# Patient Record
Sex: Male | Born: 1950
Health system: Southern US, Community
[De-identification: ages and names within clinical notes are randomized; demographics above are authoritative.]

## PROBLEM LIST (undated history)

## (undated) DIAGNOSIS — B1920 Unspecified viral hepatitis C without hepatic coma: Secondary | ICD-10-CM

## (undated) DIAGNOSIS — I509 Heart failure, unspecified: Secondary | ICD-10-CM

## (undated) DIAGNOSIS — D126 Benign neoplasm of colon, unspecified: Secondary | ICD-10-CM

## (undated) DIAGNOSIS — E785 Hyperlipidemia, unspecified: Secondary | ICD-10-CM

## (undated) DIAGNOSIS — I4510 Unspecified right bundle-branch block: Secondary | ICD-10-CM

## (undated) DIAGNOSIS — I441 Atrioventricular block, second degree: Secondary | ICD-10-CM

## (undated) DIAGNOSIS — M1990 Unspecified osteoarthritis, unspecified site: Secondary | ICD-10-CM

## (undated) DIAGNOSIS — I472 Ventricular tachycardia, unspecified: Secondary | ICD-10-CM

## (undated) DIAGNOSIS — Z950 Presence of cardiac pacemaker: Secondary | ICD-10-CM

## (undated) DIAGNOSIS — K254 Chronic or unspecified gastric ulcer with hemorrhage: Secondary | ICD-10-CM

## (undated) DIAGNOSIS — I2119 ST elevation (STEMI) myocardial infarction involving other coronary artery of inferior wall: Secondary | ICD-10-CM

## (undated) DIAGNOSIS — K7200 Acute and subacute hepatic failure without coma: Secondary | ICD-10-CM

## (undated) DIAGNOSIS — K7460 Unspecified cirrhosis of liver: Secondary | ICD-10-CM

## (undated) DIAGNOSIS — I2510 Atherosclerotic heart disease of native coronary artery without angina pectoris: Secondary | ICD-10-CM

## (undated) DIAGNOSIS — F1010 Alcohol abuse, uncomplicated: Secondary | ICD-10-CM

## (undated) DIAGNOSIS — K219 Gastro-esophageal reflux disease without esophagitis: Secondary | ICD-10-CM

## (undated) DIAGNOSIS — I10 Essential (primary) hypertension: Secondary | ICD-10-CM

## (undated) HISTORY — DX: Ventricular tachycardia, unspecified: I47.20

## (undated) HISTORY — DX: Atrioventricular block, second degree: I44.1

## (undated) HISTORY — DX: Unspecified osteoarthritis, unspecified site: M19.90

## (undated) HISTORY — DX: Atherosclerotic heart disease of native coronary artery without angina pectoris: I25.10

## (undated) HISTORY — PX: TOTAL KNEE ARTHROPLASTY: SHX125

## (undated) HISTORY — DX: Heart failure, unspecified: I50.9

## (undated) HISTORY — DX: Benign neoplasm of colon, unspecified: D12.6

## (undated) HISTORY — DX: Ventricular tachycardia: I47.2

## (undated) HISTORY — PX: KNEE ARTHROSCOPY: SUR90

## (undated) HISTORY — DX: Unspecified right bundle-branch block: I45.10

## (undated) HISTORY — DX: ST elevation (STEMI) myocardial infarction involving other coronary artery of inferior wall: I21.19

## (undated) HISTORY — DX: Hyperlipidemia, unspecified: E78.5

## (undated) HISTORY — DX: Essential (primary) hypertension: I10

---

## 2001-10-23 ENCOUNTER — Emergency Department (HOSPITAL_COMMUNITY): Admission: EM | Admit: 2001-10-23 | Discharge: 2001-10-24 | Payer: Self-pay | Admitting: Emergency Medicine

## 2001-10-23 ENCOUNTER — Encounter: Payer: Self-pay | Admitting: Emergency Medicine

## 2001-12-25 ENCOUNTER — Encounter: Payer: Self-pay | Admitting: Emergency Medicine

## 2001-12-25 ENCOUNTER — Inpatient Hospital Stay (HOSPITAL_COMMUNITY): Admission: EM | Admit: 2001-12-25 | Discharge: 2001-12-26 | Payer: Self-pay | Admitting: *Deleted

## 2003-01-21 ENCOUNTER — Inpatient Hospital Stay (HOSPITAL_COMMUNITY): Admission: EM | Admit: 2003-01-21 | Discharge: 2003-01-28 | Payer: Self-pay | Admitting: Emergency Medicine

## 2003-01-21 ENCOUNTER — Encounter (INDEPENDENT_AMBULATORY_CARE_PROVIDER_SITE_OTHER): Payer: Self-pay | Admitting: *Deleted

## 2003-01-21 ENCOUNTER — Encounter: Payer: Self-pay | Admitting: Emergency Medicine

## 2003-02-08 ENCOUNTER — Encounter: Payer: Self-pay | Admitting: Cardiothoracic Surgery

## 2003-02-10 ENCOUNTER — Encounter: Payer: Self-pay | Admitting: Cardiothoracic Surgery

## 2003-02-10 ENCOUNTER — Inpatient Hospital Stay (HOSPITAL_COMMUNITY): Admission: RE | Admit: 2003-02-10 | Discharge: 2003-02-19 | Payer: Self-pay | Admitting: Cardiothoracic Surgery

## 2003-02-10 HISTORY — PX: CORONARY ARTERY BYPASS GRAFT: SHX141

## 2003-02-10 HISTORY — PX: MEDIASTINAL EXPLORATION: SHX5065

## 2003-02-11 ENCOUNTER — Encounter: Payer: Self-pay | Admitting: Cardiothoracic Surgery

## 2003-02-12 ENCOUNTER — Encounter: Payer: Self-pay | Admitting: Cardiothoracic Surgery

## 2003-02-13 ENCOUNTER — Encounter: Payer: Self-pay | Admitting: Cardiothoracic Surgery

## 2003-02-15 ENCOUNTER — Encounter: Payer: Self-pay | Admitting: Thoracic Surgery (Cardiothoracic Vascular Surgery)

## 2003-02-17 ENCOUNTER — Encounter: Payer: Self-pay | Admitting: Cardiothoracic Surgery

## 2003-02-18 ENCOUNTER — Encounter: Payer: Self-pay | Admitting: Cardiothoracic Surgery

## 2003-05-27 ENCOUNTER — Encounter: Admission: RE | Admit: 2003-05-27 | Discharge: 2003-08-05 | Payer: Self-pay | Admitting: Cardiothoracic Surgery

## 2003-10-16 DIAGNOSIS — I2119 ST elevation (STEMI) myocardial infarction involving other coronary artery of inferior wall: Secondary | ICD-10-CM

## 2003-10-16 HISTORY — DX: ST elevation (STEMI) myocardial infarction involving other coronary artery of inferior wall: I21.19

## 2003-11-02 ENCOUNTER — Ambulatory Visit (HOSPITAL_COMMUNITY): Admission: RE | Admit: 2003-11-02 | Discharge: 2003-11-02 | Payer: Self-pay | Admitting: *Deleted

## 2003-11-02 ENCOUNTER — Encounter (INDEPENDENT_AMBULATORY_CARE_PROVIDER_SITE_OTHER): Payer: Self-pay | Admitting: *Deleted

## 2004-03-03 ENCOUNTER — Inpatient Hospital Stay (HOSPITAL_COMMUNITY): Admission: EM | Admit: 2004-03-03 | Discharge: 2004-03-11 | Payer: Self-pay | Admitting: *Deleted

## 2004-03-03 ENCOUNTER — Encounter (INDEPENDENT_AMBULATORY_CARE_PROVIDER_SITE_OTHER): Payer: Self-pay | Admitting: Cardiovascular Disease

## 2004-03-06 HISTORY — PX: CARDIAC CATHETERIZATION: SHX172

## 2004-03-20 ENCOUNTER — Inpatient Hospital Stay (HOSPITAL_COMMUNITY): Admission: AD | Admit: 2004-03-20 | Discharge: 2004-03-23 | Payer: Self-pay | Admitting: *Deleted

## 2004-06-08 ENCOUNTER — Ambulatory Visit (HOSPITAL_COMMUNITY): Admission: RE | Admit: 2004-06-08 | Discharge: 2004-06-08 | Payer: Self-pay | Admitting: Gastroenterology

## 2004-10-30 ENCOUNTER — Ambulatory Visit: Payer: Self-pay | Admitting: Gastroenterology

## 2005-01-18 ENCOUNTER — Ambulatory Visit: Payer: Self-pay | Admitting: Gastroenterology

## 2005-04-26 ENCOUNTER — Ambulatory Visit: Payer: Self-pay | Admitting: Internal Medicine

## 2005-05-24 ENCOUNTER — Ambulatory Visit: Payer: Self-pay | Admitting: Gastroenterology

## 2005-05-30 ENCOUNTER — Ambulatory Visit (HOSPITAL_COMMUNITY): Admission: RE | Admit: 2005-05-30 | Discharge: 2005-05-30 | Payer: Self-pay | Admitting: Obstetrics and Gynecology

## 2005-06-21 ENCOUNTER — Ambulatory Visit: Payer: Self-pay | Admitting: Internal Medicine

## 2005-07-19 ENCOUNTER — Ambulatory Visit: Payer: Self-pay | Admitting: Gastroenterology

## 2007-01-20 HISTORY — PX: US ECHOCARDIOGRAPHY: HXRAD669

## 2007-05-12 ENCOUNTER — Emergency Department (HOSPITAL_COMMUNITY): Admission: EM | Admit: 2007-05-12 | Discharge: 2007-05-12 | Payer: Self-pay | Admitting: Emergency Medicine

## 2009-04-05 HISTORY — PX: NM MYOCAR PERF WALL MOTION: HXRAD629

## 2010-03-15 HISTORY — PX: INSERT / REPLACE / REMOVE PACEMAKER: SUR710

## 2010-03-28 ENCOUNTER — Inpatient Hospital Stay (HOSPITAL_COMMUNITY): Admission: RE | Admit: 2010-03-28 | Discharge: 2010-03-29 | Payer: Self-pay | Admitting: Cardiology

## 2011-01-01 LAB — CBC
HCT: 46.3 % (ref 39.0–52.0)
Hemoglobin: 16 g/dL (ref 13.0–17.0)
MCV: 99 fL (ref 78.0–100.0)
Platelets: 55 10*3/uL — ABNORMAL LOW (ref 150–400)
WBC: 3.7 10*3/uL — ABNORMAL LOW (ref 4.0–10.5)

## 2011-01-01 LAB — PROTIME-INR: Prothrombin Time: 16.3 seconds — ABNORMAL HIGH (ref 11.6–15.2)

## 2011-01-01 LAB — BASIC METABOLIC PANEL
BUN: 5 mg/dL — ABNORMAL LOW (ref 6–23)
CO2: 24 mEq/L (ref 19–32)
Creatinine, Ser: 0.75 mg/dL (ref 0.4–1.5)
GFR calc non Af Amer: >60 mL/min (ref >60)
Glucose, Bld: 97 mg/dL (ref 70–99)
Sodium: 137 mEq/L (ref 135–145)

## 2011-01-01 LAB — SURGICAL PCR SCREEN: MRSA, PCR: NEGATIVE

## 2011-03-02 NOTE — Discharge Summary (Signed)
Hunter Carter, Hunter Carter                        ACCOUNT NO.:  [id number]   MEDICAL RECORD NO.:  [id number]                   PATIENT TYPE:  INP   LOCATION:  4729                                 FACILITY:  [facility]   PHYSICIAN:  Darlin Priestly, M.D.             DATE OF BIRTH:  09/13/1951   DATE OF ADMISSION:  01/21/2003  DATE OF DISCHARGE:  01/28/2003                                 DISCHARGE SUMMARY   ADMISSION DIAGNOSES:  1. Hypertensive crisis.  2. Unstable angina.  3. Congestive heart failure.  4. Medical noncompliance secondary to finances.  5. Thrombocytopenia.  6. History of alcoholism.  7. History of knee surgery x3.  8. History of cigarette smoking.   DISCHARGE DIAGNOSES:  1. Hypertensive crisis.  2. Unstable angina.  3. Congestive heart failure.  4. Medical noncompliance secondary to finances.  5. Thrombocytopenia.  6. History of alcoholism.  7. History of knee surgery x3.  8. History of cigarette smoking.  9. Status post cardiac catheterization by Dr. Lenise Herald on January 22, 2003, revealing high grade multivessel coronary artery disease and     ejection fraction 25 to 30%.  10.      High grade multivessel coronary artery disease status post cardiac     catheterization on January 22, 2003, by Dr. Lenise Herald.  Status post     cardiovascular thoracic surgery consultation by Dr. [name] on January 22, 2003, with plans for upcoming outpatient coronary artery bypass     grafting.  11.      Severe left ventricular dysfunction. Ejection fraction is 25 to     30%.  12.      Congestive heart failure felt to be secondary to uncontrolled     hypertension as well as ischemic mediated.  As well, there may be an     alcoholic component.  13.      Status post hematology consultation for evaluation of     thrombocytopenia.  Felt that it may have been secondary to sensitivity to     heparin.  It was felt to be stable.  Platelet count improved off of     heparin.  14.      Status post hepatology screen that was significant for positive     anti-HCV.  15.      History of alcoholism.  16.      Elevated liver function tests with an AST of 70 and ALT of 66.   HISTORY OF PRESENT ILLNESS:  The patient is a 60 year old African American  male who has a history of hypertension and history of heart failure but no  past coronary history.  He presented to the emergency room on January 21, 2003,  with complaints of shortness of breath and cough.  Apparently he had been  treated for bronchitis and then developed acute onset of shortness of breath  and  chest tightness with physical exertion.   Apparently, on the prior day, he had done a lot of yard work but was feeling  well at that time.  Later that evening, he had to chase his niece's dog that  had run away.  Apparently after chasing the dog, he developed severe  shortness of breath/dyspnea on exertion and chest tightness.   He subsequently came to the emergency room.  Original blood pressure had  been 214/138.  Heart rate was in the 90s to 100s.  Oxygen saturation was 96%  on room air.  He had very diminished breath sounds at the bases bilaterally.  EKG showed marked LVH with repolarization changes.  However, we could not  exclude ischemia versus repolarization change.  Subsequently his CK was  elevated and his troponin was mildly elevated.  At that time, he was seen  and evaluated by Dr. Lenise Herald.  He was planned for IV heparin, IV  nitroglycerin, aspirin, as well as medications to treat his severe  hypertension and plan for cardiac catheterization.  Risks and benefits of  the procedure were discussed and the patient was willing to proceed.   On January 22, 2003, the patient underwent cardiac catheterization by Dr.  Lenise Herald.  See the dictated report for details.  The patient was found  to have high grade multivessel coronary artery disease.  The EF was 25 to  30% with global hypokinesis.  There was  no renal artery stenosis.  At that  time, Dr. Jenne Campus obtained CVTS consultation for possible CABG.  He started  fenoldopam for further blood pressure control.  He changed Lopressor to  Coreg for both blood pressure control as well as treatment of his heart  failure and EF of 25%.  As well, he added digoxin given the LV dysfunction.   On January 22, 2003, the patient was seen in CVTS consultation by Dr. [name].  He did feel that CABG was a good option for treatment.  It was  noted that the patient had a history of thrombocytopenia of undetermined  etiology and he felt that we needed hematology consultation for evaluation  of thrombocytopenia prior to any plans for CABG.   On January 24, 2003, the patient was seen in hematology consultation by Dr.  Aliene Altes.  At that point, it was felt that alcohol abuse was the primary  etiology for the thrombocytopenia.  Subsequently, he wanted to rule out any  infection such as HIV and hepatitis.  In the meantime, he planned to avoid  all heparin products for now.  It was felt that surgical bleeding would be  minimized with platelet count greater than 100,000 but at that point would  not recommend steroid therapy for any possible autoimmune process given the  history of alcohol abuse as the main culprit.  He will continue to follow  until the issue resolved.  Please note that at that time the platelet count  had been the following.  On January 21, 2003, on admission, the platelet count  was 78,000.  On January 22, 2003, it was 82,000.  On January 23, 2003, it was  76,000.  On January 24, 2003, it was 98,000.  Subsequently, on January 25, 2003,  it came up to 109,000.  Subsequently, the HIV test was nonreactive.  The  hepatitis screen was positive for hepatitis C.   Over the next couple of days the patient continued to do well.  We continued  to adjust medications for  further blood pressure control.  On January 25, 2003, the blood pressures were running between  130 to 150 systolically and  80/80 to 104 diastolically so medications were further adjusted.  As well,  we continued to give him some increased diuretics for his heart failure.   Over the next days, the Lasix had been changed to p.o.  His blood pressure  was much better controlled.   We continued to improve these issues so that he would be medically stable to  undergo coronary artery bypass grafting.   Ultimately, we recontacted the cardiovascular thoracic surgeons.  It was  decided that the patient had been stable with no further chest pain  throughout the remainder of the hospitalization and that he would be stable  for discharge home and to return for coronary artery bypass grafting in the  very near future (probably next week).  The scheduler at CVTS is to contact  the patient with follow-up arrangement for return for CABG surgery.   Finally, by January 28, 2003, he is otherwise stable and is deemed stable for  discharge at this time by Dr. Susa Griffins.  At this point, he is  afebrile at 97.5, pulse 70, blood pressure 135/75, oxygen saturation 94% on  room air.  Labs are stable.  He has maintained sinus rhythm with no  arrhythmias.  It is felt that his blood pressure is well controlled.  His  heart failure is essentially resolved clinically.  His thrombocytopenia has  stabilized.  He will be discharged home with follow-up arrangements for  CVTS.   CONSULTATIONS:  1. Cardiovascular thoracic surgery consultation by Dr. [name] on January 22, 2003, for evaluation of high grade multivessel coronary artery disease     with need for coronary artery bypass grafting.  Please see above for his     other consultation details.  2. Hematology consultation by Dr. Aliene Altes on January 24, 2003, for     evaluation of thrombocytopenia.  It was felt that his past alcohol abuse     remained the primary etiology for the thrombocytopenia.  However, he did     want to rule out any  infection such HIV or hepatitis.  As well, he     planned to avoid all heparin products at this point.  Notably the HIV     became nonreactive.  Hepatitis screen was positive for hepatitis C.   PROCEDURES:  1. Cardiac catheterization on December 22, 2002, by Dr. Lenise Herald revealing     multivessel high grade coronary artery disease.  EF 25 to 30% with global     hypokinesis.  No renal artery stenosis.  See the catheterization report     for details.  2. January 25, 2003, he underwent carotid Duplex.  This shows bilateral mild     heterogenous plaque of the common carotid artery extending to the origins     of the ICA and ECA.  No evidence of ICA stenosis.  Vertebral artery flow     antegrade.  The upper extremity showed PPD wave forms diminished     approximately 50% with radial compression and remained normal with ulnar     compression on the right.  Wave forms diminished greater than 50% with    radial compression, remained normal with ulnar compression on the left.  3. ABIs and Doppler wave forms were normal at rest.   LABORATORY DATA:  HIV is nonreactive.  Hepatitis panel shows hepatitis B  surface antigen negative, anti-HCV is positive, anti-HAV IgM is negative,  hepatitis B negative.  On admission cardiac enzymes showed CK 715, 566; CK-  MB 7.9, 7.1; troponin 0.10 and 0.06.  B natriuretic peptide was 325.0 on  admission.  On admission, liver function tests were normal except he did  have an AST elevated at 70 and an ALT elevated at 56.   On admission, sodium 136, potassium 3.1 which was repleted up to 3.6,  glucose 109, BUN 13, creatinine 1.1.  These all remained stable throughout  the hospitalization.  He had no rise in creatinine even post catheterization  dye.  On admission white count was 4.3, hemoglobin 15.6, hematocrit 45.5,  platelets 78.  On January 22, 2003, platelets are 68.  On January 23, 2003,  platelets are 28.  On January 24, 2003, platelets are 98.  On January 25, 2003,   platelets are 109.  The remainder of the CBC __________ the hospitalization.   RADIOLOGY:  Chest x-ray on January 21, 2003, shows cardiomegaly with probably  mild interstitial edema, no effusion.   A 2-D echocardiogram  on January 21, 2003, shows  EF 40 to 45%.  Trivial aortic  regurgitation.   EKG on admission shows normal sinus rhythm at 78 beats per minute with  severe LVH.  There were changes that could possibly be repolarization  changes but could not rule out ischemic changes.   DISCHARGE MEDICATIONS:  1. Enteric coated aspirin 325 mg once a day.  2. Digoxin 0.125 mg once a day.  3. Altace 10 mg twice a day.  4. Norvasc 10 mg once a day.  5. Lasix 40 mg once a day.  6. K-Dur 40 mEq once a day.  7. Coreg 6.25 mg twice a day.  8. Imdur 30 mg once a day.  9. Nitroglycerin 0.4 mg sublingual as directed for chest pain.  10.      At this time we were going to treat him with statin therapy but I     am actually not going to give him any statin therapy at this time given     his elevated liver function tests.   At this time the case managers and myself have spent an extensive amount of  time trying to work on his plans for medication compliance and financial  issues at the time of discharge home.  He is going to our office to pick up  the samples that we have.  Case manager is giving him $50 worth of medicines  here at the discharge.  He is to follow up with the [facility] and with  medication assistance programs.   If he has any significant chest pain, he is to either call [phone] or go  directly to [facility] Emergency Room.  As well, he has been given  nitroglycerin and has been instructed on proper use of that.   He is to have no strenuous activity until he has his bypass surgery.  The  cardiovascular thoracic surgeons office is to contact him with follow-up  plans regarding his upcoming bypass surgery.  I have given him their office phone number to contact in the event that he  does not hear from them in the  next day.   As well, I have spoken with Dr. Mikey Bussing nurse at the office and she is to  contact the patient early next week to make sure that he does have follow-up  arrangements made for bypass surgery.  As well,  to be sure that he  definitely has follow-up, I have made him an appointment for Feb 23, 2003,  at 12:15 to see Dr. Jenne Campus.     Mary B. Easley, P.A.-C.                   Darlin Priestly, M.D.    MBE/MEDQ  D:  01/28/2003  T:  01/28/2003  Job:  [id number]   cc:   [name], M.D.  [address]  [place]  [place] [address]  Fax: [phone]   Mikey Bussing, M.D.  [address]  [place]  [place] [address]  Fax: [phone]   [facility]

## 2011-03-02 NOTE — Cardiovascular Report (Signed)
Hunter Carter, Hunter Carter                        ACCOUNT NO.:  [id number]   MEDICAL RECORD NO.:  [id number]                   PATIENT TYPE:  INP   LOCATION:  4727                                 FACILITY:  [facility]   PHYSICIAN:  Hunter Carter, M.D.             DATE OF BIRTH:  [birth year]   DATE OF PROCEDURE:  03/08/2004  DATE OF DISCHARGE:                              CARDIAC CATHETERIZATION   PROCEDURES:  Dual-chamber pacemaker implant using a Medtronic Enrhythm  generator with active atrial and passive ventricular leads.   ATTENDING PHYSICIAN:  Hunter Carter, M.D.   ASSISTANT:  Hunter Carter, M.D.   INDICATIONS:  Hunter Carter is a 60 year old male with history of ischemia  cardiomyopathy recently admitted with unstable angina.  The patient has a  history of bypass surgery in April 2004 consisting of LIMA to LAD, vein  graft to diagonal and jump graft from the vein graft to the diagonal to an  obtuse marginal.  He also has a history of hepatitis C and alcohol abuse as  well as __________.  He was admitted on Mar 03, 2004 with increasing  shortness of breath and positive enzymes.  Subsequent cardiac  catheterization revealed totally occluded vein graft to the obtuse marginal.  We have elected to treat this medically.  He has had intermittent secondary  heart block during this hospitalization prompting [place] to hold many of his  antiischemic medications.  He is now referred for dual-chamber pacemaker  implant.   DESCRIPTION OF OPERATION:  After obtaining informed written consent, the  patient was brought to the cardiac catheterization laboratory in fasting  state.  The left anterior chest wall was then shaved, prepped and draped in  sterile fashion.  ECG monitor was established.  1% lidocaine was then used  to anesthetize the left subclavian region.  Next, approximately a 3 cm  incision was then performed in the midclavicular line beneath the left  clavicle.  Hemostasis was  obtained with electrocautery.  Blunt dissection  was used to carry this down to the left pectoral fascia.  Approximately 3-4  cm pocket was then created over the left pectoral fascia and again  hemostasis was obtained with electrocautery.  We then were able to easily  enter the left subclavian vein with single stick with easy passage of a  guide wire into the SVC and right atrium.  Next, a #9 [place] dilator and  sheath were then easily inserted over the retained guide wire and the guide  wire and dilator then removed.  Through the 9 French sheath a 52 cm  Medtronic ventricular lead model number [id number], serial number [id number] lead  was then easily passed into the right atrium and the guide wire was then  retained.  The peel-away sheath removed.  A second 9 French dilator and  sheath were then placed over the retained guide wire and the guide wire and  dilator were removed.  A second 45 cm Medtronic active atria lead model  number [id number], serial number [id number] was then easily passed into the right  atrium and the guide wire was retained and peel-away sheath removed.  The  guide wire was then anchored to the sheath with hemostat.  A J-curve was  then placed on the ventricular lead stylet and the ventricular lead was then  allowed to prolapse into the RA apex.  We did do multiple threshold testings  in the RV apex.  However, impedances were running consistently above 1000  with thresholds approximately 5 V.  Ultimately, we were able to select a  position just beneath the tricuspid valve which appeared stable.  R waves in  this location were approximately 12.6 mV.  Impedance was 772 ohms.  Thresholds were 0.3 V at 0.5 msec.  Current was 0.5 milliamps.  There was  negative diaphragmatic stimulation at 10 V.  Next, a preformed atrial stylet  was then placed in the atrial lead and the atrial lead was then positioned  in the right atrial appendage and the screw was then extended and confirmed.  P  waves were measured at 5.6 mV.  Threshold in the atrium is 1.2 V, 0.5  msec.  Impedance was 621 ohms.  Current was 1.9 milliamps.  There was no  diaphragmatic stimulation at 10 V.  Each of these lesions were then sutured  into place with two silk sutures per lead.  A single 2.0 silk suture was  then placed in the apex of the pocket for anchoring the generator.  Again,  hemostasis was confirmed with electrocautery.  The pocket was then copiously  irrigated with 1% kanamycin solution.  The generator was then connected in  serial fashion to an Enrhythm [id number] Medtronic generator serial [id number]  and head screws were then tightened on the atrial ventricular leads and  pacing was confirmed.  The generator and leads were then delivered into the  pocket without complication.  The generator was then sutured in place using  the previously placed suture.  All remaining wires were removed.  The  subcutaneous tissue was then closed using running 2-0 Vicryl.  The skin  layers were then closed using running 5-0 Vicryl.  Steri-Strips were then  applied.  The patient transferred to recovery room in stable condition.   CONCLUSION:  Successful implant of a Medtronic Enrhythm [id number] generator,  serial number [id number] with active atrial and passive ventricular  Medtronic leads.                                               Hunter Carter, M.D.    RHM/MEDQ  D:  03/10/2004  T:  03/11/2004  Job:  [id number]   cc:   Hunter Carter, M.D.  [phone], [address]  [place]  [place] [address]  Fax: [phone]

## 2011-03-02 NOTE — Cardiovascular Report (Signed)
NAMEJOHNSTON, MADDOCKS                        ACCOUNT NO.:  [id number]   MEDICAL RECORD NO.:  [id number]                   PATIENT TYPE:  INP   LOCATION:  2013                                 FACILITY:  [facility]   PHYSICIAN:  Darlin Priestly, M.D.             DATE OF BIRTH:  01-Apr-1951   DATE OF PROCEDURE:  03/21/2004  DATE OF DISCHARGE:                              CARDIAC CATHETERIZATION   PROCEDURES:  Pacer pocket revision with pacer interrogation.   ATTENDING PHYSICIAN:  Darlin Priestly, M.D.   COMPLICATIONS:  None.   INDICATIONS:  Mr. Vanvranken is a 60 year old male with history of coronary  artery disease status post coronary artery bypass surgery with history of  intermittent secondary AV block.  The patient underwent permanent pacemaker  placement on Mar 10, 2001 by Dr. Alanda Amass and Dr. Jenne Campus which was  uncomplicated. He was seen back in the office on March 20, 2004 with  increasing swelling and tenderness at the pacer site.  There was no evidence  of inflammation though he was having minimal slow oozing from the incision  site.  He was subsequently admitted for planned pocket exploration and  hematoma evacuation.   DESCRIPTION OF OPERATION:  After obtaining informed written consent, the  patient was brought to the cardiac catheterization laboratory in fasting  state.  Left anterior chest was then shaved, prepped and draped in sterile  fashion.  1% Lidocaine was then used to anesthetize the previous incision  site.  The sutures were then cut and blunt dissection was used to dissect  down to the pocket.  The previously placed 2-0 and 5-0 sutures were then  removed.  A moderate amount of dark hematoma was then expressed from the  pocket. The generator was then freed and the retaining sutures then removed.  The generator was then delivered from the pocket.  Again, moderate amount of  hematoma was expressed from beneath the previous pacer site.  The pocket was  then swabbed  for gram stain, culture and sensitivity and this was sent to  the lab.  The pocket was then copiously irrigated with 1% kanamycin solution  and was again inspected.  There was no obvious active bleeding site though  there was persistent slow ooze from the pocket.  We then anesthetized the  entire pocket as well as the pectoral area with 1% lidocaine with  epinephrine.  We also injected 5000 units of thrombin topically to allow for  hemostasis.  This was then removed.  Again, the pocket was inspected and  appeared to be dry.  The pacer was then interrogated while it was removed  from the pocket.  Atrial lead impedance was 528 ohms.  Ventricular lead  impedance was 480 ohms.  P waves were measured at 7.1 mV.  R waves were  approximately 9 mV.  Threshold in the atrium was 0.5 V at 0.5 milliseconds.  Threshold in the ventricle was 0.5 V  at 0.4 milliseconds.  The generator was  then delivered back into the pocket, and again, hemostasis was confirmed.  The subcutaneous layer was then closed using running 2-0 Vicryl.  The skin  layer was then closed using subcutaneous  5-0 running Vicryl.  Steri-Strips were then applied.  Pressure dressing was  then applied to the pacer site and the patient was then removed from the  table and transferred to recovery room in stable condition.   CONCLUSION:  Successful pacemaker pocket revision with removal of moderate  amount of thrombus.  There was no evidence of infection or pus in the  pocket.                                               Darlin Priestly, M.D.   RHM/MEDQ  D:  03/21/2004  T:  03/22/2004  Job:  [id number]

## 2011-03-02 NOTE — Discharge Summary (Signed)
. [facility]  Patient:    Hunter Carter, Hunter Carter Visit Number: [id number] MRN: [id number]          Service Type: MED Location: [phone] Attending Physician:  [place], Peter Manning Dictated by:   Peter M. [place], M.D. Admit Date:  12/25/2001 Discharge Date: 12/26/2001   CC:         [name], M.D., [facility]   Discharge Summary  HISTORY OF PRESENT ILLNESS:  The patient is a 60 year old black male with a history of severe hypertension who presents with a 1-week history of cough (nonproductive) with intermittent fever.  The patient reports that his cough has been so severe that he has passed out five times while coughing.  [facility] where chest x-ray showed a question of congestive heart failure. He has no prior cardiac disease.  He reports having a stress Cardiolite study 4-5 years ago which was normal.  He has no chest pain.  The patient received nebulizer therapy in the ambulance and coughed up thick yellowish-green sputum.  His past medical history is significant for hypertension (poorly controlled).  He has had prior knee surgery.  He has no known allergies. The patient has been a smoker approximately 1 pack per week.  He does have family history of vascular disease.  PAST MEDICAL HISTORY/SOCIAL HISTORY/FAMILY HISTORY AND PHYSICAL EXAMINATION: Please see admission history and physical.  LABORATORY DATA:  Chest x-ray shows increased bronchial markings consistent with bronchitis and reactive airway disease.  ECG shows normal sinus rhythm with LVH and prominent repolarization abnormality and left atrial enlargement. White count was 5800, hemoglobin 15.3, hematocrit 44.9, platelets were at 73,000.  Chemistries were unremarkable except for a potassium of 3.3.  CPK was 1727 with 3.1 MB, troponin was 0.11.  Urinalysis was benign.  HOSPITAL COURSE:  The patient was admitted.  He was treated with nebulizer therapy with q.6h.  Atrovent and albuterol nebulizers.  He was started on guaifenesin and L.A.  He was begun on antibiotics with Zithromax.  The patient had improvement in his wheezing and cough.  He did have one episode where he felt like he passed out while coughing but telemetry showed no arrhythmia.  He was treated with Altace and clonidine for his severe hypertension, had significant improvement in his blood pressure which was normal at time of discharge.  ECG showed improvement in his repolarization abnormality. Subsequent CPKs were 1203 with 2.5 MB and 1065, troponin declined to 0.07. These enzymes were felt to be more consistent with skeletal muscle injury probably due to his severe coughing.  An echocardiogram was obtained which demonstrated moderate concentric left ventricular hypertrophy with normal left ventricular systolic function.  There was moderate left atrial enlargement. Valvular function was normal.  Repeat CBC the following day showed persistence of thrombocytopenia with platelet count of 74,000, white count was 4400.  His BUN was 12, creatinine 1.2, glucose 109.  Brain natriuretic peptide was normal at 57.  TSH was normal at 0.593.  Total cholesterol was 154, HDL of 29, LDL 108, triglycerides 84.  With aggressive pulmonary treatment, the patients breathing improvement, his wheezing had largely resolved although he continued to have significant congestion on exam. It was felt that his symptoms were clearly noncardiac related.  His blood pressure was well controlled.  We did replete his potassium prior to discharge.  At the time of discharge he still had a sputum culture pending and SPEP.  At this point it was felt the patient was  stable for discharge and he was discharged to home.  DISCHARGE DIAGNOSES: 1. Acute bronchitis. 2. Acute bronchospasm secondary to #1. 3. Severe hypertension. 4. Left ventricular hypertrophy. 5. Thrombocytopenia of unknown etiology. 6. Tobacco abuse. 7. Cough  mediated syncope.  DISCHARGE MEDICATIONS: 1. Combivent inhaler two puffs q.i.d. 2. Humibid L.A. 600 mg twice a day. 3. Zithromax 250 mg daily for 4 days. 4. Altace 5 mg daily. 5. Clonidine 0.2 mg twice a day.  DISCHARGE INSTRUCTIONS:  The patient is to resume light activity only, follow a low-salt, low-fat diet.  He is told not to smoke.  Recommended followup with Dr. Salvadore Farber next week who can follow up on his bronchitis and thrombocytopenia as an outpatient.  No further cardiac evaluation is needed.  DISCHARGE STATUS:  Improved. Dictated by:   Peter M. [place], M.D. Attending Physician:  [place], Peter Manning DD:  12/26/01 TD:  12/29/01 Job: [id number]

## 2011-03-02 NOTE — Consult Note (Signed)
NAME:  Hunter Carter, Hunter Carter                        ACCOUNT NO.:  [id number]   MEDICAL RECORD NO.:  [id number]                   PATIENT TYPE:  INP   LOCATION:  4729                                 FACILITY:  [facility]   PHYSICIAN:  Merilynn Finland, M.D.                DATE OF BIRTH:  01-Jun-1951   DATE OF CONSULTATION:  01/24/2003  DATE OF DISCHARGE:                                   CONSULTATION   HISTORY OF PRESENT ILLNESS:  Ms. Hellinger is a very pleasant 60 year old  gentleman who was admitted 01/21/2003, in hypertensive crisis with a history  of congestive heart failure and some element of noncompliance with anti-  hypertensive medications and dietary restrictions presenting mainly with  shortness of breath with some element of mild chest tightness that was  nonradiating.  He was worked up extensively and at the time of this  admission was found to have a blood pressure of 195/127.  At the time of his  admission, he was found to be thrombocytopenic with platelet counts around  80,000.  He has a known alcohol use history and the consult today is for  thrombocytopenia in the setting of coronary artery disease with need for  revascularization procedure.   PAST MEDICAL HISTORY:  1. Hypertension.  2. Congestive heart failure.  3. History of alcohol abuse with elevated transaminases.  4. Hyperkalemia.  5. Status post knee surgery x3.  6. History of cigarette smoking.   FAMILY HISTORY:  Positive for coronary artery disease.   SOCIAL HISTORY:  Alcohol abuse and tobacco abuse.   ALLERGIES:  No known allergies.   MEDICATIONS:  Altace and clonidine for high blood pressure but he has been  noncompliant.   REVIEW OF SYSTEMS:  Shortness of breath, mild chest pain.  No bowel or  bladder changes.  No focal neurological complaints.  No history of bleeding.   PHYSICAL EXAMINATION:  VITAL SIGNS:  He is currently afebrile.  Vital signs  are stable.  CHEST:  Clear.  HEART:  Regular rate and  rhythm.  ABDOMEN:  Soft, nontender, no hepatosplenomegaly.  EXTREMITIES:  Range of motion normal.  No edema.  NEUROLOGICAL:  Alert and oriented x3.  Cranial nerves II-XII grossly intact  globally.   LABORATORY DATA:  Labs show initially a platelet count around 80,000 with a  subsequent drop to 76,000 with heparin administered, currently off heparin.  He has rebounded to 98,000.  On smear review, he has some cytopenia.  There  are no other abnormalities noted.  Other labs showed a white count of 5.7,  hemoglobin of 15.0.  His admission transaminases showed an SGOT of 70, SGPT  66.   IMPRESSION:  Thrombocytopenia in this 60 year old gentleman with a history  of alcohol abuse and severe hypotension.  At this point, I think alcohol  abuse remains his primary etiology for the thrombocytopenia.  Certainly, we  want to rule out infection such  as HIV and hepatitis.  In the meantime, I  would avoid all heparin products for now.  Surgical bleeding will be  minimized with platelets greater than 100,000 and at this point would not  recommend steroid therapy for any possible autoimmune process given the  history of alcohol abuse as the main culprit.  We will continue to follow  closely until the issue is resolved.                                               Merilynn Finland, M.D.    JSS/MEDQ  D:  01/24/2003  T:  01/24/2003  Job:  [id number]

## 2011-03-02 NOTE — H&P (Signed)
[place]  Patient:    Hunter Carter, Hunter Carter Visit Number: [id number] MRN: [id number]          Service Type: MED Location: [phone] Attending Physician:  [place], Peter Manning Dictated by:   Peter M. [place], M.D. Admit Date:  12/25/2001   CC:         [name], M.D.   History and Physical  CHIEF COMPLAINT: Shortness of breath and cough.  HISTORY OF PRESENT ILLNESS: Hunter Carter is a 60 year old black male, with a history of severe hypertension, who presents with a one week history of a nonproductive cough and fever.  He has had no chills.  His cough has been fairly extreme at times and so severe that the patient has passed out on five occasions this week while coughing.  The patient went to [facility] today and chest x-ray showed question of congestive heart failure, and was referred to the emergency room.  The patient has no known prior history of cardiac disease.  He has had poorly controlled hypertension.  He stopped taking his blood pressure medicine for several years, taking herbal until more recently he was started on Clonidine and Altace approximately three months ago.  The patient does report that he had a negative stress Cardiolite study four to five years ago.  He has had no chest pain.  After receiving nebulizer therapy in the ambulance the patient is now coughing up thick yellowish/green sputum.  PAST MEDICAL HISTORY:  1. Severe hypertension.  2. Status post knee surgery x3.  ALLERGIES: None known.  CURRENT MEDICATIONS:  1. Altace 2.5 mg q.d.  2. Clonidine, unknown dose.  3. Also on a cough medicine with codeine.  SOCIAL HISTORY: The patient previously worked for a [employer].  He has been unemployed for six months.  He smoke one pack per week.  He denies alcohol use.  FAMILY HISTORY: Brother died of a heart aneurysm and cancer.  Father had hypertension.  Sister died with some typical of aneurysm, he does  not know what.  REVIEW OF SYSTEMS: As is as per HPI.  PHYSICAL EXAMINATION:  GENERAL: The patient is a young-appearing black male in no apparent distress.  VITAL SIGNS: Temperature 100.7 degrees, blood pressure 160/110, pulse 98 and regular, respirations 24.  HEENT: Unremarkable.  Oropharynx clear.  NECK: Supple without JVD, adenopathy, thyromegaly, or bruits.  LUNGS: Diffuse expiratory wheezes with poor air movement.  CARDIAC: Regular rate and rhythm without murmurs, rubs, gallops, or clicks.  ABDOMEN: Soft, nontender.  Bowel sounds positive.  EXTREMITIES: Without edema.  Pulses 2+ and symmetric.  NEUROLOGIC: Intact.  LABORATORY DATA: Chest x-ray shows increased bronchial markings and increased airway markings consistent with known bronchitis and reactive airway disease. His cardiac size is borderline.  ECG shows normal sinus rhythm with LVH and prominent repolarization changes, left atrial enlargement.  WBC 5800, hemoglobin 15.3, hematocrit 44.9, platelets 73,000.  Potassium was 3.3.  CK was 1727 with 3.1 MB.  UA is pending.  IMPRESSION:  1. Acute bronchospasm secondary to acute bronchitis.  2. Severe hypertension.  3. Abnormal electrocardiogram consistent with left ventricular hypertrophy     and repolarization abnormality.  4. Thrombocytopenia.  5. Cough mediated syncope.  6. Elevated CPK consistent with rhabdomyolysis, probably due to his     extensive coughing.  PLAN:  1. The patient will be admitted to telemetry.  2. Will obtain serial cardiac enzymes.  3. He will be treated with Atrovent and albuterol nebulizer therapy.  4.  Will be started on Zithromax p.o..  5. Will be given Humibid L.A. 600 mg b.i.d.  6. Tussionex p.r.n.  7. Will obtain an echocardiogram.  8. Will follow up platelet count in the morning.  9. Also obtain TSH and lipid panel. Dictated by:   Peter M. [place], M.D. Attending Physician:  [place], Peter Manning DD:  12/25/01 TD:   12/26/01 Job: [id number]

## 2011-03-02 NOTE — Discharge Summary (Signed)
NAMEKADIR, AZUCENA                        ACCOUNT NO.:  [id number]   MEDICAL RECORD NO.:  [id number]                   PATIENT TYPE:  INP   LOCATION:  2025                                 FACILITY:  [facility]   PHYSICIAN:  Kerin Perna, M.D.               DATE OF BIRTH:  [birth year]   DATE OF ADMISSION:  02/10/2003  DATE OF DISCHARGE:  02/19/2003                                 DISCHARGE SUMMARY   DISCHARGE DIAGNOSES:  1. History of exertional angina with episode of congestive heart failure     related to hypertensive crisis early in April of 2004.  2. Severe three vessel atherosclerotic coronary artery disease with left     heart catheterization showing the left anterior descending or the LAD 50%     stenosis after the third diagonal. The first diagonal had an 80% proximal     stenosis. The second diagonal had an 80% proximal stenosis. The third     diagonal had a tendon stenosis of 60 and 50%. The left circumflex and     three acute marginal's. The first had a 70% proximal. The second had     tandem stenosis of 80 and 70%. The distal stenosis had a 90% stenosis     before the third marginal, which itself, had a 90% proximal stenosis. The     right coronary artery had no significant stenosis. Left ventricular     function was 25-30% with global hypokinesis.  3. Coagulopathy status post coronary artery bypass grafting surgery     requiring re-exploration for bleeding and at that time, four packs of     platelets were given, four units of fresh frozen plasma, and four units     of packed red blood cells all on the day of surgery.  4. Volume overload postoperatively, resolving with gentle diuresis.  5. Mild encephalopathy postoperatively, resolving, probably secondary to     decreased hepatic function.  6. Persistent left pleural effusion. Ultrasound thoracentesis of the left     chest was considered but cancelled due to the probable difficulty of     completing the procedure.   SECONDARY DIAGNOSES:  1. Hepatic insufficiency with history of hepatitis C.  2. Uncontrolled hypertension.  3. History of thrombocytopenia, secondary to hepatic     dysfunction/hypertension.  4. Long term continued tobacco habituation.  5. Status post left knee surgery times three for torn cartilage.   PROCEDURE:  1. February 10, 2003 - Coronary artery bypass grafting surgery times three by     Dr. Kathlee Nations Trigt, Surgeon. In this procedure, the left internal     mammary artery was connected in an end-to-side fashion to the left     anterior descending coronary artery. Reversed saphenous vein grafts were     fashioned from the aorta to the third obtuse marginal and also, to the     diagonal.  2. April 28,2004 -  Mediastinal re-exploration for bleeding and setting of     coagulopathy by Dr. Kathlee Nations Trigt, Surgeon.   DISPOSITION:  Mr. Klingel is ready for discharge on postoperative day nine.  At this time, his mental status is clearing. He did have a prolonged period  of lethargy postoperatively and confusion which resolved mainly by  postoperative day eight. He did have mild abdominal distention noted on  postoperative day two but this ileus resolved on it's own by postoperative  day five. Mr. Kamath was also oxygen dependent in the postoperative  period. Finally weaned off O2 with marginal O2 saturations of 91% on room  air by postoperative day seven. He had volume overload during the  postoperative period and has obtained at that time of discharge, his  preoperative weight with gentle, continued diuresis. Mr. Collard's  incisions in the chest and the right leg are healing well. He is ambulating  independently, although his gait is somewhat unsteady. He had some balance  problems in the postoperative period. He is taking nourishment well and is  ready for discharge on Feb 19, 2003 to go home on the following medications.   DISCHARGE MEDICATIONS:  1. Enteric coated aspirin 325 mg  one by mouth daily.  2. Protonix 40 mg daily.  3. Digoxin 0.125 mg daily.  4. Lopressor 50 mg one half tab in the morning and one half tab in the     evening.  5. Lasix 40 mg daily.  6. Potassium chloride 20 mEq daily.  7. Altace 2.5 mg daily.  8. Multivitamin daily.  9. Combivent meter dose inhaler two puffs every four hours.  10.      Folic acid 400 mcg daily.  11.      Ultram 50 mg 1-2 tabs every six hours as needed pain.   DISCHARGE ACTIVITIES:  Walk daily to build up strength. He is asked not to  drive until he sees Dr. Donata Clay in the office. He is not to lift more than  10 pounds nor to engage in twisting or pushing exercises for six weeks.   DIET:  Low sodium, low cholesterol diet.   SPECIAL INSTRUCTIONS:  Mr. Soderman may shower. He is to call Dr. Zenaida Niece  Trigt's office if his incision becomes inflamed, starts to drain or becomes  painful.   FOLLOW UP:  He has an office visit with Dr. Lenise Herald two weeks after  discharge. He is to call [phone] to arrange for this appointment and he  will see Dr. Donata Clay on Friday, Mar 12, 2003 at 12:15 p.m. He is to get a  chest x-ray at [facility] one hour before the appointment  with Dr. Donata Clay.   HISTORY OF PRESENT ILLNESS:  Mr. Macke is a 60 year old African-American  male who presents to Dr. Kathlee Nations Trigt for further discussion of recently  diagnosed severe coronary artery disease with reduced left ventricular  function. Re-vascularization surgery has been recommended by Dr. Jenne Campus and  Dr. Laneta Simmers during a recent hospitalization from April 7th to January 28, 2003.  At that time, he was admitted for unstable angina as well as congestive  heart failure and uncontrolled hypertension. The patient has high grade  stenosis in the circumflex, the first obtuse marginal and the third obtuse  marginal. He has moderate stenosis of the LAD diagonal with non-significant disease in the right coronary artery.  Surgical re-vascularization was  recommended to control symptoms of angina, to preserve left ventricular  function and  to improve long term survival. The patient presents for  elective coronary artery bypass grafting surgery on February 10, 2003.   HOSPITAL COURSE:  As described above, Mr. Noxon underwent coronary artery  bypass grafting surgery times three on February 10, 2003. He was taken back to  the OR for mediastinal re-exploration for bleeding in the setting of  coagulopathy the same day. His postoperative progress has lasted for nine  days and has included a  mild ileus, prolonged O2 dependence, volume  overload, and confusion and lethargy with balance problems in the  postoperative  period. All of these problems have resolved or are resolving to a great  extent. He has had no cardiac dysrhythmia in the postoperative period and  his incisions are healing nicely. He will have follow-up with both Dr.  Jenne Campus and Dr. Donata Clay as dictated above.     Maple Mirza, P.A.                    Kerin Perna, M.D.    GM/MEDQ  D:  02/18/2003  T:  02/20/2003  Job:  [id number]   cc:   Darlin Priestly, M.D.  [phone] [address]  [place]  [place] [address]  Fax: [phone]

## 2011-03-02 NOTE — Consult Note (Signed)
Hunter Carter, Hunter Carter                        ACCOUNT NO.:  [id number]   MEDICAL RECORD NO.:  [id number]                   PATIENT TYPE:  INP   LOCATION:  2311                                 FACILITY:  [facility]   PHYSICIAN:  [name], M.D.                  DATE OF BIRTH:  [birth year]   DATE OF CONSULTATION:  DATE OF DISCHARGE:                                   CONSULTATION   DATE OF CONSULTATION:  January 17, 2003.   REFERRING PHYSICIAN:  Darlin Priestly, M.D.   REASON FOR CONSULTATION:  1. Three-vessel coronary artery disease.  2. Acute congestive heart failure secondary to hypertensive crisis.   HISTORY OF PRESENT ILLNESS:  This patient is a 60 year old black male with a  history of severe hypertension and previous congestive heart failure episode  secondary to severe hypertension who was admitted with acute onset of  shortness of breath and chest tightness after exertion.  He was noted to  have severe high blood pressure measuring 214/138.  He had stopped taking  his blood pressure medication about one month ago after he lost his  insurance.  The patient reports intermittent episodes of substernal chest  pressure and pain that are usually short lived and resolve without  treatment.  These occur most of the time with exertion.  The patient remains  quite active and says that he rides a bicycle for about 45 minutes to an  hour 3-4 times a week.  He used to be an Academic librarian.  He denies any shortness  of breath on an ongoing basis.  He said he has noted no change in his energy  level.  On admission his CPK was 715 with an MB Of 7.9 and a troponin I of  0.10.  Electrocardiogram showed lateral T wave changes suggesting possible  ischemia.  The differential was felt to be congestive heart failure versus  ischemia.  He underwent cardiac catheterization today by Dr. Jenne Campus which  showed three-vessel coronary artery disease.  The LAD had moderate  irregularities with 50% mid dorsal  stenosis beyond the third diagonal  branch.  The first diagonal had 80% proximal stenosis.  The second diagonal  around 80% proximal stenosis.  The third diagonal had 60-50% stenoses.  The  left circumflex gave off three marginal branches. The first had 70% proximal  stenosis and the second had 80 and 70% stenoses.  The distal left circumflex  had 90% stenosis before a third marginal branch which had about 90%  stenosis.  The right coronary artery was enlarged, irregular, ectatic muscle  with no significant stenoses limit.  Left ventricular ejection fraction was  about 25-30% with global hyperkinesis.  There is no renal artery stenosis.  There was no gradient across the aortic valve.  There was no mitral  regurgitation.   PAST MEDICAL HISTORY:  1. Significant  for severe high blood pressure as mentioned above.  2. He has a history of thrombocytopenia of unclear etiology.  3. He is status post three knee surgeries on the left knee in the past for     torn cartilage.   REVIEW OF SYSTEMS:  GENERAL:  He denies any recent weight changes.  He  denies any fever or chills.  EYES:  Negative.  ENT:  Negative.  ENDOCRINE:  He does not have diabetes, hypothyroidism.  CARDIAC:  As above.  He denies any paroxysmal nocturnal dyspnea and  orthopnea.  He denies peripheral edema.  RESPIRATORY:  He did have some  cough and feeling like he had some mucus in his throat with this episode of  congestive heart failure.  GI:  He has had no nausea or vomiting.  He denies melena but did have some  small amount of bright red blood per rectum about two weeks ago which he  thought was hemorrhoids.  GU:  He denies dysuria and hematuria.  MUSCULOSKELETAL:  He denies myalgias.  He has some arthritis in his left  knee.  PSYCHIATRIC:  Negative.  ALLERGIES:  None.  NEUROLOGICAL:  He denies any focal weakness or numbness.  He has had some  dizziness and did have an episode of cough related dizziness and syncope   last year.   FAMILY HISTORY:  Positive for cardiovascular disease and hypertension.   SOCIAL HISTORY:  He is married and has five children.  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  He continues to smoke but is down to about two  cigarettes per day.  He occasionally drinks beer.   MEDICATIONS:  1. At the time of admission were aspirin 81 mg daily.  2. Altace 2.5 mg daily.  3. Clonidine 0.1 mg b.i.d.  4. Multivitamin daily.   PHYSICAL EXAMINATION:  VITAL SIGNS:  His blood pressure is 130/70 and his  pulse is 75 and regular.  Respiratory rate is 16 non labored.  Oxygen  saturation on 2 liters is 100%.  GENERAL:  He is a well developed, muscular black male in no distress.  HEENT:  Shows him to be normocephalic, atraumatic.  The pupils equal and  reactive to light and accomodation.  Extraocular muscles are intact.  His  throat is clear.  NECK:  Shows normal  carotid pulses bilaterally.  There are no bruits.  There is no adenopathy or thyromegaly.  CARDIAC:  Shows a regular, rate and rhythm with a normal  S1 and S2.  There  is no murmur, rub or gallop.  LUNGS:  Clear.  ABDOMEN:  Shows active bowel sounds.  His abdomen is soft and nontender.  There are no palpable masses or organomegaly.  EXTREMITIES:  Shows no peripheral edema.  Pedal pulses are palpable  bilaterally.  NEUROLOGIC:  Shows he is alert and oriented  x 3.  Motor and sensory are  grossly normal .  SKIN:  Warm and dry.   ADMITTING LABORATORIES:  Electrolytes on admission were significant  for a  potassium of 3.1, BUN 13 and creatinine of 1.1.  His transaminases were  mildly elevated with an SGOT of 70 and an SGPT of 66.  Albumin was 2.9.  Total bilirubin was 1.0.  White blood cell count was 4.3, hemoglobin 15.6,  platelet count of 78,000.   Chest x-ray on admission showed a cardiomegaly with interstitial edema.   IMPRESSION: 1. This patient has three-vessel artery disease, presenting with acute      congestive heart failure secondary to hypertensive crisis.  His left     circumflex diagonal stenoses are significant .  It is unclear whether     these are causing him active ischemia.  The LAD and right coronary artery     have mild stenoses but no significant  narrowing.  I think coronary     artery bypass graft surgery is an option for treating his coronary     disease, although he may still have episodes of congestive heart failure     if he does not take his medications and has hypertensive crises.  I     discussed coronary bypass surgery with him including alternatives, common     benefits, common risks.  He would like to discuss it further with Dr.     Jenne Campus and we will then make a decision which treatment option     to proceed with.  2. He does have thrombocytopenia of unclear etiology and this will need work     up prior to any cardiac surgery with a hematology consult.                                                 [name], M.D.    BB/MEDQ  D:  01/22/2003  T:  01/23/2003  Job:  [id number]   cc:   [name], M.D.  [address]  [place]  [place] [address]  Fax: [phone]   Darlin Priestly, M.D.  [phone] [address]  [place]  [place] [address]  Fax: [phone]

## 2011-03-02 NOTE — Op Note (Signed)
NAMELUNDEN, MCLEISH                        ACCOUNT NO.:  [id number]   MEDICAL RECORD NO.:  [id number]                   PATIENT TYPE:  INP   LOCATION:  2308                                 FACILITY:  [facility]   PHYSICIAN:  Kerin Perna III, M.D.           DATE OF BIRTH:  23-Dec-1950   DATE OF PROCEDURE:  02/10/2003  DATE OF DISCHARGE:                                 OPERATIVE REPORT   OPERATION:  Mediastinal reexploration for bleeding.   PREOPERATIVE DIAGNOSIS:  Mediastinal bleeding status post coronary artery  bypass graft x3.   POSTOPERATIVE DIAGNOSIS:  Mediastinal bleeding status post coronary artery  bypass graft x3.   SURGEON:  Kerin Perna, M.D.   ANESTHESIA:  General.   INDICATIONS FOR PROCEDURE:  The patient is a 60 year old male with chronic  thrombocytopenia and hepatic insufficiency from chronic hepatitis C who had  coronary bypass grafting earlier in the day. He had persistent chest tube  drainage of 200 mL an hour following surgery and was returned to the  operating room for reexploration. Prior to returning to the operating room,  I discussed the patient's condition with his wife and reviewed the  indications and rationale for returning to the operating room for  reexploration. The patient's wife understood and agreed with the planned  procedure.   DESCRIPTION OF PROCEDURE:  The patient was brought directly from the ICU  back to the operating room and he remained hemodynamically stable. General  anesthesia was induced and the chest, abdomen and upper legs were prepped  with Betadine and draped as a sterile field. The previous sternal incision  was opened and the sternal wires were removed. There was a minimal amount of  blood and thrombus in the mediastinum. The mammary artery and vein grafts  were carefully examined and were hemostatic and patent. The cannulation  sites were examined and were hemostatic. There was diffuse oozing from the  mediastinal  fat, chest wall and sternum. These were controlled with  electrocautery. There was some bleeding from the pericardial fat which was  controlled with electrocautery and a hemoclip. There was some chest wall  bleeding at the mammary artery harvest site which was controlled with  electrocautery. The wound was irrigated with warm vancomycin irrigation and  the chest tubes were replaced. The incision was then closed after hemostasis  was adequate. Platelet transfusion and packed cell transfusion were  administered in the operating room for a low platelet count and anemia. The  pericardium was loosely reapproximated. The sternum was reapproximated with  interrupted steel wire. The pectoralis fascia was closed with a running  Vicryl. The subcutaneous and skin were closed with a running Vicryl and  sterile dressings were applied. The patient was transported to the ICU in  stable condition.  Mikey Bussing, M.D.   PV/MEDQ  D:  02/10/2003  T:  02/11/2003  Job:  [id number]   cc:   [facility]

## 2011-03-02 NOTE — H&P (Signed)
NAME:  Hunter Carter, Hunter Carter                        ACCOUNT NO.:  [id number]   MEDICAL RECORD NO.:  [id number]                   PATIENT TYPE:  EMS   LOCATION:  MAJO                                 FACILITY:  [facility]   PHYSICIAN:  Jackie Plum, M.D.             DATE OF BIRTH:  Jan 23, 1951   DATE OF ADMISSION:  01/21/2003  DATE OF DISCHARGE:                                HISTORY & PHYSICAL   PROBLEM LIST:  1. Hypertension crisis.     a. Patient presented with diastolic blood pressure of more than 120 mmHg        (158 mmHg) with complaints of chest tightness, shortness of breath.        He has bilateral crackles with chest x-ray findings of congestive        heart failure consistent with hypertensive crisis.  2. Congestive heart failure.  3. Thrombocytopenia, etiology unclear.     a. Possibly related to alcoholism in view of concurrent transaminitis        with SGOT of 70 and SGPT of 68.     b. Platelet count of 110,000 and __________  to 74,000 in January 2003        and in March 2003, respectively.     c. No mucosal bleeding.  4. Hypokalemia with admission potassium of 3.1.  5. History of hypertension.  6. Status post knee surgery x3.  7. History of cigarette smoking.   CHIEF COMPLAINT:  Shortness of breath.   HISTORY OF PRESENT ILLNESS:  The patient is a 60 year old African American  gentleman with prior history of hypertension, noncompliant to his  antihypertensive medications and dietary restriction, who presents with  complaints of shortness of breath since yesterday.  In addition, he has some  mild chest tightness which is nonradiating.  He has been coughing up grayish-  white sputum.  No history of palpitations.  He has mild orthopnea without  any paroxysmal nocturnal dyspnea.  He denies any history of fever or chills.  He denies any history of nausea or diaphoresis.   The patient is hypertensive and has not been compliant to his Altace and  clonidine.  According to the  patient, he is not able to afford these  medications since losing his insurance.   PAST MEDICAL HISTORY:  Past medical history is as noted in HPI above.  He  had been told earlier that he has an enlarged heart, but I am not sure if he  has been diagnosed with heart failure prior to this admission.  He denies  any history of diabetes or high cholesterol.   MEDICATION HISTORY:  He usually takes Altace and clonidine for his high  blood pressure but has been unable to afford these medications for some time  now.   ALLERGIES:  He does not have any known drug allergies.   SOCIAL HISTORY:  He previously worked for a [employer] but has lost  his job since last year.  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  He drinks alcohol  occasionally.   FAMILY HISTORY:  Family history is __________  for history of aneurysm.   REVIEW OF SYSTEMS:  He denies any history of sore throat, sinus drainage,  headache, fainting or loss of consciousness.  He denies any visual  disturbance, any dysuria or frequency of micturition.  He denies any  abdominal pain or diarrhea or constipation.  No skin rash or extremity or  joint pains.   PHYSICAL EXAMINATION:  VITAL SIGNS:  BP of 195/127 mmHg (initial blood  pressure about one hour earlier was 214/138 mmHg).  Heart rate is 97 per  minute.  Respiratory rate is 24 per minute.  O2 saturation is 95% on oxygen  at 2 L/min by nasal cannula.  Temperature is 98.0 degrees Fahrenheit.  GENERAL:  General examination is notable for a middle-aged African American  gentleman lying on a stretcher.  He was not in acute cardiopulmonary  distress.  HEENT:  He was not pale, not icteric.  Pupils were equal, round and reactive  to light.  The oropharynx was moist without any oropharyngeal erythema or  exudation.  NECK:  Neck was supple.  No JVD was appreciated.  He does not have any  thyromegaly.  LUNGS:  Exam is  notable for vesicular breath sounds which were decreased at  the bases with bilateral crackles at the bases.  No wheezes were  appreciated.  CARDIAC:  Exam had a regular rate and rhythm without any gallops or murmur.  ABDOMEN:  Abdomen was full without any tenderness.  No hepatosplenomegaly  was appreciated.  EXTREMITIES:  He was not cyanotic.  He did not have any edema.  There was no  clubbing.  CNS:  He was alert, oriented x3 without any other focal deficits.   LABORATORY WORK:  EKG showed sinus rhythm at 98 per minute with biatrial  enlargement and left ventricular hypertrophy.  He had a strain pattern.   X-ray showed cardiomegaly with edema consistent with CHF.   WBC count was 4.3, hemoglobin 15.6, hematocrit 45.5, MCV 92.8, platelet  count 78,000.  PT 14.9, INR 1.2, PTT 29.  Sodium 136, potassium 3.1,  chloride 105, CO2 27, glucose 109, BUN 13, creatinine 1.1, calcium 8.2,  total protein 7.8, albumin 2.9, AST 70, ALT 66, alkaline phosphatase 79,  total bilirubin 1.0.  Total CPK 715.   ASSESSMENT:  [age]-year-old African American gentleman presenting with  shortness of breath and chest tightness associated with bilateral crackles  on exam with edema on chest x-ray.  Initial admitting emergency department  blood pressure notable for diastolic blood pressure of more than 120 mmHg.  The patient definitely has a hypertensive crisis, in view of his admitting  blood pressure diastolic of more than 120 mmHg with end-target organ  features.   PLAN:  The plan is to admit the patient to a stepdown unit, titrate his  nitroglycerin drip to target blood pressure of diastolic blood pressure  between 90 to 100 mmHg.  Should there be no improvement with nitrate,  Vasotec IV may be considered.  We will obtain rule-out MI protocol with  serial cardiac enzymes.  We will check his 2-D  echocardiogram and also BNP.  We will start him on aspirin and continue with Altace with oxygen  supplementation and IV diuresis.  The patient will need  care management/social worker followup regarding  his current financial  situation and if any assistance can be given to him regarding medications on  discharge.                                                Jackie Plum, M.D.    GO/MEDQ  D:  01/21/2003  T:  01/22/2003  Job:  [id number]   cc:   Ermalene Searing. Leander Rams, M.D.  [address] [place] [address]  Fax: [phone]

## 2011-03-02 NOTE — Discharge Summary (Signed)
NAME:  Hunter Carter, Hunter Carter NO.:  [id number]   MEDICAL RECORD NO.:  [id number]                   PATIENT TYPE:  INP   LOCATION:  2013                                 FACILITY:  [facility]   PHYSICIAN:  Darlin Priestly, M.D.             DATE OF BIRTH:  1951/07/26   DATE OF ADMISSION:  03/20/2004  DATE OF DISCHARGE:  03/23/2004                                 DISCHARGE SUMMARY   HISTORY OF PRESENT ILLNESS:  [name] is a 60 year old, African  American, male patient who underwent PTVDP on Mar 10, 2004, secondary to  heartblock.  He came into the office for complications on March 20, 2004.  He  was having bloody discharge from the incision site.  He was seen by Darlin Priestly, M.D., in the office, who tried to express some drainage, which  he did.  Thus, he was admitted for pocket exploration and evacuation.   HOSPITAL COURSE:  His admission laboratories were without any significant  abnormalities.  He underwent pocket exploration on March 21, 2004, by Darlin Priestly, M.D.  The hematoma was evacuated.  The pocket was swabbed for  Gram's stain and culture and sensitivity.  The area was irrigated with  kanamycin.  There was no obvious active bleeding.  There was a persistent  slow ooze from the pocket.  The area was anesthetized with 1% lidocaine with  epinephrine.  They injected 5000 units of thrombin topically to allow for  hemostasis.  Then it was removed.  The pocket appeared to be dry.  The pacer  was interrogated and then reimplanted.  There was no evidence of infection  or pus in the pocket.  On the morning of March 22, 2004, the wound culture  showed no growth.  He was kept here another day.  Per Dr. Mikey Bussing request,  no aspirin, Plavix, Lovenox, or heparin were to be given.  On the morning of  March 23, 2004, he was seen by Dr. Jacinto Halim and considered stable to be  discharged home.  His blood pressure was 104/58, his heart rate was 70, and  his  temperature was 97.5 degrees.  He was walking in the halls without any  problems.   LABORATORIES:  On March 22, 2004, his hemoglobin was 15.1, hematocrit 44.4,  WBC 3.9, and platelets 149.  The INR was 1.1 and his PTT was 25.  His chest  x-ray showed negative chest for active disease.  As stated before,  microbiology was negative for any pathology of the wound culture at this  time.   DISCHARGE DIAGNOSES:  1. Pacer wound complication with hematoma formation.  2. Status post explantation of his generator with evacuation of a moderate     size hematoma.  No pus seen.  Area irrigated.  Lidocaine, epinephrine,     and thrombin injected, then irrigated, and pacer generator replaced     without any perioperative  or postoperative complications.  3. Ischemic cardiomyopathy.  4. Chronic systolic heart failure.  5. History of thrombocytopenia.  6. Hyperlipidemia.  7. Hepatitis C with elevated liver enzymes.  8. Ejection fraction 25-35%.  9. Coronary artery disease with a history of coronary artery bypass     grafting.   DISCHARGE MEDICATIONS:  1. Imdur 30 mg one time per day.  2. Avapro 300 mg one time per day.  3. Folic acid 400 mg one time per day.  4. Lasix 40 mg one time per day.  5. K-Dur 40 mEq one time per day.  6. Toprol XL 50 mg one time per day.   He had been on Norvasc when he was in the hospital, but not on as an  outpatient.  I decided to discontinue this because his blood pressure was  actually on the low side plus Norvasc can cause additional thrombocytopenia  and leukopenia.   SPECIAL INSTRUCTIONS:  He is not to take aspirin or Plavix until see by Dr.  Jenne Campus.  No over the counter aspirin products.  No Advil or ibuprofen.  He  can take Tylenol for discomfort.   ACTIVITY:  The patient is to do no lifting and no pushing, pulling, or  reaching with his left arm.   DIET:  He should be on a no added salt diet and eat only low-sodium foods.   WOUND CARE:  He should keep  the area dry x 4 days and wash with soap and  water and pat it dry.   FOLLOWUP:  He will follow up with Dr. Jenne Campus on March 30, 2004, at 30 p.m.      Lezlie Octave, N.P.                        Darlin Priestly, M.D.    BB/MEDQ  D:  03/23/2004  T:  03/24/2004  Job:  [id number]   cc:   Dala Dock

## 2011-03-02 NOTE — Cardiovascular Report (Signed)
Hunter Carter, Hunter Carter                        ACCOUNT NO.:  [id number]   MEDICAL RECORD NO.:  [id number]                   PATIENT TYPE:  INP   LOCATION:  3312                                 FACILITY:  [facility]   PHYSICIAN:  Darlin Priestly, M.D.             DATE OF BIRTH:  10-09-1951   DATE OF PROCEDURE:  01/22/2003  DATE OF DISCHARGE:                              CARDIAC CATHETERIZATION   PROCEDURES PERFORMED:  1. Left heart catheterization.  2. Coronary angiography.  3. Left ventriculogram.  4. Bilateral renal angiograms.   CARDIOLOGIST:  Darlin Priestly, M.D.   COMPLICATIONS:  None.   INDICATIONS FOR PROCEDURE:  The patient is a 60 year old male admitted on  January 21, 2003 with hypertensive crisis.  He does have a history of  longstanding hypertension, but has been off his medications for several  weeks secondary to monetary reasons.  He also had a complaint of substernal  chest pressure upon admission.  His EKG on admission revealed LVH with  repolarization changes; however, he did have lateral ST segment depression  consistent with ischemia.  He is now brought for cardiac catheterization to  access the coronary status.   DESCRIPTION OF PROCEDURE:  After obtaining informed consent the patient was  brought to the cardiac cath lab where the right and left groins were shaved,  prepped and draped in the usual sterile fashion.  ECG monitoring was  established.  Using modified Seldinger technique a #6 French arterial sheath  was inserted in the right femoral artery.  Six French diagnostic catheters  were used to perform diagnostic angiography.   RESULTS:   ANGIOGRAPHY:  Left Main Coronary Artery:  This revealed a large left main  with no significant disease.   Left Anterior Descending:  The LAD is a large vessel that courses the apex  and gives rise to three diagonal branches.  The LAD is irregular with a  sequential 50% lesion in the mid segment beyond the third  diagonal.  The  first diagonal is a medium size vessel with an 80% ostial lesion.  The  second diagonal is a medium size vessel with 80% ostial lesion.  The third  diagonal is a large vessel which bifurcates distally and has a 60% ostial  and sequential 50-60% midvessel lesion.   Left Circumflex:  The left circumflex is a large vessel that courses the AV  groove and give rise to three obtuse marginal branches.  AV groove  circumflex is noted to have a 60% stenotic lesion at the second obtuse  marginal and 90% distal lesion just prior to the takeoff of the third obtuse  marginal with significant disease extending into the third OM.  The first OM  is a large vessel with 70% ostial lesion.  The second OM is a medium size  vessel with 80% ostial and 70% proximal lesion.  The third OM is a large  vessel with 90% proximal  stenosis.   Right Coronary Artery:  The right coronary artery is a large vessel, which  is dominant and gives rise to both the PDA as well as the posterolateral  branch.  The RCA is irregular, but has no high-grade stenosis.  The PDA is a  large vessel with no significant disease.  The PLA is a large vessel that  bifurcates distally and has a 60% midvessel lesion.   LEFT VENTRICULOGRAPHY:  Left ventriculogram reveals a severely depressed EF  at 25-30% with global hypokinesis.   RENAL ANGIOGRAM:  Bilateral renal angiograms revealed no evidence of renal  artery stenosis.   HEMODYNAMICS:  Systemic arterial pressure 127/95.  LV systemic pressure  127/7.  LVEDP of 18.   CONCLUSION:  1. Significant two-vessel coronary artery disease.  2. Severely depressed left ventricular systolic function.  3. Systemic hypertension.  4. No evidence for renal artery stenosis.                                                 Darlin Priestly, M.D.    RHM/MEDQ  D:  01/22/2003  T:  01/22/2003  Job:  [id number]   cc:   Ermalene Searing. Leander Rams, M.D.  [address] [place] [address]  Fax:  [phone]

## 2011-03-02 NOTE — Op Note (Signed)
NAMEHASAAN, RADDE                        ACCOUNT NO.:  [id number]   MEDICAL RECORD NO.:  [id number]                   PATIENT TYPE:  INP   LOCATION:  2308                                 FACILITY:  [facility]   PHYSICIAN:  Kerin Perna III, M.D.           DATE OF BIRTH:  June 01, 1951   DATE OF PROCEDURE:  02/10/2003  DATE OF DISCHARGE:                                 OPERATIVE REPORT   PREOPERATIVE DIAGNOSES:  1. Class III angina and congestive heart failure.  2. Severe two-vessel coronary artery disease with reduced left ventricular     function.   POSTOPERATIVE DIAGNOSES:  1. Class III angina and congestive heart failure.  2. Severe two-vessel coronary artery disease with reduced left ventricular     function.   PROCEDURE:  Coronary artery bypass grafting x3 (left internal mammary artery  to the left anterior descending coronary artery, saphenous vein graft to  first diagonal, saphenous vein graft to obtuse marginal 3).   SURGEON:  Kerin Perna, M.D.   ASSISTANT:  Coral Ceo, P.A.   ANESTHESIA:  General by Belva Agee, M.D.   INDICATIONS:  The patient is a 60 year old black male who presented with  chest pain, uncontrolled hypertension, and ruled in for MI.  Cardiac  catheterization by Darlin Priestly, M.D., demonstrated severe two-vessel  coronary disease with ejection fraction of 25-30%.  He was stabilized on  medical therapy and referred for surgical revascularization.  Prior to  surgery I examined the patient in the office and reviewed the results of the  cardiac catheterization with the patient and his wife.  I discussed the  indications and expected benefits of coronary bypass surgery for treatment  of his coronary artery disease.  I reviewed the alternatives to surgical  therapy as well for the patient.  I discussed with the patient the major  aspects of the proposed procedure, including the location of the surgical  incisions, the use of general  anesthesia and cardiopulmonary bypass, the  choice of conduit for grafting, and the expected postoperative hospital  recovery period.  I discussed with the patient the impact of his  longstanding thrombocytopenia would have on his postoperative bleeding risk  as well as the requirement for postoperative blood product transfusion.  We  also discussed the impact of his long smoking history on potential  postoperative issues such as pneumonia, oxygen requirements, prolonged  ventilator requirements, and prolonged hospitalization.  I also reviewed  with the patient the other risks to him of this operation, including risks  of MI, CVA, wound infection, and death.  He understood these implications  for the surgery and agreed to proceed with the operation as planned under  what I felt was an informed consent.   OPERATIVE FINDINGS:  The patient has massive cardiomegaly from previously  poorly-controlled hypertension.  His coronaries were intramyocardial.  The  OM-1 vessel was very high in the AV groove and was  not accessible for  grafting.  The OM-3 vessel was diffusely diseased and grafted.  The diagonal  2 was diffusely calcified and diseased and was not grafted.  He had overall  moderate to severe hypocontractility of the left ventricle.  The patient was  given a platelet transfusion in the operating room for thrombocytopenia and  a platelet count in the OR of 72,000.   DESCRIPTION OF PROCEDURE:  The patient was brought to the operating room and  placed supine on the operating room table, where general anesthesia was  induced under invasive hemodynamic monitoring.  The chest, abdomen, and legs  were prepped with Betadine and draped as a sterile field.  A sternal  incision was made as the saphenous vein was harvested from the right upper  leg using the endoscopic vein technique.  The left internal mammary artery  was harvested as a pedicle graft from its  origin at the subclavian vessels  and  was a small vessel but had adequate flow.  Heparin was administered and  the ACT was documented as being therapeutic.  A sternal retractor was  placed.  The pericardial cradle was created.  Through pursestrings placed in  the ascending aorta and right atrium, the patient was cannulated and placed  on bypass and cooled to 32 degrees.  The coronaries were identified for  grafting and the mammary artery and vein grafts were prepared for the distal  anastomoses.  A cardioplegia catheter was placed in the ascending aorta and  into the right atrium into the coronary sinus for both antegrade and  retrograde delivery of cold blood cardioplegia.  The patient was then cooled  to 30 degrees.  As the aortic crossclamp was applied, a total of 750 mL of  cold blood cardioplegia was delivered in split doses between the antegrade  aortic and retrograde coronary sinus catheters.  There was good cardioplegic  arrest and septal temperature dropping to less than 15 degrees.  Topical  iced saline slush was used to augment myocardial preservation and a  pericardial insulator pad was used to protect the left phrenic nerve.   The distal coronary anastomoses were then performed.  The first distal  anastomosis was to the first diagonal.  This is a heavily-diseased 1.5 mm  vessel with proximal 80% stenosis.  A reversed saphenous vein was sewn end-  to-side with running 7-0 Prolene with good flow through the graft.  The  second distal anastomosis was to the OM-3.  This was a heavily-diseased  vessel with a proximal 95% stenosis and was a 1.5 mm vessel.  A reversed  saphenous vein was sewn end-to-side with running 7-0 Prolene with good flow  through the graft.  The third distal anastomosis was to the midportion of  the LAD.  This was heavily calcified and with a thickened wall.  A mammary  artery graft was constructed using a running 8-0 Prolene.  The mammary artery pedicle was brought through an opening created in the  left lateral  pericardium and was brought down onto the LAD and sewn end-to-side with a  running 8-0 Prolene.  There was good flow through the anastomosis with rise  in septal temperature after release of the pedicle clamp on the mammary  artery.  The mammary pedicle was secured to the epicardium and the aortic  crossclamp was removed.   The heart resumed a spontaneous rhythm.  Using a partial occluding clamp,  the vein graft to the diagonal was sewn on the ascending aorta  using a 4.0  mm punch and running 6-0 Prolene.  The partial clamp was removed and the  vein graft was perfused.  The vein graft to the OM-3 was then sewn to the  proximal portion of the vein graft to the diagonal due to inadequate length  of the OM-3 vein graft.  The vein graft had to be trimmed due to a severe  thickening and sclerosis of its wall, and no further vein was adequate was  available.  The vein-to-vein anastomosis was performed by using a running 7-  0 Prolene and using small vascular bulldog clamps, which were removed, and  the vein grafts were perfused.  There was excellent flow through this Y-  graft fashioned proximal venous anastomosis.  The mammary graft to the LAD  and distal vein grafts were checked for hemostasis.  The patient was warmed  to 37 degrees.  Temporary pacing wires were applied.  The lungs were re-  expanded and the ventilator was resumed.  The patient was  then weaned from  bypass without difficulty on renal-dose dopamine.  Cardiac output and blood  pressure were stable.  Protamine was administered.  There was no adverse  reaction to the protamine.  The cannulas were removed.  The mediastinum was  irrigated with warm antibiotic irrigation.  The leg incision was irrigated  and closed in a standard fashion.  The pericardium was closed superiorly  over the aortic vein grafts.  Two mediastinal and a left pleural chest tube  were placed and brought out through separate incisions.  The  sternum was  closed with interrupted steel wire.  The pectoralis fascia was closed in a  running #1Vicryl.  The subcutaneous and skin were closed with a running  Vicryl.  Total bypass time was 170 minutes.  A Jackson-Pratt drain was left  in the right thigh incision due to some oozing in the thigh compartment.                                               Mikey Bussing, M.D.    PV/MEDQ  D:  02/10/2003  T:  02/11/2003  Job:  [id number]   cc:   Darlin Priestly, M.D.  [phone], [address]  [place]  [place] [address]  Fax: [phone]

## 2011-03-02 NOTE — Cardiovascular Report (Signed)
NAMEJERRARD, Hunter Carter                        ACCOUNT NO.:  [id number]   MEDICAL RECORD NO.:  [id number]                   PATIENT TYPE:  INP   LOCATION:  4727                                 FACILITY:  [facility]   PHYSICIAN:  Darlin Priestly, M.D.             DATE OF BIRTH:  11/17/1950   DATE OF PROCEDURE:  03/06/2004  DATE OF DISCHARGE:                              CARDIAC CATHETERIZATION   PROCEDURES:  1. Left heart catheterization.  2. Coronary angiography.  3. Left ventriculogram.  4. Ascending aortography.  5. Saphenous vein graft.  6. Left internal mammary angiography.   ATTENDING PHYSICIAN:  Darlin Priestly, M.D.   COMPLICATIONS:  None.   INDICATIONS:  Hunter Carter is a 60 year old male patient of Dr. Glenice Laine at [facility] with history of ischemic cardiomyopathy  status post coronary artery bypass surgery in April 2004 consisting of LIMA  to LAD, vein graft to diagonal and a jump vein graft to obtuse marginal.  The patient also has history of hepatitis C, alcohol abuse and  noncompliance.  He was readmitted on Mar 03, 2004 with shortness of breath  and chest pain.  He was noted to be markedly hypertension on admission.  He  did have a mildly elevated troponin at 0.07.  He is now referred for repeat  cardiac catheterization to reassess his grafts.   DESCRIPTION OF OPERATION:  After obtaining informed written consent, the  patient was brought to the cardiac catheterization laboratory.  Right and  left groin were  shaved and prepped and draped in the usual sterile fashion.  ECG monitor was established.  Using the modified Seldinger technique, a 6  French arterial sheath was inserted in the right femoral artery. A 6 French  diagnostic catheter was then used to performed diagnostic angiography.  This  reveals a large left main with no significant disease.  The LAD is a large  vessel that coursed to the apex and gives rise to one large diagonal  branch.  The LAD has 60% lesion after the take off of the first diagonal and a 70%  mid LAD stenosis.  There is competitive flow in the mid and distal LAD.  There is a patent LIMA which in mid portion of the LAD which has no  significant disease.   The diagonal is a medium size vessel with an 80% proximal and mid stenosis.  There is patent saphenous vein graft to the diagonal and no significant  disease distal to the vein graft.  By the op report note, there does appear  to be a graft originating from the vein graft to the diagonal.  However,  this is not visualized and appears to be totally occluded.   Left circumflex is a medium size vessel which courses in the AV groove and  gives rise to three obtuse marginal branches.  The AV groove circumflex is  noted to have 50% mid, 95% stenosis  after the take off of the second OM,  diffuse 70% distal and 95% stenosis in the distal AV circumflex extending  into the third obtuse marginal.   The first OM is a medium size vessel which bifurcates in the mid segment and  has no significant disease.   The second OM is a small vessel with 99% ostial and 90% proximal lesion.  The third OM is a medium size vessel with 95% ostial lesion.   The right coronary artery is a large vessel which is dominant and gives rise  to both PDA as well as posterior lateral branch.  The RCA is coarsely  irregular, but has no high grade stenosis.  PDA and posterior lateral branch  are both irregular.  There is 70% mid PLA lesion.   LEFT VENTRICULOGRAM:  Left ventriculogram reveals mildly depressed EF of 40-  45% with inferior apical akinesis.   HEMODYNAMICS:  Systemic arterial pressure 137/92, LV pressure 135/9, LVEDP  of 17.   CONCLUSIONS:  1. Significant three-vessel coronary artery disease.  2. Mildly depressed left ventricular systolic function.  3. Patent left internal mammary artery to the left anterior descending with     no significant disease in the left  anterior descending beyond the vein     graft.  4. Patent vein graft to the diagonal.  5. Totally occluded vein graft to the obtuse marginal.  6. Systemic hypertension.                                               Darlin Priestly, M.D.    RHM/MEDQ  D:  03/06/2004  T:  03/07/2004  Job:  [id number]

## 2011-03-02 NOTE — Discharge Summary (Signed)
NAMESHEPPARD, LUCKENBACH                        ACCOUNT NO.:  [id number]   MEDICAL RECORD NO.:  [id number]                   PATIENT TYPE:  INP   LOCATION:  4727                                 FACILITY:  [facility]   PHYSICIAN:  Darlin Priestly, M.D.             DATE OF BIRTH:  03-23-1951   DATE OF ADMISSION:  03/03/2004  DATE OF DISCHARGE:  03/11/2004                                 DISCHARGE SUMMARY   DISCHARGE DIAGNOSES:  1. Acute onchronic systolic heart failure, also diastolic heart failure,     resolved.  2. Chronic systolic heart failure with cardiomyopathy, ejection fraction of     25-30%.  3. A 2:1 atrioventricular block.     A. Permanent pacemaker placed Mar 10, 2004, by Dr. Jenne Campus.  4. Thrombocytopenia.  5. Coronary disease.  History of bypass grafting x3, now with disease in     vessels not bypassed.  6. Abnormal CK-MB, mildly elevated.  7. History of noncompliance secondary to financial issues.  8. Hepatitis C with abnormal liver function tests.     A. No statin secondary to hepatitis C.   DISCHARGE CONDITION:  Improved.   PROCEDURES:  1. On Mar 06, 2004:  Combined left heart cath with graft visualization by     Dr. Lenise Herald.  EF at that time was 40-45%.  2. On Mar 10, 2004:  Permanent pacemaker Medtronic bipolar DDD pacemaker     inserted by Dr. Lenise Herald, left subclavian area without     complications.   DISCHARGE MEDICATIONS:  1. Imdur 30 mg once daily.  2. Folic acid 400 mg daily.  3. Aspirin, 2 baby aspirin daily.  4. Stop Lanoxin.  5. Avapro 300 mg once a day.  6. Furosemide 40 mg daily.  7. Albuterol inhalers as needed.  8. Nitroglycerin sublingual as needed.  9. Toprol XL 15 mg once daily.  10.      Stop Altace.  11.      Plavix 75 mg 1 daily beginning March 15, 2004.   DISCHARGE INSTRUCTIONS:  1. Do not raise left arm above shoulder height for five days.  2. Do not get pacer site wet for five days, then may shower and pat dry.  3. No  strenuous activity.  No lifting of greater than 5 pounds for three     days.  No working until you see Dr. Jenne Campus.  4. Low-cholesterol, low-fat diet.  5. Have blood work done March 15, 2004, on the first floor of Dr. Mikey Bussing     office.  6. Wash right groin site with soap and water.  Call if any bleeding or     swelling or drainage.  7. Call us if any swelling, bleeding at the pacemaker site.  8. See Dr. Jenne Campus for office visit in the next 7-10 days.  Also will call     you with appointment date and time.   HISTORY OF PRESENT  ILLNESS:  A 60 year old African-American male was  admitted to [facility] by Dr. Jacinto Halim for Dr. Jenne Campus on Mar 03, 2004,  secondary to pain in his right arm, chest pain and shortness of breath.  He  was at work, and he had associated symptoms of diaphoresis.  He took  nitroglycerin with improvement in his symptoms.  He went home, lay down,  called the office and was instructed to come to the emergency room.  In the  ER during evaluation, he was sore in his left chest area.  Right arm was a  little numb and has been that way since his bypass grafting.  He has had  rehab secondary to nerve damage to that arm.  His shortness of breath was  some better.  He had had a lot of congestive cough the week prior to  admission.  Patient was admitted to [facility] with acute onchronic  systolic heart failure, chest pain rule out MI and some abnormal EKG  changes.  Plans were for Cardiolite study and then probable discharge.   PAST MEDICAL HISTORY:  Coronary disease with bypass grafting x3 in April of  2004 by Dr. Donata Clay with a LIMA to the LAD, saphenous vein graft to the  first diagonal and saphenous vein graft to OM3.  Postprocedure he had to go  back to the OR for re-exploration for bleeding the same day.  He also had an  EF of 25-30%.  No renal artery stenosis.  History of noncompliance.  History  of thrombocytopenia.  Dr. Merilynn Finland had seen him for  hematology, and it  was felt that alcohol was the primary etiology of his thrombocytopenia.  History of hypertension.  Knee surgery x3.  History of tobacco use but none  now.  History of alcohol use, none now.  He also was diagnosed with  hepatitis C and had a biopsy.  That was through Dr. Salley Scarlet.   OUTPATIENT MEDS:  Folic acid, aspirin, digoxin, Altace that he had stopped,  Avapro 150 daily, __________ 40 daily, Lopressor 50 daily and albuterol.   He was having difficulty obtaining his meds secondary to financial issues.   ALLERGIES:  No known allergies.   FAMILY HISTORY, SOCIAL HISTORY AND REVIEW OF SYSTEMS:  See H&P, except that  with social history it is noted he is having a difficult time working or  works part-time.  Evaluation needed for disability.   PHYSICAL EXAM AT DISCHARGE:  VITAL SIGNS:  Blood pressure 122/70, pulse 86,  respirations 20, temp 98.4.  Oxygen saturation on room air 98%.  GENERAL:  Alert, oriented African-American male in no acute distress.  Pacer  site is without hematoma or drainage.  CARDIOVASCULAR:  Heart sounds S1, S2, regular rate and rhythm.  LUNGS:  Clear.   Pacer interrogation was a normal function.  A chest x-ray was pending.  Aspirin was restarted.   LABORATORY VALUES:  On admit, hemoglobin 14.4, hematocrit 42.3, WBC 4.5,  platelets 108.  Hemoglobin remained stable.  Platelets varied from 128 to  118.  Coags:  Pro time 14.6, INR of 1.2, PTT 31.  Chemistry on admission:  Sodium 139, potassium 3.8, chloride 106, CO2 30, BUN 12, creatinine 1.0,  glucose 105.  These have remained stable throughout his hospitalization.  Glucose was borderline throughout hospitalization.  Hepatic panel:  AST  initially was 64, ALT 85, ALP 72, total bilirubin 0.9.  On recheck on Mar 07, 2004, AST 58, ALT 77, ALP 79, total  bilirubin 0.8, direct bilirubin less  than 0.1, and magnesium was 2.0.  Statins were held secondary to abnormal LFTs secondary to hepatitis.  Plan  to evaluate as an outpatient.  Cardiac  markers:  CK-MB 496 with 7.9, 385 with 6.6, 319 with 5.8 and 345 with 7.1.  Troponin I 0.04 with a peak of 0.07.  Cholesterol 151, triglycerides 104,  HDL 28 and LDL 102.  TSH 0.485.  Dig level less than 0.2.   RADIOLOGY:  Initial chest x-ray:  Cardiomegaly.  No active disease.  A  nuclear study was done on Mar 04, 2004.  There was inferior scar primarily  involving the distal half of the inferior wall and no definite ischemia  identified.  Chest x-ray postprocedure was pending at discharge; from [facility], appeared no pneumothorax.  If abnormal, we will call patient at home.  Patient is asymptomatic.   Initial EKG on Mar 03, 2004, revealed sinus bradycardia rate of 49, right  bundle branch block, left ventricular hypertrophy, possible lateral infarct,  inferior infarction.  Followup on Mar 05, 2004, revealed sinus rhythm, right  bundle branch block, left ventricular hypertrophy and no significant change.  On Mar 10, 2004, sinus rhythm, left atrial enlargement, left axis deviation,  right bundle branch block.  Again on Mar 10, 2004, without change after his  pacer was inserted.   HOSPITAL COURSE:  Mr. Edler was admitted by Dr. Jacinto Halim, on call for Dr.  Jenne Campus, on Mar 03, 2004, secondary to chest pain, congestive heart failure,  acute onchronic with cardiomegaly.  He was admitted to telemetry unit.  Cardiac enzymes were borderline.  Peak troponin was 0.07.  By Mar 05, 2004,  he had episodes of 2:1 block.  His beta blocker and his Lanoxin were held.  He underwent Persantine/Cardiolite that showed no ischemia, positive  inferior scar.  Due to the patient's arrhythmias and continued fatigue and  shortness of breath, he underwent cardiac catheterization by Dr. Jenne Campus on  Mar 06, 2004, which revealed significant disease to the circumflex and to  the distal RCA.  EF was found to be 40-45%.  It was felt that best course of  treatment at this point  would be aggressive medical management.  His LIMA  and his saphenous vein graft were patent.  The saphenous vein graft to the  OM3 was 100% occluded, but the saphenous vein graft to the diagonal and the  LIMA to the LAD were patent.   A 2D echo was done:  EF 55-60%, mitral valvular regurg.  Left atrium was  mildly to moderately dilated.   Patient was started on Imdur.  Due to his significant coronary disease of  non-bypassed vessels and his symptoms and inability to begin beta blockers  secondary to 2:1 block on beta blocker, it was felt pacemaker would be the  patient's best plan of treatment at this point.  His Plavix was held, which  had been started post cardiac cath, and permanent pacemaker, a Medtronic En  Rhythm [id number] was placed on Mar 10, 2004, by Dr. Lenise Herald.  Patient  tolerated the procedure and by the morning of Mar 11, 2004, was stable and  ready for discharge home.  He will follow up as an outpatient.  PLEASE NOTE:  STATIN WAS NOT STARTED SECONDARY TO ABNORMAL LFTs AND WITH  HEPATITIS C.  WE WILL RECHECK LFTs AND EVALUATE AS AN OUTPATIENT.      Darcella Gasman. [place], [place].P.  Darlin Priestly, M.D.    LRI/MEDQ  D:  03/11/2004  T:  03/12/2004  Job:  [id number]

## 2013-02-02 ENCOUNTER — Encounter: Payer: Self-pay | Admitting: *Deleted

## 2013-05-13 ENCOUNTER — Encounter: Payer: Self-pay | Admitting: *Deleted

## 2013-07-12 ENCOUNTER — Encounter: Payer: Self-pay | Admitting: *Deleted

## 2013-07-16 ENCOUNTER — Encounter: Payer: Self-pay | Admitting: Cardiovascular Disease

## 2013-07-16 ENCOUNTER — Ambulatory Visit (INDEPENDENT_AMBULATORY_CARE_PROVIDER_SITE_OTHER): Payer: Medicare Other | Admitting: Cardiovascular Disease

## 2013-07-16 VITALS — BP 130/98 | HR 71 | Resp 16 | Ht 66.0 in | Wt 196.6 lb

## 2013-07-16 DIAGNOSIS — I472 Ventricular tachycardia: Secondary | ICD-10-CM

## 2013-07-16 DIAGNOSIS — I2510 Atherosclerotic heart disease of native coronary artery without angina pectoris: Secondary | ICD-10-CM

## 2013-07-16 DIAGNOSIS — I10 Essential (primary) hypertension: Secondary | ICD-10-CM

## 2013-07-16 DIAGNOSIS — R5381 Other malaise: Secondary | ICD-10-CM

## 2013-07-16 DIAGNOSIS — E669 Obesity, unspecified: Secondary | ICD-10-CM

## 2013-07-16 DIAGNOSIS — B1920 Unspecified viral hepatitis C without hepatic coma: Secondary | ICD-10-CM

## 2013-07-16 DIAGNOSIS — I471 Supraventricular tachycardia: Secondary | ICD-10-CM

## 2013-07-16 DIAGNOSIS — Z950 Presence of cardiac pacemaker: Secondary | ICD-10-CM

## 2013-07-16 DIAGNOSIS — Z79899 Other long term (current) drug therapy: Secondary | ICD-10-CM

## 2013-07-16 DIAGNOSIS — E785 Hyperlipidemia, unspecified: Secondary | ICD-10-CM

## 2013-07-16 DIAGNOSIS — I441 Atrioventricular block, second degree: Secondary | ICD-10-CM

## 2013-07-16 DIAGNOSIS — I4719 Other supraventricular tachycardia: Secondary | ICD-10-CM

## 2013-07-16 DIAGNOSIS — I4729 Other ventricular tachycardia: Secondary | ICD-10-CM

## 2013-07-16 LAB — PACEMAKER DEVICE OBSERVATION
AL AMPLITUDE: 5.6 mv
AL THRESHOLD: 0.5 V
ATRIAL PACING PM: 22.5
BAMS-0001: 170 {beats}/min
RV LEAD AMPLITUDE: 11.2 mv

## 2013-07-16 MED ORDER — AMLODIPINE BESYLATE 5 MG PO TABS: 5.0000 mg | ORAL_TABLET | Freq: Every day | ORAL | Status: DC

## 2013-07-16 NOTE — Progress Notes (Signed)
In office pacemaker interrogation. Normal device function. No changes made this session. 

## 2013-07-16 NOTE — Patient Instructions (Addendum)
Your physician recommends that you schedule a follow-up appointment in: 1 month Start amlodipine 5 mg daily. Check labs (lipid profile, CMET).

## 2013-07-17 ENCOUNTER — Encounter: Payer: Self-pay | Admitting: Cardiovascular Disease

## 2013-07-20 ENCOUNTER — Encounter: Payer: Self-pay | Admitting: Cardiovascular Disease

## 2013-07-20 DIAGNOSIS — I4729 Other ventricular tachycardia: Secondary | ICD-10-CM | POA: Insufficient documentation

## 2013-07-20 DIAGNOSIS — I2510 Atherosclerotic heart disease of native coronary artery without angina pectoris: Secondary | ICD-10-CM | POA: Insufficient documentation

## 2013-07-20 DIAGNOSIS — B1920 Unspecified viral hepatitis C without hepatic coma: Secondary | ICD-10-CM | POA: Insufficient documentation

## 2013-07-20 DIAGNOSIS — E785 Hyperlipidemia, unspecified: Secondary | ICD-10-CM | POA: Insufficient documentation

## 2013-07-20 DIAGNOSIS — E669 Obesity, unspecified: Secondary | ICD-10-CM | POA: Insufficient documentation

## 2013-07-20 DIAGNOSIS — I472 Ventricular tachycardia: Secondary | ICD-10-CM | POA: Insufficient documentation

## 2013-07-20 DIAGNOSIS — Z950 Presence of cardiac pacemaker: Secondary | ICD-10-CM | POA: Insufficient documentation

## 2013-07-20 DIAGNOSIS — I471 Supraventricular tachycardia: Secondary | ICD-10-CM | POA: Insufficient documentation

## 2013-07-20 NOTE — Assessment & Plan Note (Signed)
Rare and brief asymptomatic episodes recorded by pacemaker for several years - no apparent change in pattern.

## 2013-07-20 NOTE — Progress Notes (Signed)
Patient ID: Hunter Carter, male   DOB: [birth year], 62 y.o.   MRN: [id number]      Reason for office visit CAD p CABG, pacemaker follow up  [name] feels well. He still exercises regularly. He denies angina, but has occasional mild exertional dyspnea. No syncope or palpitations are reported. He did not have his labs drawn as recommended. He is no longer taking diltiazem and does not remember if this medication was purposefully or accidentally discontinued.   No Known Allergies  Current Outpatient Prescriptions  Medication Sig Dispense Refill  . aspirin 81 MG tablet Take 81 mg by mouth daily.      . cyanocobalamin 100 MCG tablet Take 100 mcg by mouth daily.      . Ginkgo Biloba 500 MG CAPS Take by mouth daily.      Rise Paganini Palmetto (MULTI GINSENG & SAW PALMETTO) 500 MG CAPS Take by mouth daily.      . metoprolol (LOPRESSOR) 50 MG tablet Take 50 mg by mouth 2 (two) times daily.      . potassium chloride SA (K-DUR,KLOR-CON) 20 MEQ tablet Take 20 mEq by mouth 2 (two) times daily.      . simvastatin (ZOCOR) 20 MG tablet Take 20 mg by mouth every evening.      . valsartan-hydrochlorothiazide (DIOVAN-HCT) 320-25 MG per tablet Take 1 tablet by mouth daily.      Marland Kitchen amLODipine (NORVASC) 5 MG tablet Take 1 tablet (5 mg total) by mouth daily.  180 tablet  3   No current facility-administered medications for this visit.    Past Medical History  Diagnosis Date  . CAD (coronary artery disease)   . Inferior MI   . S/P CABG x 3 02/10/2003    LIMA to LAD, SVG to first diagonal,SVG to OM1  . Heart block AV second degree     permanent pacemaker 02/2004  . Ventricular tachycardia     nonsustained  . Systemic hypertension   . Hyperlipidemia   . RBBB     Past Surgical History  Procedure Laterality Date  . Coronary artery bypass graft  02/10/03    LIMA to LAD,SVG to first diagonal, SVT to OM1  . Permanent pacemaker insertion  03/28/2010    Medtronic  . Cardiac catheterization  03/06/04   significant 3 vessel disease, totalled graft obtuse marginal  . Knee arthroscopy  [phone]  . US echocardiography  01/20/07    mild MR,mild to mod. TR,trace AI,mild PI  . Nm myocar perf wall motion  04/05/09    low risk scan-abnormal-mod. perfusion defect basal inferoseptal,basal inferior,mid inferoseptal,mid inferior and apical inferior regions    Family History  Problem Relation Age of Onset  . Heart failure Mother   . Heart failure Sister   . Hypertension Sister   . Hypertension Sister   . Liver disease Father     History   Social History  . Marital Status: Married    Spouse Name: N/A    Number of Children: N/A  . Years of Education: N/A   Occupational History  . Not on file.   Social History Main Topics  . Smoking status: Former Smoker    Quit date: 01/20/2003  . Smokeless tobacco: Not on file  . Alcohol Use: No  . Drug Use: No  . Sexual Activity: Not on file   Other Topics Concern  . Not on file   Social History Narrative  . No narrative on file    Review of systems: The  patient specifically denies any chest pain at rest or with exertion, dyspnea at rest or with exertion, orthopnea, paroxysmal nocturnal dyspnea, syncope, palpitations, focal neurological deficits, intermittent claudication, lower extremity edema, unexplained weight gain, cough, hemoptysis or wheezing.  The patient also denies abdominal pain, nausea, vomiting, dysphagia, diarrhea, constipation, polyuria, polydipsia, dysuria, hematuria, frequency, urgency, abnormal bleeding or bruising, fever, chills, unexpected weight changes, mood swings, change in skin or hair texture, change in voice quality, auditory or visual problems, allergic reactions or rashes, new musculoskeletal complaints other than usual "aches and pains".   PHYSICAL EXAM BP 130/98  Pulse 71  Resp 16  Ht 5\' 6"  (1.676 m)  Wt 196 lb 9.6 oz (89.177 kg)  BMI 31.75 kg/m2 Recheck BP 128/92 General: Alert, oriented x3, no distress,  very muscular build Head: no evidence of trauma, PERRL, EOMI, no exophtalmos or lid lag, no myxedema, no xanthelasma; normal ears, nose and oropharynx Neck: normal jugular venous pulsations and no hepatojugular reflux; brisk carotid pulses without delay and no carotid bruits Chest: clear to auscultation, no signs of consolidation by percussion or palpation, normal fremitus, symmetrical and full respiratory excursions; sternotomy scar Cardiovascular: normal position and quality of the apical impulse, regular rhythm, normal first and second heart sounds, no murmurs, rubs or gallops Abdomen: no tenderness or distention, no masses by palpation, no abnormal pulsatility or arterial bruits, normal bowel sounds, no hepatosplenomegaly Extremities: no clubbing, cyanosis or edema; 2+ radial, ulnar and brachial pulses bilaterally; 2+ right femoral, posterior tibial and dorsalis pedis pulses; 2+ left femoral, posterior tibial and dorsalis pedis pulses; no subclavian or femoral bruits Neurological: grossly nonfocal   EKG: NSR, old inferior MI, RBBB, probable LVH, anterolateral T wave inversion (unchanged)  Lipid Panel  No results found for this basename: chol, trig, hdl, cholhdl, vldl, ldlcalc    BMET    Component Value Date/Time   NA 137 03/28/2010 0637   K 3.6 03/28/2010 0637   CL 107 03/28/2010 0637   CO2 24 03/28/2010 0637   GLUCOSE 97 03/28/2010 0637   BUN 5* 03/28/2010 0637   CREATININE 0.75 03/28/2010 0637   CALCIUM 8.2* 03/28/2010 0637   GFRNONAA >60 03/28/2010 0637   GFRAA  Value: >60        The eGFR has been calculated using the MDRD equation. This calculation has not been validated in all clinical situations. eGFR's persistently <60 mL/min signify possible Chronic Kidney Disease. 03/28/2010 4696     ASSESSMENT AND PLAN HTN (hypertension) Diastolic BP is high. Not sure why diltiazem SR was stopped (increased cost?), but amlodipine is a preferable choice of Ca channel blocker since it has less  chance of simvastatin interaction.Start amlodipine 5 mg daily and reevaluate in 1 month.  Pacemaker - dual chamber Medtronic 2011 Normal comprehensive in office check. No permanent programming changes were made. Roughly 20% A pacing (mostly sensor driven), almost no V pacing.  PAT (paroxysmal atrial tachycardia) Rare device recorded episodes, longest around 5 minutes, mostly < 1 minute. One episode loks like true A Flutter (1 minute). Overall burden well under 0.1%. I f he develops more sustained arrhythmia he will need anticoagulation, as his CHADSVasc score is elevated.  NSVT (nonsustained ventricular tachycardia) Rare and brief asymptomatic episodes recorded by pacemaker for several years - no apparent change in pattern.  Obesity (BMI 30-39.9) Even when accounting for his athletic/muscular build, n\he needs to lose substantial weight.  Orders Placed This Encounter  Procedures  . Comp Met (CMET)  . CBC  . Lipid  Profile  . Pacemaker Device Observation  . EKG 12-Lead   Meds ordered this encounter  Medications  . Ginsengs-Saw Palmetto (MULTI GINSENG & SAW PALMETTO) 500 MG CAPS    Sig: Take by mouth daily.  . cyanocobalamin 100 MCG tablet    Sig: Take 100 mcg by mouth daily.  . Ginkgo Biloba 500 MG CAPS    Sig: Take by mouth daily.  Marland Kitchen amLODipine (NORVASC) 5 MG tablet    Sig: Take 1 tablet (5 mg total) by mouth daily.    Dispense:  180 tablet    Refill:  3    Rashaunda Rahl  Thurmon Fair, MD, [facility] [phone] office [phone] pager

## 2013-07-20 NOTE — Assessment & Plan Note (Signed)
Diastolic BP is high. Not sure why diltiazem SR was stopped (increased cost?), but amlodipine is a preferable choice of Ca channel blocker since it has less chance of simvastatin interaction.Start amlodipine 5 mg daily and reevaluate in 1 month.

## 2013-07-20 NOTE — Assessment & Plan Note (Signed)
Even when accounting for his athletic/muscular build, n\he needs to lose substantial weight.

## 2013-07-20 NOTE — Assessment & Plan Note (Signed)
Normal comprehensive in office check. No permanent programming changes were made. Roughly 20% A pacing (mostly sensor driven), almost no V pacing.

## 2013-07-20 NOTE — Assessment & Plan Note (Addendum)
Rare device recorded episodes, longest around 5 minutes, mostly < 1 minute. One episode loks like true A Flutter (1 minute). Overall burden well under 0.1%. I f he develops more sustained arrhythmia he will need anticoagulation, as his CHADSVasc score is elevated.

## 2013-08-17 ENCOUNTER — Ambulatory Visit: Payer: Medicare Other | Admitting: Cardiovascular Disease

## 2013-08-24 ENCOUNTER — Other Ambulatory Visit: Payer: Self-pay | Admitting: Cardiovascular Disease

## 2013-08-24 NOTE — Telephone Encounter (Signed)
Rx was sent to pharmacy electronically. 

## 2013-08-25 ENCOUNTER — Encounter: Payer: Self-pay | Admitting: Cardiovascular Disease

## 2013-08-25 ENCOUNTER — Ambulatory Visit (INDEPENDENT_AMBULATORY_CARE_PROVIDER_SITE_OTHER): Payer: Medicare Other | Admitting: Cardiovascular Disease

## 2013-08-25 VITALS — BP 112/68 | HR 80 | Resp 16 | Ht 66.0 in | Wt 200.9 lb

## 2013-08-25 DIAGNOSIS — I10 Essential (primary) hypertension: Secondary | ICD-10-CM

## 2013-08-25 DIAGNOSIS — I472 Ventricular tachycardia: Secondary | ICD-10-CM

## 2013-08-25 DIAGNOSIS — I471 Supraventricular tachycardia: Secondary | ICD-10-CM

## 2013-08-25 DIAGNOSIS — I2510 Atherosclerotic heart disease of native coronary artery without angina pectoris: Secondary | ICD-10-CM

## 2013-08-25 LAB — LIPID PANEL
HDL: 33 mg/dL — ABNORMAL LOW (ref >39)
LDL Cholesterol: 90 mg/dL (ref 0–99)
Total CHOL/HDL Ratio: 4.5 Ratio

## 2013-08-25 LAB — COMPREHENSIVE METABOLIC PANEL
AST: 135 U/L — ABNORMAL HIGH (ref 0–37)
BUN: 7 mg/dL (ref 6–23)
CO2: 27 mEq/L (ref 19–32)
Calcium: 8.2 mg/dL — ABNORMAL LOW (ref 8.4–10.5)
Sodium: 138 mEq/L (ref 135–145)
Total Protein: 7.6 g/dL (ref 6.0–8.3)

## 2013-08-25 LAB — CBC
HCT: 45.1 % (ref 39.0–52.0)
Hemoglobin: 16.1 g/dL (ref 13.0–17.0)
RDW: 14.5 % (ref 11.5–15.5)
WBC: 3.5 10*3/uL — ABNORMAL LOW (ref 4.0–10.5)

## 2013-08-25 MED ORDER — POTASSIUM CHLORIDE CRYS ER 20 MEQ PO TBCR: 20.0000 meq | EXTENDED_RELEASE_TABLET | Freq: Every day | ORAL | Status: DC

## 2013-08-25 MED ORDER — VALSARTAN-HYDROCHLOROTHIAZIDE 320-25 MG PO TABS: 1.0000 | ORAL_TABLET | Freq: Every day | ORAL | Status: DC

## 2013-08-25 MED ORDER — SIMVASTATIN 20 MG PO TABS: 20.0000 mg | ORAL_TABLET | Freq: Every day | ORAL | Status: DC

## 2013-08-25 MED ORDER — METOPROLOL TARTRATE 50 MG PO TABS: 50.0000 mg | ORAL_TABLET | Freq: Two times a day (BID) | ORAL | Status: DC

## 2013-08-25 NOTE — Patient Instructions (Signed)
Your physician recommends that you schedule a follow-up appointment in: 12 months.  

## 2013-08-25 NOTE — Progress Notes (Signed)
Patient ID: Hunter Carter, male   DOB: [birth year], 62 y.o.   MRN: [id number]      Reason for office visit HTN, CAD, PSVT  Hunter Carter has been feeling well and his blood pressure is greatly improved after the last adjustments that we made. He is preoccupied by his wife's recent diagnosis of lung cancer. She will soon begin chemotherapy at [facility] on every two-week schedule. He has no cardiovascular complaints today. His blood pressure is substantially better.  He has noted occasional nights with prominent nocturia/polyuria. It doesn't sound like he is describing a bladder problem but truly making larger on severe in. He is careful not to drink sodas or caffeine in large quantities. He had laboratory tests performed today that are not yet available for review.   No Known Allergies  Current Outpatient Prescriptions  Medication Sig Dispense Refill  . amLODipine (NORVASC) 5 MG tablet Take 1 tablet (5 mg total) by mouth daily.  180 tablet  3  . aspirin 81 MG tablet Take 81 mg by mouth daily.      . cyanocobalamin 100 MCG tablet Take 100 mcg by mouth daily.      . Ginkgo Biloba 500 MG CAPS Take by mouth daily.      Rise Paganini Palmetto (MULTI GINSENG & SAW PALMETTO) 500 MG CAPS Take by mouth daily.      . metoprolol (LOPRESSOR) 50 MG tablet Take 1 tablet (50 mg total) by mouth 2 (two) times daily.  90 tablet  3  . potassium chloride SA (K-DUR,KLOR-CON) 20 MEQ tablet Take 1 tablet (20 mEq total) by mouth daily.  90 tablet  3  . simvastatin (ZOCOR) 20 MG tablet Take 1 tablet (20 mg total) by mouth daily at 6 PM.  90 tablet  3  . valsartan-hydrochlorothiazide (DIOVAN-HCT) 320-25 MG per tablet Take 1 tablet by mouth daily.  90 tablet  3   No current facility-administered medications for this visit.    Past Medical History  Diagnosis Date  . CAD (coronary artery disease)   . Inferior MI   . S/P CABG x 3 02/10/2003    LIMA to LAD, SVG to first diagonal,SVG to OM1  . Heart block AV  second degree     permanent pacemaker 02/2004  . Ventricular tachycardia     nonsustained  . Systemic hypertension   . Hyperlipidemia   . RBBB     Past Surgical History  Procedure Laterality Date  . Coronary artery bypass graft  02/10/03    LIMA to LAD,SVG to first diagonal, SVT to OM1  . Permanent pacemaker insertion  03/28/2010    [employer]  . Cardiac catheterization  03/06/04    significant 3 vessel disease, totalled graft obtuse marginal  . Knee arthroscopy  [phone]  . US echocardiography  01/20/07    mild MR,mild to mod. TR,trace AI,mild PI  . Nm myocar perf wall motion  04/05/09    low risk scan-abnormal-mod. perfusion defect basal inferoseptal,basal inferior,mid inferoseptal,mid inferior and apical inferior regions    Family History  Problem Relation Age of Onset  . Heart failure Mother   . Heart failure Sister   . Hypertension Sister   . Hypertension Sister   . Liver disease Father     History   Social History  . Marital Status: Married    Spouse Name: N/A    Number of Children: N/A  . Years of Education: N/A   Occupational History  . Not on file.   Social  History Main Topics  . Smoking status: Former Smoker    Quit date: 01/20/2003  . Smokeless tobacco: Not on file  . Alcohol Use: No  . Drug Use: No  . Sexual Activity: Not on file   Other Topics Concern  . Not on file   Social History Narrative  . No narrative on file    Review of systems: The patient specifically denies any chest pain at rest or with exertion, dyspnea at rest or with exertion, orthopnea, paroxysmal nocturnal dyspnea, syncope, palpitations, focal neurological deficits, intermittent claudication, lower extremity edema, unexplained weight gain, cough, hemoptysis or wheezing.  The patient also denies abdominal pain, nausea, vomiting, dysphagia, diarrhea, constipation, polyuria, polydipsia, dysuria, hematuria, frequency, urgency, abnormal bleeding or bruising, fever, chills,  unexpected weight changes, mood swings, change in skin or hair texture, change in voice quality, auditory or visual problems, allergic reactions or rashes, new musculoskeletal complaints other than usual "aches and pains".   PHYSICAL EXAM BP 112/68  Pulse 80  Resp 16  Ht 5\' 6"  (1.676 m)  Wt 200 lb 14.4 oz (91.128 kg)  BMI 32.44 kg/m2  General: Alert, oriented x3, no distress, very muscular build  Head: no evidence of trauma, PERRL, EOMI, no exophtalmos or lid lag, no myxedema, no xanthelasma; normal ears, nose and oropharynx  Neck: normal jugular venous pulsations and no hepatojugular reflux; brisk carotid pulses without delay and no carotid bruits  Chest: clear to auscultation, no signs of consolidation by percussion or palpation, normal fremitus, symmetrical and full respiratory excursions; sternotomy scar  Cardiovascular: normal position and quality of the apical impulse, regular rhythm, normal first and second heart sounds, no murmurs, rubs or gallops  Abdomen: no tenderness or distention, no masses by palpation, no abnormal pulsatility or arterial bruits, normal bowel sounds, no hepatosplenomegaly  Extremities: no clubbing, cyanosis or edema; 2+ radial, ulnar and brachial pulses bilaterally; 2+ right femoral, posterior tibial and dorsalis pedis pulses; 2+ left femoral, posterior tibial and dorsalis pedis pulses; no subclavian or femoral bruits  Neurological: grossly nonfocal   EKG: NSR, old inferior MI, RBBB, probable LVH, anterolateral T wave inversion (unchanged)    Lipid Panel  No results found for this basename: chol, trig, hdl, cholhdl, vldl, ldlcalc    BMET    Component Value Date/Time   NA 137 03/28/2010 0637   K 3.6 03/28/2010 0637   CL 107 03/28/2010 0637   CO2 24 03/28/2010 0637   GLUCOSE 97 03/28/2010 0637   BUN 5* 03/28/2010 0637   CREATININE 0.75 03/28/2010 0637   CALCIUM 8.2* 03/28/2010 0637   GFRNONAA >60 03/28/2010 0637   GFRAA  Value: >60        The eGFR has been  calculated using the MDRD equation. This calculation has not been validated in all clinical situations. eGFR's persistently <60 mL/min signify possible Chronic Kidney Disease. 03/28/2010 4098     ASSESSMENT AND PLAN  Mr. Isbell blood pressure is now firmly within the desirable range. We'll review his lab tests, specifically looking for signs of diabetes mellitus as well as adequacy of lipid control.  He recently had a full pacemaker check with normal results. Tachycardia has not been a bother. He is again reminded that he is frankly obese and he needs to lose weight. 10 years following bypass surgery he has no symptoms of coronary insufficiency. His last functional study was a nuclear stress test in 2010 that showed inferior scar with a normal ejection fraction. He does not have signs or symptoms  of congestive heart failure. Followup yearly.  No orders of the defined types were placed in this encounter.   Meds ordered this encounter  Medications  . valsartan-hydrochlorothiazide (DIOVAN-HCT) 320-25 MG per tablet    Sig: Take 1 tablet by mouth daily.    Dispense:  90 tablet    Refill:  3  . potassium chloride SA (K-DUR,KLOR-CON) 20 MEQ tablet    Sig: Take 1 tablet (20 mEq total) by mouth daily.    Dispense:  90 tablet    Refill:  3  . simvastatin (ZOCOR) 20 MG tablet    Sig: Take 1 tablet (20 mg total) by mouth daily at 6 PM.    Dispense:  90 tablet    Refill:  3  . metoprolol (LOPRESSOR) 50 MG tablet    Sig: Take 1 tablet (50 mg total) by mouth 2 (two) times daily.    Dispense:  90 tablet    Refill:  3    Charisa Twitty  Thurmon Fair, MD, [facility] [phone] office [phone] pager

## 2013-10-19 ENCOUNTER — Encounter: Payer: Medicare Other | Admitting: *Deleted

## 2013-10-28 ENCOUNTER — Encounter: Payer: Self-pay | Admitting: *Deleted

## 2014-01-28 ENCOUNTER — Encounter: Payer: Self-pay | Admitting: *Deleted

## 2014-02-15 ENCOUNTER — Telehealth: Payer: Self-pay | Admitting: *Deleted

## 2014-02-15 NOTE — Telephone Encounter (Signed)
[name] called in regards to medication refills. He needs his medication filled today being he is going out of town tomorrow. He stated that [facility] has been trying to get in touch with [place]. The medication is Valsartan 320-25 mg. Pt asked for a call back regarding this.  Lamar

## 2014-02-15 NOTE — Telephone Encounter (Signed)
Returned call.  Voicemail not set up.  Phone did not ring.  Call to pharmacy and confirmed script on file.  Stated pt has not contacted them for a refill and they have not contacted [place] b/c script has not expired.  Stated will fill, but pt needs to bring in a new insurance card b/c old insurance has expired.  Call back to pt's number and straight to VM.  Unable to leave message.  Call to [facility] and number invalid.  PLEASE UPDATE PHONE NUMBER WHEN PT CALLS BACK.  PLEASE PAGE TRIAGE AS THERE IS NO WORKING NUMBER ON FILE TO CONTACT PT.  THANKS. ~ [employer] M. Nicki Reaper, BSN, RN

## 2014-03-16 ENCOUNTER — Encounter: Payer: Self-pay | Admitting: Cardiology

## 2014-05-05 ENCOUNTER — Encounter: Payer: Self-pay | Admitting: *Deleted

## 2014-05-19 ENCOUNTER — Encounter: Payer: Self-pay | Admitting: Internal Medicine

## 2014-05-25 ENCOUNTER — Encounter: Payer: Self-pay | Admitting: *Deleted

## 2014-07-13 ENCOUNTER — Ambulatory Visit (AMBULATORY_SURGERY_CENTER): Payer: Self-pay | Admitting: *Deleted

## 2014-07-13 VITALS — Ht 66.0 in | Wt 215.4 lb

## 2014-07-13 DIAGNOSIS — Z1211 Encounter for screening for malignant neoplasm of colon: Secondary | ICD-10-CM

## 2014-07-13 MED ORDER — MOVIPREP 100 G PO SOLR: ORAL | Status: DC

## 2014-07-13 NOTE — Progress Notes (Signed)
No allergies to eggs or soy. No problems with anesthesia.  Pt given Emmi instructions for colonoscopy  No oxygen use  No diet drug use  

## 2014-07-14 ENCOUNTER — Encounter: Payer: Self-pay | Admitting: *Deleted

## 2014-07-20 ENCOUNTER — Encounter: Payer: Self-pay | Admitting: Internal Medicine

## 2014-07-20 ENCOUNTER — Ambulatory Visit (AMBULATORY_SURGERY_CENTER): Payer: Medicare Other | Admitting: Internal Medicine

## 2014-07-20 VITALS — BP 120/77 | HR 67 | Temp 97.2°F | Resp 22 | Ht 66.0 in | Wt 215.0 lb

## 2014-07-20 DIAGNOSIS — D122 Benign neoplasm of ascending colon: Secondary | ICD-10-CM

## 2014-07-20 DIAGNOSIS — D124 Benign neoplasm of descending colon: Secondary | ICD-10-CM

## 2014-07-20 DIAGNOSIS — Z1211 Encounter for screening for malignant neoplasm of colon: Secondary | ICD-10-CM

## 2014-07-20 DIAGNOSIS — D125 Benign neoplasm of sigmoid colon: Secondary | ICD-10-CM

## 2014-07-20 MED ORDER — SODIUM CHLORIDE 0.9 % IV SOLN: 500.0000 mL | INTRAVENOUS | Status: DC

## 2014-07-20 NOTE — Op Note (Signed)
[facility]  [address] [place], [id number]   COLONOSCOPY PROCEDURE REPORT  PATIENT: Hunter Carter, Hunter Carter  MR#: [id number] BIRTHDATE: 08-09-1951 , 67  yrs. old GENDER: male ENDOSCOPIST: Jerene Bears, MD REFERRED MH:DQQIW Blount, M.D. PROCEDURE DATE:  07/20/2014 PROCEDURE:   Colonoscopy with snare polypectomy First Screening Colonoscopy - Avg.  risk and is 50 yrs.  old or older Yes.  Prior Negative Screening - Now for repeat screening. N/A  History of Adenoma - Now for follow-up colonoscopy & has been > or = to 3 yrs.  N/A  Polyps Removed Today? Yes. ASA CLASS:   Class III INDICATIONS:average risk for colorectal cancer and first colonoscopy. MEDICATIONS: Monitored anesthesia care and Propofol 370 mg IV  DESCRIPTION OF PROCEDURE:   After the risks benefits and alternatives of the procedure were thoroughly explained, informed consent was obtained.  The digital rectal exam revealed no rectal mass.   The LB [id number]  endoscope was introduced through the anus and advanced to the ileocecal valve. No adverse events experienced.   The quality of the prep was good, using MoviPrep The instrument was then slowly withdrawn as the colon was fully examined.      COLON FINDINGS: Tortuous colon and despite counter-pressure and position change the scope could not be advanced into the cecum. Four sessile polyps ranging from 3 to 10mm in size were found in the ascending colon.  Polypectomies were performed with a cold snare. The resection was complete, the polyp tissue was completely retrieved and sent to histology.   Two sessile polyps ranging from 5 to 33mm in size were found in the descending colon.  Polypectomies were performed with a cold snare.  The resection was complete, the polyp tissue was completely retrieved and sent to histology.   A pedunculated polyp measuring 10 mm in size was found in the descending colon.  A polypectomy was performed using snare  cautery. A pedunculated polyp measuring 3 cm in size was found in the sigmoid colon.  A polypectomy was performed using snare cautery. The resection was complete, the polyp tissue was completely retrieved and sent to histology.  The polypectomy base was closed prophylactically by placing a hemoclip.  One (1) placement was made.  There was moderate diverticulosis noted in the sigmoid colon.  Retroflexed views revealed external hemorrhoids.        The scope was withdrawn and the procedure completed. COMPLICATIONS: There were no immediate complications.  ENDOSCOPIC IMPRESSION: 1.   Four sessile polyps ranging from 3 to 43mm in size were found in the ascending colon; polypectomies were performed with a cold snare  2.   Two sessile polyps ranging from 5 to 45mm in size were found in the descending colon; polypectomies were performed with a cold snare 3.   Pedunculated polyp was found in the descending colon; polypectomy was performed using snare cautery 4.   Pedunculated polyp measuring 3 cm in size was found in the sigmoid colon; polypectomy was performed using snare cautery; hemoclip at polypectomy base 5.   Moderate diverticulosis was noted in the sigmoid colon 6.   Nonvisualization of the cecum  RECOMMENDATIONS: 1.  Await pathology results 2.  High fiber diet 3.  Office visit to discuss finding and further evaluation given nonvisualization of the cecum  eSigned:  Jerene Bears, MD 07/20/2014 9:39 AM   cc: The Patient and Lindwood Qua, MD   PATIENT NAME:  Hunter Carter, Hunter Carter MR#: [id number]

## 2014-07-20 NOTE — Patient Instructions (Addendum)
YOU HAD AN ENDOSCOPIC PROCEDURE TODAY AT [facility]: Refer to the procedure report that was given to you for any specific questions about what was found during the examination.  If the procedure report does not answer your questions, please call your gastroenterologist to clarify.  If you requested that your care partner not be given the details of your procedure findings, then the procedure report has been included in a sealed envelope for you to review at your convenience later.  YOU SHOULD EXPECT: Some feelings of bloating in the abdomen. Passage of more gas than usual.  Walking can help get rid of the air that was put into your GI tract during the procedure and reduce the bloating. If you had a lower endoscopy (such as a colonoscopy or flexible sigmoidoscopy) you may notice spotting of blood in your stool or on the toilet paper. If you underwent a bowel prep for your procedure, then you may not have a normal bowel movement for a few days.  DIET: Your first meal following the procedure should be a light meal and then it is ok to progress to your normal diet.  A half-sandwich or bowl of soup is an example of a good first meal.  Heavy or fried foods are harder to digest and may make you feel nauseous or bloated.  Likewise meals heavy in dairy and vegetables can cause extra gas to form and this can also increase the bloating.  Drink plenty of fluids but you should avoid alcoholic beverages for 24 hours. Try to eat a high fiber diet.  ACTIVITY: Your care partner should take you home directly after the procedure.  You should plan to take it easy, moving slowly for the rest of the day.  You can resume normal activity the day after the procedure however you should NOT DRIVE or use heavy machinery for 24 hours (because of the sedation medicines used during the test).    SYMPTOMS TO REPORT IMMEDIATELY: A gastroenterologist can be reached at any hour.  During normal business hours, 8:30 AM to  5:00 PM Monday through Friday, call [phone].  After hours and on weekends, please call the GI answering service at [phone] who will take a message and have the physician on call contact you.   Following lower endoscopy (colonoscopy or flexible sigmoidoscopy):  Excessive amounts of blood in the stool  Significant tenderness or worsening of abdominal pains  Swelling of the abdomen that is new, acute  Fever of 100F or higher  FOLLOW UP: If any biopsies were taken you will be contacted by phone or by letter within the next 1-3 weeks.  Call your gastroenterologist if you have not heard about the biopsies in 3 weeks.  Our staff will call the home number listed on your records the next business day following your procedure to check on you and address any questions or concerns that you may have at that time regarding the information given to you following your procedure. This is a courtesy call and so if there is no answer at the home number and we have not heard from you through the emergency physician on call, we will assume that you have returned to your regular daily activities without incident.  SIGNATURES/CONFIDENTIALITY: You and/or your care partner have signed paperwork which will be entered into your electronic medical record.  These signatures attest to the fact that that the information above on your After Visit Summary has been reviewed and is understood.  Full responsibility of the confidentiality of this discharge information lies with you and/or your care-partner.  Please, read the handouts given to you by your recovery room nurse.  HOLD all NSAIDS and ASPIRIN products, Anti-inflammatories for 2 weeks.

## 2014-07-20 NOTE — Progress Notes (Signed)
Called to room to assist during endoscopic procedure.  Patient ID and intended procedure confirmed with present staff. Received instructions for my participation in the procedure from the performing physician.  

## 2014-07-20 NOTE — Progress Notes (Signed)
Report to PACU, RN, vss, BBS= Clear.  

## 2014-07-21 ENCOUNTER — Telehealth: Payer: Self-pay | Admitting: Internal Medicine

## 2014-07-21 ENCOUNTER — Telehealth: Payer: Self-pay | Admitting: *Deleted

## 2014-07-21 NOTE — Telephone Encounter (Signed)
vm not set up, follow-up

## 2014-07-22 NOTE — Telephone Encounter (Signed)
voicemail not set up; not able to leave message

## 2014-07-22 NOTE — Telephone Encounter (Signed)
Spoke with pt; he is having no further pain or nausea.  No further needs at this time

## 2014-07-22 NOTE — Telephone Encounter (Signed)
Voice mail not set up not able to leave message.  

## 2014-07-23 ENCOUNTER — Telehealth: Payer: Self-pay | Admitting: Cardiovascular Disease

## 2014-07-23 NOTE — Telephone Encounter (Signed)
Spoke with pt and he informed me that he does not have a home phone and is unable to send transmission. He has been very busy with going to and from [place] with his wife b/c she is sick with cancer and they have been going to [place] in [place]. Pt agreed to appt on 10-19 at 4:00 PM to have device checked.

## 2014-07-23 NOTE — Telephone Encounter (Signed)
New problem:    Pt called he is back in town and has figured out why he could not make a transmission.   Pt would like a call back for help with this.

## 2014-07-26 ENCOUNTER — Encounter: Payer: Self-pay | Admitting: Internal Medicine

## 2014-08-02 ENCOUNTER — Ambulatory Visit (INDEPENDENT_AMBULATORY_CARE_PROVIDER_SITE_OTHER): Payer: Medicare Other | Admitting: *Deleted

## 2014-08-02 ENCOUNTER — Encounter: Payer: Self-pay | Admitting: Cardiovascular Disease

## 2014-08-02 DIAGNOSIS — I471 Supraventricular tachycardia: Secondary | ICD-10-CM

## 2014-08-02 LAB — MDC_IDC_ENUM_SESS_TYPE_INCLINIC
Battery Impedance: 157 Ohm
Battery Remaining Longevity: 153 mo
Battery Voltage: 2.79 V
Brady Statistic AP VP Percent: 0 %
Brady Statistic AS VP Percent: 0 %
Date Time Interrogation Session: 20151019162525
Lead Channel Impedance Value: 463 Ohm
Lead Channel Pacing Threshold Amplitude: 0.5 V
Lead Channel Pacing Threshold Pulse Width: 0.4 ms
Lead Channel Sensing Intrinsic Amplitude: 11.2 mV
Lead Channel Sensing Intrinsic Amplitude: 4 mV
Lead Channel Setting Pacing Amplitude: 2 V
Lead Channel Setting Pacing Pulse Width: 0.4 ms
MDC IDC MSMT LEADCHNL RV IMPEDANCE VALUE: 570 Ohm
MDC IDC MSMT LEADCHNL RV PACING THRESHOLD AMPLITUDE: 0.75 V
MDC IDC MSMT LEADCHNL RV PACING THRESHOLD PULSEWIDTH: 0.4 ms
MDC IDC SET LEADCHNL RA PACING AMPLITUDE: 1.75 V
MDC IDC SET LEADCHNL RV SENSING SENSITIVITY: 4 mV
MDC IDC STAT BRADY AP VS PERCENT: 23 %
MDC IDC STAT BRADY AS VS PERCENT: 77 %

## 2014-08-02 NOTE — Progress Notes (Signed)
Pacemaker check in clinic. Normal device function. Thresholds, sensing, impedances consistent with previous measurements. Device programmed to maximize longevity. 22 mode switches the longest > 10 hours, - coumadin.  Max A > 400bpm.  2 high ventricular rates noted 3-7 seconds. Device programmed at appropriate safety margins. Histogram distribution appropriate for patient activity level. Device programmed to optimize intrinsic conduction. Estimated longevity 12.5 years. Patient enrolled in remote follow-up/TTM's with [employer]. Plan to follow every 3 months remotely and see annually in office. Patient education completed.  ROV in December with Dr. Sallyanne Kuster.

## 2014-08-25 ENCOUNTER — Other Ambulatory Visit: Payer: Self-pay | Admitting: Nurse Practitioner

## 2014-08-25 DIAGNOSIS — K7460 Unspecified cirrhosis of liver: Secondary | ICD-10-CM

## 2014-09-07 ENCOUNTER — Other Ambulatory Visit: Payer: Self-pay | Admitting: Nurse Practitioner

## 2014-09-07 DIAGNOSIS — R772 Abnormality of alphafetoprotein: Secondary | ICD-10-CM

## 2014-09-16 ENCOUNTER — Encounter: Payer: Medicare Other | Admitting: Cardiovascular Disease

## 2014-09-17 ENCOUNTER — Other Ambulatory Visit: Payer: Medicare Other

## 2014-09-21 ENCOUNTER — Ambulatory Visit: Payer: Medicare Other | Admitting: Internal Medicine

## 2014-09-28 ENCOUNTER — Other Ambulatory Visit: Payer: Self-pay | Admitting: Cardiovascular Disease

## 2014-12-21 ENCOUNTER — Encounter: Payer: Self-pay | Admitting: *Deleted

## 2015-01-18 ENCOUNTER — Other Ambulatory Visit: Payer: Self-pay | Admitting: Nurse Practitioner

## 2015-01-18 DIAGNOSIS — R772 Abnormality of alphafetoprotein: Secondary | ICD-10-CM

## 2015-01-25 ENCOUNTER — Inpatient Hospital Stay: Admission: RE | Admit: 2015-01-25 | Payer: Self-pay | Source: Ambulatory Visit

## 2015-01-27 ENCOUNTER — Ambulatory Visit
Admission: RE | Admit: 2015-01-27 | Discharge: 2015-01-27 | Disposition: A | Discharge status: Home / Self Care | Payer: Medicare Other | Source: Ambulatory Visit | Attending: Nurse Practitioner | Admitting: Nurse Practitioner

## 2015-01-27 DIAGNOSIS — R772 Abnormality of alphafetoprotein: Secondary | ICD-10-CM

## 2015-01-27 MED ORDER — IOPAMIDOL (ISOVUE-300) INJECTION 61%
125.0000 mL | Freq: Once | INTRAVENOUS | Status: AC | PRN
  Administered 2015-01-27: 125 mL via INTRAVENOUS

## 2015-01-28 ENCOUNTER — Other Ambulatory Visit: Payer: Self-pay

## 2015-03-17 ENCOUNTER — Other Ambulatory Visit: Payer: Self-pay | Admitting: Cardiovascular Disease

## 2015-03-17 NOTE — Telephone Encounter (Signed)
Rx(s) sent to pharmacy electronically. Patient needs appmt - last OV 08/2013

## 2015-04-28 ENCOUNTER — Other Ambulatory Visit: Payer: Self-pay | Admitting: Cardiovascular Disease

## 2015-04-28 NOTE — Telephone Encounter (Signed)
Rx(s) sent to pharmacy electronically.  

## 2015-05-03 ENCOUNTER — Telehealth: Payer: Self-pay | Admitting: *Deleted

## 2015-05-03 NOTE — Telephone Encounter (Signed)
Attempted to contact patient at [phone] - VM box full/unable to leave message

## 2015-05-03 NOTE — Telephone Encounter (Signed)
Patient walked in and completed walk-in form - "need health report to get some ins. (insurance)"  Attempted to contact patient at [phone] (no answer and unable to leave message) Attempted to contact patient at [phone] (no answer)  Patient has not seen MD since 08/2013

## 2015-05-04 NOTE — Telephone Encounter (Signed)
Attempted to contact patient at [phone] - unable to leave VM

## 2015-05-16 ENCOUNTER — Encounter (HOSPITAL_COMMUNITY): Payer: Self-pay | Admitting: *Deleted

## 2015-05-16 ENCOUNTER — Emergency Department (HOSPITAL_COMMUNITY)
Admission: EM | Admit: 2015-05-16 | Discharge: 2015-05-16 | Disposition: A | Discharge status: Home / Self Care | Payer: Medicare Other | Attending: Emergency Medicine | Admitting: Emergency Medicine

## 2015-05-16 DIAGNOSIS — Z79891 Long term (current) use of opiate analgesic: Secondary | ICD-10-CM | POA: Diagnosis not present

## 2015-05-16 DIAGNOSIS — S0502XA Injury of conjunctiva and corneal abrasion without foreign body, left eye, initial encounter: Secondary | ICD-10-CM | POA: Diagnosis not present

## 2015-05-16 DIAGNOSIS — E785 Hyperlipidemia, unspecified: Secondary | ICD-10-CM | POA: Diagnosis not present

## 2015-05-16 DIAGNOSIS — Y9289 Other specified places as the place of occurrence of the external cause: Secondary | ICD-10-CM | POA: Diagnosis not present

## 2015-05-16 DIAGNOSIS — I2510 Atherosclerotic heart disease of native coronary artery without angina pectoris: Secondary | ICD-10-CM | POA: Diagnosis not present

## 2015-05-16 DIAGNOSIS — I10 Essential (primary) hypertension: Secondary | ICD-10-CM | POA: Insufficient documentation

## 2015-05-16 DIAGNOSIS — Z7982 Long term (current) use of aspirin: Secondary | ICD-10-CM | POA: Insufficient documentation

## 2015-05-16 DIAGNOSIS — Z950 Presence of cardiac pacemaker: Secondary | ICD-10-CM | POA: Insufficient documentation

## 2015-05-16 DIAGNOSIS — Z9889 Other specified postprocedural states: Secondary | ICD-10-CM | POA: Insufficient documentation

## 2015-05-16 DIAGNOSIS — I252 Old myocardial infarction: Secondary | ICD-10-CM | POA: Insufficient documentation

## 2015-05-16 DIAGNOSIS — Y9389 Activity, other specified: Secondary | ICD-10-CM | POA: Insufficient documentation

## 2015-05-16 DIAGNOSIS — Z951 Presence of aortocoronary bypass graft: Secondary | ICD-10-CM | POA: Insufficient documentation

## 2015-05-16 DIAGNOSIS — W228XXA Striking against or struck by other objects, initial encounter: Secondary | ICD-10-CM | POA: Insufficient documentation

## 2015-05-16 DIAGNOSIS — S0592XA Unspecified injury of left eye and orbit, initial encounter: Secondary | ICD-10-CM | POA: Diagnosis present

## 2015-05-16 DIAGNOSIS — Z79899 Other long term (current) drug therapy: Secondary | ICD-10-CM | POA: Diagnosis not present

## 2015-05-16 DIAGNOSIS — Y998 Other external cause status: Secondary | ICD-10-CM | POA: Insufficient documentation

## 2015-05-16 DIAGNOSIS — I509 Heart failure, unspecified: Secondary | ICD-10-CM | POA: Diagnosis not present

## 2015-05-16 DIAGNOSIS — M1990 Unspecified osteoarthritis, unspecified site: Secondary | ICD-10-CM | POA: Diagnosis not present

## 2015-05-16 MED ORDER — TETRACAINE HCL 0.5 % OP SOLN
2.0000 [drp] | Freq: Once | OPHTHALMIC | Status: AC
  Administered 2015-05-16: 2 [drp] via OPHTHALMIC
  Filled 2015-05-16: qty 2

## 2015-05-16 MED ORDER — FLUORESCEIN SODIUM 1 MG OP STRP
1.0000 | ORAL_STRIP | Freq: Once | OPHTHALMIC | Status: AC
  Administered 2015-05-16: 1 via OPHTHALMIC
  Filled 2015-05-16: qty 1

## 2015-05-16 MED ORDER — ERYTHROMYCIN 5 MG/GM OP OINT: 1.0000 "application " | TOPICAL_OINTMENT | Freq: Four times a day (QID) | OPHTHALMIC | Status: DC

## 2015-05-16 NOTE — ED Notes (Signed)
Declined W/C at D/C and was escorted to lobby by RN. 

## 2015-05-16 NOTE — ED Notes (Signed)
Pt reports one week ago a piece of rock and stick hit him in the LT eye. Pt has on-going LT eye pain  And drainage . Pt also reports more watery drainage to eye.

## 2015-05-16 NOTE — Discharge Instructions (Signed)
Corneal Abrasion °The cornea is the clear covering at the front and center of the eye. When looking at the colored portion of the eye (iris), you are looking through the cornea. This very thin tissue is made up of many layers. The surface layer is a single layer of cells (corneal epithelium) and is one of the most sensitive tissues in the body. If a scratch or injury causes the corneal epithelium to come off, it is called a corneal abrasion. If the injury extends to the tissues below the epithelium, the condition is called a corneal ulcer. °CAUSES  °· Scratches. °· Trauma. °· Foreign body in the eye. °Some people have recurrences of abrasions in the area of the original injury even after it has healed (recurrent erosion syndrome). Recurrent erosion syndrome generally improves and goes away with time. °SYMPTOMS  °· Eye pain. °· Difficulty or inability to keep the injured eye open. °· The eye becomes very sensitive to light. °· Recurrent erosions tend to happen suddenly, first thing in the morning, usually after waking up and opening the eye. °DIAGNOSIS  °Your health care provider can diagnose a corneal abrasion during an eye exam. Dye is usually placed in the eye using a drop or a small paper strip moistened by your tears. When the eye is examined with a special light, the abrasion shows up clearly because of the dye. °TREATMENT  °· Small abrasions may be treated with antibiotic drops or ointment alone. °· A pressure patch may be put over the eye. If this is done, follow your doctor's instructions for when to remove the patch. Do not drive or use machines while the eye patch is on. Judging distances is hard to do with a patch on. °If the abrasion becomes infected and spreads to the deeper tissues of the cornea, a corneal ulcer can result. This is serious because it can cause corneal scarring. Corneal scars interfere with light passing through the cornea and cause a loss of vision in the involved eye. °HOME CARE  INSTRUCTIONS °· Use medicine or ointment as directed. Only take over-the-counter or prescription medicines for pain, discomfort, or fever as directed by your health care provider. °· Do not drive or operate machinery if your eye is patched. Your ability to judge distances is impaired. °· If your health care provider has given you a follow-up appointment, it is very important to keep that appointment. Not keeping the appointment could result in a severe eye infection or permanent loss of vision. If there is any problem keeping the appointment, let your health care provider know. °SEEK MEDICAL CARE IF:  °· You have pain, light sensitivity, and a scratchy feeling in one eye or both eyes. °· Your pressure patch keeps loosening up, and you can blink your eye under the patch after treatment. °· Any kind of discharge develops from the eye after treatment or if the lids stick together in the morning. °· You have the same symptoms in the morning as you did with the original abrasion days, weeks, or months after the abrasion healed. °MAKE SURE YOU:  °· Understand these instructions. °· Will watch your condition. °· Will get help right away if you are not doing well or get worse. °Document Released: 09/28/2000 Document Revised: 10/06/2013 Document Reviewed: 06/08/2013 °[employer]® Patient Information ©2015 [employer]. This information is not intended to replace advice given to you by your health care provider. Make sure you discuss any questions you have with your health care provider. ° °

## 2015-05-16 NOTE — ED Provider Notes (Signed)
CSN: [id number]     Arrival date & time 05/16/15  1010 History  This chart was scribed for non-physician practitioner Margarita Mail, PA-C working with Orlie Dakin, MD by Zola Button, ED Scribe. This patient was seen in room TR04C/TR04C and the patient's care was started at 11:20 AM.       Chief Complaint  Patient presents with  . Eye Injury   The history is provided by the patient. No language interpreter was used.    HPI Comments: Hunter Carter is a 64 y.o. male who presents to the Emergency Department complaining of sudden onset left eye pain with swelling secondary to an injury 1 week ago. Patient was trimming grass when some debris hit him in the left eye. The pain worsened yesterday. He has noticed some eye drainage and crusting discharge. Patient denies photophobia. He also denies wearing glasses or contacts.    Past Medical History  Diagnosis Date  . CAD (coronary artery disease)   . Inferior MI 2005  . S/P CABG x 3 02/10/2003    LIMA to LAD, SVG to first diagonal,SVG to OM1  . Heart block AV second degree     permanent pacemaker 02/2004  . Ventricular tachycardia     nonsustained  . Systemic hypertension   . Hyperlipidemia   . RBBB   . Arthritis   . CHF (congestive heart failure)    Past Surgical History  Procedure Laterality Date  . Coronary artery bypass graft  02/10/03    LIMA to LAD,SVG to first diagonal, SVT to OM1  . Permanent pacemaker insertion  03/28/2010    [employer]  . Cardiac catheterization  03/06/04    significant 3 vessel disease, totalled graft obtuse marginal  . Knee arthroscopy Left [phone]  . US echocardiography  01/20/07    mild MR,mild to mod. TR,trace AI,mild PI  . Nm myocar perf wall motion  04/05/09    low risk scan-abnormal-mod. perfusion defect basal inferoseptal,basal inferior,mid inferoseptal,mid inferior and apical inferior regions   Family History  Problem Relation Age of Onset  . Heart failure Mother   . Heart failure Sister    . Hypertension Sister   . Hypertension Sister   . Liver disease Father   . Colon cancer Neg Hx    History  Substance Use Topics  . Smoking status: Former Smoker    Quit date: 01/20/2003  . Smokeless tobacco: Not on file  . Alcohol Use: No    Review of Systems  Constitutional: Negative for fever.  Eyes: Positive for pain and discharge. Negative for photophobia.      Allergies  Review of patient's allergies indicates no known allergies.  Home Medications   Prior to Admission medications   Medication Sig Start Date End Date Taking? Authorizing Provider  amLODipine (NORVASC) 5 MG tablet Take 1 tablet (5 mg total) by mouth daily. NEEDS APPOINTMENT FOR FUTURE REFILLS 04/28/15   Sanda Klein, MD  aspirin 81 MG tablet Take 81 mg by mouth daily.    Historical Provider, MD  cyanocobalamin 100 MCG tablet Take 100 mcg by mouth daily.    Historical Provider, MD  Ginkgo Biloba 500 MG CAPS Take by mouth daily.    Historical Provider, MD  Ginsengs-Saw Palmetto (MULTI GINSENG & SAW PALMETTO) 500 MG CAPS Take by mouth daily.    Historical Provider, MD  metoprolol (LOPRESSOR) 50 MG tablet Take 1 tablet (50 mg total) by mouth 2 (two) times daily. NEEDS APPOINTMENT FOR FUTURE REFILLS 04/28/15   [facility]  Croitoru, MD  potassium chloride SA (K-DUR,KLOR-CON) 20 MEQ tablet Take 1 tablet (20 mEq total) by mouth daily. NEEDS APPOINTMENT FOR FUTURE REFILLS 04/28/15   Sanda Klein, MD  simvastatin (ZOCOR) 20 MG tablet Take 1 tablet (20 mg total) by mouth at bedtime. NEEDS APPOINTMENT FOR FUTURE REFILLS 04/28/15   Mihai Croitoru, MD  valsartan-hydrochlorothiazide (DIOVAN-HCT) 320-25 MG per tablet Take 1 tablet by mouth daily. NEEDS APPOINTMENT FOR FUTURE REFILLS 04/28/15   Mihai Croitoru, MD   BP 163/91 mmHg  Pulse 78  Temp(Src) 98.6 F (37 C) (Oral)  Resp 18  Ht 5\' 6"  (1.676 m)  Wt 195 lb (88.451 kg)  BMI 31.49 kg/m2  SpO2 97% Physical Exam  Constitutional: He is oriented to person, place, and time.  He appears well-developed and well-nourished. No distress.  HENT:  Head: Normocephalic and atraumatic.  Mouth/Throat: Oropharynx is clear and moist. No oropharyngeal exudate.  Eyes: Pupils are equal, round, and reactive to light.  Poor vision, documented in nursing note. Significant fluorescein uptake in the inferior portion of the cornea.  Right eye pressure: 14, left eye pressure: 12.  Neck: Neck supple.  Cardiovascular: Normal rate.   Pulmonary/Chest: Effort normal.  Musculoskeletal: He exhibits no edema.  Neurological: He is alert and oriented to person, place, and time. No cranial nerve deficit.  Skin: Skin is warm and dry. No rash noted.  Psychiatric: He has a normal mood and affect. His behavior is normal.  Nursing note and vitals reviewed.   ED Course  Procedures  DIAGNOSTIC STUDIES: Oxygen Saturation is 97% on room air, normal by my interpretation.    COORDINATION OF CARE: 11:24 AM-Discussed treatment plan which includes erythromycin opthalmic ointment with patient/guardian at bedside and patient/guardian agreed to plan.    Labs Review Labs Reviewed - No data to display  Imaging Review No results found.   EKG Interpretation None      MDM   Final diagnoses:  Corneal abrasion, left, initial encounter    Corneal abrasion  Pt with corneal abrasion on PE. Tdap utd. Eye irrigated w NS, no evidence of FB.  No change in vision, acuity equal bilaterally.  Pt is not a contact lens wearer.  Exam non-concerning for orbital cellulitis, hyphema, corneal ulcers. Patient will be discharged home with erythromycin.   Patient understands to follow up with ophthalmology, & to return to ER if new symptoms develop including change in vision, purulent drainage, or entrapment.    I personally performed the services described in this documentation, which was scribed in my presence. The recorded information has been reviewed and is accurate.    [place], PA-C 05/16/15  [place], MD 05/17/15 [phone]

## 2015-05-29 ENCOUNTER — Other Ambulatory Visit: Payer: Self-pay | Admitting: Cardiovascular Disease

## 2015-05-30 NOTE — Telephone Encounter (Signed)
Rx(s) sent to pharmacy electronically.  

## 2015-05-31 ENCOUNTER — Telehealth: Payer: Self-pay | Admitting: Cardiovascular Disease

## 2015-05-31 ENCOUNTER — Other Ambulatory Visit: Payer: Self-pay | Admitting: Cardiovascular Disease

## 2015-05-31 ENCOUNTER — Other Ambulatory Visit: Payer: Self-pay

## 2015-05-31 MED ORDER — SIMVASTATIN 20 MG PO TABS: 20.0000 mg | ORAL_TABLET | Freq: Every day | ORAL | Status: DC

## 2015-05-31 NOTE — Telephone Encounter (Signed)
Rx(s) sent to pharmacy electronically. OV 08/08/15

## 2015-05-31 NOTE — Telephone Encounter (Signed)
°  1. Which medications need to be refilled? Simvastatin ,20 mg    2. Which pharmacy is medication to be sent to?[facility] on [address]   3. Do they need a 30 day or 90 day supply? 90  4. Would they like a call back once the medication has been sent to the pharmacy? Yes

## 2015-05-31 NOTE — Telephone Encounter (Signed)
Rx refill for Simvastin 20 mg ordered for #90 per patient request and sent to [facility]

## 2015-05-31 NOTE — Telephone Encounter (Signed)
Simvastatin and amlodipine refilled via request thru in-basket Patient has OV 08/08/15

## 2015-06-16 ENCOUNTER — Encounter: Payer: Self-pay | Admitting: Cardiovascular Disease

## 2015-06-22 ENCOUNTER — Other Ambulatory Visit: Payer: Self-pay | Admitting: Nurse Practitioner

## 2015-06-23 ENCOUNTER — Other Ambulatory Visit: Payer: Self-pay | Admitting: Nurse Practitioner

## 2015-06-23 DIAGNOSIS — K7460 Unspecified cirrhosis of liver: Secondary | ICD-10-CM

## 2015-06-23 DIAGNOSIS — C220 Liver cell carcinoma: Secondary | ICD-10-CM

## 2015-06-25 ENCOUNTER — Other Ambulatory Visit: Payer: Self-pay | Admitting: Cardiovascular Disease

## 2015-07-21 ENCOUNTER — Other Ambulatory Visit: Payer: Self-pay | Admitting: Cardiovascular Disease

## 2015-07-28 ENCOUNTER — Inpatient Hospital Stay (HOSPITAL_COMMUNITY)
Admission: EM | Admit: 2015-07-28 | Discharge: 2015-08-04 | DRG: 377 | Disposition: A | Discharge status: Skilled Nursing Facility | Payer: Medicare Other | Attending: Internal Medicine | Admitting: Internal Medicine

## 2015-07-28 ENCOUNTER — Encounter (HOSPITAL_COMMUNITY): Payer: Self-pay | Admitting: *Deleted

## 2015-07-28 DIAGNOSIS — K7460 Unspecified cirrhosis of liver: Secondary | ICD-10-CM | POA: Diagnosis present

## 2015-07-28 DIAGNOSIS — E785 Hyperlipidemia, unspecified: Secondary | ICD-10-CM | POA: Diagnosis present

## 2015-07-28 DIAGNOSIS — Z4659 Encounter for fitting and adjustment of other gastrointestinal appliance and device: Secondary | ICD-10-CM

## 2015-07-28 DIAGNOSIS — R578 Other shock: Secondary | ICD-10-CM | POA: Insufficient documentation

## 2015-07-28 DIAGNOSIS — D62 Acute posthemorrhagic anemia: Secondary | ICD-10-CM | POA: Diagnosis present

## 2015-07-28 DIAGNOSIS — I471 Supraventricular tachycardia: Secondary | ICD-10-CM | POA: Diagnosis not present

## 2015-07-28 DIAGNOSIS — Z8249 Family history of ischemic heart disease and other diseases of the circulatory system: Secondary | ICD-10-CM

## 2015-07-28 DIAGNOSIS — E872 Acidosis: Secondary | ICD-10-CM | POA: Diagnosis present

## 2015-07-28 DIAGNOSIS — K7290 Hepatic failure, unspecified without coma: Secondary | ICD-10-CM | POA: Diagnosis present

## 2015-07-28 DIAGNOSIS — I248 Other forms of acute ischemic heart disease: Secondary | ICD-10-CM | POA: Diagnosis present

## 2015-07-28 DIAGNOSIS — E876 Hypokalemia: Secondary | ICD-10-CM | POA: Diagnosis not present

## 2015-07-28 DIAGNOSIS — K766 Portal hypertension: Secondary | ICD-10-CM | POA: Diagnosis present

## 2015-07-28 DIAGNOSIS — K254 Chronic or unspecified gastric ulcer with hemorrhage: Principal | ICD-10-CM | POA: Diagnosis present

## 2015-07-28 DIAGNOSIS — D684 Acquired coagulation factor deficiency: Secondary | ICD-10-CM | POA: Diagnosis present

## 2015-07-28 DIAGNOSIS — Z8711 Personal history of peptic ulcer disease: Secondary | ICD-10-CM

## 2015-07-28 DIAGNOSIS — E669 Obesity, unspecified: Secondary | ICD-10-CM | POA: Diagnosis present

## 2015-07-28 DIAGNOSIS — Z7982 Long term (current) use of aspirin: Secondary | ICD-10-CM

## 2015-07-28 DIAGNOSIS — J9601 Acute respiratory failure with hypoxia: Secondary | ICD-10-CM | POA: Diagnosis present

## 2015-07-28 DIAGNOSIS — Z950 Presence of cardiac pacemaker: Secondary | ICD-10-CM

## 2015-07-28 DIAGNOSIS — I2510 Atherosclerotic heart disease of native coronary artery without angina pectoris: Secondary | ICD-10-CM | POA: Diagnosis present

## 2015-07-28 DIAGNOSIS — K920 Hematemesis: Secondary | ICD-10-CM | POA: Diagnosis not present

## 2015-07-28 DIAGNOSIS — Z951 Presence of aortocoronary bypass graft: Secondary | ICD-10-CM

## 2015-07-28 DIAGNOSIS — F10239 Alcohol dependence with withdrawal, unspecified: Secondary | ICD-10-CM | POA: Diagnosis present

## 2015-07-28 DIAGNOSIS — D6959 Other secondary thrombocytopenia: Secondary | ICD-10-CM | POA: Diagnosis present

## 2015-07-28 DIAGNOSIS — B1920 Unspecified viral hepatitis C without hepatic coma: Secondary | ICD-10-CM | POA: Diagnosis present

## 2015-07-28 DIAGNOSIS — R739 Hyperglycemia, unspecified: Secondary | ICD-10-CM | POA: Diagnosis present

## 2015-07-28 DIAGNOSIS — I5032 Chronic diastolic (congestive) heart failure: Secondary | ICD-10-CM | POA: Diagnosis present

## 2015-07-28 DIAGNOSIS — Z87891 Personal history of nicotine dependence: Secondary | ICD-10-CM

## 2015-07-28 DIAGNOSIS — I252 Old myocardial infarction: Secondary | ICD-10-CM

## 2015-07-28 DIAGNOSIS — K922 Gastrointestinal hemorrhage, unspecified: Secondary | ICD-10-CM | POA: Diagnosis present

## 2015-07-28 DIAGNOSIS — I110 Hypertensive heart disease with heart failure: Secondary | ICD-10-CM | POA: Diagnosis present

## 2015-07-28 DIAGNOSIS — K2970 Gastritis, unspecified, without bleeding: Secondary | ICD-10-CM | POA: Diagnosis present

## 2015-07-28 DIAGNOSIS — G9340 Encephalopathy, unspecified: Secondary | ICD-10-CM

## 2015-07-28 DIAGNOSIS — J9600 Acute respiratory failure, unspecified whether with hypoxia or hypercapnia: Secondary | ICD-10-CM

## 2015-07-28 DIAGNOSIS — Z6837 Body mass index (BMI) 37.0-37.9, adult: Secondary | ICD-10-CM

## 2015-07-28 HISTORY — DX: Alcohol abuse, uncomplicated: F10.10

## 2015-07-28 NOTE — ED Notes (Signed)
Pt to ED via [facility] c/o vomiting blood since 4pm and black stools x 2 days. Denies pain.

## 2015-07-28 NOTE — ED Provider Notes (Signed)
CSN: [id number]     Arrival date & time 07/28/15  2338 History   By signing my name below, I, Hunter Carter, attest that this documentation has been prepared under the direction and in the presence of Hunter Balls, MD. Electronically Signed: Forrestine Carter, ED Scribe. 07/28/2015. 11:49 PM.   Chief Complaint  Patient presents with  . Emesis   The history is provided by the patient. No language interpreter was used.    HPI Comments: Hunter Carter is a 64 y.o. male with a PMHx of CAD, hyperlipidemia, RBBB, CHF, HTN, and CABG x 3 who presents to the Emergency Department complaining of intermittent, ongoing haematemesis x 2 days. EMS reports approximately 50 CC of blood en route to department. No previous history of esophageal varices. [name] denies noting any bright red blood in stools. However, he admits to dark black stools x 1 day. No recent fever, chills, abdominal pain, chest pain, or shortness of breath. No known allergies to medications.  Past Medical History  Diagnosis Date  . CAD (coronary artery disease)   . Inferior MI 2005  . S/P CABG x 3 02/10/2003    LIMA to LAD, SVG to first diagonal,SVG to OM1  . Heart block AV second degree     permanent pacemaker 02/2004  . Ventricular tachycardia     nonsustained  . Systemic hypertension   . Hyperlipidemia   . RBBB   . Arthritis   . CHF (congestive heart failure)    Past Surgical History  Procedure Laterality Date  . Coronary artery bypass graft  02/10/03    LIMA to LAD,SVG to first diagonal, SVT to OM1  . Permanent pacemaker insertion  03/28/2010    [employer]  . Cardiac catheterization  03/06/04    significant 3 vessel disease, totalled graft obtuse marginal  . Knee arthroscopy Left [phone]  . US echocardiography  01/20/07    mild MR,mild to mod. TR,trace AI,mild PI  . Nm myocar perf wall motion  04/05/09    low risk scan-abnormal-mod. perfusion defect basal inferoseptal,basal inferior,mid inferoseptal,mid inferior and  apical inferior regions   Family History  Problem Relation Age of Onset  . Heart failure Mother   . Heart failure Sister   . Hypertension Sister   . Hypertension Sister   . Liver disease Father   . Colon cancer Neg Hx    Social History  Substance Use Topics  . Smoking status: Former Smoker    Quit date: 01/20/2003  . Smokeless tobacco: Not on file  . Alcohol Use: No    Review of Systems  Constitutional: Negative for fever and chills.  Respiratory: Negative for cough and shortness of breath.   Cardiovascular: Negative for chest pain.  Gastrointestinal: Positive for vomiting (vomiting blood). Negative for abdominal pain.  Genitourinary: Negative for dysuria.  Musculoskeletal: Negative for back pain.  Skin: Negative for rash.  Neurological: Negative for headaches.  Psychiatric/Behavioral: Negative for confusion.  All other systems reviewed and are negative.     Allergies  Review of patient's allergies indicates no known allergies.  Home Medications   Prior to Admission medications   Medication Sig Start Date End Date Taking? Authorizing Provider  amLODipine (NORVASC) 5 MG tablet Take 1 tablet (5 mg total) by mouth daily. 05/31/15   Mihai Croitoru, MD  aspirin 81 MG tablet Take 81 mg by mouth daily.    Historical Provider, MD  cyanocobalamin 100 MCG tablet Take 100 mcg by mouth daily.    Historical Provider, MD  erythromycin ophthalmic ointment Place 1 application into the left eye every 6 (six) hours. Place 1/2 inch ribbon of ointment in the affected eye 4 times a day 05/16/15   Margarita Mail, PA-C  Ginkgo Biloba 500 MG CAPS Take by mouth daily.    Historical Provider, MD  Ginsengs-Saw Palmetto (MULTI GINSENG & SAW PALMETTO) 500 MG CAPS Take by mouth daily.    Historical Provider, MD  metoprolol (LOPRESSOR) 50 MG tablet Take 1 tablet (50 mg total) by mouth 2 (two) times daily. NEEDS APPOINTMENT FOR FUTURE REFILLS 04/28/15   Sanda Klein, MD  potassium chloride SA  (K-DUR,KLOR-CON) 20 MEQ tablet Take 1 tablet (20 mEq total) by mouth daily. NEEDS APPOINTMENT FOR FUTURE REFILLS 04/28/15   Sanda Klein, MD  simvastatin (ZOCOR) 20 MG tablet Take 1 tablet (20 mg total) by mouth daily. PATIENT NEEDS TO CONTACT OFFICE FOR ADDITIONAL REFILLS 05/31/15   Sanda Klein, MD  simvastatin (ZOCOR) 20 MG tablet Take 1 tablet (20 mg total) by mouth daily. <MUST KEEP APPOINTMENT OCT. 24TH> 05/31/15   Mihai Croitoru, MD  valsartan-hydrochlorothiazide (DIOVAN-HCT) 320-25 MG per tablet Take 1 tablet by mouth daily. PATIENT NEEDS TO CONTACT OFFICE FOR ADDITIONAL REFILLS 05/30/15   Sanda Klein, MD   Triage Vitals: BP 101/61 mmHg  Pulse 61  Temp(Src) 98.1 F (36.7 C)  Resp 16  Ht 5\' 6"  (1.676 m)  Wt 205 lb (92.987 kg)  BMI 33.10 kg/m2  SpO2 97%   Physical Exam  Constitutional: He is oriented to person, place, and time. Vital signs are Hunter. He appears well-developed and well-nourished.  Non-toxic appearance. He does not appear ill. No distress.  HENT:  Head: Normocephalic and atraumatic.  Nose: Nose Hunter.  Mouth/Throat: Oropharynx is clear and moist. No oropharyngeal exudate.  Eyes: Conjunctivae and EOM are Hunter. Pupils are equal, round, and reactive to light. No scleral icterus.  Neck: Hunter range of motion. Neck supple. No tracheal deviation, no edema, no erythema and Hunter range of motion present. No thyroid mass and no thyromegaly present.  Cardiovascular: Hunter rate, regular rhythm, S1 Hunter, S2 Hunter, Hunter heart sounds, intact distal pulses and Hunter pulses.  Exam reveals no gallop and no friction rub.   No murmur heard. Pulses:      Radial pulses are 2+ on the right side, and 2+ on the left side.       Dorsalis pedis pulses are 2+ on the right side, and 2+ on the left side.  Pulmonary/Chest: Effort Hunter and breath sounds Hunter. No respiratory distress. He has no wheezes. He has no rhonchi. He has no rales.  Abdominal: Soft. Hunter appearance and  bowel sounds are Hunter. He exhibits no distension, no ascites and no mass. There is no hepatosplenomegaly. There is no tenderness. There is no rebound, no guarding and no CVA tenderness.  Musculoskeletal: Hunter range of motion. He exhibits no edema or tenderness.  Lymphadenopathy:    He has no cervical adenopathy.  Neurological: He is alert and oriented to person, place, and time. He has Hunter strength. No cranial nerve deficit or sensory deficit.  Skin: Skin is warm, dry and intact. No petechiae and no rash noted. He is not diaphoretic. No erythema. No pallor.  Psychiatric: He has a Hunter mood and affect. His behavior is Hunter. Judgment Hunter.  Nursing note and vitals reviewed.   ED Course  Procedures (including critical care time)  DIAGNOSTIC STUDIES: Oxygen Saturation is 97% on RA, adequate by my interpretation.    COORDINATION OF  CARE: 11:47 PM- Will order CBC, CMP, and Lipase. Discussed treatment plan with pt at bedside and pt agreed to plan.     Labs Review Labs Reviewed  COMPREHENSIVE METABOLIC PANEL - Abnormal; Notable for the following:    Glucose, Bld 125 (*)    Calcium 8.0 (*)    Total Protein 6.3 (*)    Albumin 2.1 (*)    AST 47 (*)    Total Bilirubin 2.5 (*)    All other components within Hunter limits  CBC WITH DIFFERENTIAL/PLATELET - Abnormal; Notable for the following:    RBC 3.77 (*)    Hemoglobin 12.8 (*)    HCT 36.4 (*)    All other components within Hunter limits  COMPREHENSIVE METABOLIC PANEL - Abnormal; Notable for the following:    Glucose, Bld 124 (*)    Calcium 8.0 (*)    Total Protein 5.9 (*)    Albumin 2.0 (*)    Total Bilirubin 2.8 (*)    All other components within Hunter limits  PROTIME-INR - Abnormal; Notable for the following:    Prothrombin Time 24.9 (*)    INR 2.28 (*)    All other components within Hunter limits  POC OCCULT BLOOD, ED - Abnormal; Notable for the following:    Fecal Occult Bld POSITIVE (*)    All other components  within Hunter limits  LIPASE, BLOOD  APTT  CBC WITH DIFFERENTIAL/PLATELET  CBC WITH DIFFERENTIAL/PLATELET  PLATELET COUNT  TYPE AND SCREEN  ABO/RH    Imaging Review No results found. I have personally reviewed and evaluated these images and lab results as part of my medical decision-making.   EKG Interpretation None      MDM   Final diagnoses:  None   Patient presents to the ED for vomiting blood.  He denies this happening in the past.  Hemoccult is positive.  No episodes of vomiting in the ED.  Plan to draw labs and likely admit for GI evaluation.  144am - I was called to the room as the patient is not feeling well and had an episode of hematemesis. He filled an entire [place] full of bloody vomitus. I spoke with gastroenterology who recommends treating this patient as if he has esophageal varices due to his apparent liver failure on laboratory studies. He was given Protonix bolus and placed on a drip, octreotide bolus and placed on a drip, as well as ceftriaxone. Platelets and INR are pending. Hemoglobin is 12, last year this was 16. Bilirubin is 2.5. I will page the Hospitalist for admission, patient continues to have bloody emesis, gastroenterology will come in tonight to scope the patient. If not patient is first on the list tomorrow morning.  CRITICAL CARE Performed by: Hunter Carter   Total critical care time: 81min - GI bleed  Critical care time was exclusive of separately billable procedures and treating other patients.  Critical care was necessary to treat or prevent imminent or life-threatening deterioration.  Critical care was time spent personally by me on the following activities: development of treatment plan with patient and/or surrogate as well as nursing, discussions with consultants, evaluation of patient's response to treatment, examination of patient, obtaining history from patient or surrogate, ordering and performing treatments and interventions, ordering  and review of laboratory studies, ordering and review of radiographic studies, pulse oximetry and re-evaluation of patient's condition.    I, Nikeia Henkes, personally performed the services described in this documentation. All medical record entries made by the scribe were at my  direction and in my presence.  I have reviewed the chart and discharge instructions and agree that the record reflects my personal performance and is accurate and complete. Dallie Patton.  07/29/2015. 12:52 AM.     Hunter Balls, MD 07/29/15 [phone]

## 2015-07-29 ENCOUNTER — Inpatient Hospital Stay (HOSPITAL_COMMUNITY): Payer: Medicare Other

## 2015-07-29 ENCOUNTER — Encounter (HOSPITAL_COMMUNITY)
Admission: EM | Disposition: A | Discharge status: Skilled Nursing Facility | Payer: Medicare Other | Source: Home / Self Care | Attending: Pulmonary Disease

## 2015-07-29 ENCOUNTER — Encounter (HOSPITAL_COMMUNITY): Payer: Self-pay | Admitting: Gastroenterology

## 2015-07-29 DIAGNOSIS — K7200 Acute and subacute hepatic failure without coma: Secondary | ICD-10-CM | POA: Diagnosis not present

## 2015-07-29 DIAGNOSIS — E669 Obesity, unspecified: Secondary | ICD-10-CM | POA: Diagnosis present

## 2015-07-29 DIAGNOSIS — Z950 Presence of cardiac pacemaker: Secondary | ICD-10-CM | POA: Diagnosis not present

## 2015-07-29 DIAGNOSIS — Z7982 Long term (current) use of aspirin: Secondary | ICD-10-CM | POA: Diagnosis not present

## 2015-07-29 DIAGNOSIS — B1920 Unspecified viral hepatitis C without hepatic coma: Secondary | ICD-10-CM | POA: Diagnosis present

## 2015-07-29 DIAGNOSIS — R578 Other shock: Secondary | ICD-10-CM | POA: Insufficient documentation

## 2015-07-29 DIAGNOSIS — J9600 Acute respiratory failure, unspecified whether with hypoxia or hypercapnia: Secondary | ICD-10-CM | POA: Insufficient documentation

## 2015-07-29 DIAGNOSIS — G9340 Encephalopathy, unspecified: Secondary | ICD-10-CM | POA: Diagnosis not present

## 2015-07-29 DIAGNOSIS — R579 Shock, unspecified: Secondary | ICD-10-CM

## 2015-07-29 DIAGNOSIS — E876 Hypokalemia: Secondary | ICD-10-CM | POA: Diagnosis not present

## 2015-07-29 DIAGNOSIS — J9601 Acute respiratory failure with hypoxia: Secondary | ICD-10-CM | POA: Diagnosis not present

## 2015-07-29 DIAGNOSIS — Z951 Presence of aortocoronary bypass graft: Secondary | ICD-10-CM | POA: Diagnosis not present

## 2015-07-29 DIAGNOSIS — I110 Hypertensive heart disease with heart failure: Secondary | ICD-10-CM | POA: Diagnosis present

## 2015-07-29 DIAGNOSIS — Z8249 Family history of ischemic heart disease and other diseases of the circulatory system: Secondary | ICD-10-CM | POA: Diagnosis not present

## 2015-07-29 DIAGNOSIS — I5032 Chronic diastolic (congestive) heart failure: Secondary | ICD-10-CM | POA: Diagnosis present

## 2015-07-29 DIAGNOSIS — K7460 Unspecified cirrhosis of liver: Secondary | ICD-10-CM | POA: Diagnosis present

## 2015-07-29 DIAGNOSIS — I248 Other forms of acute ischemic heart disease: Secondary | ICD-10-CM | POA: Diagnosis present

## 2015-07-29 DIAGNOSIS — D6959 Other secondary thrombocytopenia: Secondary | ICD-10-CM | POA: Diagnosis present

## 2015-07-29 DIAGNOSIS — R5381 Other malaise: Secondary | ICD-10-CM | POA: Diagnosis not present

## 2015-07-29 DIAGNOSIS — Z87891 Personal history of nicotine dependence: Secondary | ICD-10-CM | POA: Diagnosis not present

## 2015-07-29 DIAGNOSIS — F10231 Alcohol dependence with withdrawal delirium: Secondary | ICD-10-CM | POA: Diagnosis not present

## 2015-07-29 DIAGNOSIS — E872 Acidosis: Secondary | ICD-10-CM | POA: Diagnosis present

## 2015-07-29 DIAGNOSIS — K2970 Gastritis, unspecified, without bleeding: Secondary | ICD-10-CM | POA: Diagnosis present

## 2015-07-29 DIAGNOSIS — Z6837 Body mass index (BMI) 37.0-37.9, adult: Secondary | ICD-10-CM | POA: Diagnosis not present

## 2015-07-29 DIAGNOSIS — R739 Hyperglycemia, unspecified: Secondary | ICD-10-CM | POA: Diagnosis present

## 2015-07-29 DIAGNOSIS — E785 Hyperlipidemia, unspecified: Secondary | ICD-10-CM | POA: Diagnosis present

## 2015-07-29 DIAGNOSIS — I10 Essential (primary) hypertension: Secondary | ICD-10-CM | POA: Diagnosis not present

## 2015-07-29 DIAGNOSIS — K920 Hematemesis: Secondary | ICD-10-CM | POA: Diagnosis present

## 2015-07-29 DIAGNOSIS — K254 Chronic or unspecified gastric ulcer with hemorrhage: Secondary | ICD-10-CM | POA: Diagnosis present

## 2015-07-29 DIAGNOSIS — K922 Gastrointestinal hemorrhage, unspecified: Secondary | ICD-10-CM

## 2015-07-29 DIAGNOSIS — K7290 Hepatic failure, unspecified without coma: Secondary | ICD-10-CM | POA: Diagnosis present

## 2015-07-29 DIAGNOSIS — D684 Acquired coagulation factor deficiency: Secondary | ICD-10-CM | POA: Diagnosis present

## 2015-07-29 DIAGNOSIS — I471 Supraventricular tachycardia: Secondary | ICD-10-CM | POA: Diagnosis not present

## 2015-07-29 DIAGNOSIS — I252 Old myocardial infarction: Secondary | ICD-10-CM | POA: Diagnosis not present

## 2015-07-29 DIAGNOSIS — F10239 Alcohol dependence with withdrawal, unspecified: Secondary | ICD-10-CM | POA: Diagnosis present

## 2015-07-29 DIAGNOSIS — D62 Acute posthemorrhagic anemia: Secondary | ICD-10-CM | POA: Diagnosis present

## 2015-07-29 DIAGNOSIS — Z8711 Personal history of peptic ulcer disease: Secondary | ICD-10-CM | POA: Diagnosis not present

## 2015-07-29 DIAGNOSIS — K766 Portal hypertension: Secondary | ICD-10-CM | POA: Diagnosis present

## 2015-07-29 DIAGNOSIS — I2510 Atherosclerotic heart disease of native coronary artery without angina pectoris: Secondary | ICD-10-CM | POA: Diagnosis present

## 2015-07-29 HISTORY — PX: ESOPHAGOGASTRODUODENOSCOPY: SHX5428

## 2015-07-29 LAB — I-STAT ARTERIAL BLOOD GAS, ED
Acid-Base Excess: 2 mmol/L (ref 0.0–2.0)
Bicarbonate: 28.2 mEq/L — ABNORMAL HIGH (ref 20.0–24.0)
O2 Saturation: 100 %
PCO2 ART: 48.5 mmHg — AB (ref 35.0–45.0)
PH ART: 7.372 (ref 7.350–7.450)
TCO2: 30 mmol/L (ref 0–100)
pO2, Arterial: 479 mmHg — ABNORMAL HIGH (ref 80.0–100.0)

## 2015-07-29 LAB — COMPREHENSIVE METABOLIC PANEL
ALBUMIN: 2 g/dL — AB (ref 3.5–5.0)
ALK PHOS: 101 U/L (ref 38–126)
ALK PHOS: 96 U/L (ref 38–126)
ALT: 25 U/L (ref 17–63)
ALT: 26 U/L (ref 17–63)
ANION GAP: 7 (ref 5–15)
AST: 41 U/L (ref 15–41)
AST: 47 U/L — AB (ref 15–41)
Albumin: 2.1 g/dL — ABNORMAL LOW (ref 3.5–5.0)
Anion gap: 6 (ref 5–15)
BUN: 14 mg/dL (ref 6–20)
BUN: 14 mg/dL (ref 6–20)
CHLORIDE: 104 mmol/L (ref 101–111)
CO2: 25 mmol/L (ref 22–32)
CO2: 28 mmol/L (ref 22–32)
Calcium: 8 mg/dL — ABNORMAL LOW (ref 8.9–10.3)
Calcium: 8 mg/dL — ABNORMAL LOW (ref 8.9–10.3)
Chloride: 103 mmol/L (ref 101–111)
Creatinine, Ser: 0.85 mg/dL (ref 0.61–1.24)
Creatinine, Ser: 0.85 mg/dL (ref 0.61–1.24)
GFR calc Af Amer: >60 mL/min (ref >60)
GFR calc Af Amer: >60 mL/min (ref >60)
GFR calc non Af Amer: >60 mL/min (ref >60)
GFR calc non Af Amer: >60 mL/min (ref >60)
GLUCOSE: 124 mg/dL — AB (ref 65–99)
Glucose, Bld: 125 mg/dL — ABNORMAL HIGH (ref 65–99)
POTASSIUM: 4.1 mmol/L (ref 3.5–5.1)
Potassium: 4 mmol/L (ref 3.5–5.1)
SODIUM: 136 mmol/L (ref 135–145)
SODIUM: 137 mmol/L (ref 135–145)
Total Bilirubin: 2.5 mg/dL — ABNORMAL HIGH (ref 0.3–1.2)
Total Bilirubin: 2.8 mg/dL — ABNORMAL HIGH (ref 0.3–1.2)
Total Protein: 5.9 g/dL — ABNORMAL LOW (ref 6.5–8.1)
Total Protein: 6.3 g/dL — ABNORMAL LOW (ref 6.5–8.1)

## 2015-07-29 LAB — CBC
HEMATOCRIT: 39.5 % (ref 39.0–52.0)
HEMOGLOBIN: 13.4 g/dL (ref 13.0–17.0)
MCH: 33 pg (ref 26.0–34.0)
MCHC: 33.9 g/dL (ref 30.0–36.0)
MCV: 97.3 fL (ref 78.0–100.0)
Platelets: 40 10*3/uL — ABNORMAL LOW (ref 150–400)
RBC: 4.06 MIL/uL — ABNORMAL LOW (ref 4.22–5.81)
RDW: 15.2 % (ref 11.5–15.5)
WBC: 12.6 10*3/uL — ABNORMAL HIGH (ref 4.0–10.5)

## 2015-07-29 LAB — CBC WITH DIFFERENTIAL/PLATELET
BASOS PCT: 0 %
Basophils Absolute: 0 10*3/uL (ref 0.0–0.1)
EOS PCT: 1 %
Eosinophils Absolute: 0 10*3/uL (ref 0.0–0.7)
HCT: 36.4 % — ABNORMAL LOW (ref 39.0–52.0)
HEMOGLOBIN: 12.8 g/dL — AB (ref 13.0–17.0)
LYMPHS ABS: 1.4 10*3/uL (ref 0.7–4.0)
Lymphocytes Relative: 24 %
MCH: 34 pg (ref 26.0–34.0)
MCHC: 35.2 g/dL (ref 30.0–36.0)
MCV: 96.6 fL (ref 78.0–100.0)
MONOS PCT: 14 %
Monocytes Absolute: 0.8 10*3/uL (ref 0.1–1.0)
Neutro Abs: 3.4 10*3/uL (ref 1.7–7.7)
Neutrophils Relative %: 60 %
Platelets: UNDETERMINED 10*3/uL (ref 150–400)
RBC: 3.77 MIL/uL — AB (ref 4.22–5.81)
RDW: 13.7 % (ref 11.5–15.5)
WBC: 5.7 10*3/uL (ref 4.0–10.5)

## 2015-07-29 LAB — PROTIME-INR
INR: 2.28 — AB (ref 0.00–1.49)
INR: 1.9 — AB (ref 0.00–1.49)
PROTHROMBIN TIME: 24.9 s — AB (ref 11.6–15.2)
PROTHROMBIN TIME: 21.7 s — AB (ref 11.6–15.2)

## 2015-07-29 LAB — HEMOGLOBIN AND HEMATOCRIT, BLOOD
HCT: 36.8 % — ABNORMAL LOW (ref 39.0–52.0)
HCT: 36.1 % — ABNORMAL LOW (ref 39.0–52.0)
HEMATOCRIT: 35.9 % — AB (ref 39.0–52.0)
HEMOGLOBIN: 12.3 g/dL — AB (ref 13.0–17.0)
Hemoglobin: 12.5 g/dL — ABNORMAL LOW (ref 13.0–17.0)
Hemoglobin: 12.2 g/dL — ABNORMAL LOW (ref 13.0–17.0)

## 2015-07-29 LAB — BASIC METABOLIC PANEL
Anion gap: 11 (ref 5–15)
BUN: 19 mg/dL (ref 6–20)
CALCIUM: 7.3 mg/dL — AB (ref 8.9–10.3)
CHLORIDE: 104 mmol/L (ref 101–111)
CO2: 21 mmol/L — AB (ref 22–32)
CREATININE: 1.15 mg/dL (ref 0.61–1.24)
GFR calc non Af Amer: >60 mL/min (ref >60)
Glucose, Bld: 154 mg/dL — ABNORMAL HIGH (ref 65–99)
Potassium: 4.2 mmol/L (ref 3.5–5.1)
Sodium: 136 mmol/L (ref 135–145)

## 2015-07-29 LAB — PREPARE RBC (CROSSMATCH)

## 2015-07-29 LAB — MAGNESIUM: Magnesium: 1.3 mg/dL — ABNORMAL LOW (ref 1.7–2.4)

## 2015-07-29 LAB — TROPONIN I
Troponin I: 0.05 ng/mL — ABNORMAL HIGH (ref <0.031)
Troponin I: 0.05 ng/mL — ABNORMAL HIGH
Troponin I: 0.07 ng/mL — ABNORMAL HIGH

## 2015-07-29 LAB — MRSA PCR SCREENING: MRSA by PCR: NEGATIVE

## 2015-07-29 LAB — PHOSPHORUS: Phosphorus: 3.6 mg/dL (ref 2.5–4.6)

## 2015-07-29 LAB — LACTIC ACID, PLASMA
LACTIC ACID, VENOUS: 5.5 mmol/L — AB (ref 0.5–2.0)
LACTIC ACID, VENOUS: 4.8 mmol/L — AB (ref 0.5–2.0)
Lactic Acid, Venous: 5.1 mmol/L (ref 0.5–2.0)

## 2015-07-29 LAB — APTT: APTT: 36 s (ref 24–37)

## 2015-07-29 LAB — POC OCCULT BLOOD, ED: Fecal Occult Bld: POSITIVE — AB

## 2015-07-29 LAB — AMMONIA: Ammonia: 376 umol/L — ABNORMAL HIGH (ref 9–35)

## 2015-07-29 LAB — ABO/RH: ABO/RH(D): A POS

## 2015-07-29 LAB — LIPASE, BLOOD: Lipase: 22 U/L (ref 22–51)

## 2015-07-29 SURGERY — EGD (ESOPHAGOGASTRODUODENOSCOPY)
Anesthesia: Moderate Sedation

## 2015-07-29 MED ORDER — ANTISEPTIC ORAL RINSE SOLUTION (CORINZ)
7.0000 mL | Freq: Four times a day (QID) | OROMUCOSAL | Status: DC
  Administered 2015-07-29 – 2015-08-04 (×22): 7 mL via OROMUCOSAL

## 2015-07-29 MED ORDER — SODIUM CHLORIDE 0.9 % IV SOLN: 250.0000 mL | INTRAVENOUS | Status: DC | PRN

## 2015-07-29 MED ORDER — SODIUM CHLORIDE 0.9 % IV SOLN
50.0000 ug/h | INTRAVENOUS | Status: DC
  Administered 2015-07-29: 50 ug/h via INTRAVENOUS
  Filled 2015-07-29 (×2): qty 1

## 2015-07-29 MED ORDER — SODIUM CHLORIDE 0.9 % IV SOLN
100.0000 ug/h | INTRAVENOUS | Status: DC
  Administered 2015-07-29 (×2): 100 ug/h via INTRAVENOUS
  Filled 2015-07-29: qty 50

## 2015-07-29 MED ORDER — CHLORHEXIDINE GLUCONATE 0.12% ORAL RINSE (MEDLINE KIT)
15.0000 mL | Freq: Two times a day (BID) | OROMUCOSAL | Status: DC
  Administered 2015-07-29 – 2015-08-03 (×12): 15 mL via OROMUCOSAL

## 2015-07-29 MED ORDER — SODIUM CHLORIDE 0.9 % IV SOLN: Freq: Once | INTRAVENOUS | Status: DC

## 2015-07-29 MED ORDER — ETOMIDATE 2 MG/ML IV SOLN
INTRAVENOUS | Status: AC
  Filled 2015-07-29: qty 20

## 2015-07-29 MED ORDER — MIDAZOLAM HCL 2 MG/2ML IJ SOLN
2.0000 mg | INTRAMUSCULAR | Status: DC | PRN
Start: 2015-07-29 — End: 2015-07-30

## 2015-07-29 MED ORDER — LACTULOSE 10 GM/15ML PO SOLN
20.0000 g | Freq: Three times a day (TID) | ORAL | Status: DC
  Filled 2015-07-29 (×3): qty 30

## 2015-07-29 MED ORDER — SODIUM CHLORIDE 0.9 % IV SOLN: 80.0000 mg | Freq: Once | INTRAVENOUS | Status: DC

## 2015-07-29 MED ORDER — FENTANYL BOLUS VIA INFUSION
50.0000 ug | INTRAVENOUS | Status: DC | PRN
  Administered 2015-07-29 – 2015-07-30 (×4): 50 ug via INTRAVENOUS
  Filled 2015-07-29: qty 50

## 2015-07-29 MED ORDER — DEXTROSE 5 % IV SOLN
2.0000 g | Freq: Once | INTRAVENOUS | Status: AC
  Administered 2015-07-29: 2 g via INTRAVENOUS
  Filled 2015-07-29: qty 2

## 2015-07-29 MED ORDER — SODIUM CHLORIDE 0.9 % IV BOLUS (SEPSIS)
2000.0000 mL | Freq: Once | INTRAVENOUS | Status: AC
  Administered 2015-07-29: 2000 mL via INTRAVENOUS

## 2015-07-29 MED ORDER — SODIUM CHLORIDE 0.9 % IV SOLN
8.0000 mg/h | INTRAVENOUS | Status: DC
  Administered 2015-07-29 – 2015-07-31 (×6): 8 mg/h via INTRAVENOUS
  Filled 2015-07-29 (×12): qty 80

## 2015-07-29 MED ORDER — MIDAZOLAM HCL 2 MG/2ML IJ SOLN
2.0000 mg | INTRAMUSCULAR | Status: DC | PRN
  Administered 2015-07-29: 2 mg via INTRAVENOUS
  Filled 2015-07-29: qty 2

## 2015-07-29 MED ORDER — LACTULOSE 10 GM/15ML PO SOLN
20.0000 g | Freq: Three times a day (TID) | ORAL | Status: DC
  Administered 2015-07-29 – 2015-08-04 (×18): 20 g via ORAL
  Filled 2015-07-29 (×21): qty 30

## 2015-07-29 MED ORDER — OCTREOTIDE LOAD VIA INFUSION
50.0000 ug | Freq: Once | INTRAVENOUS | Status: AC
  Administered 2015-07-29: 50 ug via INTRAVENOUS
  Filled 2015-07-29: qty 25

## 2015-07-29 MED ORDER — LIDOCAINE HCL (CARDIAC) 20 MG/ML IV SOLN
INTRAVENOUS | Status: AC
  Filled 2015-07-29: qty 5

## 2015-07-29 MED ORDER — FENTANYL CITRATE (PF) 100 MCG/2ML IJ SOLN
INTRAMUSCULAR | Status: AC
  Filled 2015-07-29: qty 2

## 2015-07-29 MED ORDER — SODIUM CHLORIDE 0.9 % IV SOLN
25.0000 ug/h | INTRAVENOUS | Status: DC
  Administered 2015-07-29: 250 ug/h via INTRAVENOUS
  Administered 2015-07-30: 100 ug/h via INTRAVENOUS
  Administered 2015-07-30: 200 ug/h via INTRAVENOUS
  Filled 2015-07-29 (×4): qty 50

## 2015-07-29 MED ORDER — FENTANYL CITRATE (PF) 100 MCG/2ML IJ SOLN
50.0000 ug | Freq: Once | INTRAMUSCULAR | Status: AC
Start: 2015-07-29 — End: 2015-07-29
  Administered 2015-07-29: 50 ug via INTRAVENOUS

## 2015-07-29 MED ORDER — ROCURONIUM BROMIDE 50 MG/5ML IV SOLN
INTRAVENOUS | Status: AC
  Filled 2015-07-29: qty 2

## 2015-07-29 MED ORDER — MIDAZOLAM HCL 5 MG/ML IJ SOLN
INTRAMUSCULAR | Status: AC
  Filled 2015-07-29: qty 2

## 2015-07-29 MED ORDER — SODIUM CHLORIDE 0.9 % IV SOLN
10.0000 mL/h | Freq: Once | INTRAVENOUS | Status: AC
  Administered 2015-07-29: 10 mL/h via INTRAVENOUS

## 2015-07-29 MED ORDER — ONDANSETRON HCL 4 MG/2ML IJ SOLN
4.0000 mg | Freq: Once | INTRAMUSCULAR | Status: AC
  Administered 2015-07-29: 4 mg via INTRAVENOUS
  Filled 2015-07-29: qty 2

## 2015-07-29 MED ORDER — SODIUM CHLORIDE 0.9 % IV BOLUS (SEPSIS)
250.0000 mL | Freq: Once | INTRAVENOUS | Status: AC
  Administered 2015-07-29: 250 mL via INTRAVENOUS

## 2015-07-29 MED ORDER — SODIUM CHLORIDE 0.9 % IV BOLUS (SEPSIS)
1000.0000 mL | Freq: Once | INTRAVENOUS | Status: AC
Start: 2015-07-29 — End: 2015-07-29
  Administered 2015-07-29: 1000 mL via INTRAVENOUS

## 2015-07-29 MED ORDER — METOCLOPRAMIDE HCL 5 MG/ML IJ SOLN
10.0000 mg | Freq: Once | INTRAMUSCULAR | Status: AC
  Administered 2015-07-29: 10 mg via INTRAVENOUS

## 2015-07-29 MED ORDER — PANTOPRAZOLE SODIUM 40 MG IV SOLR
40.0000 mg | Freq: Two times a day (BID) | INTRAVENOUS | Status: DC
  Administered 2015-08-01 – 2015-08-03 (×5): 40 mg via INTRAVENOUS
  Filled 2015-07-29 (×6): qty 40

## 2015-07-29 MED ORDER — SUCCINYLCHOLINE CHLORIDE 20 MG/ML IJ SOLN
INTRAMUSCULAR | Status: AC
  Filled 2015-07-29: qty 1

## 2015-07-29 MED ORDER — PANTOPRAZOLE SODIUM 40 MG IV SOLR
80.0000 mg | Freq: Once | INTRAVENOUS | Status: AC
  Administered 2015-07-29: 80 mg via INTRAVENOUS
  Filled 2015-07-29: qty 80

## 2015-07-29 MED ORDER — DIPHENHYDRAMINE HCL 50 MG/ML IJ SOLN
INTRAMUSCULAR | Status: AC
  Filled 2015-07-29: qty 1

## 2015-07-29 MED ORDER — VASOPRESSIN 20 UNIT/ML IV SOLN
0.2000 [IU]/min | INTRAVENOUS | Status: DC
  Filled 2015-07-29: qty 5

## 2015-07-29 MED ORDER — SODIUM CHLORIDE 0.9 % IV SOLN
INTRAVENOUS | Status: DC
  Administered 2015-07-29 – 2015-07-31 (×5): via INTRAVENOUS

## 2015-07-29 MED ORDER — VITAMIN K1 10 MG/ML IJ SOLN
5.0000 mg | Freq: Once | INTRAVENOUS | Status: AC
  Administered 2015-07-29: 5 mg via INTRAVENOUS
  Filled 2015-07-29: qty 0.5

## 2015-07-29 MED ORDER — METOCLOPRAMIDE HCL 5 MG/ML IJ SOLN
10.0000 mg | Freq: Once | INTRAMUSCULAR | Status: AC
  Administered 2015-07-29: 10 mg via INTRAVENOUS
  Filled 2015-07-29: qty 2

## 2015-07-29 MED ORDER — MAGNESIUM SULFATE 4 GM/100ML IV SOLN
4.0000 g | Freq: Once | INTRAVENOUS | Status: AC
  Administered 2015-07-29: 4 g via INTRAVENOUS
  Filled 2015-07-29: qty 100

## 2015-07-29 NOTE — ED Notes (Signed)
Respiratory therapist present.

## 2015-07-29 NOTE — Op Note (Signed)
[facility] [place], [id number]   ENDOSCOPY PROCEDURE REPORT  PATIENT: Hunter Carter, Hunter Carter  MR#: [id number] BIRTHDATE: [birth year] , 78  yrs. old GENDER: male ENDOSCOPIST: Clarene Essex, MD REFERRED BY: PROCEDURE DATE:  08-26-2015 PROCEDURE:  EGD, diagnostic ASA CLASS:     Class III INDICATIONS:  hematemesis.  patient with cirrhosis on one aspirin a day MEDICATIONS: Versed drip per intubation routine TOPICAL ANESTHETIC: none  DESCRIPTION OF PROCEDURE: After the risks benefits and alternatives of the procedure were thoroughly explained, informed consent was obtained.  The endoscope and Pentax Gastroscope [id number] endoscope was introduced through the mouth and advanced to the second portion of the duodenum , Without limitations.  The instrument was slowly withdrawn as the mucosa was fully examined. Estimated blood loss is zero unless otherwise noted in this procedure report.    the findings are recorde       Retroflexed views revealed no abnormalities.     The scope was then withdrawn from the patient and the procedure completed.  COMPLICATIONS: There were no immediate complications.  ENDOSCOPIC IMPRESSION: 1. No esophageal varices 2. Mild gastritis  throughout 3. Multiple small antral ulcers 1 with flat clot which washed off at the end of the [email or web address] bulbitis 5. Otherwise within normal limits EGD without signs of active bleeding  RECOMMENDATIONS: care per ICU team okay to place feeding tube for meds and lactulose consider H. pylori antibodies and if positive treat at some point in the future and continue Protonix and okay to stop octreotide and will need to evaluate aspirin needs before discharge or if he needs any blood thinners at all    particularly with his low platelet count and increased INR and consider follow-up with [facility] who did his colonoscopy last year  REPEAT EXAM: as needed  eSigned:  Clarene Essex, MD  08/26/15 5:08 AM    CC:  CPT CODES: ICD CODES:  The ICD and CPT codes recommended by this software are interpretations from the data that the clinical staff has captured with the software.  The verification of the translation of this report to the ICD and CPT codes and modifiers is the sole responsibility of the health care institution and practicing physician where this report was generated.  [employer] will not be held responsible for the validity of the ICD and CPT codes included on this report.  AMA assumes no liability for data contained or not contained herein. CPT is a Designer, television/film set of the [employer].  PATIENT NAME:  Hunter Carter, Hunter Carter MR#: [id number]

## 2015-07-29 NOTE — Progress Notes (Signed)
CRITICAL VALUE ALERT  Critical value received:  Lactic Acid 5.1  Date of notification:  07/29/15  Time of notification:  7741  Critical value read back:Yes.    Nurse who received alert:  Nettie Elm, RN   MD notified (1st page):  Kara Mead, MD  Time of first page:  1020  MD notified (2nd page):  Time of second page:  Responding MD:  Kara Mead, MD  Time MD responded:  1020

## 2015-07-29 NOTE — ED Notes (Signed)
10 etomidaste given by brittany, rn.

## 2015-07-29 NOTE — Progress Notes (Signed)
CRITICAL VALUE ALERT  Critical value received:  Lactic Acid 5.5  Date of notification:  07/29/15  Time of notification:  0800  Critical value read back:Yes.    Nurse who received alert:  Nettie Elm, RN  MD notified (1st page):  Dimas Chyle, MD  Time of first page:  0800  MD notified (2nd page):  Time of second page:  Responding MD:  Dimas Chyle, MD  Time MD responded:  0800

## 2015-07-29 NOTE — ED Notes (Signed)
Pt c/o dark black stools x 2 days and blood in emesis onset today. Pt appears weak when moving from EMS stretcher to bed. Denies pain

## 2015-07-29 NOTE — ED Notes (Signed)
Verbal order for levo drip. 1st unit of blood started.

## 2015-07-29 NOTE — ED Notes (Signed)
Et tube placed, 23 at the teeth with color change present per Dr. Claudine Mouton.

## 2015-07-29 NOTE — Progress Notes (Signed)
Pt transported from ED Pod A Rm 4 to Trauma A without incidence. RT will continue to monitor.

## 2015-07-29 NOTE — Progress Notes (Signed)
Initial Nutrition Assessment  DOCUMENTATION CODES:   Obesity unspecified  INTERVENTION:   - If tube feeding is initiated, recommend Vital High Protein @ goal rate of 50 mL/hr (1200 mL/ day), with Pro-Stat 30 ml BID.  This regime provides 1400 kcal (100%), 135 gm protein (100%), and 1003.2 mL free water.  - RD team to continue to monitor for needs and diet advancement.   NUTRITION DIAGNOSIS:   Inadequate oral intake related to inability to eat as evidenced by NPO status.   GOAL:   Provide needs based on ASPEN/SCCM guidelines   MONITOR:   Diet advancement, Vent status, Labs, Weight trends, TF tolerance, Skin, I & O's  REASON FOR ASSESSMENT:   Ventilator    ASSESSMENT:   64 yr old male presented with c/o hematemesis and dark stools x 1 day. Hgb was ok initally, but patient had continued bloody emesis. He was transfused and bolused IVF. He was intubated for airway protection as he became lethargic. Prior medical history includes: CAD, inferior MI, s/p CABG, Heart Block AV, Vent. Tachyarcardia, HTN, hyperlipedemia, coronary artery bypass, CHF, arthritis, cardiac cath, Hep C  Per nurse, patient is currently intubated, but team has plans to wean from ventilator soon. Per rounds, there are no plans to feed today due to GI Bleed. Patient sedated at time of visit and no historical data was able to be obtained. Per daughter, patient lives at home with family and meals are primarily cooked by the daughter. Breakfast is usually eggs, grits, [place] bacon or sausage. Patient snacks on fruits throughout the day - from strawberries, to oranges, to bananas. Dinner is often foods like a frozen dinner, or cooked meals a few times a week such as: frozen fish, slaw and baked beans. Patient drinks beer throughout they day, and 1-2 x 40 oz bottles are normal / day or a bottle of wine. He also drinks juices or lemonades throughout the day. Patient takes several supplements daily, including: B12, and several  herbal formulations.   Patient's needs calculated using the ASPEN/SCCM guidelines for high-protein, hypocaloric feeding in critically ill obese patients.   Patient enrolled in [facility] electrolyte replacement protocol.   Labs: high lactic acid, low Mg, low CO2, high Troponion 1, high ammonia, high prothrombin, high INR  Medications reviewed. Fentanyl.   NFPE performed and patient appears to be well-nourished.   Patient is currently intubated on ventillator support.  MV: 9.8 mL Temp (24hrs), Avg:97.9 F (36.6 C), Min:97.6 F (36.4 C), Max:98.1 F (36.7 C) Propofol: none   Diet Order:  Diet NPO time specified  Skin:  Reviewed, no issues  Last BM:  07/29/2015  Height:   Ht Readings from Last 1 Encounters:  07/28/15 5\' 6"  (1.676 m)    Weight:   Wt Readings from Last 1 Encounters:  07/29/15 217 lb 6 oz (98.6 kg)    Ideal Body Weight:  64.5 kg  BMI:  Body mass index is 35.1 kg/(m^2).  Estimated Nutritional Needs:   Kcal:  6606-3016  Protein:  >/= 129 gm  Fluid:  >/= 1.5 L  EDUCATION NEEDS:   No education needs identified at this time  Kayleen Memos, Dietetic Intern 07/29/2015 12:00 PM   I agree with student dietitian note; appropriate revisions have been made.  Molli Barrows, RD, LDN, [place] Pager# [phone] After Hours Pager# [phone]

## 2015-07-29 NOTE — H&P (Signed)
PULMONARY / CRITICAL CARE MEDICINE   Name: Hunter Carter MRN: [id number] DOB: 04-Aug-1951    ADMISSION DATE:  07/28/2015 CONSULTATION DATE: 10/14   REFERRING MD :  Claudine Mouton EDP  CHIEF COMPLAINT:  Hematemesis  INITIAL PRESENTATION: 64 year old male presented to ED 10/13 with c/o hematemesis and dark stools x 1 day. Hgb was ok initially, but patient had continued bloody emesis. He was transfused and bolused IVF. He was intubated for airway protection as he became lethargic. GI to scope PCCM to see.   STUDIES:   SIGNIFICANT EVENTS: 10/14 - Admit   HISTORY OF PRESENT ILLNESS:   64 year old male with history of cirrhosis, CAD s/p CABGx3, CHF, HTN, and HLD who presented to the ED with 2 days of hematemesis. In the ED, the patient continued to have hematemesis. He gradually became more hypotensive with altered mental status while in the ED and required pressors. His mental status continued to deteriorate and he required intubation for airway protection  PAST MEDICAL HISTORY :   has a past medical history of CAD (coronary artery disease); Inferior MI ([place]) (2005); S/P CABG x 3 (02/10/2003); Heart block AV second degree; Ventricular tachycardia ([place]); Systemic hypertension; Hyperlipidemia; RBBB; Arthritis; and CHF (congestive heart failure) ([place]).  has past surgical history that includes Coronary artery bypass graft (02/10/03); Permanent pacemaker insertion (03/28/2010); Cardiac catheterization (03/06/04); Knee arthroscopy (Left, [phone]); US ECHOCARDIOGRAPHY (01/20/07); and NM MYOCAR PERF WALL MOTION (04/05/09). Prior to Admission medications   Medication Sig Start Date End Date Taking? Authorizing Provider  amLODipine (NORVASC) 5 MG tablet Take 1 tablet (5 mg total) by mouth daily. 05/31/15   Mihai Croitoru, MD  aspirin 81 MG tablet Take 81 mg by mouth daily.    Historical Provider, MD  cyanocobalamin 100 MCG tablet Take 100 mcg by mouth daily.    Historical Provider, MD  erythromycin ophthalmic  ointment Place 1 application into the left eye every 6 (six) hours. Place 1/2 inch ribbon of ointment in the affected eye 4 times a day 05/16/15   Margarita Mail, PA-C  Ginkgo Biloba 500 MG CAPS Take by mouth daily.    Historical Provider, MD  Ginsengs-Saw Palmetto (MULTI GINSENG & SAW PALMETTO) 500 MG CAPS Take by mouth daily.    Historical Provider, MD  metoprolol (LOPRESSOR) 50 MG tablet Take 1 tablet (50 mg total) by mouth 2 (two) times daily. NEEDS APPOINTMENT FOR FUTURE REFILLS 04/28/15   Sanda Klein, MD  potassium chloride SA (K-DUR,KLOR-CON) 20 MEQ tablet Take 1 tablet (20 mEq total) by mouth daily. NEEDS APPOINTMENT FOR FUTURE REFILLS 04/28/15   Sanda Klein, MD  simvastatin (ZOCOR) 20 MG tablet Take 1 tablet (20 mg total) by mouth daily. PATIENT NEEDS TO CONTACT OFFICE FOR ADDITIONAL REFILLS 05/31/15   Sanda Klein, MD  simvastatin (ZOCOR) 20 MG tablet Take 1 tablet (20 mg total) by mouth daily. <MUST KEEP APPOINTMENT OCT. 24TH> 05/31/15   Mihai Croitoru, MD  valsartan-hydrochlorothiazide (DIOVAN-HCT) 320-25 MG per tablet Take 1 tablet by mouth daily. PATIENT NEEDS TO CONTACT OFFICE FOR ADDITIONAL REFILLS 05/30/15   Sanda Klein, MD   No Known Allergies  FAMILY HISTORY:  indicated that his mother is deceased. He indicated that his father is deceased. He indicated that two of his three sisters are alive. He indicated that his brother is deceased.  SOCIAL HISTORY:  reports that he quit smoking about 12 years ago. He does not have any smokeless tobacco history on file. He reports that he does not drink alcohol  or use illicit drugs.  REVIEW OF SYSTEMS:  Not obtained due to mental status  SUBJECTIVE:   VITAL SIGNS: Temp:  [98.1 F (36.7 C)] 98.1 F (36.7 C) (10/13 2349) Pulse Rate:  [61-71] 65 (10/14 0323) Resp:  [16-29] 27 (10/14 0323) BP: (83-106)/(52-71) 106/63 mmHg (10/14 0323) SpO2:  [93 %-98 %] 96 % (10/14 0323) Weight:  [92.987 kg (205 lb)] 92.987 kg (205 lb) (10/13  2349) HEMODYNAMICS:   VENTILATOR SETTINGS:   INTAKE / OUTPUT:  Intake/Output Summary (Last 24 hours) at 07/29/15 0326 Last data filed at 07/29/15 0227  Gross per 24 hour  Intake   1100 ml  Output      0 ml  Net   1100 ml    PHYSICAL EXAMINATION: General:  64yo male, intubated Neuro:  Intubated, sedated, does not respond to verbal or physical stimuli HEENT:  NCAT, sclera anicteric Cardiovascular:  RRR, normal s1s2 Lungs:  Intubated, no wheezes or ronchi noted Abdomen:  S, ND. +BS Musculoskeletal:  No edema or clubbing noted Skin:  Dry, intact  LABS:  CBC  Recent Labs Lab 07/29/15 0051  WBC 5.7  HGB 12.8*  HCT 36.4*  PLT PLATELET CLUMPS NOTED ON SMEAR, UNABLE TO ESTIMATE   Coag's  Recent Labs Lab 07/29/15 0147  APTT 36  INR 2.28*   BMET  Recent Labs Lab 07/29/15 0021 07/29/15 0051  NA 136 137  K 4.1 4.0  CL 104 103  CO2 25 28  BUN 14 14  CREATININE 0.85 0.85  GLUCOSE 125* 124*   Electrolytes  Recent Labs Lab 07/29/15 0021 07/29/15 0051  CALCIUM 8.0* 8.0*   Sepsis Markers No results for input(s): LATICACIDVEN, PROCALCITON, O2SATVEN in the last 168 hours. ABG No results for input(s): PHART, PCO2ART, PO2ART in the last 168 hours. Liver Enzymes  Recent Labs Lab 07/29/15 0021 07/29/15 0051  AST 47* 41  ALT 26 25  ALKPHOS 101 96  BILITOT 2.5* 2.8*  ALBUMIN 2.1* 2.0*   Cardiac Enzymes No results for input(s): TROPONINI, PROBNP in the last 168 hours. Glucose No results for input(s): GLUCAP in the last 168 hours.  Imaging No results found.   ASSESSMENT / PLAN:  PULMONARY OETT 10/14 >>> A: Acute Respiratory Failure P:   Intubated on vent Check ABG Check CXR and ABG in AM  CARDIOVASCULAR A: Hypotension secondary to acute blood loss H/o HTN, CAD s/p CABGx3, HLD CHF P:  IVF resuscitation Pressors as needed to keep MAP >65 pRBC transfusion started in ED  RENAL A:  No Acute Issues P:   Follow  BMP  GASTROINTESTINAL A:   Brisk Upper GI bleed, r/o esophageal varices, r/o PUD Cirrhosis P:   GI consulted - will have urgent scope Protonix PID NPO Octreotide  HEMATOLOGIC A: Acute Blood Loss Anemia Coagulopathy - likely secondary to liver disease P:  H/H q6hrs Trend CBC, INR Will give vitamin K and 1U FFP  INFECTIOUS A:  No active Issues P:   Monitor  ENDOCRINE A:  Hyperglycemia P:   Monitor on BMP  NEUROLOGIC A:   Acute encephalopathy - likely secondary to acute blood loss, ?hepatic encephalopathy History of alcohol abuse P:   Fentanyl infusion RASS goal: -1 WUA Monitor for signs of withdrawal Consider lactulose if mental status not improving.   FAMILY  - Updates: No family available  - Inter-disciplinary family meet or Palliative Care meeting due by: 07/29/2015   SUMMARY: 64yo male with history of EtOH abuse and cirrhosis presenting with 2 days of  hematemesis. Required intubation and pressor support in the ED due to worsening mental status and hypotension. GI will scope this morning. Will reverse coagulopathy Vitamin K and 1U FFP.   Algis Greenhouse. Jerline Pain, [place] Resident PGY-2 07/29/2015 4:05 AM

## 2015-07-29 NOTE — ED Notes (Signed)
Endoscopy at bedside. 

## 2015-07-29 NOTE — ED Notes (Signed)
Levophed drip stopped. Blood infusion slowed to 290mL/hr.

## 2015-07-29 NOTE — Consult Note (Signed)
Reason for Consult: Upper GI bleeding Referring Physician: ER physician  Hunter Carter is an 64 y.o. male.  HPI: Patient seen and examined and hospital computer reviewed and case discussed with ER physician twice on the phone with history of cirrhosis and continual upper GI bleeding with a history of a colonoscopy last year by a different gastroenterology group but no previous bouts of bleeding and history of hepatitis C difficult to tell on computer review whether that's ever been treated or not and patient has become encephalopathic and was going to be intubated for his urgent endoscopy and his previous CT and labs were reviewed as well  Past Medical History  Diagnosis Date  . CAD (coronary artery disease)   . Inferior MI ([place]) 2005  . S/P CABG x 3 02/10/2003    LIMA to LAD, SVG to first diagonal,SVG to OM1  . Heart block AV second degree     permanent pacemaker 02/2004  . Ventricular tachycardia ([facility])     nonsustained  . Systemic hypertension   . Hyperlipidemia   . RBBB   . Arthritis   . CHF (congestive heart failure) [facility])     Past Surgical History  Procedure Laterality Date  . Coronary artery bypass graft  02/10/03    LIMA to LAD,SVG to first diagonal, SVT to OM1  . Permanent pacemaker insertion  03/28/2010    [employer]  . Cardiac catheterization  03/06/04    significant 3 vessel disease, totalled graft obtuse marginal  . Knee arthroscopy Left [phone]  . US echocardiography  01/20/07    mild MR,mild to mod. TR,trace AI,mild PI  . Nm myocar perf wall motion  04/05/09    low risk scan-abnormal-mod. perfusion defect basal inferoseptal,basal inferior,mid inferoseptal,mid inferior and apical inferior regions    Family History  Problem Relation Age of Onset  . Heart failure Mother   . Heart failure Sister   . Hypertension Sister   . Hypertension Sister   . Liver disease Father   . Colon cancer Neg Hx     Social History:  reports that he quit smoking about 12 years  ago. He does not have any smokeless tobacco history on file. He reports that he does not drink alcohol or use illicit drugs.  Allergies: No Known Allergies  Medications: I have reviewed the patient's current medications.  Results for orders placed or performed during the hospital encounter of 07/28/15 (from the past 48 hour(s))  POC occult blood, ED Provider will collect     Status: Abnormal   Collection Time: 07/28/15 11:57 PM  Result Value Ref Range   Fecal Occult Bld POSITIVE (A) NEGATIVE  Type and screen     Status: None (Preliminary result)   Collection Time: 07/29/15 12:04 AM  Result Value Ref Range   ABO/RH(D) A POS    Antibody Screen NEG    Sample Expiration 08/01/2015    Unit Number [id number]    Blood Component Type RED CELLS,LR    Unit division 00    Status of Unit ISSUED    Transfusion Status OK TO TRANSFUSE    Crossmatch Result Compatible    Unit Number [id number]    Blood Component Type RED CELLS,LR    Unit division 00    Status of Unit ALLOCATED    Transfusion Status OK TO TRANSFUSE    Crossmatch Result Compatible   ABO/Rh     Status: None (Preliminary result)   Collection Time: 07/29/15 12:04 AM  Result Value Ref Range  ABO/RH(D) A POS   Comprehensive metabolic panel     Status: Abnormal   Collection Time: 07/29/15 12:21 AM  Result Value Ref Range   Sodium 136 135 - 145 mmol/L   Potassium 4.1 3.5 - 5.1 mmol/L   Chloride 104 101 - 111 mmol/L   CO2 25 22 - 32 mmol/L   Glucose, Bld 125 (H) 65 - 99 mg/dL   BUN 14 6 - 20 mg/dL   Creatinine, Ser 0.85 0.61 - 1.24 mg/dL   Calcium 8.0 (L) 8.9 - 10.3 mg/dL   Total Protein 6.3 (L) 6.5 - 8.1 g/dL   Albumin 2.1 (L) 3.5 - 5.0 g/dL   AST 47 (H) 15 - 41 U/L   ALT 26 17 - 63 U/L   Alkaline Phosphatase 101 38 - 126 U/L   Total Bilirubin 2.5 (H) 0.3 - 1.2 mg/dL   GFR calc non Af Amer >60 >60 mL/min   GFR calc Af Amer >60 >60 mL/min    Comment: (NOTE) The eGFR has been calculated using the CKD EPI  equation. This calculation has not been validated in all clinical situations. eGFR's persistently <60 mL/min signify possible Chronic Kidney Disease.    Anion gap 7 5 - 15  Lipase, blood     Status: None   Collection Time: 07/29/15 12:21 AM  Result Value Ref Range   Lipase 22 22 - 51 U/L  CBC with Differential/Platelet     Status: Abnormal   Collection Time: 07/29/15 12:51 AM  Result Value Ref Range   WBC 5.7 4.0 - 10.5 K/uL   RBC 3.77 (L) 4.22 - 5.81 MIL/uL   Hemoglobin 12.8 (L) 13.0 - 17.0 g/dL   HCT 36.4 (L) 39.0 - 52.0 %   MCV 96.6 78.0 - 100.0 fL   MCH 34.0 26.0 - 34.0 pg   MCHC 35.2 30.0 - 36.0 g/dL   RDW 13.7 11.5 - 15.5 %   Platelets PLATELET CLUMPS NOTED ON SMEAR, UNABLE TO ESTIMATE 150 - 400 K/uL    Comment: REPEATED TO VERIFY SPECIMEN CHECKED FOR CLOTS    Neutrophils Relative % 60 %   Neutro Abs 3.4 1.7 - 7.7 K/uL   Lymphocytes Relative 24 %   Lymphs Abs 1.4 0.7 - 4.0 K/uL   Monocytes Relative 14 %   Monocytes Absolute 0.8 0.1 - 1.0 K/uL   Eosinophils Relative 1 %   Eosinophils Absolute 0.0 0.0 - 0.7 K/uL   Basophils Relative 0 %   Basophils Absolute 0.0 0.0 - 0.1 K/uL  Comprehensive metabolic panel     Status: Abnormal   Collection Time: 07/29/15 12:51 AM  Result Value Ref Range   Sodium 137 135 - 145 mmol/L   Potassium 4.0 3.5 - 5.1 mmol/L   Chloride 103 101 - 111 mmol/L   CO2 28 22 - 32 mmol/L   Glucose, Bld 124 (H) 65 - 99 mg/dL   BUN 14 6 - 20 mg/dL   Creatinine, Ser 0.85 0.61 - 1.24 mg/dL   Calcium 8.0 (L) 8.9 - 10.3 mg/dL   Total Protein 5.9 (L) 6.5 - 8.1 g/dL   Albumin 2.0 (L) 3.5 - 5.0 g/dL   AST 41 15 - 41 U/L   ALT 25 17 - 63 U/L   Alkaline Phosphatase 96 38 - 126 U/L   Total Bilirubin 2.8 (H) 0.3 - 1.2 mg/dL   GFR calc non Af Amer >60 >60 mL/min   GFR calc Af Amer >60 >60 mL/min    Comment: (  NOTE) The eGFR has been calculated using the CKD EPI equation. This calculation has not been validated in all clinical situations. eGFR's  persistently <60 mL/min signify possible Chronic Kidney Disease.    Anion gap 6 5 - 15  Protime-INR     Status: Abnormal   Collection Time: 07/29/15  1:47 AM  Result Value Ref Range   Prothrombin Time 24.9 (H) 11.6 - 15.2 seconds   INR 2.28 (H) 0.00 - 1.49  APTT     Status: None   Collection Time: 07/29/15  1:47 AM  Result Value Ref Range   aPTT 36 24 - 37 seconds  Prepare RBC     Status: None   Collection Time: 07/29/15  2:55 AM  Result Value Ref Range   Order Confirmation ORDER PROCESSED BY BLOOD BANK   Ammonia     Status: Abnormal   Collection Time: 07/29/15  3:00 AM  Result Value Ref Range   Ammonia 376 (H) 9 - 35 umol/L  Prepare fresh frozen plasma     Status: None (Preliminary result)   Collection Time: 07/29/15  3:01 AM  Result Value Ref Range   Unit Number [id number]    Blood Component Type THWPLS APHR1    Unit division 00    Status of Unit ALLOCATED    Transfusion Status OK TO TRANSFUSE    Unit Number [id number]    Blood Component Type THW PLS APHR    Unit division 00    Status of Unit ALLOCATED    Transfusion Status OK TO TRANSFUSE   I-Stat arterial blood gas, ED     Status: Abnormal   Collection Time: 07/29/15  4:00 AM  Result Value Ref Range   pH, Arterial 7.372 7.350 - 7.450   pCO2 arterial 48.5 (H) 35.0 - 45.0 mmHg   pO2, Arterial 479.0 (H) 80.0 - 100.0 mmHg   Bicarbonate 28.2 (H) 20.0 - 24.0 mEq/L   TCO2 30 0 - 100 mmol/L   O2 Saturation 100.0 %   Acid-Base Excess 2.0 0.0 - 2.0 mmol/L   Patient temperature 98.6 F    Collection site RADIAL, ALLEN'S TEST ACCEPTABLE    Drawn by RT    Sample type ARTERIAL     Dg Chest Portable 1 View  07/29/2015  CLINICAL DATA:  Post intubation EXAM: PORTABLE CHEST 1 VIEW COMPARISON:  03/29/2010 FINDINGS: Postoperative changes in the mediastinum. Cardiac pacemaker. New placement of endotracheal tube with tip measuring 2.1 cm above the carina. Shallow inspiration. Suggestion of mild blunting of the left costophrenic  angle possibly indicating a small effusion. No focal airspace disease or consolidation in the lungs. No pneumothorax. Normal heart size and pulmonary vascularity. IMPRESSION: Endotracheal tube placed with tip measuring 2.1 cm above the carina. Shallow inspiration with possible small left pleural effusion. Electronically Signed   By: Lucienne Capers M.D.   On: 07/29/2015 04:02    ROS negative except above he does have a history of coronary artery disease and is on an aspirin a day Blood pressure 145/86, pulse 73, temperature 98.1 F (36.7 C), resp. rate 18, height $RemoveBe'5\' 6"'boHJAYsDg$  (1.676 m), weight 92.987 kg (205 lb), SpO2 96 %. Physical Exam please see preassessment evaluation labs and previous CT report reviewed  Assessment/Plan: Patient with cirrhosis secondary to hepatitis C with upper GI bleeding and also on an aspirin a day Plan: We'll proceed with urgent endoscopy as per ER physician and agree with IV Protonix and octreotide and FFP and transfusion when necessary and critical care  consult and possibly platelet transfusions as well pending endoscopic findings and final lab value  [place] E 07/29/2015, 4:06 AM

## 2015-07-29 NOTE — ED Notes (Signed)
Preparing for intubation. Dr. Claudine Mouton at the bedside.

## 2015-07-29 NOTE — ED Notes (Signed)
Updated paul, np. Fentanyl drip started at 26mcg/hr, he acknowleges, orders to hold 2nd unit of blood for now.

## 2015-07-29 NOTE — ED Notes (Signed)
100mg  roc given by brittany, rn for assist with intubation

## 2015-07-29 NOTE — ED Notes (Signed)
Levophed drip started at 47mcg/min, current bp 83/55, p 68

## 2015-07-29 NOTE — ED Notes (Signed)
Phlebotomy contacted to re-draw cbc and cmp; first specimen hemolyzed

## 2015-07-29 NOTE — ED Notes (Signed)
MD at bedside. 

## 2015-07-29 NOTE — ED Notes (Signed)
Second unit of RBC held per [facility] NP

## 2015-07-29 NOTE — ED Notes (Signed)
Endoscopy team present, transferring to larger room, trama A.

## 2015-07-29 NOTE — Progress Notes (Addendum)
Patient had run of SVT shortly after being suctioned.  Rhythm converted back to NSR after approx. 30 beats.  Patient asymptomatic.  Adam Phenix, MD made aware.  Will continue to monitor.

## 2015-07-30 ENCOUNTER — Encounter (HOSPITAL_COMMUNITY): Payer: Self-pay

## 2015-07-30 ENCOUNTER — Inpatient Hospital Stay (HOSPITAL_COMMUNITY): Payer: Medicare Other

## 2015-07-30 DIAGNOSIS — J9601 Acute respiratory failure with hypoxia: Secondary | ICD-10-CM

## 2015-07-30 DIAGNOSIS — J9602 Acute respiratory failure with hypercapnia: Secondary | ICD-10-CM

## 2015-07-30 DIAGNOSIS — K7200 Acute and subacute hepatic failure without coma: Secondary | ICD-10-CM

## 2015-07-30 LAB — PROTIME-INR
INR: 1.8 — ABNORMAL HIGH (ref 0.00–1.49)
Prothrombin Time: 20.8 seconds — ABNORMAL HIGH (ref 11.6–15.2)

## 2015-07-30 LAB — CBC
HEMATOCRIT: 32.8 % — AB (ref 39.0–52.0)
HEMOGLOBIN: 11.4 g/dL — AB (ref 13.0–17.0)
MCH: 33.4 pg (ref 26.0–34.0)
MCHC: 34.8 g/dL (ref 30.0–36.0)
MCV: 96.2 fL (ref 78.0–100.0)
Platelets: 50 10*3/uL — ABNORMAL LOW (ref 150–400)
RBC: 3.41 MIL/uL — AB (ref 4.22–5.81)
RDW: 15.6 % — ABNORMAL HIGH (ref 11.5–15.5)
WBC: 8.7 10*3/uL (ref 4.0–10.5)

## 2015-07-30 LAB — PREPARE FRESH FROZEN PLASMA
UNIT DIVISION: 0
Unit division: 0

## 2015-07-30 LAB — BASIC METABOLIC PANEL
ANION GAP: 7 (ref 5–15)
BUN: 22 mg/dL — ABNORMAL HIGH (ref 6–20)
CHLORIDE: 106 mmol/L (ref 101–111)
CO2: 23 mmol/L (ref 22–32)
Calcium: 7.5 mg/dL — ABNORMAL LOW (ref 8.9–10.3)
Creatinine, Ser: 1.01 mg/dL (ref 0.61–1.24)
GFR calc non Af Amer: >60 mL/min (ref >60)
Glucose, Bld: 106 mg/dL — ABNORMAL HIGH (ref 65–99)
POTASSIUM: 3.8 mmol/L (ref 3.5–5.1)
Sodium: 136 mmol/L (ref 135–145)

## 2015-07-30 LAB — GLUCOSE, CAPILLARY
Glucose-Capillary: 103 mg/dL — ABNORMAL HIGH (ref 65–99)
Glucose-Capillary: 89 mg/dL (ref 65–99)

## 2015-07-30 LAB — AMMONIA: Ammonia: 127 umol/L — ABNORMAL HIGH (ref 9–35)

## 2015-07-30 MED ORDER — VITAL HIGH PROTEIN PO LIQD
1000.0000 mL | ORAL | Status: DC
  Administered 2015-07-30 – 2015-07-31 (×2): 1000 mL
  Filled 2015-07-30 (×4): qty 1000

## 2015-07-30 MED ORDER — MIDAZOLAM HCL 2 MG/2ML IJ SOLN: 1.0000 mg | INTRAMUSCULAR | Status: DC | PRN

## 2015-07-30 MED ORDER — DEXMEDETOMIDINE HCL IN NACL 200 MCG/50ML IV SOLN
0.4000 ug/kg/h | INTRAVENOUS | Status: DC
  Administered 2015-07-30: 0.4 ug/kg/h via INTRAVENOUS
  Administered 2015-07-30: 0.2 ug/kg/h via INTRAVENOUS
  Administered 2015-07-31 – 2015-08-01 (×4): 0.16 ug/kg/h via INTRAVENOUS
  Filled 2015-07-30 (×6): qty 50

## 2015-07-30 MED ORDER — PRO-STAT SUGAR FREE PO LIQD
30.0000 mL | Freq: Three times a day (TID) | ORAL | Status: DC
  Administered 2015-07-30 – 2015-08-03 (×10): 30 mL
  Filled 2015-07-30 (×12): qty 30

## 2015-07-30 MED ORDER — ERYTHROMYCIN 5 MG/GM OP OINT
TOPICAL_OINTMENT | Freq: Four times a day (QID) | OPHTHALMIC | Status: DC
  Administered 2015-07-30 (×2): 1 via OPHTHALMIC
  Administered 2015-07-31 (×2): via OPHTHALMIC
  Administered 2015-07-31 – 2015-08-01 (×3): 1 via OPHTHALMIC
  Administered 2015-08-01: 18:00:00 via OPHTHALMIC
  Administered 2015-08-01: 1 via OPHTHALMIC
  Administered 2015-08-01 – 2015-08-04 (×11): via OPHTHALMIC
  Filled 2015-07-30: qty 3.5

## 2015-07-30 NOTE — Progress Notes (Signed)
Nutrition Follow-up  DOCUMENTATION CODES:   Obesity unspecified  INTERVENTION:   Recommend Increasing Vital High Protein to goal rate of 40 mL/hr (960 mL/ day)  Will order Pro-Stat 30 ml TID.   This regimen provides 1260 kcal, 129 gm protein, and 802 mL free water.   NUTRITION DIAGNOSIS:   Inadequate oral intake related to inability to eat as evidenced by NPO status. Ongoing.   GOAL:   Provide needs based on ASPEN/SCCM guidelines Not yet met.   MONITOR:   Diet advancement, Vent status, Labs, Weight trends, TF tolerance, Skin, I & O's  REASON FOR ASSESSMENT:   Consult Enteral/tube feeding initiation and management  ASSESSMENT:   64 yr old male presented with c/o hematemesis and dark stools x 1 day. Hgb was ok initally, but patient had continued bloody emesis. He was transfused and bolused IVF. He was intubated for airway protection as he became lethargic. Prior medical history includes: CAD, inferior MI, s/p CABG, Heart Block AV, Vent. Tachyarcardia, HTN, hyperlipedemia, coronary artery bypass, CHF, arthritis, cardiac cath, Hep C  Patient is currently intubated on ventilator support MV: 9.2 L/min Temp (24hrs), Avg:98.4 F (36.9 C), Min:98.1 F (36.7 C), Max:99.1 F (37.3 C)  10/14 EGD - Mild gastritis throughout Multiple small antral ulcers 1 with flat clot which washed off  OG tube tip in stomach Per MD will start low dose TF. Vital High Protein @ 20 ml/hr (480 kcal, 42 grams protein) With prostat will receive: 780 kcal, 87 grams protein.  Pt weaning but no extubation planned.  Medications: lactulose  Labs reviewed: ammonia 127  Diet Order:  Diet NPO time specified  Skin:  Reviewed, no issues  Last BM:  07/29/2015  Height:   Ht Readings from Last 1 Encounters:  07/28/15 $RemoveB'5\' 6"'ffXuTKbc$  (1.676 m)   Weight:   Wt Readings from Last 1 Encounters:  07/30/15 221 lb 5.5 oz (100.4 kg)  Admission weight: 205 lb (92.9 kg)   Ideal Body Weight:  64.5 kg  BMI:   Body mass index is 35.74 kg/(m^2).  Estimated Nutritional Needs:   Kcal:  3903-0092  Protein:  >/= 129 gm  Fluid:  >/= 1.5 L  EDUCATION NEEDS:   No education needs identified at this time  [place], [place], [place] Pager [phone] After Hours Pager

## 2015-07-30 NOTE — Progress Notes (Signed)
Patient SBP dropped into the 70's.  Precedex turned off and MD called.  MD order to reduce Fentanyl dose and monitor BP until in normal range.

## 2015-07-30 NOTE — Progress Notes (Signed)
PULMONARY / CRITICAL CARE MEDICINE   Name: Hunter Carter MRN: [id number] DOB: [birth year]    ADMISSION DATE:  07/28/2015 CONSULTATION DATE: 10/14   REFERRING MD :  Claudine Mouton EDP  CHIEF COMPLAINT:  Hematemesis  INITIAL PRESENTATION: 64 year old male presented to ED 10/13 with c/o hematemesis and dark stools x 1 day. Hgb was ok initially, but patient had continued bloody emesis. He was transfused and bolused IVF. He was intubated for airway protection as he became lethargic. GI to scope PCCM to see.  PMH - cirrhosis, CAD s/p CABGx3, CHF, HTN, and HLD  STUDIES:   SIGNIFICANT EVENTS: 10/14 - Admit 10/14 EGD  - Mild gastritis throughout Multiple small antral ulcers 1 with flat clot which washed off    REVIEW OF SYSTEMS:  Not obtained due to mental status  SUBJECTIVE:  Afebrile Good UO On max fent gtt HR jumps up with minimal stimulation  VITAL SIGNS: Temp:  [97.4 F (36.3 C)-98.4 F (36.9 C)] 98.3 F (36.8 C) (10/15 0337) Pulse Rate:  [63-114] 104 (10/15 0900) Resp:  [13-24] 16 (10/15 0900) BP: (89-162)/(44-96) 139/75 mmHg (10/15 0900) SpO2:  [95 %-100 %] 99 % (10/15 0900) FiO2 (%):  [40 %] 40 % (10/15 0800) Weight:  [100.4 kg (221 lb 5.5 oz)] 100.4 kg (221 lb 5.5 oz) (10/15 0400) HEMODYNAMICS:   VENTILATOR SETTINGS: Vent Mode:  [-] PRVC FiO2 (%):  [40 %] 40 % Set Rate:  [16 bmp] 16 bmp Vt Set:  [600 mL] 600 mL PEEP:  [5 cmH20] 5 cmH20 Plateau Pressure:  [14 cmH20-24 cmH20] 14 cmH20 INTAKE / OUTPUT:  Intake/Output Summary (Last 24 hours) at 07/30/15 0918 Last data filed at 07/30/15 0900  Gross per 24 hour  Intake 4364.92 ml  Output   1520 ml  Net 2844.92 ml    PHYSICAL EXAMINATION: General:chr ill appearing, intubated Neuro:  Intubated, sedated, does not respond to verbal or physical stimuli HEENT:  NCAT, sclera anicteric Cardiovascular:  RRR, normal s1s2 Lungs:  Intubated, no wheezes or ronchi noted Abdomen:  S, ND. +BS Musculoskeletal:  No edema or  clubbing noted Skin:  Dry, intact  LABS:  CBC  Recent Labs Lab 07/29/15 0051 07/29/15 0713  07/29/15 1439 07/29/15 2213 07/30/15 0243  WBC 5.7 12.6*  --   --   --  8.7  HGB 12.8* 13.4  < > 12.2* 12.3* 11.4*  HCT 36.4* 39.5  < > 36.1* 35.9* 32.8*  PLT PLATELET CLUMPS NOTED ON SMEAR, UNABLE TO ESTIMATE 40*  --   --   --  50*  < > = values in this interval not displayed. Coag's  Recent Labs Lab 07/29/15 0147 07/29/15 1439 07/30/15 0243  APTT 36  --   --   INR 2.28* 1.90* 1.80*   BMET  Recent Labs Lab 07/29/15 0051 07/29/15 0713 07/30/15 0243  NA 137 136 136  K 4.0 4.2 3.8  CL 103 104 106  CO2 28 21* 23  BUN 14 19 22*  CREATININE 0.85 1.15 1.01  GLUCOSE 124* 154* 106*   Electrolytes  Recent Labs Lab 07/29/15 0051 07/29/15 0713 07/30/15 0243  CALCIUM 8.0* 7.3* 7.5*  MG  --  1.3*  --   PHOS  --  3.6  --    Sepsis Markers  Recent Labs Lab 07/29/15 0712 07/29/15 0930 07/29/15 1303  LATICACIDVEN 5.5* 5.1* 4.8*   ABG  Recent Labs Lab 07/29/15 0400  PHART 7.372  PCO2ART 48.5*  PO2ART 479.0*   Liver Enzymes  Recent Labs Lab 07/29/15 0021 07/29/15 0051  AST 47* 41  ALT 26 25  ALKPHOS 101 96  BILITOT 2.5* 2.8*  ALBUMIN 2.1* 2.0*   Cardiac Enzymes  Recent Labs Lab 07/29/15 0713 07/29/15 0930 07/29/15 1303  TROPONINI 0.05* 0.05* 0.07*   Glucose No results for input(s): GLUCAP in the last 168 hours.  Imaging Dg Abd Portable 1v  07/29/2015  CLINICAL DATA:  OG tube placement EXAM: PORTABLE ABDOMEN - 1 VIEW COMPARISON:  None. FINDINGS: OG tube has been placed with the tip in the mid stomach and the side-port in the proximal stomach. Endotracheal tube is approximately 1.6 cm above the carina. Cardiomegaly. Left lower lobe atelectasis or infiltrate. IMPRESSION: OG tube tip in the mid stomach. Endotracheal tube tip approximately 1.6 cm above the carina. Electronically Signed   By: Rolm Baptise M.D.   On: 07/29/2015 13:22     ASSESSMENT  / PLAN:  PULMONARY OETT 10/14 >>> A: Acute Respiratory Failure P:   SBts but no extubation  CARDIOVASCULAR A: Hypotension secondary to acute blood loss-resolved H/o HTN, CAD s/p CABGx3, HLD CHF Lactic acidosis - slow to resolve, ? Poor hepatic clearance SVT with stimulation P:  Drop NS to 50/h   RENAL A:  Hypomag P:   Replete lytes as needed  GASTROINTESTINAL A:   Brisk Upper GI bleed -EGD >> Mild gastritis throughout  Multiple small antral ulcers 1 with flat clot which washed off  Cirrhosis P:   GI consulted  Protonix PID Start low dose TFs Dc octreotide  HEMATOLOGIC A: Acute Blood Loss Anemia Coagulopathy - likely secondary to liver disease s/p  vitamin K and 1U FFP Thrombocytopenia P:  H/H q12h   INFECTIOUS A:  No active Issues P:   Monitor  ENDOCRINE A:  Hyperglycemia P:   Monitor on BMP  NEUROLOGIC A:   Acute encephalopathy - likely hepatic encephalopathy History of alcohol abuse-concern for ETOH ithdrawal P:   Fentanyl gtt RASS goal: 0 WUA Monitor for signs of withdrawal- add precedex gtt Ct lactulose , monitor ammonia  FAMILY  - Updates: sister updated  - Inter-disciplinary family meet or Palliative Care meeting due by: NA   SUMMARY: 64yo male with history of EtOH abuse and cirrhosis presenting with 2 days of hematemesis & high ammonia s/o hepatic encephalopathy, now concern for withdrawal- starting precedex  The patient is critically ill with multiple organ systems failure and requires high complexity decision making for assessment and support, frequent evaluation and titration of therapies, application of advanced monitoring technologies and extensive interpretation of multiple databases. Critical Care Time devoted to patient care services described in this note independent of APP time is 35 minutes.   Rigoberto Noel MD [phone]  07/30/2015 9:18 AM

## 2015-07-31 ENCOUNTER — Inpatient Hospital Stay (HOSPITAL_COMMUNITY): Payer: Medicare Other

## 2015-07-31 DIAGNOSIS — F10231 Alcohol dependence with withdrawal delirium: Secondary | ICD-10-CM

## 2015-07-31 LAB — CBC
HEMATOCRIT: 32.3 % — AB (ref 39.0–52.0)
HEMOGLOBIN: 10.8 g/dL — AB (ref 13.0–17.0)
MCH: 33.5 pg (ref 26.0–34.0)
MCHC: 33.4 g/dL (ref 30.0–36.0)
MCV: 100.3 fL — AB (ref 78.0–100.0)
PLATELETS: 32 10*3/uL — AB (ref 150–400)
RBC: 3.22 MIL/uL — AB (ref 4.22–5.81)
RDW: 15.6 % — ABNORMAL HIGH (ref 11.5–15.5)
WBC: 8.7 10*3/uL (ref 4.0–10.5)

## 2015-07-31 LAB — BASIC METABOLIC PANEL WITH GFR
Anion gap: 5 (ref 5–15)
BUN: 24 mg/dL — ABNORMAL HIGH (ref 6–20)
CO2: 27 mmol/L (ref 22–32)
Calcium: 7.8 mg/dL — ABNORMAL LOW (ref 8.9–10.3)
Chloride: 112 mmol/L — ABNORMAL HIGH (ref 101–111)
Creatinine, Ser: 0.98 mg/dL (ref 0.61–1.24)
GFR calc Af Amer: >60 mL/min
GFR calc non Af Amer: >60 mL/min
Glucose, Bld: 115 mg/dL — ABNORMAL HIGH (ref 65–99)
Potassium: 4.1 mmol/L (ref 3.5–5.1)
Sodium: 144 mmol/L (ref 135–145)

## 2015-07-31 LAB — MAGNESIUM: Magnesium: 1.9 mg/dL (ref 1.7–2.4)

## 2015-07-31 LAB — GLUCOSE, CAPILLARY
GLUCOSE-CAPILLARY: 104 mg/dL — AB (ref 65–99)
GLUCOSE-CAPILLARY: 110 mg/dL — AB (ref 65–99)
Glucose-Capillary: 106 mg/dL — ABNORMAL HIGH (ref 65–99)

## 2015-07-31 LAB — PHOSPHORUS: Phosphorus: 2.5 mg/dL (ref 2.5–4.6)

## 2015-07-31 LAB — AMMONIA: Ammonia: 93 umol/L — ABNORMAL HIGH (ref 9–35)

## 2015-07-31 MED ORDER — ACETAMINOPHEN 160 MG/5ML PO SOLN: 650.0000 mg | ORAL | Status: DC | PRN

## 2015-07-31 NOTE — Progress Notes (Signed)
PULMONARY / CRITICAL CARE MEDICINE   Name: Hunter Carter MRN: [id number] DOB: 1950/10/24    ADMISSION DATE:  07/28/2015 CONSULTATION DATE: 10/14   REFERRING MD :  Claudine Mouton EDP  CHIEF COMPLAINT:  Hematemesis  INITIAL PRESENTATION: 64 year old male presented to ED 10/13 with c/o hematemesis and dark stools x 1 day. Hgb was ok initially, but patient had continued bloody emesis. He was transfused and bolused IVF. He was intubated for airway protection as he became lethargic. GI to scope PCCM to see.  PMH - cirrhosis, CAD s/p CABGx3, CHF, HTN, and HLD  STUDIES:  10/14 EGD  - Mild gastritis throughout Multiple small antral ulcers 1 with flat clot which washed off   SIGNIFICANT EVENTS: 10/14 - Admit 10/15 agitation ? Withdrawal - precedex gtt    REVIEW OF SYSTEMS:  Not obtained due to mental status  SUBJECTIVE:  febrile Good UO No BM yet Off fent gtt Agitated/ HR jumps up with minimal stimulation  VITAL SIGNS: Temp:  [98.4 F (36.9 C)-101.3 F (38.5 C)] 101.3 F (38.5 C) (10/16 0755) Pulse Rate:  [70-130] 78 (10/16 0750) Resp:  [14-28] 16 (10/16 0750) BP: (76-161)/(42-94) 107/53 mmHg (10/16 0750) SpO2:  [95 %-100 %] 99 % (10/16 0750) FiO2 (%):  [30 %] 30 % (10/16 0750) Weight:  [102.8 kg (226 lb 10.1 oz)] 102.8 kg (226 lb 10.1 oz) (10/16 0400) HEMODYNAMICS:   VENTILATOR SETTINGS: Vent Mode:  [-] PSV;CPAP FiO2 (%):  [30 %] 30 % Set Rate:  [16 bmp] 16 bmp Vt Set:  [600 mL] 600 mL PEEP:  [5 cmH20] 5 cmH20 Pressure Support:  [8 cmH20] 8 cmH20 Plateau Pressure:  [20 ONG29-52 cmH20] 23 cmH20 INTAKE / OUTPUT:  Intake/Output Summary (Last 24 hours) at 07/31/15 0823 Last data filed at 07/31/15 0700  Gross per 24 hour  Intake 2643.16 ml  Output   1250 ml  Net 1393.16 ml    PHYSICAL EXAMINATION: General:chr ill appearing, intubated Neuro:  Intubated,eyes open, does not follow commands HEENT:  NCAT, sclera anicteric Cardiovascular:  RRR, normal s1s2 Lungs:  Intubated,  no wheezes or ronchi noted Abdomen:  S, ND. +BS Musculoskeletal:  No edema or clubbing noted Skin:  Dry, intact  LABS:  CBC  Recent Labs Lab 07/29/15 0713  07/29/15 2213 07/30/15 0243 07/31/15 0225  WBC 12.6*  --   --  8.7 8.7  HGB 13.4  < > 12.3* 11.4* 10.8*  HCT 39.5  < > 35.9* 32.8* 32.3*  PLT 40*  --   --  50* 32*  < > = values in this interval not displayed. Coag's  Recent Labs Lab 07/29/15 0147 07/29/15 1439 07/30/15 0243  APTT 36  --   --   INR 2.28* 1.90* 1.80*   BMET  Recent Labs Lab 07/29/15 0713 07/30/15 0243 07/31/15 0225  NA 136 136 144  K 4.2 3.8 4.1  CL 104 106 112*  CO2 21* 23 27  BUN 19 22* 24*  CREATININE 1.15 1.01 0.98  GLUCOSE 154* 106* 115*   Electrolytes  Recent Labs Lab 07/29/15 0713 07/30/15 0243 07/31/15 0225  CALCIUM 7.3* 7.5* 7.8*  MG 1.3*  --  1.9  PHOS 3.6  --  2.5   Sepsis Markers  Recent Labs Lab 07/29/15 0712 07/29/15 0930 07/29/15 1303  LATICACIDVEN 5.5* 5.1* 4.8*   ABG  Recent Labs Lab 07/29/15 0400  PHART 7.372  PCO2ART 48.5*  PO2ART 479.0*   Liver Enzymes  Recent Labs Lab 07/29/15 0021 07/29/15 0051  AST 47* 41  ALT 26 25  ALKPHOS 101 96  BILITOT 2.5* 2.8*  ALBUMIN 2.1* 2.0*   Cardiac Enzymes  Recent Labs Lab 07/29/15 0713 07/29/15 0930 07/29/15 1303  TROPONINI 0.05* 0.05* 0.07*   Glucose  Recent Labs Lab 07/30/15 1616 07/30/15 1953 07/31/15 0002 07/31/15 0357  GLUCAP 103* 89 110* 104*    Imaging No results found.   ASSESSMENT / PLAN:  PULMONARY OETT 10/14 >>> A: Acute Respiratory Failure P:   SBTs but no extubation  CARDIOVASCULAR A: Hypotension secondary to acute blood loss-resolved H/o HTN, CAD s/p CABGx3, HLD CHF Lactic acidosis - slow to resolve, ? Poor hepatic clearance SVT with stimulation P:  Dc NS to 50/h   RENAL A:  Hypomag P:   Replete lytes as needed  GASTROINTESTINAL A:   Brisk Upper GI bleed -EGD >> Mild gastritis throughout   Multiple small antral ulcers 1 with flat clot which washed off  Cirrhosis P:   GI consulted  Protonix bID Ct low dose TFs Dc octreotide Lactulose   HEMATOLOGIC A: Acute Blood Loss Anemia Coagulopathy - likely secondary to liver disease s/p  vitamin K and 1U FFP Thrombocytopenia P:  H/H daily   INFECTIOUS A:  Fever ? etio P:   Blood cx Hold off abx  ENDOCRINE A:  Hyperglycemia P:   Monitor on BMP  NEUROLOGIC A:   Acute encephalopathy - likely hepatic encephalopathy , ammonia decreasing History of alcohol abuse-concern for ETOH withdrawal P:   Fentanyl + precedex gtt RASS goal: 0 WUA Ct lactulose , monitor ammonia  FAMILY  - Updates: sister updated  - Inter-disciplinary family meet or Palliative Care meeting due by: NA   SUMMARY: 64yo male with history of EtOH abuse and cirrhosis presenting with hematemesis & high ammonia s/o hepatic encephalopathy, now concern for ETOH withdrawal- on precedex, no BM yet onlactulose  The patient is critically ill with multiple organ systems failure and requires high complexity decision making for assessment and support, frequent evaluation and titration of therapies, application of advanced monitoring technologies and extensive interpretation of multiple databases. Critical Care Time devoted to patient care services described in this note independent of APP time is 35 minutes.   Rigoberto Noel MD [phone]  07/31/2015 8:23 AM

## 2015-07-31 NOTE — Progress Notes (Signed)
[facility] Progress Note  Subjective: Pt remains intubated. Had another tarry stool this AM, hemodynamicallystable  Objective: Vital signs in last 24 hours: Temp:  [98.4 F (36.9 C)-101.3 F (38.5 C)] 100.8 F (38.2 C) (10/16 1041) Pulse Rate:  [70-133] 85 (10/16 1000) Resp:  [14-28] 14 (10/16 1000) BP: (76-180)/(42-94) 119/70 mmHg (10/16 1000) SpO2:  [95 %-100 %] 99 % (10/16 1000) FiO2 (%):  [30 %] 30 % (10/16 0800) Weight:  [102.8 kg (226 lb 10.1 oz)] 102.8 kg (226 lb 10.1 oz) (10/16 0400) Weight change: 2.4 kg (5 lb 4.7 oz)   JG:[id number]  Lab Results: Results for orders placed or performed during the hospital encounter of 07/28/15 (from the past 24 hour(s))  Glucose, capillary     Status: Abnormal   Collection Time: 07/30/15  4:16 PM  Result Value Ref Range   Glucose-Capillary 103 (H) 65 - 99 mg/dL  Glucose, capillary     Status: None   Collection Time: 07/30/15  7:53 PM  Result Value Ref Range   Glucose-Capillary 89 65 - 99 mg/dL  Glucose, capillary     Status: Abnormal   Collection Time: 07/31/15 12:02 AM  Result Value Ref Range   Glucose-Capillary 110 (H) 65 - 99 mg/dL  Basic metabolic panel     Status: Abnormal   Collection Time: 07/31/15  2:25 AM  Result Value Ref Range   Sodium 144 135 - 145 mmol/L   Potassium 4.1 3.5 - 5.1 mmol/L   Chloride 112 (H) 101 - 111 mmol/L   CO2 27 22 - 32 mmol/L   Glucose, Bld 115 (H) 65 - 99 mg/dL   BUN 24 (H) 6 - 20 mg/dL   Creatinine, Ser 0.98 0.61 - 1.24 mg/dL   Calcium 7.8 (L) 8.9 - 10.3 mg/dL   GFR calc non Af Amer >60 >60 mL/min   GFR calc Af Amer >60 >60 mL/min   Anion gap 5 5 - 15  CBC     Status: Abnormal   Collection Time: 07/31/15  2:25 AM  Result Value Ref Range   WBC 8.7 4.0 - 10.5 K/uL   RBC 3.22 (L) 4.22 - 5.81 MIL/uL   Hemoglobin 10.8 (L) 13.0 - 17.0 g/dL   HCT 32.3 (L) 39.0 - 52.0 %   MCV 100.3 (H) 78.0 - 100.0 fL   MCH 33.5 26.0 - 34.0 pg   MCHC 33.4 30.0 - 36.0 g/dL   RDW 15.6 (H) 11.5 -  15.5 %   Platelets 32 (L) 150 - 400 K/uL  Ammonia     Status: Abnormal   Collection Time: 07/31/15  2:25 AM  Result Value Ref Range   Ammonia 93 (H) 9 - 35 umol/L  Magnesium     Status: None   Collection Time: 07/31/15  2:25 AM  Result Value Ref Range   Magnesium 1.9 1.7 - 2.4 mg/dL  Phosphorus     Status: None   Collection Time: 07/31/15  2:25 AM  Result Value Ref Range   Phosphorus 2.5 2.5 - 4.6 mg/dL  Glucose, capillary     Status: Abnormal   Collection Time: 07/31/15  3:57 AM  Result Value Ref Range   Glucose-Capillary 104 (H) 65 - 99 mg/dL  Glucose, capillary     Status: Abnormal   Collection Time: 07/31/15  7:57 AM  Result Value Ref Range   Glucose-Capillary 106 (H) 65 - 99 mg/dL    Studies/Results: Dg Chest Port 1 View  07/31/2015  CLINICAL DATA:  Respiratory  failure EXAM: PORTABLE CHEST 1 VIEW COMPARISON:  07/30/2015 FINDINGS: Tubular device is stable. Left subclavian pacemaker device and leads stable and intact. Under aerated lungs. Stable opacity at the left base most likely related to volume loss. Right lung is grossly clear. No pneumothorax. IMPRESSION: Stable volume loss at the left base. Electronically Signed   By: Marybelle Killings M.D.   On: 07/31/2015 08:30   Dg Chest Port 1 View  07/30/2015  CLINICAL DATA:  Acute respiratory failure.  Prior CABG.  CHF. EXAM: PORTABLE CHEST 1 VIEW COMPARISON:  1 day prior FINDINGS: Endotracheal tube terminates 2.7 cm above carina. Nasogastric tube terminates at the body of the stomach. Prior median sternotomy. Pacer/AICD device. Normal heart size for level of inspiration. Mild right hemidiaphragm elevation. No pleural effusion or pneumothorax. Low lung volumes with resultant pulmonary interstitial prominence. No lobar consolidation. No congestive failure. Mild left base volume loss. IMPRESSION: Diminished lung volumes with mild left base volume loss and probable atelectasis. Electronically Signed   By: Abigail Miyamoto M.D.   On: 07/30/2015  09:29   Dg Abd Portable 1v  07/29/2015  CLINICAL DATA:  OG tube placement EXAM: PORTABLE ABDOMEN - 1 VIEW COMPARISON:  None. FINDINGS: OG tube has been placed with the tip in the mid stomach and the side-port in the proximal stomach. Endotracheal tube is approximately 1.6 cm above the carina. Cardiomegaly. Left lower lobe atelectasis or infiltrate. IMPRESSION: OG tube tip in the mid stomach. Endotracheal tube tip approximately 1.6 cm above the carina. Electronically Signed   By: Rolm Baptise M.D.   On: 07/29/2015 13:22      Assessment: Antral ulcer bleed with endoscopic stigmata of hemorrhage  Plan: Cont to monitor stools, Hb and VS. Rescope if suspect rebleed    Lauramae Kneisley C 07/31/2015, 11:11 AM  Pager [phone] If no answer or after 5 PM call [phone]

## 2015-08-01 ENCOUNTER — Inpatient Hospital Stay (HOSPITAL_COMMUNITY): Payer: Medicare Other

## 2015-08-01 ENCOUNTER — Encounter (HOSPITAL_COMMUNITY): Payer: Self-pay | Admitting: Gastroenterology

## 2015-08-01 DIAGNOSIS — G9340 Encephalopathy, unspecified: Secondary | ICD-10-CM

## 2015-08-01 LAB — BASIC METABOLIC PANEL
ANION GAP: 4 — AB (ref 5–15)
BUN: 20 mg/dL (ref 6–20)
CALCIUM: 8 mg/dL — AB (ref 8.9–10.3)
CO2: 29 mmol/L (ref 22–32)
Chloride: 112 mmol/L — ABNORMAL HIGH (ref 101–111)
Creatinine, Ser: 0.86 mg/dL (ref 0.61–1.24)
GLUCOSE: 112 mg/dL — AB (ref 65–99)
POTASSIUM: 3.9 mmol/L (ref 3.5–5.1)
Sodium: 145 mmol/L (ref 135–145)

## 2015-08-01 LAB — TYPE AND SCREEN
ABO/RH(D): A POS
Antibody Screen: NEGATIVE
UNIT DIVISION: 0
Unit division: 0

## 2015-08-01 LAB — CBC
HEMATOCRIT: 32.2 % — AB (ref 39.0–52.0)
HEMOGLOBIN: 10.4 g/dL — AB (ref 13.0–17.0)
MCH: 32.9 pg (ref 26.0–34.0)
MCHC: 32.3 g/dL (ref 30.0–36.0)
MCV: 101.9 fL — ABNORMAL HIGH (ref 78.0–100.0)
Platelets: 32 10*3/uL — ABNORMAL LOW (ref 150–400)
RBC: 3.16 MIL/uL — AB (ref 4.22–5.81)
RDW: 15.4 % (ref 11.5–15.5)
WBC: 9 10*3/uL (ref 4.0–10.5)

## 2015-08-01 LAB — MAGNESIUM: MAGNESIUM: 1.8 mg/dL (ref 1.7–2.4)

## 2015-08-01 LAB — GLUCOSE, CAPILLARY
GLUCOSE-CAPILLARY: 102 mg/dL — AB (ref 65–99)
GLUCOSE-CAPILLARY: 77 mg/dL (ref 65–99)
Glucose-Capillary: 99 mg/dL (ref 65–99)

## 2015-08-01 LAB — PHOSPHORUS: PHOSPHORUS: 2 mg/dL — AB (ref 2.5–4.6)

## 2015-08-01 MED ORDER — WHITE PETROLATUM GEL
Status: AC
  Administered 2015-08-01: 16:00:00
  Filled 2015-08-01: qty 1

## 2015-08-01 MED ORDER — FOLIC ACID 5 MG/ML IJ SOLN
1.0000 mg | Freq: Every day | INTRAMUSCULAR | Status: DC
  Administered 2015-08-01 – 2015-08-03 (×3): 1 mg via INTRAVENOUS
  Filled 2015-08-01 (×5): qty 0.2

## 2015-08-01 MED ORDER — THIAMINE HCL 100 MG/ML IJ SOLN
100.0000 mg | Freq: Every day | INTRAMUSCULAR | Status: DC
  Administered 2015-08-01 – 2015-08-03 (×3): 100 mg via INTRAVENOUS
  Filled 2015-08-01 (×3): qty 1

## 2015-08-01 MED ORDER — DEXTROSE 5 % IV SOLN
10.0000 mmol | Freq: Once | INTRAVENOUS | Status: AC
  Administered 2015-08-01: 10 mmol via INTRAVENOUS
  Filled 2015-08-01: qty 3.33

## 2015-08-01 MED ORDER — MAGNESIUM SULFATE 2 GM/50ML IV SOLN
2.0000 g | Freq: Once | INTRAVENOUS | Status: AC
  Administered 2015-08-01: 2 g via INTRAVENOUS
  Filled 2015-08-01: qty 50

## 2015-08-01 MED ORDER — RIFAXIMIN 550 MG PO TABS
550.0000 mg | ORAL_TABLET | Freq: Two times a day (BID) | ORAL | Status: DC
  Administered 2015-08-01 – 2015-08-04 (×7): 550 mg via ORAL
  Filled 2015-08-01 (×7): qty 1

## 2015-08-01 NOTE — Progress Notes (Signed)
  MD bedside re=assessment  Now off sedation x several hours RASs 0 to -1 Gag + Cough + Doing well on SBT A bit deconditionined  A Meets extubation criteria  Plan Extubzate in 30 min aftrer being off tube feeds DC fent, versed, precedex orders NPO for 4h and then ok for meds based on clinical situation   Dr. Brand Males, M.D., [facility].C.P Pulmonary and Critical Care Medicine Staff Physician [facility] Pager: [phone], If no answer or between  15:00h - 7:00h: call [phone]  08/01/2015 2:32 PM

## 2015-08-01 NOTE — Progress Notes (Signed)
Wasted 200 cc's of Fentanyl in sink witnessed by Deboraha Sprang RN

## 2015-08-01 NOTE — Progress Notes (Signed)
PULMONARY / CRITICAL CARE MEDICINE   Name: Hunter Carter MRN: [id number] DOB: 08-18-1951    ADMISSION DATE:  07/28/2015 CONSULTATION DATE: 10/14   REFERRING MD :  Claudine Mouton EDP  CHIEF COMPLAINT:  Hematemesis  INITIAL PRESENTATION: 64 year old male presented to ED 10/13 with c/o hematemesis and dark stools x 1 day. Hgb was ok initially, but patient had continued bloody emesis. He was transfused and bolused IVF. He was intubated for airway protection as he became lethargic. GI to scope PCCM to see.  PMH - cirrhosis, CAD s/p CABGx3, CHF, HTN, and HLD  STUDIES:  10/14 EGD  - Mild gastritis throughout Multiple small antral ulcers 1 with flat clot which washed off   SIGNIFICANT EVENTS: 10/14 - Admit 10/15 agitation ? Withdrawal - precedex gtt  SUBJECTIVE:  Sedated, opens eyes to voice, follows commands. 2 BM yesterday.   VITAL SIGNS: Temp:  [99.2 F (37.3 C)-100.8 F (38.2 C)] 99.5 F (37.5 C) (10/17 0800) Pulse Rate:  [66-101] 66 (10/17 0700) Resp:  [13-20] 16 (10/17 0700) BP: (91-133)/(53-70) 91/56 mmHg (10/17 0700) SpO2:  [95 %-100 %] 100 % (10/17 0700) FiO2 (%):  [30 %] 30 % (10/17 0600) Weight:  [226 lb 3.1 oz (102.6 kg)] 226 lb 3.1 oz (102.6 kg) (10/17 0400) HEMODYNAMICS:   VENTILATOR SETTINGS: Vent Mode:  [-] PRVC FiO2 (%):  [30 %] 30 % Set Rate:  [16 bmp] 16 bmp Vt Set:  [600 mL] 600 mL PEEP:  [5 cmH20] 5 cmH20 Pressure Support:  [8 cmH20] 8 cmH20 Plateau Pressure:  [22 cmH20-23 cmH20] 22 cmH20 INTAKE / OUTPUT:  Intake/Output Summary (Last 24 hours) at 08/01/15 0811 Last data filed at 08/01/15 0700  Gross per 24 hour  Intake   1382 ml  Output   1350 ml  Net     32 ml    PHYSICAL EXAMINATION: General:chronically ill appearing, intubated Neuro: Opens eyes to voice, follows commands HEENT:  NCAT, sclera anicteric Cardiovascular:  RRR, normal s1s2 Lungs:  Intubated, no wheezes or ronchi noted Abdomen:  S, ND. +BS Musculoskeletal:  No edema or clubbing  noted Skin:  Dry, intact  LABS:  CBC  Recent Labs Lab 07/30/15 0243 07/31/15 0225 08/01/15 0252  WBC 8.7 8.7 9.0  HGB 11.4* 10.8* 10.4*  HCT 32.8* 32.3* 32.2*  PLT 50* 32* 32*   Coag's  Recent Labs Lab 07/29/15 0147 07/29/15 1439 07/30/15 0243  APTT 36  --   --   INR 2.28* 1.90* 1.80*   BMET  Recent Labs Lab 07/30/15 0243 07/31/15 0225 08/01/15 0252  NA 136 144 145  K 3.8 4.1 3.9  CL 106 112* 112*  CO2 23 27 29   BUN 22* 24* 20  CREATININE 1.01 0.98 0.86  GLUCOSE 106* 115* 112*   Electrolytes  Recent Labs Lab 07/29/15 0713 07/30/15 0243 07/31/15 0225 08/01/15 0252  CALCIUM 7.3* 7.5* 7.8* 8.0*  MG 1.3*  --  1.9 1.8  PHOS 3.6  --  2.5 2.0*   Sepsis Markers  Recent Labs Lab 07/29/15 0712 07/29/15 0930 07/29/15 1303  LATICACIDVEN 5.5* 5.1* 4.8*   ABG  Recent Labs Lab 07/29/15 0400  PHART 7.372  PCO2ART 48.5*  PO2ART 479.0*   Liver Enzymes  Recent Labs Lab 07/29/15 0021 07/29/15 0051  AST 47* 41  ALT 26 25  ALKPHOS 101 96  BILITOT 2.5* 2.8*  ALBUMIN 2.1* 2.0*   Cardiac Enzymes  Recent Labs Lab 07/29/15 0713 07/29/15 0930 07/29/15 1303  TROPONINI 0.05* 0.05* 0.07*  Glucose  Recent Labs Lab 07/30/15 1616 07/30/15 1953 07/31/15 0002 07/31/15 0357 07/31/15 0757 08/01/15 0009  GLUCAP 103* 89 110* 104* 106* 102*    Imaging Dg Chest Port 1 View  08/01/2015  CLINICAL DATA:  Subsequent encounter for acute respiratory failure. EXAM: PORTABLE CHEST 1 VIEW COMPARISON:  Multiple recent previous exams. FINDINGS: 0446 hours. Endotracheal tube tip is approximately 4.7 cm above the base of the carina. The NG tube passes into the stomach although the distal tip position is not included on the film. Left-sided permanent pacemaker remains in place. Lung volumes are low with persistent left base collapse/ consolidation. No evidence for pulmonary edema. Question tiny left pleural effusion. Telemetry leads overlie the chest. IMPRESSION:  No substantial change. Opacity at the left base likely secondary to atelectasis although infection not excluded. Electronically Signed   By: Misty Stanley M.D.   On: 08/01/2015 07:03     ASSESSMENT / PLAN:  PULMONARY OETT 10/14 >>> A: Acute Respiratory Failure P:   Vent support SBTs but no extubation  CARDIOVASCULAR A: Hypotension secondary to acute blood loss-resolved H/o HTN, CAD s/p CABGx3, HLD CHF Lactic acidosis - slow to resolve, ? Poor hepatic clearance SVT with stimulation P:  CV monitoring Dc NS to 50/h  RENAL A:  Hypomag P:   Replete lytes as needed Trend BMP  GASTROINTESTINAL A:   Brisk Upper GI bleed -EGD >> Mild gastritis throughout multiple small antral ulcers 1 with flat clot which washed off  Cirrhosis P:   GI consulted  Protonix bID Continue low dose TFs Dc octreotide Continue lactulose   HEMATOLOGIC A: Acute Blood Loss Anemia Coagulopathy - likely secondary to liver disease s/p  vitamin K and 1U FFP Thrombocytopenia P:  H/H daily  INFECTIOUS A:  Fever ? etio P:   Blood cx 10/16 >>> Hold off abx  ENDOCRINE A:  Hyperglycemia P:   Monitor on BMP  NEUROLOGIC A:   Acute encephalopathy - likely hepatic encephalopathy , ammonia decreasing History of alcohol abuse-concern for ETOH withdrawal P:   Fentanyl + precedex gtt RASS goal: 0 WUA Lactulose  FAMILY  - Updates: sister updated 07/31/2015  - Inter-disciplinary family meet or Palliative Care meeting due by: NA  Algis Greenhouse. Jerline Pain, [place] Resident PGY-2 08/01/2015 8:15 AM

## 2015-08-01 NOTE — Procedures (Signed)
Extubation Procedure Note  Patient Details:   Name: Levy Cedano DOB: 1951/02/08 MRN: [id number]   Airway Documentation:     Evaluation  O2 sats: stable throughout Complications: No apparent complications Patient did tolerate procedure well. Bilateral Breath Sounds: Other (Comment) (coarse) Suctioning: Airway Yes   Patient vitals WNL. Placed on 4L [place] and tolerating well. Respiratory will continue to monitor.  Beatris Si D 08/01/2015, 3:12 PM

## 2015-08-01 NOTE — Progress Notes (Signed)
Patient ID: Hunter Carter, male   DOB: July 05, 1951, 64 y.o.   MRN: [id number] [facility] Progress Note  Hunter Carter 64 y.o. [birth year]   Subjective: Intubated. No bleeding overnight.  Objective: Vital signs in last 24 hours: Filed Vitals:   08/01/15 1151  BP: 119/56  Pulse: 73  Temp: 98.4 F (36.9 C)  Resp: 16    Physical Exam: Gen: intubated, sedated CV: RRR Chest: Coarse breath sounds Abd: +distention, nontender, +BS Skin: dry  Lab Results:  Recent Labs  07/31/15 0225 08/01/15 0252  NA 144 145  K 4.1 3.9  CL 112* 112*  CO2 27 29  GLUCOSE 115* 112*  BUN 24* 20  CREATININE 0.98 0.86  CALCIUM 7.8* 8.0*  MG 1.9 1.8  PHOS 2.5 2.0*   No results for input(s): AST, ALT, ALKPHOS, BILITOT, PROT, ALBUMIN in the last 72 hours.  Recent Labs  07/31/15 0225 08/01/15 0252  WBC 8.7 9.0  HGB 10.8* 10.4*  HCT 32.3* 32.2*  MCV 100.3* 101.9*  PLT 32* 32*    Recent Labs  07/29/15 1439 07/30/15 0243  LABPROT 21.7* 20.8*  INR 1.90* 1.80*      Assessment/Plan: 64 yo with cirrhosis and recent gastric ulcer bleed without evidence of further bleeding. Hgb stable. Continue IV PPI Q 12 hours. When extubated can advance diet slowly. Continue supportive care. Will sign off. Call if questions.   [place] C. 08/01/2015, 12:45 PM  Pager [phone]  If no answer or after 5 PM call [phone]

## 2015-08-02 ENCOUNTER — Inpatient Hospital Stay (HOSPITAL_COMMUNITY): Payer: Medicare Other

## 2015-08-02 DIAGNOSIS — I10 Essential (primary) hypertension: Secondary | ICD-10-CM

## 2015-08-02 DIAGNOSIS — E669 Obesity, unspecified: Secondary | ICD-10-CM

## 2015-08-02 DIAGNOSIS — G9340 Encephalopathy, unspecified: Secondary | ICD-10-CM

## 2015-08-02 DIAGNOSIS — R5381 Other malaise: Secondary | ICD-10-CM

## 2015-08-02 DIAGNOSIS — K922 Gastrointestinal hemorrhage, unspecified: Secondary | ICD-10-CM

## 2015-08-02 LAB — BASIC METABOLIC PANEL
ANION GAP: 6 (ref 5–15)
BUN: 15 mg/dL (ref 6–20)
CALCIUM: 8.2 mg/dL — AB (ref 8.9–10.3)
CO2: 28 mmol/L (ref 22–32)
Chloride: 113 mmol/L — ABNORMAL HIGH (ref 101–111)
Creatinine, Ser: 0.77 mg/dL (ref 0.61–1.24)
Glucose, Bld: 96 mg/dL (ref 65–99)
Potassium: 3.8 mmol/L (ref 3.5–5.1)
Sodium: 147 mmol/L — ABNORMAL HIGH (ref 135–145)

## 2015-08-02 LAB — CBC
HEMATOCRIT: 32.8 % — AB (ref 39.0–52.0)
Hemoglobin: 10.5 g/dL — ABNORMAL LOW (ref 13.0–17.0)
MCH: 32.5 pg (ref 26.0–34.0)
MCHC: 32 g/dL (ref 30.0–36.0)
MCV: 101.5 fL — ABNORMAL HIGH (ref 78.0–100.0)
PLATELETS: 38 10*3/uL — AB (ref 150–400)
RBC: 3.23 MIL/uL — AB (ref 4.22–5.81)
RDW: 15.2 % (ref 11.5–15.5)
WBC: 8.8 10*3/uL (ref 4.0–10.5)

## 2015-08-02 LAB — GLUCOSE, CAPILLARY
Glucose-Capillary: 75 mg/dL (ref 65–99)
Glucose-Capillary: 74 mg/dL (ref 65–99)
Glucose-Capillary: 100 mg/dL — ABNORMAL HIGH (ref 65–99)
Glucose-Capillary: 110 mg/dL — ABNORMAL HIGH (ref 65–99)

## 2015-08-02 LAB — MAGNESIUM: MAGNESIUM: 1.9 mg/dL (ref 1.7–2.4)

## 2015-08-02 LAB — CULTURE, RESPIRATORY W GRAM STAIN

## 2015-08-02 LAB — PHOSPHORUS: PHOSPHORUS: 3.3 mg/dL (ref 2.5–4.6)

## 2015-08-02 LAB — LACTIC ACID, PLASMA: Lactic Acid, Venous: 1.1 mmol/L (ref 0.5–2.0)

## 2015-08-02 LAB — CULTURE, RESPIRATORY: SPECIAL REQUESTS: NORMAL

## 2015-08-02 MED ORDER — ACETAMINOPHEN 325 MG PO TABS
650.0000 mg | ORAL_TABLET | ORAL | Status: DC | PRN
  Administered 2015-08-02: 650 mg via ORAL
  Filled 2015-08-02: qty 2

## 2015-08-02 MED ORDER — DEXTROSE 5 % IV SOLN
5.0000 mg | Freq: Once | INTRAVENOUS | Status: AC
  Administered 2015-08-02: 5 mg via INTRAVENOUS
  Filled 2015-08-02: qty 0.5

## 2015-08-02 NOTE — Evaluation (Addendum)
Physical Therapy Evaluation Patient Details Name: Hunter Carter MRN: [id number] DOB: [birth year] Today's Date: 08/02/2015   History of Present Illness  Patient is a 64 y/o male presents with c/o of hematemesis and dark stools. He gradually became more hypotensive with AMS while in the ED and required pressors and then required intubation for airway protection. Admitted with Acute resp failure due to acute encephalopathy and GI bleed. Extubated 10/17. PMH includes cirrhosis, CAD s/p CABGx3, CHF, HTN, and HLD.  Clinical Impression  Patient presents with generalized weakness, deconditioning and impaired cardiovascular endurance impacting safe mobility. Pt with poor safety awareness and impaired problem solving. Difficulty following simple 2 step commands. Pt not a great historian and confused today. Appears to have good support per report. Tolerated taking a few steps to the chair with Mod A of 2. Will follow acutely to maximize independence and mobility. Pt independent PTA and would benefit from CIR to return to PLOF.    Follow Up Recommendations CIR    Equipment Recommendations  Other (comment) (TBD)    Recommendations for Other Services OT consult     Precautions / Restrictions Precautions Precautions: Fall Restrictions Weight Bearing Restrictions: No      Mobility  Bed Mobility Overal bed mobility: Needs Assistance Bed Mobility: Supine to Sit     Supine to sit: Min assist;HOB elevated     General bed mobility comments: Increased time and effort. Cues for technique. Min A to elevate trunk.  Transfers Overall transfer level: Needs assistance Equipment used: 2 person hand held assist Transfers: Sit to/from Stand Sit to Stand: Mod assist;+2 physical assistance;+2 safety/equipment         General transfer comment: Assist of 2 to boost from EOB with cues for hand placement and technique. Increased time. + dizziness standing. Transferred to chair Mod A of  2.  Ambulation/Gait Ambulation/Gait assistance: Mod assist;+2 physical assistance;+2 safety/equipment Ambulation Distance (Feet): 2 Feet Assistive device: 2 person hand held assist Gait Pattern/deviations: Step-to pattern;Wide base of support;Shuffle;Decreased step length - right;Decreased step length - left Gait velocity: decreased   General Gait Details: Able to shuffle feet a few steps to chair with Mod A of 2 for balance/weight shiftting. Difficulty advancing LLE>RLE. increased WB through therapist's hands for support. Sp02 dropped to mid 80s on RA however resolved quickly.  Stairs            Wheelchair Mobility    Modified Rankin (Stroke Patients Only)       Balance Overall balance assessment: Needs assistance Sitting-balance support: Feet supported;Bilateral upper extremity supported Sitting balance-Leahy Scale: Fair     Standing balance support: During functional activity Standing balance-Leahy Scale: Poor Standing balance comment: Relient on hand held support for safety and external support.                             Pertinent Vitals/Pain Pain Assessment: Faces Faces Pain Scale: Hurts little more Pain Location: LLE Pain Descriptors / Indicators: Sore Pain Intervention(s): Monitored during session;Repositioned   Vitals BP 157/73 EOB; HR increased to 125 bpm during mobility; Sp02 ranged from 83-94% on RA.    Home Living Family/patient expects to be discharged to:: Private residence Living Arrangements: Children Available Help at Discharge: Family;Available PRN/intermittently Type of Home: House Home Access: Ramped entrance     Home Layout: One level Home Equipment: None      Prior Function Level of Independence: Independent         Comments:  Unsure of accuracy of reported PLOF/history as pt confused. Reports performing IADls and riding lawn mower. States has very supportive family but kids work part time during the day.     Hand  Dominance        Extremity/Trunk Assessment   Upper Extremity Assessment: Defer to OT evaluation;Generalized weakness           Lower Extremity Assessment: Generalized weakness         Communication   Communication: No difficulties  Cognition Arousal/Alertness: Awake/alert Behavior During Therapy: WFL for tasks assessed/performed Overall Cognitive Status: Impaired/Different from baseline Area of Impairment: Safety/judgement;Problem solving;Awareness;Following commands;Orientation Orientation Level: Disoriented to;Time;Situation     Following Commands: Follows one step commands with increased time;Follows one step commands inconsistently Safety/Judgement: Decreased awareness of safety;Decreased awareness of deficits Awareness: Intellectual Problem Solving: Requires verbal cues;Requires tactile cues;Slow processing General Comments: Pt masks confusion with humor very well. Not able to state why in hospital.    General Comments General comments (skin integrity, edema, etc.): Son arrived towards end of session.    Exercises General Exercises - Lower Extremity Ankle Circles/Pumps: Both;10 reps;Supine Long Arc Quad: Both;10 reps;Seated      Assessment/Plan    PT Assessment Patient needs continued PT services  PT Diagnosis Difficulty walking;Generalized weakness   PT Problem List Decreased strength;Cardiopulmonary status limiting activity;Decreased cognition;Decreased activity tolerance;Decreased balance;Decreased mobility;Decreased safety awareness  PT Treatment Interventions Balance training;Gait training;Functional mobility training;Therapeutic activities;Therapeutic exercise;Patient/family education   PT Goals (Current goals can be found in the Care Plan section) Acute Rehab PT Goals Patient Stated Goal: to be able to walk PT Goal Formulation: With patient Time For Goal Achievement: 08/16/15 Potential to Achieve Goals: Fair    Frequency Min 3X/week   Barriers to  discharge Decreased caregiver support Not sure of level of support at home    Co-evaluation               End of Session Equipment Utilized During Treatment: Gait belt Activity Tolerance: Patient limited by fatigue Patient left: in chair;with call bell/phone within reach;with family/visitor present Nurse Communication: Mobility status         Time: 7737-3668 PT Time Calculation (min) (ACUTE ONLY): 33 min   Charges:   PT Evaluation $Initial PT Evaluation Tier I: 1 Procedure PT Treatments $Therapeutic Activity: 8-22 mins   PT G Codes:        Dameisha Tschida A Arlee Bossard 08/02/2015, 11:19 AM Wray Kearns, PT, DPT [phone]

## 2015-08-02 NOTE — Evaluation (Signed)
Clinical/Bedside Swallow Evaluation Patient Details  Name: Hunter Carter MRN: [id number] Date of Birth: Sep 17, 1951  Today's Date: 08/02/2015 Time: SLP Start Time (ACUTE ONLY): 1435 SLP Stop Time (ACUTE ONLY): 1505 SLP Time Calculation (min) (ACUTE ONLY): 30 min  Past Medical History:  Past Medical History  Diagnosis Date  . CAD (coronary artery disease)   . Inferior MI ([place]) 2005  . S/P CABG x 3 02/10/2003    LIMA to LAD, SVG to first diagonal,SVG to OM1  . Heart block AV second degree     permanent pacemaker 02/2004  . Ventricular tachycardia ([facility])     nonsustained  . Systemic hypertension   . Hyperlipidemia   . RBBB   . Arthritis   . CHF (congestive heart failure) ([place])   . ETOH abuse    Past Surgical History:  Past Surgical History  Procedure Laterality Date  . Coronary artery bypass graft  02/10/03    LIMA to LAD,SVG to first diagonal, SVT to OM1  . Permanent pacemaker insertion  03/28/2010    [employer]  . Cardiac catheterization  03/06/04    significant 3 vessel disease, totalled graft obtuse marginal  . Knee arthroscopy Left [phone]  . US echocardiography  01/20/07    mild MR,mild to mod. TR,trace AI,mild PI  . Nm myocar perf wall motion  04/05/09    low risk scan-abnormal-mod. perfusion defect basal inferoseptal,basal inferior,mid inferoseptal,mid inferior and apical inferior regions  . Esophagogastroduodenoscopy N/A 07/29/2015    Procedure: ESOPHAGOGASTRODUODENOSCOPY (EGD);  Surgeon: Clarene Essex, MD;  Location[facility];  Service: Endoscopy;  Laterality: N/A;   HPI:  64 year old male admitted 07/28/15 with hematemesis and dark stool. PMH significant ofr cirrhosis, UGI bleed, CAD s/p CABG. ETT 10/14-17/16.   Assessment / Plan / Recommendation Clinical Impression  Pt presents with adequate oral motor strength and function. Speech is intelligible, but slow. Self feeding is functional, however, appears labored and difficult. No overt s/s aspiration observed  with any consistency tested, however, intubation for 4 days increases risk of dysphagia. Pt reports no history of swallowing difficulty or high risk indicators prior to admit. Will begin dys 2 diet for energy conservation, thin liquids. ST to follow for diet tolerance, and to determine if objective study is warranted.    Aspiration Risk  Moderate    Diet Recommendation Dysphagia 2 (Fine chop);Thin   Medication Administration: Whole meds with liquid Compensations: Minimize environmental distractions;Small sips/bites;Slow rate    Other  Recommendations Oral Care Recommendations: Oral care BID   Follow Up Recommendations    ST to follow for diet tolerance assessment.  Frequency and Duration min 1 x/week  1 week   Pertinent Vitals/Pain VSS, pain 7/10 (sore throat s/p extubation)    SLP Swallow Goals  Tolerance of least restrictive diet without overt s/s aspiration or decline in respiratory status.   Swallow Study Prior Functional Status   Regular diet/thin liquids prior to admit.     General Date of Onset: 07/28/15 Other Pertinent Information: 64 year old male admitted 07/28/15 with hematemesis and dark stool. PMH significant ofr cirrhosis, UGI bleed, CAD s/p CABG. ETT 10/14-17/16. Type of Study: Bedside swallow evaluation Previous Swallow Assessment: none found Diet Prior to this Study: NPO Temperature Spikes Noted: No Respiratory Status: Supplemental O2 delivered via (comment) History of Recent Intubation: Yes Length of Intubations (days): 4 days Date extubated: 08/01/15 Behavior/Cognition: Alert;Cooperative;Pleasant mood;Lethargic/Drowsy Oral Cavity - Dentition: Adequate natural dentition/normal for age Self-Feeding Abilities: Needs assist Patient Positioning: Upright in bed Baseline  Vocal Quality: Normal Volitional Cough: Strong Volitional Swallow: Able to elicit    Oral/Motor/Sensory Function Overall Oral Motor/Sensory Function: Appears within functional limits for tasks  assessed   Ice Chips Ice chips: Within functional limits Presentation: Spoon   Thin Liquid Thin Liquid: Within functional limits Presentation: Straw    Nectar Thick Nectar Thick Liquid: Not tested   Honey Thick Honey Thick Liquid: Not tested   Puree Puree: Within functional limits Presentation: Spoon   Solid   GO    Solid: Within functional limits Presentation: [place], [place] 08/02/2015,3:15 PM  Enriqueta Shutter. Quentin Ore [facility], [place] [phone]

## 2015-08-02 NOTE — Progress Notes (Signed)
Inpatient Rehabilitation  Patient was screened by Egan Sahlin for appropriateness for an Inpatient Acute Rehab consult.  At this time, we are recommending Inpatient Rehab consult.  Please order when you feel appropriate.   Gerlene Glassburn PT Inpatient Rehab Admissions Coordinator Cell [phone] Office [phone]   

## 2015-08-02 NOTE — Consult Note (Signed)
Physical Medicine and Rehabilitation Consult Reason for Consult: Deconditioning/acute respiratory failure/acute encephalopathy Referring Physician: Critical care   HPI: Hunter Carter is a 64 y.o. right handed male with history of CAD with CABG 2004, chronic thrombocytopenia 40,000, AV second degree heart block with permanent pacemaker, diastolic congestive heart failure, alcohol abuse and hepatitis C. Patient lives with family reported to be independent prior to admission 1 level home . Presented 07/29/2015 with hematemesis and dark stools 1 day as well as hypotension. Elevated ammonia levels 376 maintained on lactulose latest ammonia 93. Required intubation for airway protection. Underwent endoscopy per Dr. Watt Climes showing no esophageal varices mild gastritis multiple small antral ulcers with one flat clot otherwise normal study. He was extubated 08/01/2015. Noted bouts of confusion that has steadily improved, suspect acute encephalopathy. Physical therapy evaluation 08/02/2015 noted deconditioning. On discharge, pt will live with his son and daughter, either of whom will be home 24/7.     Review of Systems  Constitutional: Negative for fever and chills.  HENT: Negative for hearing loss.   Eyes: Negative for blurred vision and double vision.  Respiratory: Negative for cough and shortness of breath.   Cardiovascular: Positive for leg swelling. Negative for chest pain and palpitations.  Gastrointestinal: Positive for abdominal pain and constipation.  Genitourinary: Negative for dysuria and hematuria.  Musculoskeletal: Positive for myalgias and joint pain.  Skin: Negative for rash.  Neurological: Positive for dizziness, weakness and headaches. Negative for seizures and loss of consciousness.  All other systems reviewed and are negative.  Past Medical History  Diagnosis Date  . CAD (coronary artery disease)   . Inferior MI ([place]) 2005  . S/P CABG x 3 02/10/2003    LIMA to LAD, SVG to  first diagonal,SVG to OM1  . Heart block AV second degree     permanent pacemaker 02/2004  . Ventricular tachycardia (HCC)     nonsustained  . Systemic hypertension   . Hyperlipidemia   . RBBB   . Arthritis   . CHF (congestive heart failure) ([place])   . ETOH abuse    Past Surgical History  Procedure Laterality Date  . Coronary artery bypass graft  02/10/03    LIMA to LAD,SVG to first diagonal, SVT to OM1  . Permanent pacemaker insertion  03/28/2010    [employer]  . Cardiac catheterization  03/06/04    significant 3 vessel disease, totalled graft obtuse marginal  . Knee arthroscopy Left [phone]  . US echocardiography  01/20/07    mild MR,mild to mod. TR,trace AI,mild PI  . Nm myocar perf wall motion  04/05/09    low risk scan-abnormal-mod. perfusion defect basal inferoseptal,basal inferior,mid inferoseptal,mid inferior and apical inferior regions  . Esophagogastroduodenoscopy N/A 07/29/2015    Procedure: ESOPHAGOGASTRODUODENOSCOPY (EGD);  Surgeon: Clarene Essex, MD;  Location: [facility] ENDOSCOPY;  Service: Endoscopy;  Laterality: N/A;   Family History  Problem Relation Age of Onset  . Heart failure Mother   . Heart failure Sister   . Hypertension Sister   . Hypertension Sister   . Liver disease Father   . Colon cancer Neg Hx    Social History:  reports that he quit smoking about 12 years ago. He does not have any smokeless tobacco history on file. He reports that he does not drink alcohol or use illicit drugs. Allergies: No Known Allergies Medications Prior to Admission  Medication Sig Dispense Refill  . amLODipine (NORVASC) 5 MG tablet Take 1 tablet (5 mg total) by mouth daily.  30 tablet 1  . aspirin 81 MG tablet Take 81 mg by mouth daily.    Marland Kitchen erythromycin ophthalmic ointment Place 1 application into the left eye every 6 (six) hours. Place 1/2 inch ribbon of ointment in the affected eye 4 times a day 3.5 g 1  . metoprolol (LOPRESSOR) 50 MG tablet Take 1 tablet (50 mg total) by mouth  2 (two) times daily. NEEDS APPOINTMENT FOR FUTURE REFILLS 30 tablet 0  . simvastatin (ZOCOR) 20 MG tablet Take 1 tablet (20 mg total) by mouth daily. <MUST KEEP APPOINTMENT OCT. 24TH> 30 tablet 1  . valsartan-hydrochlorothiazide (DIOVAN-HCT) 320-25 MG per tablet Take 1 tablet by mouth daily. PATIENT NEEDS TO CONTACT OFFICE FOR ADDITIONAL REFILLS 30 tablet 0    Home: Home Living Family/patient expects to be discharged to:: Private residence Living Arrangements: Children Available Help at Discharge: Family, Available PRN/intermittently Type of Home: House Home Access: Ramped entrance Home Layout: One level Home Equipment: None  Functional History: Prior Function Level of Independence: Independent Comments: Unsure of accuracy of reported PLOF/history as pt confused. Reports performing IADls and riding lawn mower. States has very supportive family but kids work part time during the day. Functional Status:  Mobility: Bed Mobility Overal bed mobility: Needs Assistance Bed Mobility: Supine to Sit Supine to sit: Min assist, HOB elevated General bed mobility comments: Increased time and effort. Cues for technique. Min A to elevate trunk. Transfers Overall transfer level: Needs assistance Equipment used: 2 person hand held assist Transfers: Sit to/from Stand Sit to Stand: Mod assist, +2 physical assistance, +2 safety/equipment General transfer comment: Assist of 2 to boost from EOB with cues for hand placement and technique. Increased time. + dizziness standing. Transferred to chair Mod A of 2. Ambulation/Gait Ambulation/Gait assistance: Mod assist, +2 physical assistance, +2 safety/equipment Ambulation Distance (Feet): 2 Feet Assistive device: 2 person hand held assist Gait Pattern/deviations: Step-to pattern, Wide base of support, Shuffle, Decreased step length - right, Decreased step length - left General Gait Details: Able to shuffle feet a few steps to chair with Mod A of 2 for  balance/weight shiftting. Difficulty advancing LLE>RLE. increased WB through therapist's hands for support. Sp02 dropped to mid 80s on RA however resolved quickly. Gait velocity: decreased    ADL:    Cognition: Cognition Overall Cognitive Status: Impaired/Different from baseline Orientation Level: Oriented to person, Oriented to place, Disoriented to time, Disoriented to situation Cognition Arousal/Alertness: Awake/alert Behavior During Therapy: WFL for tasks assessed/performed Overall Cognitive Status: Impaired/Different from baseline Area of Impairment: Safety/judgement, Problem solving, Awareness, Following commands, Orientation Orientation Level: Disoriented to, Time, Situation Following Commands: Follows one step commands with increased time, Follows one step commands inconsistently Safety/Judgement: Decreased awareness of safety, Decreased awareness of deficits Awareness: Intellectual Problem Solving: Requires verbal cues, Requires tactile cues, Slow processing General Comments: Pt masks confusion with humor very well. Not able to state why in hospital.  Blood pressure 137/65, pulse 81, temperature 98 F (36.7 C), temperature source Oral, resp. rate 19, height 5\' 6"  (1.676 m), weight 102.3 kg (225 lb 8.5 oz), SpO2 93 %. Physical Exam  Vitals reviewed. Constitutional: He is oriented to person, place, and time. He appears well-developed and well-nourished.  HENT:  Head: Normocephalic and atraumatic.  Eyes: Conjunctivae and EOM are normal.  Pupils sluggish to light  Neck: Normal range of motion. Neck supple. No thyromegaly present.  Cardiovascular: Normal rate.   Murmur (?) heard. Cardiac rate controlled  Respiratory: Effort normal.  Decreased breath sounds at bases  with limited inspiratory effort  GI: Soft. Bowel sounds are normal. There is no tenderness.  Musculoskeletal: He exhibits edema (Extremities). He exhibits no tenderness.  Strength 4-/5 grossly throughout    Neurological: He is alert and oriented to person, place, and time.  Sensation to light touch intact  Skin: Skin is warm and dry.  Psychiatric: He has a normal mood and affect. His behavior is normal.    Results for orders placed or performed during the hospital encounter of 07/28/15 (from the past 24 hour(s))  Glucose, capillary     Status: None   Collection Time: 08/01/15  3:27 PM  Result Value Ref Range   Glucose-Capillary 99 65 - 99 mg/dL  Glucose, capillary     Status: None   Collection Time: 08/01/15  7:24 PM  Result Value Ref Range   Glucose-Capillary 77 65 - 99 mg/dL  CBC     Status: Abnormal   Collection Time: 08/02/15  2:20 AM  Result Value Ref Range   WBC 8.8 4.0 - 10.5 K/uL   RBC 3.23 (L) 4.22 - 5.81 MIL/uL   Hemoglobin 10.5 (L) 13.0 - 17.0 g/dL   HCT 32.8 (L) 39.0 - 52.0 %   MCV 101.5 (H) 78.0 - 100.0 fL   MCH 32.5 26.0 - 34.0 pg   MCHC 32.0 30.0 - 36.0 g/dL   RDW 15.2 11.5 - 15.5 %   Platelets 38 (L) 150 - 400 K/uL  Basic metabolic panel     Status: Abnormal   Collection Time: 08/02/15  2:20 AM  Result Value Ref Range   Sodium 147 (H) 135 - 145 mmol/L   Potassium 3.8 3.5 - 5.1 mmol/L   Chloride 113 (H) 101 - 111 mmol/L   CO2 28 22 - 32 mmol/L   Glucose, Bld 96 65 - 99 mg/dL   BUN 15 6 - 20 mg/dL   Creatinine, Ser 0.77 0.61 - 1.24 mg/dL   Calcium 8.2 (L) 8.9 - 10.3 mg/dL   GFR calc non Af Amer >60 >60 mL/min   GFR calc Af Amer >60 >60 mL/min   Anion gap 6 5 - 15  Lactic acid, plasma     Status: None   Collection Time: 08/02/15  2:20 AM  Result Value Ref Range   Lactic Acid, Venous 1.1 0.5 - 2.0 mmol/L  Magnesium     Status: None   Collection Time: 08/02/15  2:20 AM  Result Value Ref Range   Magnesium 1.9 1.7 - 2.4 mg/dL  Phosphorus     Status: None   Collection Time: 08/02/15  2:20 AM  Result Value Ref Range   Phosphorus 3.3 2.5 - 4.6 mg/dL  Glucose, capillary     Status: None   Collection Time: 08/02/15  7:50 AM  Result Value Ref Range    Glucose-Capillary 75 65 - 99 mg/dL   Dg Chest Port 1 View  08/02/2015  CLINICAL DATA:  Respiratory failure . EXAM: PORTABLE CHEST 1 VIEW COMPARISON:  None. FINDINGS: Cardiac pacer noted in stable position. Prior CABG. Cardiomegaly. Bibasilar subsegmental atelectasis and/or infiltrates with small pleural effusions. No pneumothorax. IMPRESSION: 1. Cardiac pacer in stable position.  Prior CABG.  Cardiomegaly. 2. Bibasilar atelectasis and/or infiltrates with small bilateral effusions. A component of congestive heart failure cannot be excluded . Electronically Signed   By: Marcello Moores  Register   On: 08/02/2015 07:19   Dg Chest Port 1 View  08/01/2015  CLINICAL DATA:  Subsequent encounter for acute respiratory failure. EXAM: PORTABLE CHEST  1 VIEW COMPARISON:  Multiple recent previous exams. FINDINGS: 0446 hours. Endotracheal tube tip is approximately 4.7 cm above the base of the carina. The NG tube passes into the stomach although the distal tip position is not included on the film. Left-sided permanent pacemaker remains in place. Lung volumes are low with persistent left base collapse/ consolidation. No evidence for pulmonary edema. Question tiny left pleural effusion. Telemetry leads overlie the chest. IMPRESSION: No substantial change. Opacity at the left base likely secondary to atelectasis although infection not excluded. Electronically Signed   By: Misty Stanley M.D.   On: 08/01/2015 07:03    Assessment/Plan: Diagnosis: Debility Labs and images independently reviewed.  Old records reviewed and summated above.  1. Does the need for close, 24 hr/day medical supervision in concert with the patient's rehab needs make it unreasonable for this patient to be served in a less intensive setting? Yes 2. Co-Morbidities requiring supervision/potential complications: Hypernatremia (cont to monitor, mild at present), anemia (cont to monitor), HTN (meds per primary team), CAD (cont meds), Hep C, obesity (dietician  consult) 3. Due to safety, disease management, medication administration, pain management and patient education, does the patient require 24 hr/day rehab nursing? Yes 4. Does the patient require coordinated care of a physician, rehab nurse, PT (1-2 hrs/day, 5 days/week), OT (1-2 hrs/day, 5 days/week) and SLP (1-2 hrs/day, 5 days/week) to address physical and functional deficits in the context of the above medical diagnosis(es)? Yes Addressing deficits in the following areas: balance, endurance, locomotion, strength, transferring, dressing, toileting and psychosocial support 5. Can the patient actively participate in an intensive therapy program of at least 3 hrs of therapy per day at least 5 days per week? Yes 6. The potential for patient to make measurable gains while on inpatient rehab is excellent and good 7. Anticipated functional outcomes upon discharge from inpatient rehab are modified independent and supervision  with PT, modified independent and supervision with OT, independent with SLP. 8. Estimated rehab length of stay to reach the above functional goals is: 7-10 days.  9. Does the patient have adequate social supports and living environment to accommodate these discharge functional goals? Yes 10. Anticipated D/C setting: Home 11. Anticipated post D/C treatments: HH therapy and Home excercise program 12. Overall Rehab/Functional Prognosis: excellent and good  RECOMMENDATIONS: This patient's condition is appropriate for continued rehabilitative care in the following setting: CIR Patient has agreed to participate in recommended program. Yes Note that insurance prior authorization may be required for reimbursemnt for recommended care.  Comment: Rehab Admissions Coordinator to follow up.   Delice Lesch, MD 08/02/2015

## 2015-08-02 NOTE — Progress Notes (Signed)
PULMONARY / CRITICAL CARE MEDICINE   Name: Hunter Carter MRN: [id number] DOB: [birth year]    ADMISSION DATE:  07/28/2015 CONSULTATION DATE: 10/14   REFERRING MD :  Claudine Mouton EDP  CHIEF COMPLAINT:  Hematemesis  INITIAL PRESENTATION: 64 year old male presented to ED 10/13 with c/o hematemesis and dark stools x 1 day. Hgb was ok initially, but patient had continued bloody emesis. He was transfused and bolused IVF. He was intubated for airway protection as he became lethargic. GI to scope PCCM to see.  PMH - cirrhosis, CAD s/p CABGx3, CHF, HTN, and HLD  STUDIES:  10/14 EGD  - Mild gastritis throughout Multiple small antral ulcers 1 with flat clot which washed off   SIGNIFICANT EVENTS: 10/14 - Admit 10/15 agitation ? Withdrawal - precedex gtt  SUBJECTIVE:  Awake, follows commands. Extubated yesterday, tolerating well. Asking for juice.   VITAL SIGNS: Temp:  [98.2 F (36.8 C)-99.3 F (37.4 C)] 98.2 F (36.8 C) (10/18 0751) Pulse Rate:  [44-90] 71 (10/18 0600) Resp:  [0-27] 16 (10/18 0600) BP: (99-134)/(52-72) 122/65 mmHg (10/18 0600) SpO2:  [94 %-100 %] 99 % (10/18 0600) FiO2 (%):  [30 %] 30 % (10/17 1000) Weight:  [225 lb 8.5 oz (102.3 kg)] 225 lb 8.5 oz (102.3 kg) (10/18 0443) HEMODYNAMICS:   VENTILATOR SETTINGS: Vent Mode:  [-] PSV;CPAP FiO2 (%):  [30 %] 30 % PEEP:  [5 cmH20] 5 cmH20 Pressure Support:  [5 cmH20] 5 cmH20 INTAKE / OUTPUT:  Intake/Output Summary (Last 24 hours) at 08/02/15 0807 Last data filed at 08/02/15 0800  Gross per 24 hour  Intake   2017 ml  Output   1305 ml  Net    712 ml    PHYSICAL EXAMINATION: General:chronically ill appearing, NAD Neuro: Awake, alert, conversational. HEENT:  NCAT, sclera anicteric Cardiovascular:  RRR, normal s1s2 Lungs:  Intubated, no wheezes or ronchi noted Abdomen:  S, ND. +BS Musculoskeletal:  No edema or clubbing noted Skin:  Dry, intact  LABS:  CBC  Recent Labs Lab 07/31/15 0225 08/01/15 0252  08/02/15 0220  WBC 8.7 9.0 8.8  HGB 10.8* 10.4* 10.5*  HCT 32.3* 32.2* 32.8*  PLT 32* 32* 38*   Coag's  Recent Labs Lab 07/29/15 0147 07/29/15 1439 07/30/15 0243  APTT 36  --   --   INR 2.28* 1.90* 1.80*   BMET  Recent Labs Lab 07/31/15 0225 08/01/15 0252 08/02/15 0220  NA 144 145 147*  K 4.1 3.9 3.8  CL 112* 112* 113*  CO2 27 29 28   BUN 24* 20 15  CREATININE 0.98 0.86 0.77  GLUCOSE 115* 112* 96   Electrolytes  Recent Labs Lab 07/31/15 0225 08/01/15 0252 08/02/15 0220  CALCIUM 7.8* 8.0* 8.2*  MG 1.9 1.8 1.9  PHOS 2.5 2.0* 3.3   Sepsis Markers  Recent Labs Lab 07/29/15 0930 07/29/15 1303 08/02/15 0220  LATICACIDVEN 5.1* 4.8* 1.1   ABG  Recent Labs Lab 07/29/15 0400  PHART 7.372  PCO2ART 48.5*  PO2ART 479.0*   Liver Enzymes  Recent Labs Lab 07/29/15 0021 07/29/15 0051  AST 47* 41  ALT 26 25  ALKPHOS 101 96  BILITOT 2.5* 2.8*  ALBUMIN 2.1* 2.0*   Cardiac Enzymes  Recent Labs Lab 07/29/15 0713 07/29/15 0930 07/29/15 1303  TROPONINI 0.05* 0.05* 0.07*   Glucose  Recent Labs Lab 07/31/15 0002 07/31/15 0357 07/31/15 0757 08/01/15 0009 08/01/15 1527 08/01/15 1924  GLUCAP 110* 104* 106* 102* 99 77    Imaging Dg Chest Port 1  View  08/02/2015  CLINICAL DATA:  Respiratory failure . EXAM: PORTABLE CHEST 1 VIEW COMPARISON:  None. FINDINGS: Cardiac pacer noted in stable position. Prior CABG. Cardiomegaly. Bibasilar subsegmental atelectasis and/or infiltrates with small pleural effusions. No pneumothorax. IMPRESSION: 1. Cardiac pacer in stable position.  Prior CABG.  Cardiomegaly. 2. Bibasilar atelectasis and/or infiltrates with small bilateral effusions. A component of congestive heart failure cannot be excluded . Electronically Signed   By: Marcello Moores  Register   On: 08/02/2015 07:19     ASSESSMENT / PLAN:  PULMONARY OETT 10/14 >>>10/17 A: Acute Respiratory Failure P:   Supplemental oxygen as  needed  CARDIOVASCULAR A: Hypotension secondary to acute blood loss-resolved H/o HTN, CAD s/p CABGx3, HLD CHF Lactic acidosis - slow to resolve, ? Poor hepatic clearance SVT with stimulation P:  CV monitoring KVO fluids  RENAL A:  Hypomag P:   Replete lytes as needed Trend BMP  GASTROINTESTINAL A:   Brisk Upper GI bleed -EGD >> Mild gastritis throughout multiple small antral ulcers 1 with flat clot which washed off  Cirrhosis P:   GI consulted  Protonix bID Continue lactulose   HEMATOLOGIC A: Acute Blood Loss Anemia Coagulopathy - likely secondary to liver disease s/p  vitamin K and 1U FFP Thrombocytopenia P:  H/H daily  INFECTIOUS A:  Fever ? etio P:   Blood cx 10/16 >>> Hold off abx  ENDOCRINE A:  Hyperglycemia P:   Monitor on BMP  NEUROLOGIC A:   Acute encephalopathy - likely hepatic encephalopathy , ammonia decreasing - improving History of alcohol abuse-concern for ETOH withdrawal P:   Lactulose and rifaximin  Monitor for signs of withdrawal Thiamine and Folate  FAMILY  - Updates: sister updated 07/31/2015  Algis Greenhouse. Jerline Pain, [place] Resident PGY-2 08/02/2015 8:07 AM

## 2015-08-02 NOTE — Care Management Important Message (Signed)
Important Message  Patient Details  Name: Hunter Carter MRN: [id number] Date of Birth: 10/04/1951   Medicare Important Message Given:  Yes-second notification given    Nathen May 08/02/2015, 12:52 PM

## 2015-08-03 DIAGNOSIS — K254 Chronic or unspecified gastric ulcer with hemorrhage: Principal | ICD-10-CM

## 2015-08-03 LAB — PHOSPHORUS: PHOSPHORUS: 3.2 mg/dL (ref 2.5–4.6)

## 2015-08-03 LAB — GLUCOSE, CAPILLARY
GLUCOSE-CAPILLARY: 85 mg/dL (ref 65–99)
Glucose-Capillary: 87 mg/dL (ref 65–99)
Glucose-Capillary: 94 mg/dL (ref 65–99)
Glucose-Capillary: 106 mg/dL — ABNORMAL HIGH (ref 65–99)
Glucose-Capillary: 91 mg/dL (ref 65–99)
Glucose-Capillary: 92 mg/dL (ref 65–99)

## 2015-08-03 LAB — CBC
HCT: 33.4 % — ABNORMAL LOW (ref 39.0–52.0)
HEMOGLOBIN: 11 g/dL — AB (ref 13.0–17.0)
MCH: 33.5 pg (ref 26.0–34.0)
MCHC: 32.9 g/dL (ref 30.0–36.0)
MCV: 101.8 fL — ABNORMAL HIGH (ref 78.0–100.0)
Platelets: 39 10*3/uL — ABNORMAL LOW (ref 150–400)
RBC: 3.28 MIL/uL — AB (ref 4.22–5.81)
RDW: 15 % (ref 11.5–15.5)
WBC: 6.7 10*3/uL (ref 4.0–10.5)

## 2015-08-03 LAB — MAGNESIUM: MAGNESIUM: 1.6 mg/dL — AB (ref 1.7–2.4)

## 2015-08-03 LAB — BASIC METABOLIC PANEL
ANION GAP: 6 (ref 5–15)
BUN: 11 mg/dL (ref 6–20)
CHLORIDE: 105 mmol/L (ref 101–111)
CO2: 28 mmol/L (ref 22–32)
CREATININE: 0.73 mg/dL (ref 0.61–1.24)
Calcium: 7.8 mg/dL — ABNORMAL LOW (ref 8.9–10.3)
GFR calc non Af Amer: >60 mL/min (ref >60)
Glucose, Bld: 101 mg/dL — ABNORMAL HIGH (ref 65–99)
Potassium: 3.4 mmol/L — ABNORMAL LOW (ref 3.5–5.1)
SODIUM: 139 mmol/L (ref 135–145)

## 2015-08-03 MED ORDER — FUROSEMIDE 40 MG PO TABS
40.0000 mg | ORAL_TABLET | Freq: Once | ORAL | Status: AC
  Administered 2015-08-03: 40 mg via ORAL
  Filled 2015-08-03: qty 1

## 2015-08-03 MED ORDER — VITAMIN B-1 100 MG PO TABS
100.0000 mg | ORAL_TABLET | Freq: Every day | ORAL | Status: DC
  Administered 2015-08-04: 100 mg via ORAL
  Filled 2015-08-03: qty 1

## 2015-08-03 MED ORDER — PANTOPRAZOLE SODIUM 40 MG PO TBEC
40.0000 mg | DELAYED_RELEASE_TABLET | Freq: Two times a day (BID) | ORAL | Status: DC
  Administered 2015-08-03 – 2015-08-04 (×2): 40 mg via ORAL
  Filled 2015-08-03 (×2): qty 1

## 2015-08-03 MED ORDER — MAGNESIUM SULFATE 2 GM/50ML IV SOLN
2.0000 g | Freq: Once | INTRAVENOUS | Status: AC
  Administered 2015-08-03: 2 g via INTRAVENOUS
  Filled 2015-08-03: qty 50

## 2015-08-03 MED ORDER — POTASSIUM CHLORIDE CRYS ER 20 MEQ PO TBCR
40.0000 meq | EXTENDED_RELEASE_TABLET | Freq: Once | ORAL | Status: AC
  Administered 2015-08-03: 40 meq via ORAL
  Filled 2015-08-03: qty 2

## 2015-08-03 MED ORDER — PRO-STAT SUGAR FREE PO LIQD
30.0000 mL | Freq: Three times a day (TID) | ORAL | Status: DC
  Administered 2015-08-03 – 2015-08-04 (×3): 30 mL via ORAL
  Filled 2015-08-03 (×3): qty 30

## 2015-08-03 MED ORDER — FOLIC ACID 1 MG PO TABS
1.0000 mg | ORAL_TABLET | Freq: Every day | ORAL | Status: DC
  Administered 2015-08-04: 1 mg via ORAL
  Filled 2015-08-03: qty 1

## 2015-08-03 MED FILL — Medication: Qty: 1 | Status: AC

## 2015-08-03 NOTE — Clinical Social Work Placement (Signed)
   CLINICAL SOCIAL WORK PLACEMENT  NOTE  Date:  08/03/2015  Patient Details  Name: Lorin Gawron MRN: [id number] Date of Birth: [birth year]  Clinical Social Work is seeking post-discharge placement for this patient at the [place] level of care (*CSW will initial, date and re-position this form in  chart as items are completed):  Yes   Patient/family provided with [facility] Work Department's list of facilities offering this level of care within the geographic area requested by the patient (or if unable, by the patient's family).  Yes   Patient/family informed of their freedom to choose among providers that offer the needed level of care, that participate in Medicare, Medicaid or managed care program needed by the patient, have an available bed and are willing to accept the patient.  Yes   Patient/family informed of [facility]'s ownership interest in [facility] and [facility], as well as of the fact that they are under no obligation to receive care at these facilities.  PASRR submitted to EDS on 08/03/15     PASRR number received on 08/03/15     Existing PASRR number confirmed on       FL2 transmitted to all facilities in geographic area requested by pt/family on 08/03/15     FL2 transmitted to all facilities within larger geographic area on       Patient informed that his/her managed care company has contracts with or will negotiate with certain facilities, including the following:        Yes   Patient/family informed of bed offers received.  Patient chooses bed at [facility]     Physician recommends and patient chooses bed at      Patient to be transferred to   on  .  Patient to be transferred to facility by       Patient family notified on   of transfer.  Name of family member notified:        PHYSICIAN Please sign FL2     Additional Comment:    _______________________________________________ Ross Ludwig,  LCSWA 08/03/2015, 10:13 PM

## 2015-08-03 NOTE — Clinical Social Work Note (Signed)
Clinical Social Work Assessment  Patient Details  Name: Hunter Carter MRN: [id number] Date of Birth: 1950/12/09  Date of referral:  08/03/15               Reason for consult:  Facility Placement                Permission sought to share information with:  Family Supports Permission granted to share information::  Yes, Verbal Permission Granted  Name::     Foy Vanduyne patient's daughter  Agency::  SNF admission   Relationship::     Contact Information:     Housing/Transportation Living arrangements for the past 2 months:  [place] of Information:  Patient, Adult Children Patient Interpreter Needed:  None Criminal Activity/Legal Involvement Pertinent to Current Situation/Hospitalization:  No - Comment as needed Significant Relationships:  Adult Children Lives with:  Adult Children Do you feel safe going back to the place where you live?  Yes (Patient feels like he needs some short term rehab before she is able to return back home.) Need for family participation in patient care:  Yes (Comment) (Patient requested his daughter to help make decisions for patient)  Care giving concerns: Patient and family feel patient needs some short term rehab in order to increase his strength.   Social Worker assessment / plan:  Patient is a 64 year old male who lives with his daughter.  Patient is alert and oriented x4 and was able to converse but was tired.  Patient requested to have his daughter help make decisions for him with deciding on a SNF.  CSW explained process of SNF placement to patient and his daughter they did not express any more questions.  Patient expressed that he has never been in a short term rehab, but was explained what to expect.  Patient expressed that he did not have any other questions, and is in agreement to going to SNF.  Employment status:  Retired Nurse, adult PT Recommendations:  [place]  / Referral to community resources:  [place]  Patient/Family's Response to care:  Patient and family agreeable to going to SNF for short term rehab.  Patient/Family's Understanding of and Emotional Response to Diagnosis, Current Treatment, and Prognosis:  Patient and family are aware of patient's diagnosis and current treatment plan.  Emotional Assessment Appearance:  Appears stated age Attitude/Demeanor/Rapport:    Affect (typically observed):  Calm, Stable, Appropriate, Pleasant Orientation:  Oriented to Self, Oriented to Place, Oriented to  Time, Oriented to Situation Alcohol / Substance use:  Not Applicable Psych involvement (Current and /or in the community):  No (Comment)  Discharge Needs  Concerns to be addressed:  No discharge needs identified Readmission within the last 30 days:  No Current discharge risk:  None Barriers to Discharge:  No Barriers Identified   Ross Ludwig, LCSWA 08/03/2015, 10:04 PM

## 2015-08-03 NOTE — Progress Notes (Signed)
PROGRESS NOTE    Hunter Carter ZOX:[id number] DOB: Sep 08, 1951 DOA: 07/28/2015 PCP: Elizabeth Palau, MD  HPI/Brief narrative 65 year old male with history of cirrhosis, CAD status post CABG 3, CHF, HTN, HLD presented to ED 10/13 with complaints of hematemesis and dark stools 1 day. Hemoglobin initially okay but patient continued bloody emesis. He was transfused and bolused with IV fluids. He was intubated for airway protection as he became lethargic. After patient stabilized, his care was transferred to medical floor and [place] service on 08/03/15   Assessment/Plan:  Acute respiratory failure with hypoxia - Attributed to GI bleed - Managed by CCM in ICU. Status post successful extubation 08/01/15 -Supplemental oxygen as needed - Chest x-ray 10/18 suggests CHF. Trial of Lasix.  Acute upper GI bleed secondary to gastric ulcer - GI bleed seems to have resolved. Hemoglobin stable. Continue twice a day PPI. - EGD report as below.  Acute blood loss anemia - Status post transfusions. Hemoglobin stable.  Coagulopathy secondary to liver disease - Status post vitamin K and 1 unit of FFP  Hypotension - Secondary to acute blood loss. Resolved  Essential hypertension - Controlled  Hyperlipidemia  Lactic acidosis - Slow to resolve-? Poor hepatic clearance  Hypomagnesemia - Replace and follow as needed   Chronic thrombocytopenia - Baseline platelet count between 40-50 in 2014 - Currently in the 30 K arranged and unchanged. Presumed secondary to cirrhosis.  Fever - Unclear etiology - Defervesced. Blood cultures 2: Negative to date. No antibiotics started  Cirrhosis with portal hypertension - By CT abdomen April 2016  Acute encephalopathy - Secondary to hepatic encephalopathy, ICU delirium and alcohol withdrawal - Continue lactulose, Xifaxan 21 day started 08/01/15 - Improving.Ammonia decreasing.  Alcohol abuse - No overt signs of withdrawal. Continue Alcohol  protocol  Dysphagia - As per speech therapy evaluation, continue dysphagia 2 diet and thin liquids.  CAD status post CABG/permanent pacemaker - Asymptomatic of chest pain minimally elevated troponins may be secondary to demand ischemia   DVT prophylaxis: SCDs  Code Status: Full  Family Communication: Discussed with niece at bedside on 10/19  Disposition Plan: DC SNF possibly 10/20  Consultants:  [facility]-signed off   CCM-signed off   Rehabilitation M.D.  Procedures:  Status post extubation 10/17   Foley catheter-DC'd 10/19-check for voiding  EGD 07/29/15: ENDOSCOPIC IMPRESSION: 1. No esophageal varices 2. Mild gastritis throughout 3. Multiple small antral ulcers 1 with flat clot which washed off at the end of the [email or web address] bulbitis 5. Otherwise within normal limits EGD without signs of active bleeding  RECOMMENDATIONS: care per ICU team okay to place feeding tube for meds and lactulose consider H. pylori antibodies and if positive treat at some point in the future and continue Protonix and okay to stop octreotide and will need to evaluate aspirin needs before discharge or if he needs any blood thinners at all particularly with his low platelet count and increased INR and consider follow-up with [facility] who did his colonoscopy last year   Antibiotics:  IV Rocephin 1 dose 10/13  Rifaximin 10/17 >   Subjective: Denies complaints.   Objective: Filed Vitals:   08/03/15 0500 08/03/15 0642 08/03/15 1003 08/03/15 1421  BP:  130/72  134/59  Pulse:  69  70  Temp:  98.4 F (36.9 C)  98.6 F (37 C)  TempSrc:    Oral  Resp:  16    Height:   5\' 6"  (1.676 m)   Weight: 103.193 kg (227 lb 8 oz)  103.193  kg (227 lb 8 oz)   SpO2:  98%  99%    Intake/Output Summary (Last 24 hours) at 08/03/15 1526 Last data filed at 08/03/15 0900  Gross per 24 hour  Intake    930 ml  Output      0 ml  Net    930 ml   Filed Weights   08/02/15 2052 08/03/15 0500  08/03/15 1003  Weight: 103.193 kg (227 lb 8 oz) 103.193 kg (227 lb 8 oz) 103.193 kg (227 lb 8 oz)     Exam:  General exam: Pleasant middle-aged male lying comfortably propped up in bed.  Respiratory system: Reduced breath sounds in the bases but otherwise clear to auscultation. No increased work of breathing. Cardiovascular system: S1 & S2 heard, RRR. No JVD, murmurs, gallops, clicks or pedal edema.Telemetry: Sinus rhythm with BBB morphology and occasional PVCs.  Gastrointestinal system: Abdomen is nondistended, soft and nontender. Normal bowel sounds heard. Central nervous system: Alert and oriented 2. No focal neurological deficits. Extremities: Symmetric 5 x 5 power.   Data Reviewed: Basic Metabolic Panel:  Recent Labs Lab 07/29/15 0713 07/30/15 0243 07/31/15 0225 08/01/15 0252 08/02/15 0220 08/03/15 0535  NA 136 136 144 145 147* 139  K 4.2 3.8 4.1 3.9 3.8 3.4*  CL 104 106 112* 112* 113* 105  CO2 21* 23 27 29 28 28   GLUCOSE 154* 106* 115* 112* 96 101*  BUN 19 22* 24* 20 15 11   CREATININE 1.15 1.01 0.98 0.86 0.77 0.73  CALCIUM 7.3* 7.5* 7.8* 8.0* 8.2* 7.8*  MG 1.3*  --  1.9 1.8 1.9 1.6*  PHOS 3.6  --  2.5 2.0* 3.3 3.2   Liver Function Tests:  Recent Labs Lab 07/29/15 0021 07/29/15 0051  AST 47* 41  ALT 26 25  ALKPHOS 101 96  BILITOT 2.5* 2.8*  PROT 6.3* 5.9*  ALBUMIN 2.1* 2.0*    Recent Labs Lab 07/29/15 0021  LIPASE 22    Recent Labs Lab 07/29/15 0300 07/30/15 0243 07/31/15 0225  AMMONIA 376* 127* 93*   CBC:  Recent Labs Lab 07/29/15 0051  07/30/15 0243 07/31/15 0225 08/01/15 0252 08/02/15 0220 08/03/15 0535  WBC 5.7  < > 8.7 8.7 9.0 8.8 6.7  NEUTROABS 3.4  --   --   --   --   --   --   HGB 12.8*  < > 11.4* 10.8* 10.4* 10.5* 11.0*  HCT 36.4*  < > 32.8* 32.3* 32.2* 32.8* 33.4*  MCV 96.6  < > 96.2 100.3* 101.9* 101.5* 101.8*  PLT PLATELET CLUMPS NOTED ON SMEAR, UNABLE TO ESTIMATE  < > 50* 32* 32* 38* 39*  < > = values in this interval  not displayed. Cardiac Enzymes:  Recent Labs Lab 07/29/15 0713 07/29/15 0930 07/29/15 1303  TROPONINI 0.05* 0.05* 0.07*   BNP (last 3 results) No results for input(s): PROBNP in the last 8760 hours. CBG:  Recent Labs Lab 08/02/15 1931 08/03/15 0011 08/03/15 0352 08/03/15 0740 08/03/15 1134  GLUCAP 110* 85 87 94 106*    Recent Results (from the past 240 hour(s))  MRSA PCR Screening     Status: None   Collection Time: 07/29/15  5:40 AM  Result Value Ref Range Status   MRSA by PCR NEGATIVE NEGATIVE Final    Comment:        The GeneXpert MRSA Assay (FDA approved for NASAL specimens only), is one component of a comprehensive MRSA colonization surveillance program. It is not intended to diagnose MRSA  infection nor to guide or monitor treatment for MRSA infections.   Culture, respiratory (NON-Expectorated)     Status: None   Collection Time: 07/31/15  8:30 AM  Result Value Ref Range Status   Specimen Description TRACHEAL ASPIRATE  Final   Special Requests Normal  Final   Gram Stain   Final    RARE WBC PRESENT, PREDOMINANTLY PMN RARE SQUAMOUS EPITHELIAL CELLS PRESENT NO ORGANISMS SEEN Performed at [facility]    Culture   Final    Non-Pathogenic Oropharyngeal-type [place]ora Isolated. Performed at [facility]    Report Status 08/02/2015 FINAL  Final  Culture, blood (routine x 2)     Status: None (Preliminary result)   Collection Time: 07/31/15 11:10 AM  Result Value Ref Range Status   Specimen Description BLOOD BLOOD LEFT HAND  Final   Special Requests BOTTLES DRAWN AEROBIC AND ANAEROBIC 5CC  Final   Culture NO GROWTH 3 DAYS  Final   Report Status PENDING  Incomplete  Culture, blood (routine x 2)     Status: None (Preliminary result)   Collection Time: 07/31/15 11:25 AM  Result Value Ref Range Status   Specimen Description BLOOD BLOOD LEFT HAND  Final   Special Requests BOTTLES DRAWN AEROBIC AND ANAEROBIC 5CC  Final   Culture NO GROWTH 3 DAYS   Final   Report Status PENDING  Incomplete        Studies: Dg Chest Port 1 View  08/02/2015  CLINICAL DATA:  Respiratory failure . EXAM: PORTABLE CHEST 1 VIEW COMPARISON:  None. FINDINGS: Cardiac pacer noted in stable position. Prior CABG. Cardiomegaly. Bibasilar subsegmental atelectasis and/or infiltrates with small pleural effusions. No pneumothorax. IMPRESSION: 1. Cardiac pacer in stable position.  Prior CABG.  Cardiomegaly. 2. Bibasilar atelectasis and/or infiltrates with small bilateral effusions. A component of congestive heart failure cannot be excluded . Electronically Signed   By: [place]   On: 08/02/2015 07:19        Scheduled Meds: . sodium chloride   Intravenous Once  . antiseptic oral rinse  7 mL Mouth Rinse QID  . chlorhexidine gluconate  15 mL Mouth Rinse BID  . erythromycin   Left Eye 4 times per day  . feeding supplement (PRO-STAT SUGAR FREE 64)  30 mL Per Tube TID  . folic acid  1 mg Intravenous Daily  . lactulose  20 g Oral TID  . pantoprazole (PROTONIX) IV  40 mg Intravenous Q12H  . rifaximin  550 mg Oral Q12H  . thiamine IV  100 mg Intravenous Daily   Continuous Infusions:   Active Problems:   GI bleed   Acute GI bleeding   Acute respiratory failure (HCC)   Bleeding gastrointestinal   Hemorrhagic shock   Encephalopathy acute    Time spent: 35 minutes.    Vernell Leep, MD, FACP, FHM. [facility] Pager [phone]  If 7PM-7AM, please contact night-coverage [email or web address] Password [email or web address] 08/03/2015, 3:26 PM    LOS: 5 days

## 2015-08-03 NOTE — Clinical Social Work Note (Addendum)
CSW received consult for patient to go to SNF for short term rehab.  CSW met with patient and explained SNF placement process, and how insurance pays for coverage.  Patient expressed that he is open to going to SNF, CSW contacted patient's daughter [name] to explain SNF process and placement, and she agreed to start SNF placement process for short term rehab.  CSW faxed out patient's information to facilities.  CSW to continue to follow patient's progress, formal assessment to follow.  2:30pm CSW presented bed offers to patient and his daughter, and they have agreed to [facility]  CSW contacted [facility] and confirmed they can take patient once he is medically ready for discharge and orders have been received.  CSW to continue to follow patient's progress.  Jones Broom. Heath, MSW, [place] 08/03/2015 12:04 PM

## 2015-08-03 NOTE — Care Management Note (Signed)
Case Management Note  Patient Details  Name: Hunter Carter MRN: [id number] Date of Birth: [birth year]  Subjective/Objective:      Patient is transfer from other unit.  Patient has  Agreed to snf  Per csw note,  CSW faxing out.  NCM will cont to follow.              Action/Plan:   Expected Discharge Date:                  Expected Discharge Plan:  Skilled Nursing Facility  In-House Referral:  Clinical Social Work  Discharge planning Services  CM Consult  Post Acute Care Choice:    Choice offered to:     DME Arranged:    DME Agency:     HH Arranged:    [place] Agency:     Status of Service:  Completed, signed off  Medicare Important Message Given:  Yes-second notification given Date Medicare IM Given:    Medicare IM give by:    Date Additional Medicare IM Given:    Additional Medicare Important Message give by:     If discussed at [place] of Stay Meetings, dates discussed:    Additional Comments:  Zenon Mayo, RN 08/03/2015, 12:14 PM

## 2015-08-03 NOTE — Progress Notes (Signed)
Speech Language Pathology Treatment: Dysphagia  Patient Details Name: Hunter Carter MRN: [id number] DOB: 03/03/1951 Today's Date: 08/03/2015 Time: 0923-3007 SLP Time Calculation (min) (ACUTE ONLY): 36 min  Assessment / Plan / Recommendation Clinical Impression  Pt sleepy this am but awoke with gentle verbal/tactile stimulation.  Pt reports throat feeling better and endorses swallow ability to be near normal initially.  He does admit to occasional coughing with intake yesterday - with both food and drink.  Chronic throat clearing with intake even prior to admit reported by pt.   Pt with edema in hands and has limited ability to self feed.   Pt accepted intake of crackers, Ensure, apple sauce and apple juice.  Delayed cough x1 with expectoration of secretions appearing mixed with Ensure.   Pt reports issues with secretions - has recent h/o ceasing smoking.  Slow mastication and protective exhalation after swallow.  Pt is afebrile, with productive cough and CXR 10/18 B ATX or infiltrates, ? CHF.    Due to pt's ongoing weakness and his admission of decline in swallow ability, recommend continue Dys2/thin with strict precautions.  Using teach back, educated pt to findings/recommendations.    As swallow ability is expected to continue to get better s/p extubation, will follow clinically for dietary advancement.     HPI Other Pertinent Information: 64 year old male admitted 07/28/15 with hematemesis and dark stool. PMH significant ofr cirrhosis, UGI bleed, CAD s/p CABG. ETT 10/14-17/16.   Pertinent Vitals Pain Assessment: No/denies pain  SLP Plan  Continue with current plan of care    Recommendations Diet recommendations: Dysphagia 2 (fine chop);Thin liquid Liquids provided via: Cup;Straw Medication Administration: Whole meds with puree Supervision: Patient able to self feed Compensations: Slow rate;Small sips/bites (start meal with liquids) Postural Changes and/or Swallow Maneuvers: Seated  upright 90 degrees;Upright 30-60 min after meal              Follow up Recommendations: Inpatient Rehab Plan: Continue with current plan of care    [place], [facility] SLP [phone]

## 2015-08-03 NOTE — Progress Notes (Signed)
Rehab admissions - Evaluated for possible admission.  I met with patient and explained about limited rehab beds and need authorization from [facility] and about his copays to stay in the hospital.  He is open to SNF outside hospital so that he will not have copays.  I have let Education officer, museum and case manager know.  Recommend pursuit of SNF placement.  Call me for questions.  #[phone]

## 2015-08-04 DIAGNOSIS — E876 Hypokalemia: Secondary | ICD-10-CM

## 2015-08-04 LAB — GLUCOSE, CAPILLARY
GLUCOSE-CAPILLARY: 92 mg/dL (ref 65–99)
GLUCOSE-CAPILLARY: 79 mg/dL (ref 65–99)
Glucose-Capillary: 108 mg/dL — ABNORMAL HIGH (ref 65–99)

## 2015-08-04 LAB — COMPREHENSIVE METABOLIC PANEL
ALK PHOS: 88 U/L (ref 38–126)
ALT: 26 U/L (ref 17–63)
ANION GAP: 4 — AB (ref 5–15)
AST: 45 U/L — ABNORMAL HIGH (ref 15–41)
Albumin: 1.7 g/dL — ABNORMAL LOW (ref 3.5–5.0)
BILIRUBIN TOTAL: 1.8 mg/dL — AB (ref 0.3–1.2)
BUN: 9 mg/dL (ref 6–20)
CALCIUM: 7.9 mg/dL — AB (ref 8.9–10.3)
CO2: 27 mmol/L (ref 22–32)
CREATININE: 0.81 mg/dL (ref 0.61–1.24)
Chloride: 110 mmol/L (ref 101–111)
GFR calc non Af Amer: >60 mL/min (ref >60)
GLUCOSE: 94 mg/dL (ref 65–99)
Potassium: 3.4 mmol/L — ABNORMAL LOW (ref 3.5–5.1)
Sodium: 141 mmol/L (ref 135–145)
TOTAL PROTEIN: 6.1 g/dL — AB (ref 6.5–8.1)

## 2015-08-04 LAB — AMMONIA: Ammonia: 61 umol/L — ABNORMAL HIGH (ref 9–35)

## 2015-08-04 LAB — MAGNESIUM: MAGNESIUM: 1.5 mg/dL — AB (ref 1.7–2.4)

## 2015-08-04 MED ORDER — POTASSIUM CHLORIDE CRYS ER 20 MEQ PO TBCR
40.0000 meq | EXTENDED_RELEASE_TABLET | Freq: Once | ORAL | Status: AC
  Administered 2015-08-04: 40 meq via ORAL
  Filled 2015-08-04: qty 2

## 2015-08-04 MED ORDER — ACETAMINOPHEN 325 MG PO TABS: 650.0000 mg | ORAL_TABLET | Freq: Four times a day (QID) | ORAL | Status: DC | PRN

## 2015-08-04 MED ORDER — MAGNESIUM SULFATE 2 GM/50ML IV SOLN
2.0000 g | Freq: Once | INTRAVENOUS | Status: AC
  Administered 2015-08-04: 2 g via INTRAVENOUS
  Filled 2015-08-04: qty 50

## 2015-08-04 MED ORDER — FOLIC ACID 1 MG PO TABS: 1.0000 mg | ORAL_TABLET | Freq: Every day | ORAL | Status: DC

## 2015-08-04 MED ORDER — ENSURE ENLIVE PO LIQD: 237.0000 mL | Freq: Once | ORAL | Status: DC

## 2015-08-04 MED ORDER — LACTULOSE 10 GM/15ML PO SOLN: 20.0000 g | Freq: Three times a day (TID) | ORAL | Status: DC

## 2015-08-04 MED ORDER — THIAMINE HCL 100 MG PO TABS: 100.0000 mg | ORAL_TABLET | Freq: Every day | ORAL | Status: DC

## 2015-08-04 MED ORDER — PANTOPRAZOLE SODIUM 40 MG PO TBEC: 40.0000 mg | DELAYED_RELEASE_TABLET | Freq: Two times a day (BID) | ORAL | Status: DC

## 2015-08-04 MED ORDER — PRO-STAT SUGAR FREE PO LIQD: 30.0000 mL | Freq: Three times a day (TID) | ORAL | Status: DC

## 2015-08-04 MED ORDER — RIFAXIMIN 550 MG PO TABS: 550.0000 mg | ORAL_TABLET | Freq: Two times a day (BID) | ORAL | Status: DC

## 2015-08-04 NOTE — Progress Notes (Signed)
Hunter Carter to be D/C'd Rehab per MD order.  Discussed with the patient and all questions fully answered.  VSS, Skin clean, dry and intact without evidence of skin break down, no evidence of skin tears noted. IV catheter discontinued intact. Site without signs and symptoms of complications. Dressing and pressure applied.  An After Visit Summary was printed and given to the patient. Patient received prescription.  D/c education completed with patient/family including follow up instructions, medication list, d/c activities limitations if indicated, with other d/c instructions as indicated by MD - patient able to verbalize understanding, all questions fully answered.   Patient instructed to return to ED, call 911, or call MD for any changes in condition.   Patient escorted via [place], and D/C home via private auto.  Hunter Carter 08/04/2015 3:38 PM

## 2015-08-04 NOTE — Care Management Note (Signed)
Case Management Note  Patient Details  Name: Jiovany Scheffel MRN: [id number] Date of Birth: 1951/08/24  Subjective/Objective:     Patient is for dc today to snf, CSW following.               Action/Plan:   Expected Discharge Date:                  Expected Discharge Plan:  Skilled Nursing Facility  In-House Referral:  Clinical Social Work  Discharge planning Services  CM Consult  Post Acute Care Choice:    Choice offered to:     DME Arranged:    DME Agency:     HH Arranged:    [place] Agency:     Status of Service:  Completed, signed off  Medicare Important Message Given:  Yes-second notification given Date Medicare IM Given:    Medicare IM give by:    Date Additional Medicare IM Given:    Additional Medicare Important Message give by:     If discussed at [employer] of Stay Meetings, dates discussed:    Additional Comments:  Zenon Mayo, RN 08/04/2015, 2:23 PM

## 2015-08-04 NOTE — Progress Notes (Signed)
Nutrition Follow-up  DOCUMENTATION CODES:   Obesity unspecified  INTERVENTION:  - Order Ensure Enlive once daily (each supplement provides: 350 kcal and 20 gm protein).  - RD student will continue to follow for remainder of hospitalization.    NUTRITION DIAGNOSIS:   Inadequate oral intake related to dysphagia as evidenced by energy intake < 75% for > 7 days.  Ongoing.   GOAL:   Patient will meet greater than or equal to 90% of their needs  Ongoing.   MONITOR:   PO intake, Supplement acceptance, Skin, I & O's, Weight trends, Labs, Vent status, Diet advancement  REASON FOR ASSESSMENT:   Other (Comment) (change in status) Enteral/tube feeding initiation and management  ASSESSMENT:   64 yr old male presented with c/o hematemesis and dark stools x 1 day. Hgb was ok initally, but patient had continued bloody emesis. He was transfused and bolused IVF. He was intubated for airway protection as he became lethargic. Prior medical history includes: CAD, inferior MI, s/p CABG, Heart Block AV, Vent. Tachyarcardia, HTN, hyperlipedemia, coronary artery bypass, CHF, arthritis, cardiac cath, Hep C  Patient visited for follow-up assessment, following diet advancement to DYS III.  Patient asleep during visit, but spoke with family in room to gather historical data. Patient will be discharged today to a SNF for continued care.   Per daughter, patient has been eating bites of the foods he is brought and appetite appears to be improving. Per chart, meal completion ranges from 35-45%. RD student encouraged family to support patient on the importance of eating high protein, nutrient dense foods as he is advanced. Will order supplementation for support during the remainder of his hospitalization.   Patient's family had questions regarding foods appropriate for his gastric ulcer. Patient lives at home with daughter, who is the primary cook in the patient's home. RD student provided family diet  education and handout from the Academy of Nutrition and Dietetics, "Peptic Ulcer Nutrition Therapy".   Labs reviewed: low K, low Ca, low Mg, low Heme, high MCV, high Na, low Cl, high Ammonia, high AST  Medications reviewed.     Diet Order:  DIET DYS 3 Room service appropriate?: Yes with Assist; Fluid consistency:: Thin  Skin:  Reviewed, no issues  Last BM:  10/19  Height:   Ht Readings from Last 1 Encounters:  08/04/15 5\' 6"  (1.676 m)    Weight:   Wt Readings from Last 1 Encounters:  08/04/15 232 lb 11.2 oz (105.552 kg)    Ideal Body Weight:  64.5 kg  BMI:  Body mass index is 37.58 kg/(m^2).  Estimated Nutritional Needs:   Kcal:  1700-1900  Protein:  80-95 grams  Fluid:  1.7-1.9 L  EDUCATION NEEDS:   Education needs addressed  Kayleen Memos, Dietetic Intern 08/04/2015 2:22 PM  I agree with the intern's assessment. Case reviewed and appropriate revisions made.   Madisson Kulaga A. Jimmye Norman, RD, LDN, CDE Pager: [phone] After hours Pager: [phone]

## 2015-08-04 NOTE — Progress Notes (Signed)
Speech Language Pathology Treatment: Dysphagia  Patient Details Name: Hunter Carter MRN: [id number] DOB: 03-23-1951 Today's Date: 08/04/2015 Time: 4098-1191 SLP Time Calculation (min) (ACUTE ONLY): 32 min  Assessment / Plan / Recommendation Clinical Impression  Pt reports good intake yesterday and improved swallow ability but not yet at baseline.  Pt assisted to slide up in bed, voice was strong and clear this am.  Breakfast consumption observed - intermittent throat clearing noted after intake of liquids with better tolerance of soft solids.  Pt reported sensation of sausage lodging in pharynx, cues to swallow applesauce helped sensation of improved clearance.  As most recent CXR negative and pt report of improvement, recommend advance diet to dys3/thin with ongoing precautions.  Using teach back, educated pt to aspiration precautions and dysphagia mitigation strategies.  Pt agreeable to dietary advancement and follow up SLP at next venue of care.     HPI Other Pertinent Information: 64 year old male admitted 07/28/15 with hematemesis and dark stool. PMH significant ofr cirrhosis, UGI bleed, CAD s/p CABG. ETT 10/14-10/16.   Pertinent Vitals Pain Assessment: No/denies pain  SLP Plan  Continue with current plan of care    Recommendations Diet recommendations: Dysphagia 3 (mechanical soft);Thin liquid Liquids provided via: Cup;Straw Medication Administration:  (as tolerated) Supervision: Patient able to self feed (set up assist) Compensations: Slow rate;Small sips/bites (consume applesauce or puree to help clear solids from pharynx prn) Postural Changes and/or Swallow Maneuvers: Seated upright 90 degrees;Upright 30-60 min after meal              Oral Care Recommendations: Oral care BID Follow up Recommendations: Skilled Nursing facility Plan: Continue with current plan of care    [place], [facility] SLP [phone]

## 2015-08-04 NOTE — Progress Notes (Signed)
Called report to [facility].  [facility] is here to pick patient up now and he will be discharged

## 2015-08-04 NOTE — Clinical Social Work Note (Signed)
Clinical Social Worker facilitated patient discharge including contacting patient family (patient's dtr, Sirena via telephone) and facility to confirm patient discharge plans.  Clinical information faxed to facility and family agreeable with plan.  CSW arranged ambulance transport via [facility] to [facility].  RN to call report prior to discharge.  DC packet prepared and on chart for trans port with number for report.   Clinical Social Worker will sign off for now as social work intervention is no longer needed. Please consult [place] again if new need arises.  [place]on Axe, MSW, LCSWA [phone] 08/04/2015 2:51 PM

## 2015-08-04 NOTE — Progress Notes (Signed)
Physical Therapy Treatment Patient Details Name: Hunter Carter MRN: [id number] DOB: 1950/11/04 Today's Date: 08/04/2015    History of Present Illness Patient is a 64 y/o male presents with c/o of hematemesis and dark stools x 1 day. He gradually became more hypotensive with AMS while in the ED and required pressors and then required intubation for airway protection. Admitted with Acute resp failure due to acute encephalopathy and GI bleed. Extubated 10/17. PMH includes cirrhosis, CAD s/p CABGx3, CHF, HTN, and HLD.    PT Comments    [name] requires min +2 assist to power up to standing and for safety and management of RW during ambulation.  HR up to 160bpm during ambulation.  He became incontinent of stool sitting in recliner at end of session and required +3 assist for pericare.     Follow Up Recommendations  SNF;Supervision/Assistance - 24 hour     Equipment Recommendations  Other (comment) (TBD)    Recommendations for Other Services       Precautions / Restrictions Precautions Precautions: Fall Restrictions Weight Bearing Restrictions: No    Mobility  Bed Mobility Overal bed mobility: Needs Assistance Bed Mobility: Supine to Sit     Supine to sit: Min assist;HOB elevated     General bed mobility comments: Min assist to manage Bil LEs and to use bed pad to scoot to EOB.  Pt w/ use of bed rail w/ increased time.  Transfers Overall transfer level: Needs assistance Equipment used: Rolling walker (2 wheeled) Transfers: Sit to/from Stand Sit to Stand: Min assist;From elevated surface;+2 physical assistance         General transfer comment: Min +2 assist to power up to standing and to maintain balance during sit<>stand transfers.    Ambulation/Gait Ambulation/Gait assistance: Min assist;+2 physical assistance;+2 safety/equipment Ambulation Distance (Feet): 10 Feet Assistive device: Rolling walker (2 wheeled) Gait Pattern/deviations: Step-through  pattern;Decreased stride length;Antalgic;Trunk flexed Gait velocity: decreased Gait velocity interpretation: <1.8 ft/sec, indicative of risk for recurrent falls General Gait Details: Cues to stand upright, pt w/ flexed posture.  Min assist managing RW.  HR up to 160 bpm during ambulation that quickly returned to 115bpm sitting in recliner.   Stairs            Wheelchair Mobility    Modified Rankin (Stroke Patients Only)       Balance Overall balance assessment: Needs assistance Sitting-balance support: Bilateral upper extremity supported;Feet supported Sitting balance-Leahy Scale: Fair     Standing balance support: Bilateral upper extremity supported;During functional activity Standing balance-Leahy Scale: Poor Standing balance comment: Relies on RW and assist for support                    Cognition Arousal/Alertness: Awake/alert Behavior During Therapy: WFL for tasks assessed/performed Overall Cognitive Status: Impaired/Different from baseline Area of Impairment: Problem solving;Safety/judgement         Safety/Judgement: Decreased awareness of safety;Decreased awareness of deficits   Problem Solving: Slow processing;Decreased initiation;Requires verbal cues;Requires tactile cues;Difficulty sequencing General Comments: Pt requires multimodal cues for technique during transfers.    Exercises General Exercises - Lower Extremity Ankle Circles/Pumps: AROM;Both;10 reps;Supine Gluteal Sets: Strengthening;Both;10 reps;Supine Heel Slides: AROM;Both;5 reps;Supine Straight Leg Raises: AROM;Both;5 reps;Supine    General Comments General comments (skin integrity, edema, etc.): After pt sitting in recliner chair at end of session pt became incontinent of stool and requires +3 assist for pericare standing in front of chair.  Transportation to SNF arrives and in room at end of session.  Pertinent Vitals/Pain Pain Assessment: Faces Faces Pain Scale: Hurts a  little bit Pain Location: Bil LEs and back Pain Descriptors / Indicators: Tightness;Sore Pain Intervention(s): Limited activity within patient's tolerance;Monitored during session;Repositioned    Home Living                      Prior Function            PT Goals (current goals can now be found in the care plan section) Acute Rehab PT Goals Patient Stated Goal: to be able to walk PT Goal Formulation: With patient Time For Goal Achievement: 08/16/15 Potential to Achieve Goals: Fair Progress towards PT goals: Progressing toward goals    Frequency  Min 2X/week    PT Plan Discharge plan needs to be updated;Frequency needs to be updated    Co-evaluation             End of Session Equipment Utilized During Treatment: Oxygen;Gait belt Activity Tolerance: Patient limited by fatigue Patient left: in bed;with call bell/phone within reach;with bed alarm set;with nursing/sitter in room     Time: 1456-1545 (20 minutes spent on cleaning room/equipment and pericare) PT Time Calculation (min) (ACUTE ONLY): 49 min  Charges:  $Gait Training: 8-22 mins $Therapeutic Activity: 8-22 mins                    G Codes:      Hunter Carter PT, [place] [phone] Pager: [phone] 08/04/2015, 5:06 PM

## 2015-08-04 NOTE — Discharge Summary (Addendum)
Physician Discharge Summary  Hunter Carter ZOX:[id number] DOB: Mar 19, 1951 DOA: 07/28/2015  PCP: Elizabeth Palau, MD  Admit date: 07/28/2015 Discharge date: 08/04/2015  Time spent: Greater than 30 minutes  Recommendations for Outpatient Follow-up:  1. M.D. at SNF in 3-5 days with repeat labs (CBC, CMP, magnesium & ammonia). Please follow final blood culture results that were sent from hospital.  Discharge Diagnoses:  Active Problems:   GI bleed   Acute GI bleeding   Acute respiratory failure (HCC)   Bleeding gastrointestinal   Hemorrhagic shock   Encephalopathy acute   Discharge Condition: Improved & Stable  Diet recommendation:  Dysphagia 3 diet and thin liquids  Filed Weights   08/03/15 1003 08/04/15 0500 08/04/15 1023  Weight: 103.193 kg (227 lb 8 oz) 105.552 kg (232 lb 11.2 oz) 105.552 kg (232 lb 11.2 oz)    History of present illness:  64 year old male with history of cirrhosis, CAD status post CABG 3, CHF, HTN, HLD presented to ED 10/13 with complaints of hematemesis and dark stools 1 day. Hemoglobin initially okay but patient continued bloody emesis. He was transfused and bolused with IV fluids. He was intubated for airway protection as he became lethargic. After patient stabilized, his care was transferred to medical floor and [facility] service on 08/03/15  Hospital Course:   Acute respiratory failure with hypoxia - Attributed to GI bleed - Managed by CCM in ICU. Status post successful extubation 08/01/15 -Supplemental oxygen as needed - Chest x-ray 10/18 suggests CHF. Received a dose of Lasix 40 mg oral 1 on 10/19. Seems to have improved/resolved. Oxygen saturation at 96% on room air at rest this morning.  Acute upper GI bleed secondary to gastric ulcer - GI bleed seems to have resolved. Hemoglobin stable. Continue twice a day PPI. - EGD report as below. - May consider repeating EGD in a couple of months to ensure healing.  Acute blood loss anemia - Status  post transfusions. Hemoglobin stable.  Coagulopathy secondary to liver disease - Status post vitamin K and 1 unit of FFP  Hypotension - Secondary to acute blood loss. Resolved  Essential hypertension - Controlled off of polypharmacy that patient was on at home. Monitor closely and if blood pressure start to increase, may start antihypertensives one at a time.  Hyperlipidemia - Statins currently on hold secondary to hepatic encephalopathy.  Lactic acidosis - Slow to resolve-? Poor hepatic clearance  Hypomagnesemia/hypokalemia - Replace and follow as needed   Chronic thrombocytopenia - Baseline platelet count between 40-50 in 2014 - Currently in the 30 K arranged and stable. Likely secondary to cirrhosis.  Fever - Unclear etiology - Defervesced. Blood cultures 2: Negative to date. No antibiotics started  Cirrhosis with portal hypertension - By CT abdomen April 2016  Acute encephalopathy - Secondary to hepatic encephalopathy, ICU delirium and alcohol withdrawal - Continue lactulose, Xifaxan 21 day started 08/01/15 - Ammonia decreasing. - Mental status changes seem to have resolved.   Alcohol abuse - No overt signs of withdrawal.   Dysphagia - As per speech therapy evaluation, continue dysphagia 3 diet and thin liquids.  CAD status post CABG/permanent pacemaker - Asymptomatic of chest pain minimally elevated troponins may be secondary to demand ischemia. Aspirin held secondary to severe thrombocytopenia and risk for recurrent GI bleeding. Beta blockers held secondary to concern for hypotension. Statins held secondary to hepatic encephalopathy. These may be resumed as deemed necessary at SNF.    Consultants:  [facility]-signed off   CCM-signed off   Rehabilitation M.D.  Procedures:  Status post extubation 10/17   Foley catheter-DC'd 10/19- voiding normally.   EGD 07/29/15: ENDOSCOPIC IMPRESSION: 1. No esophageal varices 2. Mild gastritis throughout 3.  Multiple small antral ulcers 1 with flat clot which washed off at the end of the [email or web address] bulbitis 5. Otherwise within normal limits EGD without signs of active bleeding  RECOMMENDATIONS: care per ICU team okay to place feeding tube for meds and lactulose consider H. pylori antibodies and if positive treat at some point in the future and continue Protonix and okay to stop octreotide and will need to evaluate aspirin needs before discharge or if he needs any blood thinners at all particularly with his low platelet count and increased INR and consider follow-up with [facility] who did his colonoscopy last year   Antibiotics:  IV Rocephin 1 dose 10/13  Rifaximin 10/17 >   Discharge Exam:  Complaints:  denies complaints.  Filed Vitals:   08/03/15 2153 08/04/15 0500 08/04/15 0513 08/04/15 1023  BP: 134/60  137/78   Pulse: 81  100   Temp: 99.1 F (37.3 C)  98.2 F (36.8 C)   TempSrc: Oral  Oral   Resp: 20  15   Height:    5\' 6"  (1.676 m)  Weight:  105.552 kg (232 lb 11.2 oz)  105.552 kg (232 lb 11.2 oz)  SpO2: 99%  96%     General exam: Pleasant middle-aged male lying comfortably propped up in bed.  Respiratory system: Reduced breath sounds in the bases but otherwise clear to auscultation. No increased work of breathing. Cardiovascular system: S1 & S2 heard, RRR. No JVD, murmurs, gallops, clicks or pedal edema. Gastrointestinal system: Abdomen is nondistended, soft and nontender. Normal bowel sounds heard. Central nervous system: Alert and oriented 3. No focal neurological deficits. Extremities: Symmetric 5 x 5 power.  Discharge Instructions      Discharge Instructions    Call MD for:  difficulty breathing, headache or visual disturbances    Complete by:  As directed      Call MD for:  extreme fatigue    Complete by:  As directed      Call MD for:  persistant dizziness or light-headedness    Complete by:  As directed      Call MD for:  persistant  nausea and vomiting    Complete by:  As directed      Call MD for:  severe uncontrolled pain    Complete by:  As directed      Call MD for:  temperature >100.4    Complete by:  As directed      Call MD for:    Complete by:  As directed   Worsening confusion.     Discharge instructions    Complete by:  As directed   DIET: Dysphagia 3 diet & thin liquids.     Increase activity slowly    Complete by:  As directed             Medication List    STOP taking these medications        amLODipine 5 MG tablet  Commonly known as:  NORVASC     aspirin 81 MG tablet     metoprolol 50 MG tablet  Commonly known as:  LOPRESSOR     simvastatin 20 MG tablet  Commonly known as:  ZOCOR     valsartan-hydrochlorothiazide 320-25 MG tablet  Commonly known as:  DIOVAN-HCT      TAKE these medications  acetaminophen 325 MG tablet  Commonly known as:  TYLENOL  Take 2 tablets (650 mg total) by mouth every 6 (six) hours as needed for mild pain, moderate pain, fever or headache.     erythromycin ophthalmic ointment  Place 1 application into the left eye every 6 (six) hours. Place 1/2 inch ribbon of ointment in the affected eye 4 times a day     feeding supplement (PRO-STAT SUGAR FREE 64) Liqd  Take 30 mLs by mouth 3 (three) times daily.     folic acid 1 MG tablet  Commonly known as:  FOLVITE  Take 1 tablet (1 mg total) by mouth daily.     lactulose 10 GM/15ML solution  Commonly known as:  CHRONULAC  Take 30 mLs (20 g total) by mouth 3 (three) times daily.     pantoprazole 40 MG tablet  Commonly known as:  PROTONIX  Take 1 tablet (40 mg total) by mouth 2 (two) times daily before a meal.     rifaximin 550 MG Tabs tablet  Commonly known as:  XIFAXAN  Take 1 tablet (550 mg total) by mouth every 12 (twelve) hours. For 21 days (started 08/01/15)     thiamine 100 MG tablet  Take 1 tablet (100 mg total) by mouth daily.          The results of significant diagnostics from this  hospitalization (including imaging, microbiology, ancillary and laboratory) are listed below for reference.    Significant Diagnostic Studies: Dg Chest Port 1 View  08/02/2015  CLINICAL DATA:  Respiratory failure . EXAM: PORTABLE CHEST 1 VIEW COMPARISON:  None. FINDINGS: Cardiac pacer noted in stable position. Prior CABG. Cardiomegaly. Bibasilar subsegmental atelectasis and/or infiltrates with small pleural effusions. No pneumothorax. IMPRESSION: 1. Cardiac pacer in stable position.  Prior CABG.  Cardiomegaly. 2. Bibasilar atelectasis and/or infiltrates with small bilateral effusions. A component of congestive heart failure cannot be excluded . Electronically Signed   By: Marcello Moores  Register   On: 08/02/2015 07:19   Dg Chest Port 1 View  08/01/2015  CLINICAL DATA:  Subsequent encounter for acute respiratory failure. EXAM: PORTABLE CHEST 1 VIEW COMPARISON:  Multiple recent previous exams. FINDINGS: 0446 hours. Endotracheal tube tip is approximately 4.7 cm above the base of the carina. The NG tube passes into the stomach although the distal tip position is not included on the film. Left-sided permanent pacemaker remains in place. Lung volumes are low with persistent left base collapse/ consolidation. No evidence for pulmonary edema. Question tiny left pleural effusion. Telemetry leads overlie the chest. IMPRESSION: No substantial change. Opacity at the left base likely secondary to atelectasis although infection not excluded. Electronically Signed   By: Misty Stanley M.D.   On: 08/01/2015 07:03   Dg Chest Port 1 View  07/31/2015  CLINICAL DATA:  Respiratory failure EXAM: PORTABLE CHEST 1 VIEW COMPARISON:  07/30/2015 FINDINGS: Tubular device is stable. Left subclavian pacemaker device and leads stable and intact. Under aerated lungs. Stable opacity at the left base most likely related to volume loss. Right lung is grossly clear. No pneumothorax. IMPRESSION: Stable volume loss at the left base. Electronically  Signed   By: Marybelle Killings M.D.   On: 07/31/2015 08:30   Dg Chest Port 1 View  07/30/2015  CLINICAL DATA:  Acute respiratory failure.  Prior CABG.  CHF. EXAM: PORTABLE CHEST 1 VIEW COMPARISON:  1 day prior FINDINGS: Endotracheal tube terminates 2.7 cm above carina. Nasogastric tube terminates at the body of the stomach. Prior median  sternotomy. Pacer/AICD device. Normal heart size for level of inspiration. Mild right hemidiaphragm elevation. No pleural effusion or pneumothorax. Low lung volumes with resultant pulmonary interstitial prominence. No lobar consolidation. No congestive failure. Mild left base volume loss. IMPRESSION: Diminished lung volumes with mild left base volume loss and probable atelectasis. Electronically Signed   By: Abigail Miyamoto M.D.   On: 07/30/2015 09:29   Dg Chest Portable 1 View  07/29/2015  CLINICAL DATA:  Post intubation EXAM: PORTABLE CHEST 1 VIEW COMPARISON:  03/29/2010 FINDINGS: Postoperative changes in the mediastinum. Cardiac pacemaker. New placement of endotracheal tube with tip measuring 2.1 cm above the carina. Shallow inspiration. Suggestion of mild blunting of the left costophrenic angle possibly indicating a small effusion. No focal airspace disease or consolidation in the lungs. No pneumothorax. Normal heart size and pulmonary vascularity. IMPRESSION: Endotracheal tube placed with tip measuring 2.1 cm above the carina. Shallow inspiration with possible small left pleural effusion. Electronically Signed   By: Lucienne Capers M.D.   On: 07/29/2015 04:02   Dg Abd Portable 1v  07/29/2015  CLINICAL DATA:  OG tube placement EXAM: PORTABLE ABDOMEN - 1 VIEW COMPARISON:  None. FINDINGS: OG tube has been placed with the tip in the mid stomach and the side-port in the proximal stomach. Endotracheal tube is approximately 1.6 cm above the carina. Cardiomegaly. Left lower lobe atelectasis or infiltrate. IMPRESSION: OG tube tip in the mid stomach. Endotracheal tube tip  approximately 1.6 cm above the carina. Electronically Signed   By: Rolm Baptise M.D.   On: 07/29/2015 13:22    Microbiology: Recent Results (from the past 240 hour(s))  MRSA PCR Screening     Status: None   Collection Time: 07/29/15  5:40 AM  Result Value Ref Range Status   MRSA by PCR NEGATIVE NEGATIVE Final    Comment:        The GeneXpert MRSA Assay (FDA approved for NASAL specimens only), is one component of a comprehensive MRSA colonization surveillance program. It is not intended to diagnose MRSA infection nor to guide or monitor treatment for MRSA infections.   Culture, respiratory (NON-Expectorated)     Status: None   Collection Time: 07/31/15  8:30 AM  Result Value Ref Range Status   Specimen Description TRACHEAL ASPIRATE  Final   Special Requests Normal  Final   Gram Stain   Final    RARE WBC PRESENT, PREDOMINANTLY PMN RARE SQUAMOUS EPITHELIAL CELLS PRESENT NO ORGANISMS SEEN Performed at [facility]    Culture   Final    Non-Pathogenic Oropharyngeal-type Flora Isolated. Performed at [facility]    Report Status 08/02/2015 FINAL  Final  Culture, blood (routine x 2)     Status: None (Preliminary result)   Collection Time: 07/31/15 11:10 AM  Result Value Ref Range Status   Specimen Description BLOOD BLOOD LEFT HAND  Final   Special Requests BOTTLES DRAWN AEROBIC AND ANAEROBIC 5CC  Final   Culture NO GROWTH 4 DAYS  Final   Report Status PENDING  Incomplete  Culture, blood (routine x 2)     Status: None (Preliminary result)   Collection Time: 07/31/15 11:25 AM  Result Value Ref Range Status   Specimen Description BLOOD BLOOD LEFT HAND  Final   Special Requests BOTTLES DRAWN AEROBIC AND ANAEROBIC 5CC  Final   Culture NO GROWTH 4 DAYS  Final   Report Status PENDING  Incomplete     Labs: Basic Metabolic Panel:  Recent Labs Lab 07/29/15 [phone]  07/31/15 0225 08/01/15 0252 08/02/15 0220 08/03/15 0535 08/04/15 0512  NA 136  < > 144 145  147* 139 141  K 4.2  < > 4.1 3.9 3.8 3.4* 3.4*  CL 104  < > 112* 112* 113* 105 110  CO2 21*  < > 27 29 28 28 27   GLUCOSE 154*  < > 115* 112* 96 101* 94  BUN 19  < > 24* 20 15 11 9   CREATININE 1.15  < > 0.98 0.86 0.77 0.73 0.81  CALCIUM 7.3*  < > 7.8* 8.0* 8.2* 7.8* 7.9*  MG 1.3*  --  1.9 1.8 1.9 1.6* 1.5*  PHOS 3.6  --  2.5 2.0* 3.3 3.2  --   < > = values in this interval not displayed. Liver Function Tests:  Recent Labs Lab 07/29/15 0021 07/29/15 0051 08/04/15 0512  AST 47* 41 45*  ALT 26 25 26   ALKPHOS 101 96 88  BILITOT 2.5* 2.8* 1.8*  PROT 6.3* 5.9* 6.1*  ALBUMIN 2.1* 2.0* 1.7*    Recent Labs Lab 07/29/15 0021  LIPASE 22    Recent Labs Lab 07/29/15 0300 07/30/15 0243 07/31/15 0225 08/04/15 0512  AMMONIA 376* 127* 93* 61*   CBC:  Recent Labs Lab 07/29/15 0051  07/30/15 0243 07/31/15 0225 08/01/15 0252 08/02/15 0220 08/03/15 0535  WBC 5.7  < > 8.7 8.7 9.0 8.8 6.7  NEUTROABS 3.4  --   --   --   --   --   --   HGB 12.8*  < > 11.4* 10.8* 10.4* 10.5* 11.0*  HCT 36.4*  < > 32.8* 32.3* 32.2* 32.8* 33.4*  MCV 96.6  < > 96.2 100.3* 101.9* 101.5* 101.8*  PLT PLATELET CLUMPS NOTED ON SMEAR, UNABLE TO ESTIMATE  < > 50* 32* 32* 38* 39*  < > = values in this interval not displayed. Cardiac Enzymes:  Recent Labs Lab 07/29/15 0713 07/29/15 0930 07/29/15 1303  TROPONINI 0.05* 0.05* 0.07*   BNP: BNP (last 3 results) No results for input(s): BNP in the last 8760 hours.  ProBNP (last 3 results) No results for input(s): PROBNP in the last 8760 hours.  CBG:  Recent Labs Lab 08/03/15 1654 08/03/15 2043 08/04/15 0357 08/04/15 0810 08/04/15 1200  GLUCAP 91 92 92 79 108*      Signed:  Nastassja Witkop, MD, FACP, FHM. [facility] Pager [phone]  If 7PM-7AM, please contact night-coverage [email or web address] Password [email or web address] 08/04/2015, 1:39 PM

## 2015-08-04 NOTE — Clinical Social Work Placement (Signed)
   CLINICAL SOCIAL WORK PLACEMENT  NOTE  Date:  08/04/2015  Patient Details  Name: Hunter Carter MRN: [id number] Date of Birth: 25-Mar-1951  Clinical Social Work is seeking post-discharge placement for this patient at the [place] level of care (*CSW will initial, date and re-position this form in  chart as items are completed):  Yes   Patient/family provided with [facility] Work Department's list of facilities offering this level of care within the geographic area requested by the patient (or if unable, by the patient's family).  Yes   Patient/family informed of their freedom to choose among providers that offer the needed level of care, that participate in Medicare, Medicaid or managed care program needed by the patient, have an available bed and are willing to accept the patient.  Yes   Patient/family informed of [facility]'s ownership interest in [facility] and [facility], as well as of the fact that they are under no obligation to receive care at these facilities.  PASRR submitted to EDS on 08/03/15     PASRR number received on 08/03/15     Existing PASRR number confirmed on       FL2 transmitted to all facilities in geographic area requested by pt/family on 08/03/15     FL2 transmitted to all facilities within larger geographic area on       Patient informed that his/her managed care company has contracts with or will negotiate with certain facilities, including the following:        Yes   Patient/family informed of bed offers received.  Patient chooses bed at [facility]     Physician recommends and patient chooses bed at      Patient to be transferred to  ([facility]) on 08/04/15.  Patient to be transferred to facility by  Corey Harold )     Patient family notified on 08/04/15 of transfer.  Name of family member notified:   (Pt's dtr, Sirena via telephone )     PHYSICIAN Please sign FL2     Additional Comment:     _______________________________________________ Glendon Axe, MSW, LCSWA [phone] 08/04/2015 2:49 PM

## 2015-08-06 LAB — CULTURE, BLOOD (ROUTINE X 2)
CULTURE: NO GROWTH
CULTURE: NO GROWTH

## 2015-08-08 ENCOUNTER — Ambulatory Visit: Payer: Medicare Other | Admitting: Cardiovascular Disease

## 2015-08-09 ENCOUNTER — Non-Acute Institutional Stay (SKILLED_NURSING_FACILITY): Payer: Medicare Other | Admitting: Internal Medicine

## 2015-08-09 DIAGNOSIS — E876 Hypokalemia: Secondary | ICD-10-CM

## 2015-08-09 DIAGNOSIS — K7460 Unspecified cirrhosis of liver: Secondary | ICD-10-CM

## 2015-08-09 DIAGNOSIS — E46 Unspecified protein-calorie malnutrition: Secondary | ICD-10-CM | POA: Diagnosis not present

## 2015-08-09 DIAGNOSIS — R600 Localized edema: Secondary | ICD-10-CM | POA: Diagnosis not present

## 2015-08-09 DIAGNOSIS — I10 Essential (primary) hypertension: Secondary | ICD-10-CM | POA: Diagnosis not present

## 2015-08-09 DIAGNOSIS — I25119 Atherosclerotic heart disease of native coronary artery with unspecified angina pectoris: Secondary | ICD-10-CM

## 2015-08-09 DIAGNOSIS — R1310 Dysphagia, unspecified: Secondary | ICD-10-CM

## 2015-08-09 DIAGNOSIS — D638 Anemia in other chronic diseases classified elsewhere: Secondary | ICD-10-CM

## 2015-08-09 DIAGNOSIS — K253 Acute gastric ulcer without hemorrhage or perforation: Secondary | ICD-10-CM

## 2015-08-09 DIAGNOSIS — R5381 Other malaise: Secondary | ICD-10-CM | POA: Diagnosis not present

## 2015-08-09 DIAGNOSIS — D696 Thrombocytopenia, unspecified: Secondary | ICD-10-CM

## 2015-08-09 DIAGNOSIS — K254 Chronic or unspecified gastric ulcer with hemorrhage: Secondary | ICD-10-CM | POA: Diagnosis not present

## 2015-08-09 NOTE — Progress Notes (Signed)
Patient ID: Hunter Carter, male   DOB: May 28, 1951, 64 y.o.   MRN: [id number]     Facility: [facility]    PCP: Elizabeth Palau, MD  Code Status: full code  No Known Allergies  Chief Complaint  Patient presents with  . New Admit To SNF     HPI:  64 y.o. patient is here for short term rehabilitation post hospital admission from 07/28/15-08/04/15 with hematemesis and dark colored stool. He was hypotensive and required iv fluids and blood transfusion. He required intubation for airway protection and acute respiratory failure from fluid overload. He required iv diuresis. He underwent EGD showing gastric ulcer and mild gastritis. He received vitamin k and FFP trasfusion due to coagulopathy. He also had acute encephalopathy thought to be from liver cirrhosis, icu delirium and alcohol withdrawal. He is now on lactulose and xifaxan. Reviewed blood culture report showing no bacterial growth.He has PMH of cirrhosis, CAD s/p CABG X3, CHF, HTN, HLD. He is seen in his room today with his son present. He feels that his energy is coming back. Appetite is picking up.   Review of Systems:  Constitutional: Negative for fever, chills, diaphoresis.  HENT: Negative for headache, congestion, nasal discharge Eyes: Negative for eye pain, blurred vision, double vision and discharge.  Respiratory: Negative for cough, shortness of breath at rest and wheezing.  has some dyspnea on exertion Cardiovascular: Negative for chest pain, palpitations. Positive for leg swelling.  Gastrointestinal: Negative for heartburn, nausea, vomiting, abdominal pain. Had bowel movement this am  Genitourinary: Negative for dysuria, flank pain.  Musculoskeletal: Negative for back pain, falls. Skin: Negative for itching, rash.  Neurological: Negative for dizziness, tingling, focal weakness Psychiatric/Behavioral: Negative for depression.   Past Medical History  Diagnosis Date  . CAD (coronary artery  disease)   . Inferior MI ([place]) 2005  . S/P CABG x 3 02/10/2003    LIMA to LAD, SVG to first diagonal,SVG to OM1  . Heart block AV second degree     permanent pacemaker 02/2004  . Ventricular tachycardia (HCC)     nonsustained  . Systemic hypertension   . Hyperlipidemia   . RBBB   . Arthritis   . CHF (congestive heart failure) ([place])   . ETOH abuse    Past Surgical History  Procedure Laterality Date  . Coronary artery bypass graft  02/10/03    LIMA to LAD,SVG to first diagonal, SVT to OM1  . Permanent pacemaker insertion  03/28/2010    [employer]  . Cardiac catheterization  03/06/04    significant 3 vessel disease, totalled graft obtuse marginal  . Knee arthroscopy Left [phone]  . US echocardiography  01/20/07    mild MR,mild to mod. TR,trace AI,mild PI  . Nm myocar perf wall motion  04/05/09    low risk scan-abnormal-mod. perfusion defect basal inferoseptal,basal inferior,mid inferoseptal,mid inferior and apical inferior regions  . Esophagogastroduodenoscopy N/A 07/29/2015    Procedure: ESOPHAGOGASTRODUODENOSCOPY (EGD);  Surgeon: Clarene Essex, MD;  Location: [facility] ENDOSCOPY;  Service: Endoscopy;  Laterality: N/A;   Social History:   reports that he quit smoking about 12 years ago. He does not have any smokeless tobacco history on file. He reports that he does not drink alcohol or use illicit drugs.  Family History  Problem Relation Age of Onset  . Heart failure Mother   . Heart failure Sister   . Hypertension Sister   . Hypertension Sister   . Liver disease Father   . Colon cancer Neg  Hx     Medications:   Medication List       This list is accurate as of: 08/09/15  9:47 AM.  Always use your most recent med list.               acetaminophen 325 MG tablet  Commonly known as:  TYLENOL  Take 2 tablets (650 mg total) by mouth every 6 (six) hours as needed for mild pain, moderate pain, fever or headache.     erythromycin ophthalmic ointment  Place 1 application into the  left eye every 6 (six) hours. Place 1/2 inch ribbon of ointment in the affected eye 4 times a day     feeding supplement (PRO-STAT SUGAR FREE 64) Liqd  Take 30 mLs by mouth 3 (three) times daily.     folic acid 1 MG tablet  Commonly known as:  FOLVITE  Take 1 tablet (1 mg total) by mouth daily.     lactulose 10 GM/15ML solution  Commonly known as:  CHRONULAC  Take 30 mLs (20 g total) by mouth 3 (three) times daily.     pantoprazole 40 MG tablet  Commonly known as:  PROTONIX  Take 1 tablet (40 mg total) by mouth 2 (two) times daily before a meal.     rifaximin 550 MG Tabs tablet  Commonly known as:  XIFAXAN  Take 1 tablet (550 mg total) by mouth every 12 (twelve) hours. For 21 days (started 08/01/15)     thiamine 100 MG tablet  Take 1 tablet (100 mg total) by mouth daily.         Physical Exam: Filed Vitals:   08/09/15 0945  BP: 133/80  Pulse: 71  Temp: 98.1 F (36.7 C)  Resp: 17  SpO2: 97%    General- elderly male, well built, in no acute distress Head- normocephalic, atraumatic Nose- normal nasal mucosa, no maxillary or frontal sinus tenderness, no nasal discharge Throat- moist mucus membrane Eyes- PERRLA, EOMI, no pallor, no icterus, no discharge, normal conjunctiva, normal sclera Neck- no cervical lymphadenopathy Cardiovascular- normal s1,s2, no murmurs, 1+ leg edema Respiratory- bilateral clear to auscultation, no wheeze, no rhonchi, no crackles, no use of accessory muscles Abdomen- bowel sounds present, soft, non tender Musculoskeletal- able to move all 4 extremities, generalized weakness, no asterexis, on wheelchair Neurological- no focal deficit, alert and oriented to person, place and time Skin- warm and dry Psychiatry- normal mood and affect    Labs reviewed: Basic Metabolic Panel:  Recent Labs  08/01/15 0252 08/02/15 0220 08/03/15 0535 08/04/15 0512  NA 145 147* 139 141  K 3.9 3.8 3.4* 3.4*  CL 112* 113* 105 110  CO2 29 28 28 27   GLUCOSE  112* 96 101* 94  BUN 20 15 11 9   CREATININE 0.86 0.77 0.73 0.81  CALCIUM 8.0* 8.2* 7.8* 7.9*  MG 1.8 1.9 1.6* 1.5*  PHOS 2.0* 3.3 3.2  --    Liver Function Tests:  Recent Labs  07/29/15 0021 07/29/15 0051 08/04/15 0512  AST 47* 41 45*  ALT 26 25 26   ALKPHOS 101 96 88  BILITOT 2.5* 2.8* 1.8*  PROT 6.3* 5.9* 6.1*  ALBUMIN 2.1* 2.0* 1.7*    Recent Labs  07/29/15 0021  LIPASE 22    Recent Labs  07/30/15 0243 07/31/15 0225 08/04/15 0512  AMMONIA 127* 93* 61*   CBC:  Recent Labs  07/29/15 0051  08/01/15 0252 08/02/15 0220 08/03/15 0535  WBC 5.7  < > 9.0 8.8 6.7  NEUTROABS  3.4  --   --   --   --   HGB 12.8*  < > 10.4* 10.5* 11.0*  HCT 36.4*  < > 32.2* 32.8* 33.4*  MCV 96.6  < > 101.9* 101.5* 101.8*  PLT PLATELET CLUMPS NOTED ON SMEAR, UNABLE TO ESTIMATE  < > 32* 38* 39*  < > = values in this interval not displayed. Cardiac Enzymes:  Recent Labs  07/29/15 0713 07/29/15 0930 07/29/15 1303  TROPONINI 0.05* 0.05* 0.07*   BNP: Invalid input(s): POCBNP CBG:  Recent Labs  08/04/15 0357 08/04/15 0810 08/04/15 1200  GLUCAP 92 79 108*    Radiological Exams: Dg Chest Portable 1 View  07/29/2015  CLINICAL DATA:  Post intubation EXAM: PORTABLE CHEST 1 VIEW COMPARISON:  03/29/2010 FINDINGS: Postoperative changes in the mediastinum. Cardiac pacemaker. New placement of endotracheal tube with tip measuring 2.1 cm above the carina. Shallow inspiration. Suggestion of mild blunting of the left costophrenic angle possibly indicating a small effusion. No focal airspace disease or consolidation in the lungs. No pneumothorax. Normal heart size and pulmonary vascularity. IMPRESSION: Endotracheal tube placed with tip measuring 2.1 cm above the carina. Shallow inspiration with possible small left pleural effusion. Electronically Signed   By: Lucienne Capers M.D.   On: 07/29/2015 04:02   Dg Abd Portable 1v  07/29/2015  CLINICAL DATA:  OG tube placement EXAM: PORTABLE  ABDOMEN - 1 VIEW COMPARISON:  None. FINDINGS: OG tube has been placed with the tip in the mid stomach and the side-port in the proximal stomach. Endotracheal tube is approximately 1.6 cm above the carina. Cardiomegaly. Left lower lobe atelectasis or infiltrate. IMPRESSION: OG tube tip in the mid stomach. Endotracheal tube tip approximately 1.6 cm above the carina. Electronically Signed   By: Rolm Baptise M.D.   On: 07/29/2015 13:22    Assessment/Plan  Physical deconditioning Will have him work with physical therapy and occupational therapy team to help with gait training and muscle strengthening exercises.fall precautions. Skin care. Encourage to be out of bed.   Protein calorie malnutrition With liver cirrhosis and alcohol use. Continue folic acid supplement and thiamine supplement. continue feeding supplement. Monitor weight daily for now and get dietary consult  Gi bleed From gastric ulcer and gastritis. Monitor h&h. Avoid NSAIDs. Continue protonix 40 mg bid for now. Advised on etoh cessation  Gastric ulcer Continue protonix 40 mg bid, no bleed reported.  Leg edema Add lasix 20 mg daily for now, daily weight, keep legs elevated at rest and monitor bmp. Add kcl 20 meq daily with hypokalemia and addition of lasix which can worsen it  Thrombocytopenia From liver cirrhosis and recent gi bleed. Monitor platelet count. No bleed reported  dysphagia Regular diet with thin liquids, aspiration precautions and follow up with SLP team  Liver cirrhosis Currently aaox 3. Continue lactulose 20 g/ 30 cc tid with goal for 2 bowel movement a day. Continue rifaxamin 550 mg bid x 21 days for now. Monitor LFT  Hypokalemia Check bmp with mg level. Start kcl from today  Anemia of chronic disease Monitor h&h, he is s/p blood trasnfusion in hospital  HTN bp stable here, bp meds were held in hospital with hypotnesion. Check bp daily for now with goal bp < 140/90  CAD S/p CABG and has pacemaker.  Aspirin held in hospital with gi bleed and coagulaopathy and thrombocytopenia. b blocker held with hypotension and statin with hepatic encephalopathy. Monitor clinically for now  Goals of care: short term rehabilitation   Labs/tests  ordered: cbc, cmp, mg, ammonia on 08/11/15  Family/ staff Communication: reviewed care plan with patient and nursing supervisor    Blanchie Serve, MD  [place] [phone] (Monday-Friday 8 am - 5 pm) [phone] (afterhours)

## 2015-08-11 LAB — BASIC METABOLIC PANEL
BUN: 11 mg/dL (ref 4–21)
CREATININE: 0.8 mg/dL (ref 0.6–1.3)
Glucose: 85 mg/dL
POTASSIUM: 3.9 mmol/L (ref 3.4–5.3)
SODIUM: 139 mmol/L (ref 137–147)

## 2015-08-11 LAB — CBC AND DIFFERENTIAL
HCT: 30 % — AB (ref 41–53)
Hemoglobin: 9.8 g/dL — AB (ref 13.5–17.5)
PLATELETS: 60 10*3/uL — AB (ref 150–399)
WBC: 4.8 10^3/mL

## 2015-08-11 LAB — HEPATIC FUNCTION PANEL
ALK PHOS: 102 U/L (ref 25–125)
ALT: 19 U/L (ref 10–40)
AST: 35 U/L (ref 14–40)
Bilirubin, Total: 1 mg/dL

## 2015-08-12 ENCOUNTER — Encounter: Payer: Medicare Other | Admitting: Cardiovascular Disease

## 2015-08-15 ENCOUNTER — Non-Acute Institutional Stay (SKILLED_NURSING_FACILITY): Payer: Medicare Other | Admitting: Nurse Practitioner

## 2015-08-15 DIAGNOSIS — E8809 Other disorders of plasma-protein metabolism, not elsewhere classified: Secondary | ICD-10-CM | POA: Diagnosis not present

## 2015-08-15 DIAGNOSIS — K2971 Gastritis, unspecified, with bleeding: Secondary | ICD-10-CM

## 2015-08-15 DIAGNOSIS — R7989 Other specified abnormal findings of blood chemistry: Secondary | ICD-10-CM

## 2015-08-15 NOTE — Progress Notes (Signed)
Patient ID: Hunter Carter, male   DOB: 1951/02/06, 65 y.o.   MRN: [id number]    Nursing Home Location:  [place] of Service: SNF (31)  PCP: Elizabeth Palau, MD  No Known Allergies  Chief Complaint  Patient presents with  . Acute Visit    abnormal labs    HPI:  Patient is a 64 y.o. male seen today at [facility] for follow up on abnormal lab results.  Hunter Carter was hospitalized 07/28/15-08/04/15 for GI bleed requiring transfusion. Hunter Carter also had acute encephalopathy thought to be from liver cirrhosis, icu delirium and ETOH withdrawal.  Hunter Carter is currently on lactulose and xifaxan.  Today Hunter Carter feels well, reports that Hunter Carter has a good appetite and has been working with therapy.  Hunter Carter denies hematemesis, hematochezia. Hunter Carter is alert and oriented x4. Nursing without any acute concerns.   Review of Systems:  Review of Systems  Constitutional: Negative for fever, chills and fatigue.  HENT: Negative for congestion, postnasal drip and rhinorrhea.   Respiratory: Negative for cough, shortness of breath and wheezing.   Cardiovascular: Positive for leg swelling. Negative for chest pain and palpitations.  Gastrointestinal: Negative for abdominal pain, diarrhea and constipation.  Genitourinary: Negative for dysuria, frequency and flank pain.  Musculoskeletal: Negative for myalgias, arthralgias and gait problem.  Skin: Negative for color change, pallor, rash and wound.  Neurological: Negative for facial asymmetry, weakness and light-headedness.  Psychiatric/Behavioral: Negative for sleep disturbance and agitation. The patient is not nervous/anxious.     Past Medical History  Diagnosis Date  . CAD (coronary artery disease)   . Inferior MI ([place]) 2005  . S/P CABG x 3 02/10/2003    LIMA to LAD, SVG to first diagonal,SVG to OM1  . Heart block AV second degree     permanent pacemaker 02/2004  . Ventricular tachycardia (HCC)     nonsustained  . Systemic  hypertension   . Hyperlipidemia   . RBBB   . Arthritis   . CHF (congestive heart failure) ([place])   . ETOH abuse    Past Surgical History  Procedure Laterality Date  . Coronary artery bypass graft  02/10/03    LIMA to LAD,SVG to first diagonal, SVT to OM1  . Permanent pacemaker insertion  03/28/2010    [employer]  . Cardiac catheterization  03/06/04    significant 3 vessel disease, totalled graft obtuse marginal  . Knee arthroscopy Left [phone]  . US echocardiography  01/20/07    mild MR,mild to mod. TR,trace AI,mild PI  . Nm myocar perf wall motion  04/05/09    low risk scan-abnormal-mod. perfusion defect basal inferoseptal,basal inferior,mid inferoseptal,mid inferior and apical inferior regions  . Esophagogastroduodenoscopy N/A 07/29/2015    Procedure: ESOPHAGOGASTRODUODENOSCOPY (EGD);  Surgeon: Clarene Essex, MD;  Location: [facility] ENDOSCOPY;  Service: Endoscopy;  Laterality: N/A;   Social History:   reports that Hunter Carter quit smoking about 12 years ago. Hunter Carter does not have any smokeless tobacco history on file. Hunter Carter reports that Hunter Carter does not drink alcohol or use illicit drugs.  Family History  Problem Relation Age of Onset  . Heart failure Mother   . Heart failure Sister   . Hypertension Sister   . Hypertension Sister   . Liver disease Father   . Colon cancer Neg Hx     Medications: Patient's Medications  New Prescriptions   No medications on file  Previous Medications   ACETAMINOPHEN (TYLENOL) 325 MG TABLET  Take 2 tablets (650 mg total) by mouth every 6 (six) hours as needed for mild pain, moderate pain, fever or headache.   AMINO ACIDS-PROTEIN HYDROLYS (FEEDING SUPPLEMENT, PRO-STAT SUGAR FREE 64,) LIQD    Take 30 mLs by mouth 3 (three) times daily.   ERYTHROMYCIN OPHTHALMIC OINTMENT    Place 1 application into the left eye every 6 (six) hours. Place 1/2 inch ribbon of ointment in the affected eye 4 times a day   FOLIC ACID (FOLVITE) 1 MG TABLET    Take 1 tablet (1 mg total) by mouth  daily.   LACTULOSE (CHRONULAC) 10 GM/15ML SOLUTION    Take 30 mLs (20 g total) by mouth 3 (three) times daily.   PANTOPRAZOLE (PROTONIX) 40 MG TABLET    Take 1 tablet (40 mg total) by mouth 2 (two) times daily before a meal.   RIFAXIMIN (XIFAXAN) 550 MG TABS TABLET    Take 1 tablet (550 mg total) by mouth every 12 (twelve) hours. For 21 days (started 08/01/15)   THIAMINE 100 MG TABLET    Take 1 tablet (100 mg total) by mouth daily.  Modified Medications   No medications on file  Discontinued Medications   No medications on file     Physical Exam: Filed Vitals:   08/15/15 0941  BP: 130/89  Pulse: 78  Temp: 99.1 F (37.3 C)  TempSrc: Oral  Resp: 20  Weight: 235 lb (106.595 kg)    Physical Exam  Constitutional: Hunter Carter is oriented to person, place, and time. Hunter Carter appears well-developed and well-nourished. No distress.  HENT:  Head: Normocephalic and atraumatic.  Mouth/Throat: Oropharynx is clear and moist.  Eyes: Pupils are equal, round, and reactive to light.  Neck: Normal range of motion. Neck supple.  Cardiovascular: Normal rate, regular rhythm, normal heart sounds and intact distal pulses.   No murmur heard. Pulmonary/Chest: Effort normal and breath sounds normal.  Abdominal: Soft. Hunter Carter exhibits no distension. There is no tenderness.  Musculoskeletal: Hunter Carter exhibits edema (BLE +3 edema.).  Lymphadenopathy:    Hunter Carter has no cervical adenopathy.  Neurological: Hunter Carter is alert and oriented to person, place, and time.  Skin: Skin is warm and dry. Hunter Carter is not diaphoretic.  Psychiatric: Hunter Carter has a normal mood and affect.    Labs reviewed: Basic Metabolic Panel:  Recent Labs  08/01/15 0252 08/02/15 0220 08/03/15 0535 08/04/15 0512 08/11/15 1437  NA 145 147* 139 141 139  K 3.9 3.8 3.4* 3.4* 3.9  CL 112* 113* 105 110  --   CO2 29 28 28 27   --   GLUCOSE 112* 96 101* 94  --   BUN 20 15 11 9 11   CREATININE 0.86 0.77 0.73 0.81 0.8  CALCIUM 8.0* 8.2* 7.8* 7.9*  --   MG 1.8 1.9 1.6* 1.5*  --     PHOS 2.0* 3.3 3.2  --   --   Magnesium 08/11/15- 1.5 Liver Function Tests:  Recent Labs  07/29/15 0021 07/29/15 0051 08/04/15 0512 08/11/15 1437  AST 47* 41 45* 35  ALT 26 25 26 19   ALKPHOS 101 96 88 102  BILITOT 2.5* 2.8* 1.8*  --   PROT 6.3* 5.9* 6.1*  --   ALBUMIN 2.1* 2.0* 1.7*  --     Recent Labs  07/29/15 0021  LIPASE 22    Recent Labs  07/30/15 0243 07/31/15 0225 08/04/15 0512  AMMONIA 127* 93* 61*  08/11/15 - 124.6 CBC:  Recent Labs  07/29/15 0051  08/01/15 0252 08/02/15 0220  08/03/15 0535 08/11/15 1437  WBC 5.7  < > 9.0 8.8 6.7 4.8  NEUTROABS 3.4  --   --   --   --   --   HGB 12.8*  < > 10.4* 10.5* 11.0* 9.8*  HCT 36.4*  < > 32.2* 32.8* 33.4* 30*  MCV 96.6  < > 101.9* 101.5* 101.8*  --   PLT PLATELET CLUMPS NOTED ON SMEAR, UNABLE TO ESTIMATE  < > 32* 38* 39* 60*  < > = values in this interval not displayed. TSH: No results for input(s): TSH in the last 8760 hours. A1C: No results found for: HGBA1C Lipid Panel: No results for input(s): CHOL, HDL, LDLCALC, TRIG, CHOLHDL, LDLDIRECT in the last 8760 hours.  Radiological Exams: Dg Chest Portable 1 View  07/29/2015  CLINICAL DATA:  Post intubation EXAM: PORTABLE CHEST 1 VIEW COMPARISON:  03/29/2010 FINDINGS: Postoperative changes in the mediastinum. Cardiac pacemaker. New placement of endotracheal tube with tip measuring 2.1 cm above the carina. Shallow inspiration. Suggestion of mild blunting of the left costophrenic angle possibly indicating a small effusion. No focal airspace disease or consolidation in the lungs. No pneumothorax. Normal heart size and pulmonary vascularity. IMPRESSION: Endotracheal tube placed with tip measuring 2.1 cm above the carina. Shallow inspiration with possible small left pleural effusion. Electronically Signed   By: Lucienne Capers M.D.   On: 07/29/2015 04:02   Dg Abd Portable 1v  07/29/2015  CLINICAL DATA:  OG tube placement EXAM: PORTABLE ABDOMEN - 1 VIEW COMPARISON:   None. FINDINGS: OG tube has been placed with the tip in the mid stomach and the side-port in the proximal stomach. Endotracheal tube is approximately 1.6 cm above the carina. Cardiomegaly. Left lower lobe atelectasis or infiltrate. IMPRESSION: OG tube tip in the mid stomach. Endotracheal tube tip approximately 1.6 cm above the carina. Electronically Signed   By: Rolm Baptise M.D.   On: 07/29/2015 13:22    Assessment/Plan 1. Gastrointestinal hemorrhage associated with gastritis, unspecified gastritis Hemoglobin down to 9.8 from 11.5.  Patient denies hematemesis and dark colored stools.  Will hemocult stools x3. Repeat CBC in am.    2. Increased ammonia level Level increased to 124 from 90.1.  No signs of encephalopathy. Will increase lactulose to 64ml tid.    3. Hypomagnesemia Magnesium is 1.5.  Will start magnesium 500mg  po daily.   4. Serum albumin decreased Protein low at 6.1.   Patient has good appetite and has been eating well.  Will add med pass BID  5. Edema Worsening LE edema, affecting walking -will start lasix 40 mg daily and potassium 20 meq daily -follow up BMP in 1 week   Imogean Ciampa K. Harle Battiest  [facility] [phone] 8 am - 5 pm) [phone] (after hours)

## 2015-08-17 ENCOUNTER — Non-Acute Institutional Stay (SKILLED_NURSING_FACILITY): Payer: Medicare Other | Admitting: Nurse Practitioner

## 2015-08-17 DIAGNOSIS — R5381 Other malaise: Secondary | ICD-10-CM | POA: Diagnosis not present

## 2015-08-17 DIAGNOSIS — E46 Unspecified protein-calorie malnutrition: Secondary | ICD-10-CM | POA: Diagnosis not present

## 2015-08-17 DIAGNOSIS — R600 Localized edema: Secondary | ICD-10-CM

## 2015-08-17 DIAGNOSIS — K254 Chronic or unspecified gastric ulcer with hemorrhage: Secondary | ICD-10-CM

## 2015-08-17 DIAGNOSIS — D638 Anemia in other chronic diseases classified elsewhere: Secondary | ICD-10-CM

## 2015-08-17 DIAGNOSIS — K7460 Unspecified cirrhosis of liver: Secondary | ICD-10-CM

## 2015-08-17 DIAGNOSIS — I10 Essential (primary) hypertension: Secondary | ICD-10-CM | POA: Diagnosis not present

## 2015-08-17 LAB — CBC AND DIFFERENTIAL: HEMOGLOBIN: 10 g/dL — AB (ref 13.5–17.5)

## 2015-08-17 MED ORDER — LACTULOSE 10 GM/15ML PO SOLN: 30.0000 g | Freq: Three times a day (TID) | ORAL | Status: DC

## 2015-08-17 NOTE — Progress Notes (Signed)
Patient ID: Hunter Carter, male   DOB: June 26, 1951, 64 y.o.   MRN: [id number]    Nursing Home Location:  [place] of Service: SNF (31)  PCP: Elizabeth Palau, MD  No Known Allergies  Chief Complaint  Patient presents with  . Discharge Note    HPI:  Patient is a 64 y.o. male seen today at [facility] for discharge home.  Hunter Carter was hospitalized 07/28/15-08/04/15 for GI bleed requiring transfusion. He also had acute encephalopathy thought to be from liver cirrhosis, icu delirium and ETOH withdrawal.  He is currently on lactulose and xifaxan.  Pt was evaluated earlier this week due to anemia, elevated ammonia levels and abnormal electrolytes. Started on lasix and potassium due to LE edema which is slightly improved/unchanged at this time. Plans to get TED hose to help with this as well. Also elevating legs throughout the day. Pt denies blood in stools or urine. No dizziness or light headedness. Mentation at baseline, without confusion. Patient currently doing well with therapy, now stable to discharge home with home health.    Review of Systems:  Review of Systems  Constitutional: Negative for fever, chills and fatigue.  HENT: Negative for congestion, postnasal drip and rhinorrhea.   Respiratory: Negative for cough, shortness of breath and wheezing.   Cardiovascular: Positive for leg swelling. Negative for chest pain and palpitations.  Gastrointestinal: Negative for abdominal pain, diarrhea and constipation.  Genitourinary: Negative for dysuria, frequency and flank pain.  Musculoskeletal: Negative for myalgias, arthralgias and gait problem.  Skin: Negative for color change, pallor, rash and wound.  Neurological: Negative for facial asymmetry, weakness and light-headedness.  Psychiatric/Behavioral: Negative for sleep disturbance and agitation. The patient is not nervous/anxious.     Past Medical History  Diagnosis Date  . CAD  (coronary artery disease)   . Inferior MI ([place]) 2005  . S/P CABG x 3 02/10/2003    LIMA to LAD, SVG to first diagonal,SVG to OM1  . Heart block AV second degree     permanent pacemaker 02/2004  . Ventricular tachycardia (HCC)     nonsustained  . Systemic hypertension   . Hyperlipidemia   . RBBB   . Arthritis   . CHF (congestive heart failure) ([place])   . ETOH abuse    Past Surgical History  Procedure Laterality Date  . Coronary artery bypass graft  02/10/03    LIMA to LAD,SVG to first diagonal, SVT to OM1  . Permanent pacemaker insertion  03/28/2010    [employer]tr[employer]  . Cardiac catheterization  03/06/04    significant 3 vessel disease, totalled graft obtuse marginal  . Knee arthroscopy Left [phone]  . US echocardiography  01/20/07    mild MR,mild to mod. TR,trace AI,mild PI  . Nm myocar perf wall motion  04/05/09    low risk scan-abnormal-mod. perfusion defect basal inferoseptal,basal inferior,mid inferoseptal,mid inferior and apical inferior regions  . Esophagogastroduodenoscopy N/A 07/29/2015    Procedure: ESOPHAGOGASTRODUODENOSCOPY (EGD);  Surgeon: Clarene Essex, MD;  Location: [facility] ENDOSCOPY;  Service: Endoscopy;  Laterality: N/A;   Social History:   reports that he quit smoking about 12 years ago. He does not have any smokeless tobacco history on file. He reports that he does not drink alcohol or use illicit drugs.  Family History  Problem Relation Age of Onset  . Heart failure Mother   . Heart failure Sister   . Hypertension Sister   . Hypertension Sister   . Liver  disease Father   . Colon cancer Neg Hx     Medications: Patient's Medications  New Prescriptions   No medications on file  Previous Medications   ACETAMINOPHEN (TYLENOL) 325 MG TABLET    Take 2 tablets (650 mg total) by mouth every 6 (six) hours as needed for mild pain, moderate pain, fever or headache.   AMINO ACIDS-PROTEIN HYDROLYS (FEEDING SUPPLEMENT, PRO-STAT SUGAR FREE 64,) LIQD    Take 30 mLs by mouth 3  (three) times daily.   ERYTHROMYCIN OPHTHALMIC OINTMENT    Place 1 application into the left eye every 6 (six) hours. Place 1/2 inch ribbon of ointment in the affected eye 4 times a day   FOLIC ACID (FOLVITE) 1 MG TABLET    Take 1 tablet (1 mg total) by mouth daily.   FUROSEMIDE (LASIX) 40 MG TABLET    Take 40 mg by mouth daily.   MAGNESIUM 500 MG CAPS    Take 500 mg by mouth daily.   PANTOPRAZOLE (PROTONIX) 40 MG TABLET    Take 1 tablet (40 mg total) by mouth 2 (two) times daily before a meal.   POTASSIUM CHLORIDE SA (K-DUR,KLOR-CON) 20 MEQ TABLET    Take 20 mEq by mouth 2 (two) times daily.   RIFAXIMIN (XIFAXAN) 550 MG TABS TABLET    Take 1 tablet (550 mg total) by mouth every 12 (twelve) hours. For 21 days (started 08/01/15)   THIAMINE 100 MG TABLET    Take 1 tablet (100 mg total) by mouth daily.  Modified Medications   Modified Medication Previous Medication   LACTULOSE (CHRONULAC) 10 GM/15ML SOLUTION lactulose (CHRONULAC) 10 GM/15ML solution      Take 45 mLs (30 g total) by mouth 3 (three) times daily.    Take 30 mLs (20 g total) by mouth 3 (three) times daily.  Discontinued Medications   No medications on file     Physical Exam: Filed Vitals:   08/17/15 1318  BP: 134/74  Pulse: 78  Temp: 98.6 F (37 C)  Resp: 20  Weight: 233 lb (105.688 kg)    Physical Exam  Constitutional: He is oriented to person, place, and time. He appears well-developed and well-nourished. No distress.  HENT:  Head: Normocephalic and atraumatic.  Mouth/Throat: Oropharynx is clear and moist.  Eyes: Pupils are equal, round, and reactive to light.  Neck: Normal range of motion. Neck supple.  Cardiovascular: Normal rate, regular rhythm, normal heart sounds and intact distal pulses.   No murmur heard. Pulmonary/Chest: Effort normal and breath sounds normal.  Abdominal: Soft. He exhibits no distension. There is no tenderness.  Musculoskeletal: He exhibits edema (BLE +2 edema.).  Lymphadenopathy:    He  has no cervical adenopathy.  Neurological: He is alert and oriented to person, place, and time.  Skin: Skin is warm and dry. He is not diaphoretic.  Psychiatric: He has a normal mood and affect.    Labs reviewed: Basic Metabolic Panel:  Recent Labs  08/01/15 0252 08/02/15 0220 08/03/15 0535 08/04/15 0512 08/11/15 1437  NA 145 147* 139 141 139  K 3.9 3.8 3.4* 3.4* 3.9  CL 112* 113* 105 110  --   CO2 29 28 28 27   --   GLUCOSE 112* 96 101* 94  --   BUN 20 15 11 9 11   CREATININE 0.86 0.77 0.73 0.81 0.8  CALCIUM 8.0* 8.2* 7.8* 7.9*  --   MG 1.8 1.9 1.6* 1.5*  --   PHOS 2.0* 3.3 3.2  --   --  Magnesium 08/11/15- 1.5 Liver Function Tests:  Recent Labs  07/29/15 0021 07/29/15 0051 08/04/15 0512 08/11/15 1437  AST 47* 41 45* 35  ALT 26 25 26 19   ALKPHOS 101 96 88 102  BILITOT 2.5* 2.8* 1.8*  --   PROT 6.3* 5.9* 6.1*  --   ALBUMIN 2.1* 2.0* 1.7*  --     Recent Labs  07/29/15 0021  LIPASE 22    Recent Labs  07/30/15 0243 07/31/15 0225 08/04/15 0512  AMMONIA 127* 93* 61*  08/11/15 - 124.6 CBC:  Recent Labs  07/29/15 0051  08/01/15 0252 08/02/15 0220 08/03/15 0535 08/11/15 1437 08/17/15  WBC 5.7  < > 9.0 8.8 6.7 4.8  --   NEUTROABS 3.4  --   --   --   --   --   --   HGB 12.8*  < > 10.4* 10.5* 11.0* 9.8* 10.0*  HCT 36.4*  < > 32.2* 32.8* 33.4* 30*  --   MCV 96.6  < > 101.9* 101.5* 101.8*  --   --   PLT PLATELET CLUMPS NOTED ON SMEAR, UNABLE TO ESTIMATE  < > 32* 38* 39* 60*  --   < > = values in this interval not displayed. TSH: No results for input(s): TSH in the last 8760 hours. A1C: No results found for: HGBA1C Lipid Panel: No results for input(s): CHOL, HDL, LDLCALC, TRIG, CHOLHDL, LDLDIRECT in the last 8760 hours.  Radiological Exams: Dg Chest Portable 1 View  07/29/2015  CLINICAL DATA:  Post intubation EXAM: PORTABLE CHEST 1 VIEW COMPARISON:  03/29/2010 FINDINGS: Postoperative changes in the mediastinum. Cardiac pacemaker. New placement of  endotracheal tube with tip measuring 2.1 cm above the carina. Shallow inspiration. Suggestion of mild blunting of the left costophrenic angle possibly indicating a small effusion. No focal airspace disease or consolidation in the lungs. No pneumothorax. Normal heart size and pulmonary vascularity. IMPRESSION: Endotracheal tube placed with tip measuring 2.1 cm above the carina. Shallow inspiration with possible small left pleural effusion. Electronically Signed   By: Lucienne Capers M.D.   On: 07/29/2015 04:02   Dg Abd Portable 1v  07/29/2015  CLINICAL DATA:  OG tube placement EXAM: PORTABLE ABDOMEN - 1 VIEW COMPARISON:  None. FINDINGS: OG tube has been placed with the tip in the mid stomach and the side-port in the proximal stomach. Endotracheal tube is approximately 1.6 cm above the carina. Cardiomegaly. Left lower lobe atelectasis or infiltrate. IMPRESSION: OG tube tip in the mid stomach. Endotracheal tube tip approximately 1.6 cm above the carina. Electronically Signed   By: Rolm Baptise M.D.   On: 07/29/2015 13:22    Assessment/Plan  1. Cirrhosis of liver without ascites, unspecified hepatic cirrhosis type ([place]) Currently alert and oriented, appropriate to situation. Continue lactulose 45 cc tid. Continue rifaxamin 550 mg bid  2.Gastrointestinal hemorrhage associated with gastric ulcer Cont to avoid NSAIDS, and protonix 40 mg BID, Hemoglobin down to 9.8 from 11.5 and follow up of 10 on recent labs, no hematemesis and dark colored stools, hemoccult negative.   3. Protein-calorie malnutrition ([place]) With liver cirrhosis and alcohol use. Continue folic acid and thiamine supplement. continue feeding supplement.  4. Bilateral edema of lower extremity -cont on lasix 40 mg daily, will increase potassium to 20 meq BID at this time. Appears pt was previously taking potassium 20 meq when potassium was 3.3  5. Anemia of chronic disease Stable at this time, stools were hem negative, hgb stable on  recent lab.  will need  follow up by pcp.   6. Essential hypertension -BP meds were held due to hypotension in hospital, blood pressure stable at this time.  7. Physical deconditioning Improvement with therapy. pt is stable for discharge-will need PT/OT/Nursing per home health. No DME needed. Rx written.  will need to follow up with PCP within 2 weeks.    Carlos American. Harle Battiest  [facility] [phone] 8 am - 5 pm) [phone] (after hours)

## 2015-09-04 ENCOUNTER — Other Ambulatory Visit: Payer: Self-pay | Admitting: Cardiovascular Disease

## 2015-09-05 NOTE — Telephone Encounter (Signed)
Patient is way overdue for an appointment and a note to call and schedule has been put on multiple rx's that have been sent in.

## 2015-09-23 ENCOUNTER — Telehealth: Payer: Self-pay | Admitting: Cardiovascular Disease

## 2015-09-26 NOTE — Telephone Encounter (Signed)
Close encounter 

## 2015-09-28 ENCOUNTER — Other Ambulatory Visit: Payer: Self-pay | Admitting: Cardiovascular Disease

## 2015-09-28 MED ORDER — FUROSEMIDE 40 MG PO TABS: 40.0000 mg | ORAL_TABLET | Freq: Every day | ORAL | Status: DC

## 2015-09-28 NOTE — Telephone Encounter (Signed)
Electronic refill for furosemide done.

## 2015-09-28 NOTE — Telephone Encounter (Signed)
°*  STAT* If patient is at the pharmacy, call can be transferred to refill team.   1. Which medications need to be refilled? (please list name of each medication and dose if known) Furosemiode-pt has an appt scheduled for February  2. Which pharmacy/location (including street and city if local pharmacy) is medication to be sent to?[facility]-[phone]  3. Do they need a 30 day or 90 day supply? 30 and refills

## 2015-10-08 ENCOUNTER — Encounter (HOSPITAL_COMMUNITY): Payer: Self-pay | Admitting: Emergency Medicine

## 2015-10-08 ENCOUNTER — Emergency Department (HOSPITAL_COMMUNITY)
Admission: EM | Admit: 2015-10-08 | Discharge: 2015-10-08 | Disposition: A | Discharge status: Home / Self Care | Payer: Medicare Other | Source: Home / Self Care | Attending: Emergency Medicine | Admitting: Emergency Medicine

## 2015-10-08 DIAGNOSIS — I252 Old myocardial infarction: Secondary | ICD-10-CM

## 2015-10-08 DIAGNOSIS — Z87891 Personal history of nicotine dependence: Secondary | ICD-10-CM

## 2015-10-08 DIAGNOSIS — Z8639 Personal history of other endocrine, nutritional and metabolic disease: Secondary | ICD-10-CM

## 2015-10-08 DIAGNOSIS — Z9889 Other specified postprocedural states: Secondary | ICD-10-CM

## 2015-10-08 DIAGNOSIS — I472 Ventricular tachycardia: Secondary | ICD-10-CM

## 2015-10-08 DIAGNOSIS — Z951 Presence of aortocoronary bypass graft: Secondary | ICD-10-CM | POA: Insufficient documentation

## 2015-10-08 DIAGNOSIS — Z950 Presence of cardiac pacemaker: Secondary | ICD-10-CM

## 2015-10-08 DIAGNOSIS — Z8739 Personal history of other diseases of the musculoskeletal system and connective tissue: Secondary | ICD-10-CM | POA: Insufficient documentation

## 2015-10-08 DIAGNOSIS — I2510 Atherosclerotic heart disease of native coronary artery without angina pectoris: Secondary | ICD-10-CM | POA: Insufficient documentation

## 2015-10-08 DIAGNOSIS — R4182 Altered mental status, unspecified: Secondary | ICD-10-CM | POA: Diagnosis not present

## 2015-10-08 DIAGNOSIS — Z79899 Other long term (current) drug therapy: Secondary | ICD-10-CM

## 2015-10-08 DIAGNOSIS — I509 Heart failure, unspecified: Secondary | ICD-10-CM

## 2015-10-08 DIAGNOSIS — K7290 Hepatic failure, unspecified without coma: Secondary | ICD-10-CM | POA: Diagnosis not present

## 2015-10-08 DIAGNOSIS — I10 Essential (primary) hypertension: Secondary | ICD-10-CM | POA: Insufficient documentation

## 2015-10-08 DIAGNOSIS — G479 Sleep disorder, unspecified: Secondary | ICD-10-CM | POA: Insufficient documentation

## 2015-10-08 MED ORDER — DIPHENHYDRAMINE HCL 25 MG PO TABS: 25.0000 mg | ORAL_TABLET | Freq: Every evening | ORAL | Status: DC | PRN

## 2015-10-08 MED ORDER — DIPHENHYDRAMINE HCL 25 MG PO CAPS
50.0000 mg | ORAL_CAPSULE | Freq: Once | ORAL | Status: AC
  Administered 2015-10-08: 50 mg via ORAL
  Filled 2015-10-08: qty 2

## 2015-10-08 MED ORDER — METOCLOPRAMIDE HCL 10 MG PO TABS
10.0000 mg | ORAL_TABLET | Freq: Once | ORAL | Status: DC
  Filled 2015-10-08: qty 1

## 2015-10-08 MED ORDER — METOCLOPRAMIDE HCL 10 MG PO TABS: 10.0000 mg | ORAL_TABLET | Freq: Three times a day (TID) | ORAL | Status: DC | PRN

## 2015-10-08 NOTE — ED Provider Notes (Signed)
CSN: [id number]     Arrival date & time 10/08/15  [id number] History  By signing my name below, I, Hunter Carter, attest that this documentation has been prepared under the direction and in the presence of [name], MD . Electronically Signed: Evelene Carter, Scribe. 10/08/2015. 3:59 AM.    Chief Complaint  Patient presents with  . Insomnia    The history is provided by the patient. No language interpreter was used.     HPI Comments:  Hunter Carter is a 64 y.o. male with a history of CAD, HLD, CHF, and pacemaker,  who presents to the Emergency Department complaining of insomnia x ~1 month. He notes he has been home from rehab for ~ 1.5 months. He denies SOB, CP, and recent episodes of blood in stool.  No alleviating factors noted.   Past Medical History  Diagnosis Date  . CAD (coronary artery disease)   . Inferior MI ([place]) 2005  . S/P CABG x 3 02/10/2003    LIMA to LAD, SVG to first diagonal,SVG to OM1  . Heart block AV second degree     permanent pacemaker 02/2004  . Ventricular tachycardia (HCC)     nonsustained  . Systemic hypertension   . Hyperlipidemia   . RBBB   . Arthritis   . CHF (congestive heart failure) ([place])   . ETOH abuse    Past Surgical History  Procedure Laterality Date  . Coronary artery bypass graft  02/10/03    LIMA to LAD,SVG to first diagonal, SVT to OM1  . Permanent pacemaker insertion  03/28/2010    [employer]  . Cardiac catheterization  03/06/04    significant 3 vessel disease, totalled graft obtuse marginal  . Knee arthroscopy Left [phone]  . US echocardiography  01/20/07    mild MR,mild to mod. TR,trace AI,mild PI  . Nm myocar perf wall motion  04/05/09    low risk scan-abnormal-mod. perfusion defect basal inferoseptal,basal inferior,mid inferoseptal,mid inferior and apical inferior regions  . Esophagogastroduodenoscopy N/A 07/29/2015    Procedure: ESOPHAGOGASTRODUODENOSCOPY (EGD);  Surgeon: Clarene Essex, MD;  Location: [facility] ENDOSCOPY;  Service:  Endoscopy;  Laterality: N/A;   Family History  Problem Relation Age of Onset  . Heart failure Mother   . Heart failure Sister   . Hypertension Sister   . Hypertension Sister   . Liver disease Father   . Colon cancer Neg Hx    Social History  Substance Use Topics  . Smoking status: Former Smoker    Quit date: 01/20/2003  . Smokeless tobacco: Not on file  . Alcohol Use: No    Review of Systems  10 systems reviewed and all are negative for acute change except as noted in the HPI.  Allergies  Review of patient's allergies indicates no known allergies.  Home Medications   Prior to Admission medications   Medication Sig Start Date End Date Taking? Authorizing Provider  acetaminophen (TYLENOL) 325 MG tablet Take 2 tablets (650 mg total) by mouth every 6 (six) hours as needed for mild pain, moderate pain, fever or headache. 08/04/15   Modena Jansky, MD  Amino Acids-Protein Hydrolys (FEEDING SUPPLEMENT, PRO-STAT SUGAR FREE 64,) LIQD Take 30 mLs by mouth 3 (three) times daily. 08/04/15   Modena Jansky, MD  diphenhydrAMINE (BENADRYL) 25 MG tablet Take 1 tablet (25 mg total) by mouth at bedtime as needed for sleep. 10/08/15   [name], MD  erythromycin ophthalmic ointment Place 1 application into the left eye every 6 (six)  hours. Place 1/2 inch ribbon of ointment in the affected eye 4 times a day 05/16/15   Margarita Mail, PA-C  folic acid (FOLVITE) 1 MG tablet Take 1 tablet (1 mg total) by mouth daily. 08/04/15   Modena Jansky, MD  furosemide (LASIX) 40 MG tablet Take 1 tablet (40 mg total) by mouth daily. 09/28/15   Mihai Croitoru, MD  lactulose (CHRONULAC) 10 GM/15ML solution Take 45 mLs (30 g total) by mouth 3 (three) times daily. 08/17/15   Lauree Chandler, NP  Magnesium 500 MG CAPS Take 500 mg by mouth daily.    Historical Provider, MD  metoCLOPramide (REGLAN) 10 MG tablet Take 1 tablet (10 mg total) by mouth every 8 (eight) hours as needed (headache). 10/08/15   [name], MD  metoprolol (LOPRESSOR) 50 MG tablet take 1 tablet by mouth twice a day (NEED OFFICE VISIT) 09/06/15   Sanda Klein, MD  pantoprazole (PROTONIX) 40 MG tablet Take 1 tablet (40 mg total) by mouth 2 (two) times daily before a meal. 08/04/15   Modena Jansky, MD  potassium chloride SA (K-DUR,KLOR-CON) 20 MEQ tablet Take 20 mEq by mouth 2 (two) times daily.    Historical Provider, MD  rifaximin (XIFAXAN) 550 MG TABS tablet Take 1 tablet (550 mg total) by mouth every 12 (twelve) hours. For 21 days (started 08/01/15) 08/04/15   Modena Jansky, MD  thiamine 100 MG tablet Take 1 tablet (100 mg total) by mouth daily. 08/04/15   Modena Jansky, MD   BP 144/88 mmHg  Pulse 77  Temp(Src) 98 F (36.7 C) (Oral)  Resp 16  Ht 5\' 6"  (1.676 m)  Wt 198 lb (89.812 kg)  BMI 31.97 kg/m2  SpO2 99% Physical Exam  Constitutional: He is oriented to person, place, and time. Vital signs are normal. He appears well-developed and well-nourished.  Non-toxic appearance. He does not appear ill. No distress.  HENT:  Head: Normocephalic and atraumatic.  Nose: Nose normal.  Mouth/Throat: Oropharynx is clear and moist. No oropharyngeal exudate.  Eyes: Conjunctivae and EOM are normal. Pupils are equal, round, and reactive to light. No scleral icterus.  Neck: Normal range of motion. Neck supple. No tracheal deviation, no edema, no erythema and normal range of motion present. No thyroid mass and no thyromegaly present.  Cardiovascular: Normal rate, regular rhythm, S1 normal, S2 normal, normal heart sounds, intact distal pulses and normal pulses.  Exam reveals no gallop and no friction rub.   No murmur heard. Pulmonary/Chest: Effort normal and breath sounds normal. No respiratory distress. He has no wheezes. He has no rhonchi. He has no rales.  Abdominal: Soft. Normal appearance and bowel sounds are normal. He exhibits no distension, no ascites and no mass. There is no hepatosplenomegaly. There is no tenderness.  There is no rebound, no guarding and no CVA tenderness.  Musculoskeletal: Normal range of motion. He exhibits no edema or tenderness.  Lymphadenopathy:    He has no cervical adenopathy.  Neurological: He is alert and oriented to person, place, and time. He has normal strength. No cranial nerve deficit or sensory deficit.  Normal strength and sensation in all extremity. Normal cerebellar testing.  Skin: Skin is warm, dry and intact. No petechiae and no rash noted. He is not diaphoretic. No erythema. No pallor.  Psychiatric: He has a normal mood and affect. His behavior is normal. Judgment normal.  Nursing note and vitals reviewed.   ED Course  Procedures   DIAGNOSTIC STUDIES:  Oxygen  Saturation is 99% on RA, normal by my interpretation.    COORDINATION OF CARE:  3:47 AM Discussed treatment plan with pt at bedside and pt agreed to plan.  Labs Review Labs Reviewed - No data to display  Imaging Review No results found.   EKG Interpretation None      MDM   Final diagnoses:  Difficulty sleeping     Patient presents to the emergency department for difficulty sleeping. He states this has been going on for the past 2 months since leaving rehabilitation. He has not tried any medications for this. He was advised to try Benadryl. Reglan was prescribed for intermittent headaches.Despite triage notes, patient denies any "funny feelings" to me.  He denies urinary symptoms.  I do not believe lab work up is necessary.  Patient is advised to follow-up with primary care physician within the next week. He appears well in no acute distress, neurological exam is completely normal. Vital signs were within his normal limits and he is safe for discharge.  I personally performed the services described in this documentation, which was scribed in my presence. The recorded information has been reviewed and is accurate.     [name], MD 10/08/15 1540

## 2015-10-08 NOTE — Discharge Instructions (Signed)
Insomnia Hunter Carter, take benadryl at night as needed to help you sleep.  Take reglan as needed for headaches.  If symptoms worsen, come back to the ED immediately.  Thank you. Insomnia is a sleep disorder that makes it difficult to fall asleep or to stay asleep. Insomnia can cause tiredness (fatigue), low energy, difficulty concentrating, mood swings, and poor performance at work or school.  There are three different ways to classify insomnia:  Difficulty falling asleep.  Difficulty staying asleep.  Waking up too early in the morning. Any type of insomnia can be long-term (chronic) or short-term (acute). Both are common. Short-term insomnia usually lasts for three months or less. Chronic insomnia occurs at least three times a week for longer than three months. CAUSES  Insomnia may be caused by another condition, situation, or substance, such as:  Anxiety.  Certain medicines.  Gastroesophageal reflux disease (GERD) or other gastrointestinal conditions.  Asthma or other breathing conditions.  Restless legs syndrome, sleep apnea, or other sleep disorders.  Chronic pain.  Menopause. This may include hot flashes.  Stroke.  Abuse of alcohol, tobacco, or illegal drugs.  Depression.  Caffeine.   Neurological disorders, such as Alzheimer disease.  An overactive thyroid (hyperthyroidism). The cause of insomnia may not be known. RISK FACTORS Risk factors for insomnia include:  Gender. Women are more commonly affected than men.  Age. Insomnia is more common as you get older.  Stress. This may involve your professional or personal life.  Income. Insomnia is more common in people with lower income.  Lack of exercise.   Irregular work schedule or night shifts.  Traveling between different time zones. SIGNS AND SYMPTOMS If you have insomnia, trouble falling asleep or trouble staying asleep is the main symptom. This may lead to other symptoms, such as:  Feeling  fatigued.  Feeling nervous about going to sleep.  Not feeling rested in the morning.  Having trouble concentrating.  Feeling irritable, anxious, or depressed. TREATMENT  Treatment for insomnia depends on the cause. If your insomnia is caused by an underlying condition, treatment will focus on addressing the condition. Treatment may also include:   Medicines to help you sleep.  Counseling or therapy.  Lifestyle adjustments. HOME CARE INSTRUCTIONS   Take medicines only as directed by your health care provider.  Keep regular sleeping and waking hours. Avoid naps.  Keep a sleep diary to help you and your health care provider figure out what could be causing your insomnia. Include:   When you sleep.  When you wake up during the night.  How well you sleep.   How rested you feel the next day.  Any side effects of medicines you are taking.  What you eat and drink.   Make your bedroom a comfortable place where it is easy to fall asleep:  Put up shades or special blackout curtains to block light from outside.  Use a white noise machine to block noise.  Keep the temperature cool.   Exercise regularly as directed by your health care provider. Avoid exercising right before bedtime.  Use relaxation techniques to manage stress. Ask your health care provider to suggest some techniques that may work well for you. These may include:  Breathing exercises.  Routines to release muscle tension.  Visualizing peaceful scenes.  Cut back on alcohol, caffeinated beverages, and cigarettes, especially close to bedtime. These can disrupt your sleep.  Do not overeat or eat spicy foods right before bedtime. This can lead to digestive discomfort that  can make it hard for you to sleep.  Limit screen use before bedtime. This includes:  Watching TV.  Using your smartphone, tablet, and computer.  Stick to a routine. This can help you fall asleep faster. Try to do a quiet activity,  brush your teeth, and go to bed at the same time each night.  Get out of bed if you are still awake after 15 minutes of trying to sleep. Keep the lights down, but try reading or doing a quiet activity. When you feel sleepy, go back to bed.  Make sure that you drive carefully. Avoid driving if you feel very sleepy.  Keep all follow-up appointments as directed by your health care provider. This is important. SEEK MEDICAL CARE IF:   You are tired throughout the day or have trouble in your daily routine due to sleepiness.  You continue to have sleep problems or your sleep problems get worse. SEEK IMMEDIATE MEDICAL CARE IF:   You have serious thoughts about hurting yourself or someone else.   This information is not intended to replace advice given to you by your health care provider. Make sure you discuss any questions you have with your health care provider.   Document Released: 09/28/2000 Document Revised: 06/22/2015 Document Reviewed: 07/02/2014 Elsevier Interactive Patient Education [employer].

## 2015-10-08 NOTE — ED Notes (Signed)
Pt departed in NAD and with his son.

## 2015-10-08 NOTE — ED Notes (Signed)
Been having trouble sleeping for the past couple weeks. Came in tonight because he had a "real funny feeling". Unable to clarify. Denies pain, SOB, weakness, LOC, N/V/D. Says he gets up to urinate about 5x/night.

## 2015-10-10 ENCOUNTER — Encounter (HOSPITAL_COMMUNITY): Payer: Self-pay | Admitting: *Deleted

## 2015-10-10 ENCOUNTER — Inpatient Hospital Stay (HOSPITAL_COMMUNITY)
Admission: EM | Admit: 2015-10-10 | Discharge: 2015-10-13 | DRG: 442 | Disposition: A | Discharge status: Home Health | Payer: Medicare Other | Attending: Internal Medicine | Admitting: Internal Medicine

## 2015-10-10 ENCOUNTER — Emergency Department (HOSPITAL_COMMUNITY): Payer: Medicare Other

## 2015-10-10 DIAGNOSIS — K7290 Hepatic failure, unspecified without coma: Secondary | ICD-10-CM | POA: Diagnosis not present

## 2015-10-10 DIAGNOSIS — T473X6A Underdosing of saline and osmotic laxatives, initial encounter: Secondary | ICD-10-CM | POA: Diagnosis present

## 2015-10-10 DIAGNOSIS — R4182 Altered mental status, unspecified: Secondary | ICD-10-CM | POA: Diagnosis present

## 2015-10-10 DIAGNOSIS — Z951 Presence of aortocoronary bypass graft: Secondary | ICD-10-CM

## 2015-10-10 DIAGNOSIS — I252 Old myocardial infarction: Secondary | ICD-10-CM | POA: Diagnosis not present

## 2015-10-10 DIAGNOSIS — Z87891 Personal history of nicotine dependence: Secondary | ICD-10-CM | POA: Diagnosis not present

## 2015-10-10 DIAGNOSIS — K7682 Hepatic encephalopathy: Secondary | ICD-10-CM

## 2015-10-10 DIAGNOSIS — Z950 Presence of cardiac pacemaker: Secondary | ICD-10-CM

## 2015-10-10 DIAGNOSIS — B1920 Unspecified viral hepatitis C without hepatic coma: Secondary | ICD-10-CM | POA: Diagnosis present

## 2015-10-10 DIAGNOSIS — G4700 Insomnia, unspecified: Secondary | ICD-10-CM | POA: Diagnosis present

## 2015-10-10 DIAGNOSIS — I5032 Chronic diastolic (congestive) heart failure: Secondary | ICD-10-CM | POA: Diagnosis not present

## 2015-10-10 DIAGNOSIS — Z79899 Other long term (current) drug therapy: Secondary | ICD-10-CM

## 2015-10-10 DIAGNOSIS — I10 Essential (primary) hypertension: Secondary | ICD-10-CM | POA: Diagnosis present

## 2015-10-10 DIAGNOSIS — B1710 Acute hepatitis C without hepatic coma: Secondary | ICD-10-CM | POA: Diagnosis not present

## 2015-10-10 DIAGNOSIS — D6959 Other secondary thrombocytopenia: Secondary | ICD-10-CM | POA: Diagnosis present

## 2015-10-10 DIAGNOSIS — E876 Hypokalemia: Secondary | ICD-10-CM | POA: Diagnosis not present

## 2015-10-10 DIAGNOSIS — M25572 Pain in left ankle and joints of left foot: Secondary | ICD-10-CM | POA: Diagnosis present

## 2015-10-10 DIAGNOSIS — K7030 Alcoholic cirrhosis of liver without ascites: Secondary | ICD-10-CM | POA: Diagnosis present

## 2015-10-10 DIAGNOSIS — Z91128 Patient's intentional underdosing of medication regimen for other reason: Secondary | ICD-10-CM | POA: Diagnosis not present

## 2015-10-10 DIAGNOSIS — M7989 Other specified soft tissue disorders: Secondary | ICD-10-CM | POA: Diagnosis not present

## 2015-10-10 DIAGNOSIS — I2510 Atherosclerotic heart disease of native coronary artery without angina pectoris: Secondary | ICD-10-CM | POA: Diagnosis present

## 2015-10-10 DIAGNOSIS — K922 Gastrointestinal hemorrhage, unspecified: Secondary | ICD-10-CM | POA: Diagnosis present

## 2015-10-10 DIAGNOSIS — G9340 Encephalopathy, unspecified: Secondary | ICD-10-CM | POA: Diagnosis not present

## 2015-10-10 DIAGNOSIS — E860 Dehydration: Secondary | ICD-10-CM | POA: Diagnosis not present

## 2015-10-10 DIAGNOSIS — K7200 Acute and subacute hepatic failure without coma: Secondary | ICD-10-CM

## 2015-10-10 DIAGNOSIS — R7989 Other specified abnormal findings of blood chemistry: Secondary | ICD-10-CM

## 2015-10-10 DIAGNOSIS — E785 Hyperlipidemia, unspecified: Secondary | ICD-10-CM | POA: Diagnosis not present

## 2015-10-10 DIAGNOSIS — D696 Thrombocytopenia, unspecified: Secondary | ICD-10-CM | POA: Diagnosis present

## 2015-10-10 HISTORY — DX: Chronic or unspecified gastric ulcer with hemorrhage: K25.4

## 2015-10-10 HISTORY — DX: Unspecified viral hepatitis C without hepatic coma: B19.20

## 2015-10-10 HISTORY — DX: Presence of cardiac pacemaker: Z95.0

## 2015-10-10 HISTORY — DX: Hepatic encephalopathy: K76.82

## 2015-10-10 HISTORY — DX: Gastro-esophageal reflux disease without esophagitis: K21.9

## 2015-10-10 HISTORY — DX: Unspecified cirrhosis of liver: K74.60

## 2015-10-10 HISTORY — DX: Acute and subacute hepatic failure without coma: K72.00

## 2015-10-10 LAB — URINALYSIS, ROUTINE W REFLEX MICROSCOPIC
BILIRUBIN URINE: NEGATIVE
Glucose, UA: NEGATIVE mg/dL
KETONES UR: 15 mg/dL — AB
Leukocytes, UA: NEGATIVE
NITRITE: NEGATIVE
PH: 6.5 (ref 5.0–8.0)
Protein, ur: NEGATIVE mg/dL
Specific Gravity, Urine: 1.021 (ref 1.005–1.030)

## 2015-10-10 LAB — AMMONIA: Ammonia: 136 umol/L — ABNORMAL HIGH (ref 9–35)

## 2015-10-10 LAB — COMPREHENSIVE METABOLIC PANEL
ALK PHOS: 174 U/L — AB (ref 38–126)
ALT: 26 U/L (ref 17–63)
AST: 49 U/L — AB (ref 15–41)
Albumin: 2.3 g/dL — ABNORMAL LOW (ref 3.5–5.0)
Anion gap: 10 (ref 5–15)
BILIRUBIN TOTAL: 1.6 mg/dL — AB (ref 0.3–1.2)
BUN: 7 mg/dL (ref 6–20)
CALCIUM: 8.7 mg/dL — AB (ref 8.9–10.3)
CO2: 23 mmol/L (ref 22–32)
CREATININE: 0.78 mg/dL (ref 0.61–1.24)
Chloride: 107 mmol/L (ref 101–111)
GFR calc Af Amer: >60 mL/min (ref >60)
Glucose, Bld: 107 mg/dL — ABNORMAL HIGH (ref 65–99)
Potassium: 3.5 mmol/L (ref 3.5–5.1)
Sodium: 140 mmol/L (ref 135–145)
TOTAL PROTEIN: 8.1 g/dL (ref 6.5–8.1)

## 2015-10-10 LAB — URINE MICROSCOPIC-ADD ON

## 2015-10-10 LAB — CBC
HCT: 40.4 % (ref 39.0–52.0)
Hemoglobin: 13.5 g/dL (ref 13.0–17.0)
MCH: 29.1 pg (ref 26.0–34.0)
MCHC: 33.4 g/dL (ref 30.0–36.0)
MCV: 87.1 fL (ref 78.0–100.0)
PLATELETS: 53 10*3/uL — AB (ref 150–400)
RBC: 4.64 MIL/uL (ref 4.22–5.81)
RDW: 17.1 % — AB (ref 11.5–15.5)
WBC: 4 10*3/uL (ref 4.0–10.5)

## 2015-10-10 LAB — TROPONIN I: TROPONIN I: 0.03 ng/mL (ref <0.031)

## 2015-10-10 LAB — RAPID URINE DRUG SCREEN, HOSP PERFORMED
Amphetamines: NOT DETECTED
Barbiturates: NOT DETECTED
Benzodiazepines: NOT DETECTED
COCAINE: NOT DETECTED
OPIATES: NOT DETECTED
TETRAHYDROCANNABINOL: NOT DETECTED

## 2015-10-10 LAB — ETHANOL

## 2015-10-10 LAB — APTT: aPTT: 30 seconds (ref 24–37)

## 2015-10-10 LAB — PROTIME-INR
INR: 1.7 — ABNORMAL HIGH (ref 0.00–1.49)
PROTHROMBIN TIME: 20 s — AB (ref 11.6–15.2)

## 2015-10-10 LAB — TYPE AND SCREEN
ABO/RH(D): A POS
ANTIBODY SCREEN: NEGATIVE

## 2015-10-10 LAB — CBG MONITORING, ED: GLUCOSE-CAPILLARY: 113 mg/dL — AB (ref 65–99)

## 2015-10-10 MED ORDER — LACTULOSE 10 GM/15ML PO SOLN
30.0000 g | Freq: Three times a day (TID) | ORAL | Status: DC
Start: 2015-10-10 — End: 2015-10-12
  Administered 2015-10-10 – 2015-10-12 (×6): 30 g via ORAL
  Filled 2015-10-10 (×6): qty 45

## 2015-10-10 MED ORDER — OXYCODONE HCL 5 MG PO TABS
5.0000 mg | ORAL_TABLET | ORAL | Status: DC | PRN
  Administered 2015-10-11: 5 mg via ORAL
  Filled 2015-10-10: qty 1

## 2015-10-10 MED ORDER — ERYTHROMYCIN 5 MG/GM OP OINT
1.0000 | TOPICAL_OINTMENT | Freq: Four times a day (QID) | OPHTHALMIC | Status: DC
Start: 2015-10-10 — End: 2015-10-10

## 2015-10-10 MED ORDER — HYDROXYZINE HCL 50 MG/ML IM SOLN
25.0000 mg | Freq: Four times a day (QID) | INTRAMUSCULAR | Status: DC | PRN
  Filled 2015-10-10 (×2): qty 0.5

## 2015-10-10 MED ORDER — SODIUM CHLORIDE 0.9 % IV BOLUS (SEPSIS): 1000.0000 mL | Freq: Once | INTRAVENOUS | Status: DC

## 2015-10-10 MED ORDER — SODIUM CHLORIDE 0.9 % IV SOLN
INTRAVENOUS | Status: DC
  Administered 2015-10-10: 21:00:00 via INTRAVENOUS

## 2015-10-10 MED ORDER — SODIUM CHLORIDE 0.9 % IJ SOLN
3.0000 mL | Freq: Two times a day (BID) | INTRAMUSCULAR | Status: DC
  Administered 2015-10-10 – 2015-10-13 (×5): 3 mL via INTRAVENOUS

## 2015-10-10 MED ORDER — LACTULOSE 10 GM/15ML PO SOLN: 30.0000 g | Freq: Three times a day (TID) | ORAL | Status: DC

## 2015-10-10 MED ORDER — METOPROLOL TARTRATE 50 MG PO TABS
50.0000 mg | ORAL_TABLET | Freq: Two times a day (BID) | ORAL | Status: DC
  Administered 2015-10-10 – 2015-10-13 (×6): 50 mg via ORAL
  Filled 2015-10-10 (×6): qty 1

## 2015-10-10 MED ORDER — RIFAXIMIN 550 MG PO TABS
550.0000 mg | ORAL_TABLET | Freq: Two times a day (BID) | ORAL | Status: DC
  Administered 2015-10-10 – 2015-10-13 (×6): 550 mg via ORAL
  Filled 2015-10-10 (×6): qty 1

## 2015-10-10 MED ORDER — PHYTONADIONE 1 MG/0.5 ML ORAL SOLUTION
5.0000 mg | Freq: Once | ORAL | Status: AC
  Administered 2015-10-10: 5 mg via ORAL
  Filled 2015-10-10: qty 2.5

## 2015-10-10 MED ORDER — MAGNESIUM 500 MG PO CAPS
500.0000 mg | ORAL_CAPSULE | Freq: Every day | ORAL | Status: DC
  Filled 2015-10-10: qty 1

## 2015-10-10 MED ORDER — PANTOPRAZOLE SODIUM 40 MG PO TBEC
40.0000 mg | DELAYED_RELEASE_TABLET | Freq: Two times a day (BID) | ORAL | Status: DC
  Administered 2015-10-11 – 2015-10-13 (×5): 40 mg via ORAL
  Filled 2015-10-10 (×5): qty 1

## 2015-10-10 MED ORDER — HYDRALAZINE HCL 20 MG/ML IJ SOLN: 5.0000 mg | INTRAMUSCULAR | Status: DC | PRN

## 2015-10-10 MED ORDER — PRO-STAT SUGAR FREE PO LIQD
30.0000 mL | Freq: Three times a day (TID) | ORAL | Status: DC
Start: 2015-10-10 — End: 2015-10-13
  Administered 2015-10-10 – 2015-10-13 (×8): 30 mL via ORAL
  Filled 2015-10-10 (×8): qty 30

## 2015-10-10 MED ORDER — FOLIC ACID 1 MG PO TABS
1.0000 mg | ORAL_TABLET | Freq: Every day | ORAL | Status: DC
  Administered 2015-10-10 – 2015-10-13 (×4): 1 mg via ORAL
  Filled 2015-10-10 (×4): qty 1

## 2015-10-10 MED ORDER — VITAMIN B-1 100 MG PO TABS
100.0000 mg | ORAL_TABLET | Freq: Every day | ORAL | Status: DC
  Administered 2015-10-10 – 2015-10-13 (×4): 100 mg via ORAL
  Filled 2015-10-10 (×4): qty 1

## 2015-10-10 NOTE — H&P (Addendum)
[facility] History and Physical  Hunter Carter [id number] DOB: [birth year] DOA: 10/10/2015  Referring physician: ED physician PCP: Elizabeth Palau, MD  Specialists:   Chief Complaint: AMS, left ankle and leg pain and swelling  HPI: Hunter Carter is a 64 y.o. male with PMH of cirrhosis, CAD status post CABG 3, dCHF, HTN, HLD, secondary AV block, S/P of pacemaker placement, who presents with altered mental status, and the left ankle pain and swelling.  Pt's family reports that pt has stopped taking lactulose for about 3 weeks due to too much diarrhea. He has been confused for about one week. He also has been having dark urine per family, but no symptoms of UTI. Pt also experiencing insomnia and left lower extremity and ankle pain and swelling, no injury. Hunter Carter patient in emergency room, he is confused, but still oriented to the place, but not time. He recognizes his son, but does not know who his current president. He denies chest pain, cough, shortness of breath, abdominal pain, diarrhea, symptoms of UTI, unilateral weakness. He does not have fever, chills.  In ED, patient was found to have ammonia of 136, INR 1.70, WBC 4.0, platelet 53, hemoglobin 13.5, temperature normal, no tachycardia, electrolytes okay, abnormal liver function with ALP 174, total bilirubin 1.6, AST 49, ALT 26, Albumin 2.3. negative chest x-ray. Patient's admitted to inpatient for further evaluation and treatment.  Where does patient live?   At home    Can patient participate in ADLs? Barely   Review of Systems: Could not be reviewed the due to altered mental status.  Allergy: No Known Allergies  Past Medical History  Diagnosis Date  . CAD (coronary artery disease)   . Inferior MI ([place]) 2005  . S/P CABG x 3 02/10/2003    LIMA to LAD, SVG to first diagonal,SVG to OM1  . Heart block AV second degree     permanent pacemaker 02/2004  . Ventricular tachycardia (HCC)     nonsustained  . Systemic  hypertension   . Hyperlipidemia   . RBBB   . Arthritis   . CHF (congestive heart failure) ([place])   . ETOH abuse       Social History:  reports that he quit smoking about 12 years ago. He does not have any smokeless tobacco history on file. He reports that he does not drink alcohol or use illicit drugs.  Family History:  Family History  Problem Relation Age of Onset  . Heart failure Mother   . Heart failure Sister   . Hypertension Sister   . Hypertension Sister   . Liver disease Father   . Colon cancer Neg Hx      Prior to Admission medications   Medication Sig Start Date End Date Taking? Authorizing Provider  Magnesium 500 MG CAPS Take 500 mg by mouth daily.   Yes Historical Provider, MD  acetaminophen (TYLENOL) 325 MG tablet Take 2 tablets (650 mg total) by mouth every 6 (six) hours as needed for mild pain, moderate pain, fever or headache. 08/04/15   Modena Jansky, MD  Amino Acids-Protein Hydrolys (FEEDING SUPPLEMENT, PRO-STAT SUGAR FREE 64,) LIQD Take 30 mLs by mouth 3 (three) times daily. 08/04/15   Modena Jansky, MD  diphenhydrAMINE (BENADRYL) 25 MG tablet Take 1 tablet (25 mg total) by mouth at bedtime as needed for sleep. 10/08/15   [name], MD  erythromycin ophthalmic ointment Place 1 application into the left eye every 6 (six) hours. Place 1/2 inch ribbon of ointment in  the affected eye 4 times a day 05/16/15   Margarita Mail, PA-C  folic acid (FOLVITE) 1 MG tablet Take 1 tablet (1 mg total) by mouth daily. 08/04/15   Modena Jansky, MD  furosemide (LASIX) 40 MG tablet Take 1 tablet (40 mg total) by mouth daily. 09/28/15   Mihai Croitoru, MD  lactulose (CHRONULAC) 10 GM/15ML solution Take 45 mLs (30 g total) by mouth 3 (three) times daily. 08/17/15   Lauree Chandler, NP  metoCLOPramide (REGLAN) 10 MG tablet Take 1 tablet (10 mg total) by mouth every 8 (eight) hours as needed (headache). 10/08/15   [name], MD  metoprolol (LOPRESSOR) 50 MG tablet take 1 tablet  by mouth twice a day (NEED OFFICE VISIT) 09/06/15   Sanda Klein, MD  pantoprazole (PROTONIX) 40 MG tablet Take 1 tablet (40 mg total) by mouth 2 (two) times daily before a meal. 08/04/15   Modena Jansky, MD  potassium chloride SA (K-DUR,KLOR-CON) 20 MEQ tablet Take 20 mEq by mouth 2 (two) times daily.    Historical Provider, MD  rifaximin (XIFAXAN) 550 MG TABS tablet Take 1 tablet (550 mg total) by mouth every 12 (twelve) hours. For 21 days (started 08/01/15) 08/04/15   Modena Jansky, MD  thiamine 100 MG tablet Take 1 tablet (100 mg total) by mouth daily. 08/04/15   Modena Jansky, MD    Physical Exam: Filed Vitals:   10/10/15 1449 10/10/15 1747 10/10/15 1800  BP: 130/94 144/90 144/85  Pulse: 74    Temp: 97.8 F (36.6 C)    TempSrc: Oral    Resp: 16 20 18   SpO2: 100%     General: Not in acute distress. Dry mucous and membrane  HEENT:       Eyes: PERRL, EOMI, no scleral icterus.       ENT: No discharge from the ears and nose, no pharynx injection, no tonsillar enlargement.        Neck: No JVD, no bruit, no mass felt. Heme: No neck lymph node enlargement. Cardiac: S1/S2, RRR, No murmurs, No gallops or rubs. Pulm:  No rales, wheezing, rhonchi or rubs. Abd: Soft, nondistended, nontender, no rebound pain, no organomegaly, BS present. Ext: 1+ left leg and ankle swelling and tenderness over left ankle, no warmth or redness, 2+DP/PT pulse bilaterally. Musculoskeletal: No joint deformities, No joint redness or warmth, no limitation of ROM in spin. Skin: No rashes.  Neuro: confused, oriented to place and partially to person, but not time, cranial nerves II-XII grossly intact, moves all extremities. Psych: Could not be reviewed due to altered mental status.   Labs on Admission:  Basic Metabolic Panel:  Recent Labs Lab 10/10/15 1535  NA 140  K 3.5  CL 107  CO2 23  GLUCOSE 107*  BUN 7  CREATININE 0.78  CALCIUM 8.7*   Liver Function Tests:  Recent Labs Lab  10/10/15 1535  AST 49*  ALT 26  ALKPHOS 174*  BILITOT 1.6*  PROT 8.1  ALBUMIN 2.3*   No results for input(s): LIPASE, AMYLASE in the last 168 hours.  Recent Labs Lab 10/10/15 1741  AMMONIA 136*   CBC:  Recent Labs Lab 10/10/15 1535  WBC 4.0  HGB 13.5  HCT 40.4  MCV 87.1  PLT 53*   Cardiac Enzymes: No results for input(s): CKTOTAL, CKMB, CKMBINDEX, TROPONINI in the last 168 hours.  BNP (last 3 results) No results for input(s): BNP in the last 8760 hours.  ProBNP (last 3 results) No results  for input(s): PROBNP in the last 8760 hours.  CBG:  Recent Labs Lab 10/10/15 1503  GLUCAP 113*    Radiological Exams on Admission: Dg Chest 2 View  10/10/2015  CLINICAL DATA:  Altered mental status. EXAM: CHEST  2 VIEW COMPARISON:  08/02/2015 FINDINGS: There is a left chest wall pacer device with lead in the right atrial appendage and right ventricle. Cardiac enlargement. Status post median sternotomy. IMPRESSION: 1. Cardiac enlargement. 2. No heart failure. Electronically Signed   By: Kerby Moors M.D.   On: 10/10/2015 19:04    EKG: Independently reviewed.  QTC 532, bifascicular block    Assessment/Plan Principal Problem:   Encephalopathy acute Active Problems:   HTN (hypertension)   CAD s/p CABG 2004   Pacemaker - dual chamber Medtronic 2011   Hepatitis C   Hyperlipidemia   GI bleed   Hepatic encephalopathy (HCC)   Pain, joint, ankle, left   Thrombocytopathia (HCC)   HLD (hyperlipidemia)   Chronic diastolic (congestive) heart failure (HCC)  Acute Encephalopathy and hepatic encephalopathy: Patient's altered mental status is most likely due to hepatic encephalopathy. He stopped taking lactulose for about 3 weeks, now his ammonia level is 136, though this is not diagnostic, but it is consistent with hepatic encephalopathy. Currently patient is hemodynamically stable. No signs of infection. No abdominal pain, less likely to have SBP.  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 AST 49, ALT 26, total bilirubin 1.6, which may explain his dark urine. -Avoid tylenol -follow up by CMET -give Vk 5 mg x 1 orally -on rifaximin -Hold Lasix since patient is clinically dry.  HTN: -on metoprolol -IV hydralazine when necessary  CAD: s/p CABG 2004: No chest pain, but pt is confused, he may not be able to tell. -trop x 3 -Continue metoprolol  HLD: Last LDL was not on record. Patient is not taking medications at home. -Check FLP  Thrombocytopenia: Most likely due to cirrhosis. Platelets 53. No bleeding tendency. -Follow-up by CBC  Left leg and ankle swelling: etiology is not clear -LE venous doppler to r/o DVT  Chronic diastolic (congestive) heart failure ([place]): 2-D echo on 5/20/5 showed EF 55-60 percent. Patient is on Lasix at home. No leg edema. CHF is compensated. -Hold Lasix -Check BNP  Hx of GIB: hgb stable - On protonix  DVT ppx: SCD (only applied to right Leg now, since we need to r/o DVT in left leg)  Code Status: Full code Family Communication:  Yes, patient's son and daughter at bed side Disposition Plan: Admit to inpatient   Date of Service 10/10/2015    Ivor Costa [facility] Pager [phone]  If 7PM-7AM, please contact night-coverage [email or web address] Password [email or web address] 10/10/2015, 8:11 PM

## 2015-10-10 NOTE — Progress Notes (Signed)
Report received from [place], RN. Pt is to be admitted in 5W25. Awaiting pt's arrival.

## 2015-10-10 NOTE — Progress Notes (Signed)
NURSING PROGRESS NOTE  [name] [id number] Admission Data: 10/10/2015 9:00 PM Attending Provider: Ivor Costa, MD [id number] VINCENT, MD Code Status: Full   Hunter Carter is a 64 y.o. male patient admitted from ED:  -No acute distress noted.  -No complaints of shortness of breath.  -No complaints of chest pain.   Cardiac Monitoring: Box # 09 in place. Cardiac monitor yields:normal sinus rhythm.  Blood pressure 149/97, pulse 71, temperature 97.6 F (36.4 C), temperature source Oral, resp. rate 18, height 5\' 6"  (1.676 m), weight 92.3 kg (203 lb 7.8 oz), SpO2 99 %.   IV Fluids:  IV in place, occlusive dsg intact without redness, IV cath hand left, condition patent and no redness normal saline.   Allergies:  Review of patient's allergies indicates no known allergies.  Past Medical History:   has a past medical history of CAD (coronary artery disease); Inferior MI ([place]) (2005); S/P CABG x 3 (02/10/2003); Heart block AV second degree; Ventricular tachycardia ([place]); Systemic hypertension; Hyperlipidemia; RBBB; Arthritis; CHF (congestive heart failure) ([place]); and ETOH abuse.  Past Surgical History:   has past surgical history that includes Coronary artery bypass graft (02/10/03); Permanent pacemaker insertion (03/28/2010); Cardiac catheterization (03/06/04); Knee arthroscopy (Left, [phone]); US ECHOCARDIOGRAPHY (01/20/07); NM MYOCAR PERF WALL MOTION (04/05/09); and Esophagogastroduodenoscopy (N/A, 07/29/2015).   Skin: Intact  Patient/Family orientated to room.  Admission inpatient armband information verified with patient/family to include name and date of birth and placed on patient arm. Side rails up x 2, fall assessment and education completed with patient/family. Patient/family able to verbalize understanding of risk associated with falls and verbalized understanding to call for assistance before getting out of bed. Call light within reach. Patient/family able to voice and  demonstrate understanding of unit orientation instructions.

## 2015-10-10 NOTE — ED Provider Notes (Signed)
CSN: [id number]     Arrival date & time 10/10/15  1357 History   First MD Initiated Contact with Patient 10/10/15 1700     Chief Complaint  Patient presents with  . Altered Mental Status  . Insomnia     (Consider location/radiation/quality/duration/timing/severity/associated sxs/prior Treatment) HPI Comments: 64 year old male with history of liver cirrhosis, hepatitis C, hypertension, coronary artery disease presents for change in mental status and insomnia. The patient reports that he has been feeling confused for some time. He also states he has not been sleeping. I discussed the case on the phone with his daughter Roanna Epley reports that the patient has not been himself has been very confused and usually would know the date and time but has had difficulty answering this question. She reports that her mother did die in May and that the patient has had issues she believes with depression. The patient has not been sleeping well and has had night sweats for his not slept at all recently. He was seen here 2 days ago for difficulty sleeping. He reports that he has been taking the medications to help with this but it has made no difference. Patient stated that it was December 16 and could not tell me the year when asked for the date.  Patient is a 64 y.o. male presenting with altered mental status and insomnia.  Altered Mental Status Presenting symptoms: confusion   Associated symptoms: no abdominal pain, no fever, no light-headedness, no nausea, no palpitations, no rash and no vomiting   Insomnia Pertinent negatives include no chest pain, no abdominal pain and no shortness of breath.    Past Medical History  Diagnosis Date  . CAD (coronary artery disease)   . Inferior MI ([place]) 2005  . S/P CABG x 3 02/10/2003    LIMA to LAD, SVG to first diagonal,SVG to OM1  . Heart block AV second degree     permanent pacemaker 02/2004  . Ventricular tachycardia (HCC)     nonsustained  . Systemic hypertension    . Hyperlipidemia   . RBBB   . Arthritis   . CHF (congestive heart failure) ([place])   . ETOH abuse    Past Surgical History  Procedure Laterality Date  . Coronary artery bypass graft  02/10/03    LIMA to LAD,SVG to first diagonal, SVT to OM1  . Permanent pacemaker insertion  03/28/2010    Medtronic  . Cardiac catheterization  03/06/04    significant 3 vessel disease, totalled graft obtuse marginal  . Knee arthroscopy Left [phone]  . US echocardiography  01/20/07    mild MR,mild to mod. TR,trace AI,mild PI  . Nm myocar perf wall motion  04/05/09    low risk scan-abnormal-mod. perfusion defect basal inferoseptal,basal inferior,mid inferoseptal,mid inferior and apical inferior regions  . Esophagogastroduodenoscopy N/A 07/29/2015    Procedure: ESOPHAGOGASTRODUODENOSCOPY (EGD);  Surgeon: Clarene Essex, MD;  Location[facility];  Service: Endoscopy;  Laterality: N/A;   Family History  Problem Relation Age of Onset  . Heart failure Mother   . Heart failure Sister   . Hypertension Sister   . Hypertension Sister   . Liver disease Father   . Colon cancer Neg Hx    Social History  Substance Use Topics  . Smoking status: Former Smoker    Quit date: 01/20/2003  . Smokeless tobacco: None  . Alcohol Use: No    Review of Systems  Constitutional: Negative for fever, chills and fatigue.  HENT: Negative for congestion, postnasal drip and  rhinorrhea.   Eyes: Negative for pain and redness.  Respiratory: Negative for cough, chest tightness and shortness of breath.   Cardiovascular: Positive for leg swelling. Negative for chest pain and palpitations.  Gastrointestinal: Negative for nausea, vomiting, abdominal pain and diarrhea.  Genitourinary: Negative for dysuria, urgency and frequency.  Musculoskeletal: Negative for myalgias, back pain and neck pain.  Skin: Negative for rash.  Neurological: Negative for dizziness, light-headedness and numbness.  Psychiatric/Behavioral: Positive for  confusion and dysphoric mood. Negative for suicidal ideas and self-injury. The patient has insomnia.       Allergies  Review of patient's allergies indicates no known allergies.  Home Medications   Prior to Admission medications   Medication Sig Start Date End Date Taking? Authorizing Provider  Magnesium 500 MG CAPS Take 500 mg by mouth daily.   Yes Historical Provider, MD  acetaminophen (TYLENOL) 325 MG tablet Take 2 tablets (650 mg total) by mouth every 6 (six) hours as needed for mild pain, moderate pain, fever or headache. 08/04/15   Modena Jansky, MD  Amino Acids-Protein Hydrolys (FEEDING SUPPLEMENT, PRO-STAT SUGAR FREE 64,) LIQD Take 30 mLs by mouth 3 (three) times daily. 08/04/15   Modena Jansky, MD  diphenhydrAMINE (BENADRYL) 25 MG tablet Take 1 tablet (25 mg total) by mouth at bedtime as needed for sleep. 10/08/15   [name], MD  erythromycin ophthalmic ointment Place 1 application into the left eye every 6 (six) hours. Place 1/2 inch ribbon of ointment in the affected eye 4 times a day 05/16/15   Margarita Mail, PA-C  folic acid (FOLVITE) 1 MG tablet Take 1 tablet (1 mg total) by mouth daily. 08/04/15   Modena Jansky, MD  furosemide (LASIX) 40 MG tablet Take 1 tablet (40 mg total) by mouth daily. 09/28/15   Mihai Croitoru, MD  lactulose (CHRONULAC) 10 GM/15ML solution Take 45 mLs (30 g total) by mouth 3 (three) times daily. 08/17/15   Lauree Chandler, NP  metoCLOPramide (REGLAN) 10 MG tablet Take 1 tablet (10 mg total) by mouth every 8 (eight) hours as needed (headache). 10/08/15   [name], MD  metoprolol (LOPRESSOR) 50 MG tablet take 1 tablet by mouth twice a day (NEED OFFICE VISIT) 09/06/15   Sanda Klein, MD  pantoprazole (PROTONIX) 40 MG tablet Take 1 tablet (40 mg total) by mouth 2 (two) times daily before a meal. 08/04/15   Modena Jansky, MD  potassium chloride SA (K-DUR,KLOR-CON) 20 MEQ tablet Take 20 mEq by mouth 2 (two) times daily.    Historical  Provider, MD  rifaximin (XIFAXAN) 550 MG TABS tablet Take 1 tablet (550 mg total) by mouth every 12 (twelve) hours. For 21 days (started 08/01/15) 08/04/15   Modena Jansky, MD  thiamine 100 MG tablet Take 1 tablet (100 mg total) by mouth daily. 08/04/15   Modena Jansky, MD   BP 144/85 mmHg  Pulse 74  Temp(Src) 97.8 F (36.6 C) (Oral)  Resp 18  SpO2 100% Physical Exam  Constitutional: No distress.  HENT:  Head: Normocephalic and atraumatic.  Right Ear: External ear normal.  Left Ear: External ear normal.  Mouth/Throat: Oropharynx is clear and moist. No oropharyngeal exudate.  Eyes: EOM are normal. Pupils are equal, round, and reactive to light.  Neck: Normal range of motion. Neck supple.  Cardiovascular: Normal rate and intact distal pulses.   No murmur heard. Pulmonary/Chest: Effort normal. No respiratory distress. He has no wheezes. He has no rales.  Abdominal: Soft. He  exhibits no distension. There is no tenderness.  Musculoskeletal: He exhibits edema (1+, symmetric, bilateral lower extremity). He exhibits no tenderness.  Neurological: He exhibits normal muscle tone.  Patient oriented to person and place but not to time.  Symmetric strength in all extremities. Patient moving all extremities spontaneously.  Skin: No rash noted. He is not diaphoretic.  Vitals reviewed.   ED Course  Procedures (including critical care time) Labs Review Labs Reviewed  COMPREHENSIVE METABOLIC PANEL - Abnormal; Notable for the following:    Glucose, Bld 107 (*)    Calcium 8.7 (*)    Albumin 2.3 (*)    AST 49 (*)    Alkaline Phosphatase 174 (*)    Total Bilirubin 1.6 (*)    All other components within normal limits  CBC - Abnormal; Notable for the following:    RDW 17.1 (*)    Platelets 53 (*)    All other components within normal limits  URINALYSIS, ROUTINE W REFLEX MICROSCOPIC (NOT AT [facility]) - Abnormal; Notable for the following:    Color, Urine AMBER (*)    APPearance CLOUDY (*)     Hgb urine dipstick TRACE (*)    Ketones, ur 15 (*)    All other components within normal limits  AMMONIA - Abnormal; Notable for the following:    Ammonia 136 (*)    All other components within normal limits  PROTIME-INR - Abnormal; Notable for the following:    Prothrombin Time 20.0 (*)    INR 1.70 (*)    All other components within normal limits  URINE MICROSCOPIC-ADD ON - Abnormal; Notable for the following:    Squamous Epithelial / LPF 0-5 (*)    Bacteria, UA RARE (*)    Casts HYALINE CASTS (*)    All other components within normal limits  CBG MONITORING, ED - Abnormal; Notable for the following:    Glucose-Capillary 113 (*)    All other components within normal limits    Imaging Review Dg Chest 2 View  10/10/2015  CLINICAL DATA:  Altered mental status. EXAM: CHEST  2 VIEW COMPARISON:  08/02/2015 FINDINGS: There is a left chest wall pacer device with lead in the right atrial appendage and right ventricle. Cardiac enlargement. Status post median sternotomy. IMPRESSION: 1. Cardiac enlargement. 2. No heart failure. Electronically Signed   By: Kerby Moors M.D.   On: 10/10/2015 19:04   I have personally reviewed and evaluated these images and lab results as part of my medical decision-making.   EKG Interpretation   Date/Time:  Monday October 10 2015 17:46:22 EST Ventricular Rate:  76 PR Interval:  148 QRS Duration: 151 QT Interval:  473 QTC Calculation: 532 R Axis:   51 Text Interpretation:  Sinus rhythm Supraventricular bigeminy Right bundle  branch block Inferior infarct, old Abnormal ekg No significant change  since last tracing Confirmed by Lonia Skinner ([id number]) on 10/10/2015  6:52:05 PM      MDM  Patient was seen and evaluated in stable condition. Patient appears confused. Is able to answer basic questions. Not oriented to time although oriented to place and person. On further questioning of the family the patient has not been taking any lactulose outpatient. He  said they were told that he was discharged on 2-amount and then they just stopped giving it. Laboratory results revealed an elevated ammonia level but were otherwise relatively unremarkable. Chest x-ray with cardiac enlargement but otherwise unremarkable. EKG without significant change from previous. Lactulose was ordered. Patient and family did  on results and plan for admission. Discussed case with Dr. Blaine Hamper who agreed with admission and patient was admitted to telemetry under his care. Final diagnoses:  Increased ammonia level  Altered mental status, unspecified altered mental status type    1. Altered mental status 2. Elevated ammonia 3. Encephalopathy    Harvel Quale, MD 10/10/15 [phone]

## 2015-10-10 NOTE — ED Notes (Signed)
Patient transported to X-ray 

## 2015-10-10 NOTE — ED Notes (Signed)
Pt attempting to use the urinal.

## 2015-10-10 NOTE — ED Notes (Addendum)
Pt's family reports patient has an extensive PMH to include liver failure and hepatitis - pt has been confused/AMS x1 week, pt also w/ very dark urine per family. Pt also experiencing insomnia and lower extremity pain. Pt admits to ankle pain and feeling confused, pt oriented to person and place.

## 2015-10-11 ENCOUNTER — Inpatient Hospital Stay (HOSPITAL_COMMUNITY): Payer: Medicare Other

## 2015-10-11 DIAGNOSIS — M7989 Other specified soft tissue disorders: Secondary | ICD-10-CM

## 2015-10-11 DIAGNOSIS — I5032 Chronic diastolic (congestive) heart failure: Secondary | ICD-10-CM

## 2015-10-11 DIAGNOSIS — B1710 Acute hepatitis C without hepatic coma: Secondary | ICD-10-CM

## 2015-10-11 LAB — CBC
HCT: 35.3 % — ABNORMAL LOW (ref 39.0–52.0)
HEMOGLOBIN: 11.6 g/dL — AB (ref 13.0–17.0)
MCH: 28.3 pg (ref 26.0–34.0)
MCHC: 32.9 g/dL (ref 30.0–36.0)
MCV: 86.1 fL (ref 78.0–100.0)
Platelets: 49 10*3/uL — ABNORMAL LOW (ref 150–400)
RBC: 4.1 MIL/uL — AB (ref 4.22–5.81)
RDW: 17.1 % — ABNORMAL HIGH (ref 11.5–15.5)
WBC: 4.3 10*3/uL (ref 4.0–10.5)

## 2015-10-11 LAB — COMPREHENSIVE METABOLIC PANEL
ALT: 23 U/L (ref 17–63)
ANION GAP: 8 (ref 5–15)
AST: 40 U/L (ref 15–41)
Albumin: 2 g/dL — ABNORMAL LOW (ref 3.5–5.0)
Alkaline Phosphatase: 147 U/L — ABNORMAL HIGH (ref 38–126)
BUN: 9 mg/dL (ref 6–20)
CHLORIDE: 109 mmol/L (ref 101–111)
CO2: 24 mmol/L (ref 22–32)
Calcium: 8.3 mg/dL — ABNORMAL LOW (ref 8.9–10.3)
Creatinine, Ser: 0.77 mg/dL (ref 0.61–1.24)
GFR calc non Af Amer: >60 mL/min (ref >60)
Glucose, Bld: 97 mg/dL (ref 65–99)
Potassium: 3.3 mmol/L — ABNORMAL LOW (ref 3.5–5.1)
SODIUM: 141 mmol/L (ref 135–145)
Total Bilirubin: 1.4 mg/dL — ABNORMAL HIGH (ref 0.3–1.2)
Total Protein: 6.9 g/dL (ref 6.5–8.1)

## 2015-10-11 LAB — TROPONIN I
TROPONIN I: 0.03 ng/mL (ref <0.031)
Troponin I: 0.04 ng/mL — ABNORMAL HIGH (ref <0.031)

## 2015-10-11 LAB — PROTIME-INR
INR: 1.59 — ABNORMAL HIGH (ref 0.00–1.49)
INR: 1.72 — AB (ref 0.00–1.49)
Prothrombin Time: 19 seconds — ABNORMAL HIGH (ref 11.6–15.2)
Prothrombin Time: 20.2 seconds — ABNORMAL HIGH (ref 11.6–15.2)

## 2015-10-11 LAB — LIPID PANEL
CHOL/HDL RATIO: 3.5 ratio
CHOLESTEROL: 111 mg/dL (ref 0–200)
HDL: 32 mg/dL — ABNORMAL LOW (ref >40)
LDL Cholesterol: 66 mg/dL (ref 0–99)
TRIGLYCERIDES: 64 mg/dL (ref <150)
VLDL: 13 mg/dL (ref 0–40)

## 2015-10-11 LAB — BRAIN NATRIURETIC PEPTIDE: B NATRIURETIC PEPTIDE 5: 55.2 pg/mL (ref 0.0–100.0)

## 2015-10-11 LAB — GLUCOSE, CAPILLARY: Glucose-Capillary: 117 mg/dL — ABNORMAL HIGH (ref 65–99)

## 2015-10-11 MED ORDER — MAGNESIUM OXIDE 400 (241.3 MG) MG PO TABS
400.0000 mg | ORAL_TABLET | Freq: Every day | ORAL | Status: DC
  Administered 2015-10-11 – 2015-10-13 (×3): 400 mg via ORAL
  Filled 2015-10-11 (×3): qty 1

## 2015-10-11 MED ORDER — DIPHENHYDRAMINE HCL 25 MG PO CAPS
25.0000 mg | ORAL_CAPSULE | Freq: Every day | ORAL | Status: DC
  Administered 2015-10-11 – 2015-10-12 (×2): 25 mg via ORAL
  Filled 2015-10-11 (×2): qty 1

## 2015-10-11 MED ORDER — POTASSIUM CHLORIDE CRYS ER 20 MEQ PO TBCR
40.0000 meq | EXTENDED_RELEASE_TABLET | ORAL | Status: AC
  Administered 2015-10-11 (×2): 40 meq via ORAL
  Filled 2015-10-11 (×2): qty 2

## 2015-10-11 NOTE — Progress Notes (Addendum)
[facility] Progress Note   Hunter Carter  [id number]  DOB: [birth year]  DOA: 10/10/2015 PCP: Elizabeth Palau, MD  Brief narrative: Hunter Carter is a 64 y.o. male with cirrhosis, CAD s/p CABG, d CHF, HTN, HLD, AV block s/p pacer presenting with confusion and left leg pain and swelling. He admitted to stopping his Lactulose about 3 wks ago due to too much diarrhea. He is admitted for hepatic encephalopathy    Subjective: Drowsy. Difficult to understand his speech. No complaints of pain, nausea, vomiting or diarrhea. Son states he has problems with insomnia as well.   Assessment/Plan: Principal Problem:   Encephalopathy acute  / Hepatitis C and alcoholic cirrhosis - cont Lactulose and Xifaxin- discussed with son about finding the right dose where he is having 2-3 Bms per day  Active Problems:   HTN (hypertension) - cont Lopressor  Left leg swelling and pain - no pain currently- Duplex negative for DVT  Insomnia - takes Benadryl at home which I will continue  Recent upper GI bleed - EGD on 10/14 revealed mild gastritis- cont Protonix - colonoscopy 10/6 showed diverticulosis     CAD s/p CABG 2004    Pacemaker - dual chamber Medtronic 2011  Elevated INR - due to cirrhosis? - Vit K given- follow  Chronic diastolic (congestive) heart failure - appeared dehydrated and therefore was given IVF overnight- I have stopped these today- resume Lasix tomorrow  Hypokalemia - cont to replace    Code Status:     Code Status Orders        Start     Ordered   10/10/15 1939  Full code   Continuous     10/10/15 1940     Family Communication: son at bedside Disposition Plan: home once more alert DVT prophylaxis: SCDs Consultants: Procedures:   Antibiotics: Anti-infectives    Start     Dose/Rate Route Frequency Ordered Stop   10/10/15 2200  rifaximin (XIFAXAN) tablet 550 mg     550 mg Oral Every 12 hours 10/10/15 1937        Objective: Filed  Weights   10/10/15 2038  Weight: 92.3 kg (203 lb 7.8 oz)    Intake/Output Summary (Last 24 hours) at 10/11/15 1148 Last data filed at 10/11/15 1125  Gross per 24 hour  Intake  902.5 ml  Output    471 ml  Net  431.5 ml     Vitals Filed Vitals:   10/10/15 1747 10/10/15 1800 10/10/15 2036 10/10/15 2038  BP: 144/90 144/85 155/124 149/97  Pulse:   73 71  Temp:   97.6 F (36.4 C)   TempSrc:   Oral   Resp: 20 18 18    Height:    5\' 6"  (1.676 m)  Weight:    92.3 kg (203 lb 7.8 oz)  SpO2:   99%     Exam:  General:  Pt is sleepy but arousable, not in acute distress  HEENT: No icterus, No thrush, oral mucosa moist  Cardiovascular: regular rate and rhythm, S1/S2 No murmur  Respiratory: clear to auscultation bilaterally   Abdomen: Soft, +Bowel sounds, non tender, non distended, no guarding  MSK: No LE edema, cyanosis or clubbing  Data Reviewed: Basic Metabolic Panel:  Recent Labs Lab 10/10/15 1535 10/11/15 0138  NA 140 141  K 3.5 3.3*  CL 107 109  CO2 23 24  GLUCOSE 107* 97  BUN 7 9  CREATININE 0.78 0.77  CALCIUM 8.7* 8.3*   Liver Function Tests:  Recent  Labs Lab 10/10/15 1535 10/11/15 0138  AST 49* 40  ALT 26 23  ALKPHOS 174* 147*  BILITOT 1.6* 1.4*  PROT 8.1 6.9  ALBUMIN 2.3* 2.0*   No results for input(s): LIPASE, AMYLASE in the last 168 hours.  Recent Labs Lab 10/10/15 1741  AMMONIA 136*   CBC:  Recent Labs Lab 10/10/15 1535 10/11/15 0138  WBC 4.0 4.3  HGB 13.5 11.6*  HCT 40.4 35.3*  MCV 87.1 86.1  PLT 53* 49*   Cardiac Enzymes:  Recent Labs Lab 10/10/15 2120 10/11/15 0138 10/11/15 0830  TROPONINI 0.03 0.04* 0.03   BNP (last 3 results)  Recent Labs  10/11/15 0138  BNP 55.2    ProBNP (last 3 results) No results for input(s): PROBNP in the last 8760 hours.  CBG:  Recent Labs Lab 10/10/15 1503 10/11/15 0800  GLUCAP 113* 117*    No results found for this or any previous visit (from the past 240 hour(s)).    Studies: Dg Chest 2 View  10/10/2015  CLINICAL DATA:  Altered mental status. EXAM: CHEST  2 VIEW COMPARISON:  08/02/2015 FINDINGS: There is a left chest wall pacer device with lead in the right atrial appendage and right ventricle. Cardiac enlargement. Status post median sternotomy. IMPRESSION: 1. Cardiac enlargement. 2. No heart failure. Electronically Signed   By: Kerby Moors M.D.   On: 10/10/2015 19:04    Scheduled Meds:  Scheduled Meds: . diphenhydrAMINE  25 mg Oral QHS  . feeding supplement (PRO-STAT SUGAR FREE 64)  30 mL Oral TID  . folic acid  1 mg Oral Daily  . lactulose  30 g Oral TID  . magnesium oxide  400 mg Oral Daily  . metoprolol  50 mg Oral BID  . pantoprazole  40 mg Oral BID AC  . potassium chloride  40 mEq Oral Q4H  . rifaximin  550 mg Oral Q12H  . sodium chloride  3 mL Intravenous Q12H  . thiamine  100 mg Oral Daily   Continuous Infusions:   Time spent on care of this patient: 35 min   [place], MD 10/11/2015, 11:48 AM  LOS: 1 day   [facility] Office  [phone] Pager - Text Page per [email or web address] If 7PM-7AM, please contact night-coverage [email or web address]

## 2015-10-11 NOTE — Evaluation (Signed)
Occupational Therapy Evaluation Patient Details Name: Hunter Carter MRN: [id number] DOB: [birth year] Today's Date: 10/11/2015    History of Present Illness 64 y.o. male with PMH of cirrhosis, CAD status post CABG 3, dCHF, HTN, HLD, secondary AV block, S/P of pacemaker placement, who presents with altered mental status, and the left ankle pain and swelling.   Clinical Impression   Pt admitted with above. Pt independent with ADLs, PTA (assist with IADLs, per pt). Feel pt will benefit from acute OT to increase independence prior to d/c. Recommending HHOT and 24/7 supervision/assist at home.  Follow Up Recommendations  Home health OT;Supervision/Assistance - 24 hour    Equipment Recommendations  3 in 1 bedside comode    Recommendations for Other Services Speech consult      Precautions / Restrictions Precautions Precautions: Fall Restrictions Weight Bearing Restrictions: No      Mobility Bed Mobility   General bed mobility comments: not assessed-pt in recliner  Transfers Overall transfer level: Needs assistance Equipment used: Rolling walker (2 wheeled) Transfers: Sit to/from Stand Sit to Stand: Min guard         General transfer comment: cues for hand placement.    Balance Ambulated with and without RW-unsteady without RW.                      ADL Overall ADL's : Needs assistance/impaired                     Lower Body Dressing: Min guard;Sit to/from stand   Toilet Transfer: Min guard;Ambulation;RW (sit to stand from chair)           Functional mobility during ADLs: Min guard;Rolling walker (also ambulated short distance without RW)       Vision     Perception     Praxis      Pertinent Vitals/Pain Pain Assessment: Faces Pain Score: 0-No pain     Hand Dominance     Extremity/Trunk Assessment Upper Extremity Assessment Upper Extremity Assessment: Generalized weakness   Lower Extremity Assessment Lower Extremity  Assessment: Defer to PT evaluation   Cervical / Trunk Assessment Cervical / Trunk Assessment: Normal   Communication Communication Communication:  (a little difficult to understand)   Cognition Arousal/Alertness: Lethargic Behavior During Therapy: Flat affect Overall Cognitive Status: No family/caregiver present to determine baseline cognitive functioning Area of Impairment: Following commands;Problem solving       Following Commands: Follows one step commands inconsistently;Follows one step commands with increased time Problem Solving: Slow processing;Requires verbal cues     General Comments       Exercises       Shoulder Instructions      Home Living Family/patient expects to be discharged to:: Private residence Living Arrangements: Children Available Help at Discharge: Family Type of Home: House Home Access: Stairs to enter Technical brewer of Steps: 1   Home Layout: One level (recessed den)               Home Equipment: Kasandra Knudsen - single point;Walker - 2 wheels   Additional Comments: Children with him at night and his brother would be with him several days a week, but didn't have 24 hour supervision      Prior Functioning/Environment Level of Independence: Needs assistance    ADL's / Homemaking Assistance Needed: per pt, assist with cooking and cleaning   Comments: Ambulated with RW. Had recently been at [facility] and had returned home    OT Diagnosis: Generalized weakness;Cognitive  deficits   OT Problem List: Decreased strength;Decreased knowledge of precautions;Decreased knowledge of use of DME or AE;Decreased cognition;Impaired balance (sitting and/or standing);Decreased activity tolerance   OT Treatment/Interventions: Self-care/ADL training;Therapeutic exercise;DME and/or AE instruction;Therapeutic activities;Cognitive remediation/compensation;Patient/family education;Balance training    OT Goals(Current goals can be found in the care plan section)  Acute Rehab OT Goals Patient Stated Goal: to walk OT Goal Formulation: With patient Time For Goal Achievement: 10/18/15 Potential to Achieve Goals: Good ADL Goals Pt Will Perform Lower Body Dressing: with set-up;sit to/from stand;with supervision Pt Will Transfer to Toilet: with supervision;ambulating Pt Will Perform Toileting - Clothing Manipulation and hygiene: with set-up;with supervision;sit to/from stand  OT Frequency: Min 2X/week   Barriers to D/C:            Co-evaluation              End of Session Equipment Utilized During Treatment: Gait belt;Rolling walker Nurse Communication: Other (comment) (wanting food/pt in chair with chair alarm set)  Activity Tolerance: Patient tolerated treatment well Patient left: in chair;with call bell/phone within reach;with chair alarm set   Time: 1141-1155 OT Time Calculation (min): 14 min Charges:  OT General Charges $OT Visit: 1 Procedure OT Evaluation $Initial OT Evaluation Tier I: 1 Procedure G-CodesBenito Mccreedy OTR/L [id number] 10/11/2015, 12:33 PM

## 2015-10-11 NOTE — Progress Notes (Signed)
*  Preliminary Results* Left lower extremity venous duplex completed. Study was technically difficult due to poor patient cooperation.  Visualized veins of the left lower extremity are negative for deep vein thrombosis. There is no evidence of left Baker's cyst.  10/11/2015 10:16 AM  Maudry Mayhew, RVT, RDCS, RDMS

## 2015-10-11 NOTE — Evaluation (Addendum)
Physical Therapy Evaluation Patient Details Name: Hunter Carter MRN: [id number] DOB: 10-29-1950 Today's Date: 10/11/2015   History of Present Illness  64 y.o. male with PMH of cirrhosis, CAD status post CABG 3, dCHF, HTN, HLD, secondary AV block, S/P of pacemaker placement, who presents with altered mental status, and the left ankle pain and swelling.  Clinical Impression  Pt admitted with above diagnosis. Pt currently with functional limitations due to the deficits listed below (see PT Problem List). Pt will benefit from skilled PT to increase their independence and safety with mobility to allow discharge to the venue listed below.  Pt with no complaints of L ankle pain today.  He had difficulty following directions and unable to ambulate with smooth gait pattern.  Discussed need for 24 hour S at this time with son, Hunter Carter.  He states pt became depressed when he went to SNF as he was much younger than many of the people.  Son is interested in pursuing caregivers to A at home for when pt's children are at work and pt's brother is unable to be there, so that pt can return to his home.  Recommend HHPT and 24 hour S.     Follow Up Recommendations Home health PT;Supervision/Assistance - 24 hour- son interested in hiring some caregivers to A.    Equipment Recommendations   None recommended by PT   Recommendations for Other Services       Precautions / Restrictions Precautions Precautions: Fall Restrictions Weight Bearing Restrictions: No      Mobility  Bed Mobility Overal bed mobility: Needs Assistance Bed Mobility: Supine to Sit     Supine to sit: Supervision;HOB elevated     General bed mobility comments: HOB elevated with rail  Transfers Overall transfer level: Needs assistance Equipment used: Rolling walker (2 wheeled) Transfers: Sit to/from Stand Sit to Stand: Min assist         General transfer comment: heavy cueing for hand placement and still had difficulty. MIN A  to power up  Ambulation/Gait Ambulation/Gait assistance: Min assist Ambulation Distance (Feet): 15 Feet Assistive device: Rolling walker (2 wheeled) Gait Pattern/deviations: Decreased step length - right;Decreased step length - left;Drifts right/left Gait velocity: decreased   General Gait Details: Shortened step length with picking up RW and not pushing it smoothly.  Not staying within it and having difficulty following instructions for RW placement. Slow gait and staring into space at times.  Stairs            Wheelchair Mobility    Modified Rankin (Stroke Patients Only)       Balance Overall balance assessment: Needs assistance Sitting-balance support: Feet supported Sitting balance-Leahy Scale: Good     Standing balance support: Bilateral upper extremity supported Standing balance-Leahy Scale: Poor Standing balance comment: requires UE support                             Pertinent Vitals/Pain Pain Assessment: No/denies pain    Home Living Family/patient expects to be discharged to:: Private residence Living Arrangements: Children Available Help at Discharge: Family Type of Home: House Home Access: Stairs to enter   Technical brewer of Steps: 1 Home Layout: One level (recessed den) Home Equipment: Kasandra Knudsen - single point;Walker - 2 wheels Additional Comments: Children with him at night and his brother would be with him several days a week, but didn't have 24 hour S    Prior Function Level of Independence: Independent with assistive  device(s)         Comments: Amb with RW. Had recently been at SNF and had returned home     Hand Dominance        Extremity/Trunk Assessment   Upper Extremity Assessment: Defer to OT evaluation           Lower Extremity Assessment: Generalized weakness      Cervical / Trunk Assessment: Normal  Communication      Cognition Arousal/Alertness: Awake/alert Behavior During Therapy: Flat  affect Overall Cognitive Status: Impaired/Different from baseline Area of Impairment: Following commands;Safety/judgement;Awareness;Problem solving       Following Commands: Follows one step commands with increased time;Follows one step commands inconsistently Safety/Judgement: Decreased awareness of deficits Awareness: Emergent Problem Solving: Slow processing;Requires tactile cues;Requires verbal cues;Difficulty sequencing;Decreased initiation      General Comments General comments (skin integrity, edema, etc.): Son present for a portion of eval.    Exercises        Assessment/Plan    PT Assessment Patient needs continued PT services  PT Diagnosis Difficulty walking   PT Problem List Decreased activity tolerance;Decreased balance;Decreased mobility;Decreased strength;Decreased knowledge of use of DME;Decreased safety awareness  PT Treatment Interventions Gait training;DME instruction;Functional mobility training;Therapeutic activities;Therapeutic exercise;Balance training   PT Goals (Current goals can be found in the Care Plan section) Acute Rehab PT Goals Patient Stated Goal: To go home and not to rehab facility PT Goal Formulation: With patient/family Time For Goal Achievement: 10/18/15 Potential to Achieve Goals: Good    Frequency Min 3X/week   Barriers to discharge Decreased caregiver support Family is interested in [place] some caregivers to A when famiy cannot be present    Co-evaluation               End of Session Equipment Utilized During Treatment: Gait belt Activity Tolerance: Patient tolerated treatment well Patient left: in chair;with call bell/phone within reach;with chair alarm set;with nursing/sitter in room Nurse Communication: Mobility status         Time: 1030-1055 PT Time Calculation (min) (ACUTE ONLY): 25 min   Charges:   PT Evaluation $Initial PT Evaluation Tier I: 1 Procedure PT Treatments $Gait Training: 8-22 mins   PT G  Codes:        Hunter Carter 10/11/2015, 11:17 AM

## 2015-10-11 NOTE — Care Management Note (Signed)
Case Management Note  Patient Details  Name: Nehemia Lenzi MRN: [id number] Date of Birth: Sep 30, 1951  Subjective/Objective:                  Date-10-11-15 Initial Assessment  Spoke with patient at the bedside along with son and two sisters.  Introduced self as Tourist information centre manager and explained role in discharge planning and how to be reached.  Verified patient lives alone at home.  Verified patient anticipates to go home with alone, at time of discharge and will have part-time supervision by family at this time to best of their knowledge.  Patient has DME cane walker  wheelchair. Expressed potential need for no other DME.  Patient denied needing help with their medication.  Patient drives or is driven by family to MD appointments.  Verified patient has PCP [place] Patient states they currently receive [facility] services through no one.  Patient was provided choice and selected [facility] for home health needs if they arise.  Plan: CM will continue to follow for discharge planning and [facility] resources.   Carles Collet RN BSN CM [phone]   Action/Plan:  Will refer to [facility] for [facility] PT  Expected Discharge Date:                  Expected Discharge Plan:  [place]  In-House Referral:     Discharge planning Services  CM Consult  Post Acute Care Choice:  Home Health Choice offered to:  Patient, Spouse  DME Arranged:    DME Agency:     HH Arranged:    HH Agency:     Status of Service:  In process, will continue to follow  Medicare Important Message Given:    Date Medicare IM Given:    Medicare IM give by:    Date Additional Medicare IM Given:    Additional Medicare Important Message give by:     If discussed at [place] of Stay Meetings, dates discussed:    Additional Comments:  Carles Collet, RN 10/11/2015, 3:40 PM

## 2015-10-11 NOTE — Evaluation (Signed)
Clinical/Bedside Swallow Evaluation Patient Details  Name: Hunter Carter MRN: [id number] Date of Birth: 10-Mar-1951  Today's Date: 10/11/2015 Time: SLP Start Time (ACUTE ONLY): [id number] SLP Stop Time (ACUTE ONLY): 1322 SLP Time Calculation (min) (ACUTE ONLY): 18 min  Past Medical History:  Past Medical History  Diagnosis Date  . CAD (coronary artery disease)   . Inferior MI ([place]) 2005  . Heart block AV second degree     permanent pacemaker 02/2004  . Ventricular tachycardia (HCC)     nonsustained  . Systemic hypertension   . Hyperlipidemia   . RBBB   . CHF (congestive heart failure) ([place])   . ETOH abuse   . Acute hepatic encephalopathy ([place]) 10/10/2015    [name] 10/10/2015  . Cirrhosis ([place])     due to HCV/notes 10/10/2015  . Presence of permanent cardiac pacemaker   . GERD (gastroesophageal reflux disease)   . Hepatitis C   . Bleeding stomach ulcer     "recently" (10/10/2015)  . Arthritis     "joints ache" (10/10/2015)   Past Surgical History:  Past Surgical History  Procedure Laterality Date  . Cardiac catheterization  03/06/04    significant 3 vessel disease, totalled graft obtuse marginal  . Knee arthroscopy Left [phone]  . US echocardiography  01/20/07    mild MR,mild to mod. TR,trace AI,mild PI  . Nm myocar perf wall motion  04/05/09    low risk scan-abnormal-mod. perfusion defect basal inferoseptal,basal inferior,mid inferoseptal,mid inferior and apical inferior regions  . Esophagogastroduodenoscopy N/A 07/29/2015    Procedure: ESOPHAGOGASTRODUODENOSCOPY (EGD);  Surgeon: Clarene Essex, MD;  Location[facility] ENDOSCOPY;  Service: Endoscopy;  Laterality: N/A;  . Coronary artery bypass graft  02/10/03    LIMA to LAD,SVG to first diagonal, SVT to OM1  . Insert / replace / remove pacemaker  03/2010    Medtronic  . Total knee arthroplasty Left   . Mediastinal exploration  02/10/2003   HPI:  64 y.o. male with PMH of cirrhosis, CAD status post CABG 3, dCHF, HTN, HLD,  secondary AV block, S/P of pacemaker placement, who presents with altered mental status, and the left ankle pain and swelling. Nursing concerned with pt's ability to swallow safely, so BSE was ordered.   Assessment / Plan / Recommendation Clinical Impression   Pt did not exhibit any overt s/s of aspiration during the BSE, but impaired mastication and prolonged oral propulsion noted d/t lethargy paired with incoordinated/weak tongue movements noted during OME; recommend Dysphagia 3/thin with swallowing precautions to decrease aspiration risk and increase po intake for hydration/nutritive purposes; ST to f/u x1 for diet tolerance/safety.    Aspiration Risk  No limitations    Diet Recommendation   Dysphagia 3/thin  Medication Administration: Whole meds with liquid    Other  Recommendations Oral Care Recommendations: Oral care BID   Follow up Recommendations  None    Frequency and Duration min 1 x/week  1 week       Prognosis Prognosis for Safe Diet Advancement: Good      Swallow Study   General Date of Onset: 10/10/15 HPI: 64 y.o. male with PMH of cirrhosis, CAD status post CABG 3, dCHF, HTN, HLD, secondary AV block, S/P of pacemaker placement, who presents with altered mental status, and the left ankle pain and swelling. Type of Study: Bedside Swallow Evaluation Diet Prior to this Study: Regular;Thin liquids Temperature Spikes Noted: No Respiratory Status: Room air Behavior/Cognition: Alert;Cooperative;Confused;Lethargic/Drowsy Oral Cavity Assessment: Within Functional Limits Oral Care Completed by SLP: No  Oral Cavity - Dentition: Poor condition;Missing dentition Vision: Functional for self-feeding Self-Feeding Abilities: Able to feed self Patient Positioning: Upright in chair Baseline Vocal Quality: Low vocal intensity Volitional Cough: Strong Volitional Swallow: Able to elicit    Oral/Motor/Sensory Function Overall Oral Motor/Sensory Function: Within functional limits    Ice Chips Ice chips: Within functional limits Presentation: Spoon   Thin Liquid Thin Liquid: Within functional limits Presentation: Cup;Straw    Nectar Thick Nectar Thick Liquid: Not tested   Honey Thick Honey Thick Liquid: Not tested   Puree Puree: Within functional limits Presentation: Spoon   Solid Solid: Impaired Presentation: Self Fed Oral Phase Impairments: Impaired mastication Oral Phase Functional Implications: Prolonged oral transit       Venise Ellingwood,PAT, M.S., CCC-SLP 10/11/2015,1:55 PM

## 2015-10-12 LAB — BASIC METABOLIC PANEL
ANION GAP: 6 (ref 5–15)
BUN: 10 mg/dL (ref 6–20)
CO2: 25 mmol/L (ref 22–32)
Calcium: 8.3 mg/dL — ABNORMAL LOW (ref 8.9–10.3)
Chloride: 111 mmol/L (ref 101–111)
Creatinine, Ser: 0.84 mg/dL (ref 0.61–1.24)
GFR calc Af Amer: >60 mL/min (ref >60)
GLUCOSE: 88 mg/dL (ref 65–99)
POTASSIUM: 3.7 mmol/L (ref 3.5–5.1)
Sodium: 142 mmol/L (ref 135–145)

## 2015-10-12 LAB — PROTIME-INR
INR: 1.87 — AB (ref 0.00–1.49)
Prothrombin Time: 21.4 seconds — ABNORMAL HIGH (ref 11.6–15.2)

## 2015-10-12 LAB — GLUCOSE, CAPILLARY: GLUCOSE-CAPILLARY: 76 mg/dL (ref 65–99)

## 2015-10-12 LAB — AMMONIA: Ammonia: 164 umol/L — ABNORMAL HIGH (ref 9–35)

## 2015-10-12 MED ORDER — LACTULOSE ENEMA
300.0000 mL | Freq: Once | ORAL | Status: DC
  Filled 2015-10-12: qty 300

## 2015-10-12 MED ORDER — LACTULOSE 10 GM/15ML PO SOLN
30.0000 g | Freq: Every day | ORAL | Status: DC
  Administered 2015-10-12 – 2015-10-13 (×3): 30 g via ORAL
  Filled 2015-10-12 (×3): qty 45

## 2015-10-12 NOTE — Progress Notes (Addendum)
[facility] Progress Note   Hunter Carter  [id number]  DOB: 05-Dec-1950  DOA: 10/10/2015 PCP: Elizabeth Palau, MD  Brief narrative: Hunter Carter is a 64 y.o. male with cirrhosis, CAD s/p CABG, d CHF, HTN, HLD, AV block s/p pacer presenting with confusion and left leg pain and swelling. He admitted to stopping his Lactulose about 3 wks ago due to too much diarrhea. He is admitted for hepatic encephalopathy    Subjective: Continues to be drowsy. I was able to get him up out of bed and into a chair.   Assessment/Plan: Principal Problem:   Encephalopathy acute  / Hepatitis C and alcoholic cirrhosis - cont Xifaxin - increase Lactulose   Active Problems:   HTN (hypertension) - cont Lopressor  Left leg swelling and pain - no pain currently- Duplex negative for DVT  Insomnia - takes Benadryl at home which I will continue  Recent upper GI bleed - EGD on 10/14 revealed mild gastritis- cont Protonix - colonoscopy 10/6 showed diverticulosis     CAD s/p CABG 2004    Pacemaker - dual chamber Medtronic 2011  Elevated INR - due to cirrhosis? - Vit K given without improvement   Chronic diastolic (congestive) heart failure - appeared dehydrated and therefore was given IVF overnight- I have stopped these- as he has poor PO intake, will cont to hold diuretics  Hypokalemia - replaced     Code Status:     Code Status Orders        Start     Ordered   10/10/15 1939  Full code   Continuous     10/10/15 1940     Family Communication: son at bedside Disposition Plan: home once more alert DVT prophylaxis: SCDs Consultants: Procedures:   Antibiotics: Anti-infectives    Start     Dose/Rate Route Frequency Ordered Stop   10/10/15 2200  rifaximin (XIFAXAN) tablet 550 mg     550 mg Oral Every 12 hours 10/10/15 1937        Objective: Filed Weights   10/10/15 2038  Weight: 92.3 kg (203 lb 7.8 oz)    Intake/Output Summary (Last 24 hours) at 10/12/15  1652 Last data filed at 10/12/15 1209  Gross per 24 hour  Intake      0 ml  Output    980 ml  Net   -980 ml     Vitals Filed Vitals:   10/11/15 1530 10/11/15 2100 10/12/15 0641 10/12/15 1343  BP: 131/78 103/81 130/85 130/92  Pulse: 62 66 63 68  Temp:  98.2 F (36.8 C) 98.4 F (36.9 C) 97.9 F (36.6 C)  TempSrc:  Oral Oral Oral  Resp: 21 18 20 20   Height:      Weight:      SpO2: 100% 100% 98% 98%    Exam:  General:  Pt is sleepy but arousable, not in acute distress  HEENT: No icterus, No thrush, oral mucosa moist  Cardiovascular: regular rate and rhythm, S1/S2 No murmur  Respiratory: clear to auscultation bilaterally   Abdomen: Soft, +Bowel sounds, non tender, non distended, no guarding  MSK: No LE edema, cyanosis or clubbing  Data Reviewed: Basic Metabolic Panel:  Recent Labs Lab 10/10/15 1535 10/11/15 0138 10/12/15 0531  NA 140 141 142  K 3.5 3.3* 3.7  CL 107 109 111  CO2 23 24 25   GLUCOSE 107* 97 88  BUN 7 9 10   CREATININE 0.78 0.77 0.84  CALCIUM 8.7* 8.3* 8.3*   Liver Function Tests:  Recent Labs Lab 10/10/15 1535 10/11/15 0138  AST 49* 40  ALT 26 23  ALKPHOS 174* 147*  BILITOT 1.6* 1.4*  PROT 8.1 6.9  ALBUMIN 2.3* 2.0*   No results for input(s): LIPASE, AMYLASE in the last 168 hours.  Recent Labs Lab 10/10/15 1741 10/12/15 0531  AMMONIA 136* 164*   CBC:  Recent Labs Lab 10/10/15 1535 10/11/15 0138  WBC 4.0 4.3  HGB 13.5 11.6*  HCT 40.4 35.3*  MCV 87.1 86.1  PLT 53* 49*   Cardiac Enzymes:  Recent Labs Lab 10/10/15 2120 10/11/15 0138 10/11/15 0830  TROPONINI 0.03 0.04* 0.03   BNP (last 3 results)  Recent Labs  10/11/15 0138  BNP 55.2    ProBNP (last 3 results) No results for input(s): PROBNP in the last 8760 hours.  CBG:  Recent Labs Lab 10/10/15 1503 10/11/15 0800 10/12/15 0814  GLUCAP 113* 117* 76    No results found for this or any previous visit (from the past 240 hour(s)).   Studies: Dg  Chest 2 View  10/10/2015  CLINICAL DATA:  Altered mental status. EXAM: CHEST  2 VIEW COMPARISON:  08/02/2015 FINDINGS: There is a left chest wall pacer device with lead in the right atrial appendage and right ventricle. Cardiac enlargement. Status post median sternotomy. IMPRESSION: 1. Cardiac enlargement. 2. No heart failure. Electronically Signed   By: Kerby Moors M.D.   On: 10/10/2015 19:04    Scheduled Meds:  Scheduled Meds: . diphenhydrAMINE  25 mg Oral QHS  . feeding supplement (PRO-STAT SUGAR FREE 64)  30 mL Oral TID  . folic acid  1 mg Oral Daily  . lactulose  30 g Oral 5 X Daily  . magnesium oxide  400 mg Oral Daily  . metoprolol  50 mg Oral BID  . pantoprazole  40 mg Oral BID AC  . rifaximin  550 mg Oral Q12H  . sodium chloride  3 mL Intravenous Q12H  . thiamine  100 mg Oral Daily   Continuous Infusions:   Time spent on care of this patient: 35 min   [place], MD 10/12/2015, 4:52 PM  LOS: 2 days   [facility] Office  [phone] Pager - Text Page per [email or web address] If 7PM-7AM, please contact night-coverage [email or web address]

## 2015-10-13 DIAGNOSIS — D691 Qualitative platelet defects: Secondary | ICD-10-CM

## 2015-10-13 DIAGNOSIS — E785 Hyperlipidemia, unspecified: Secondary | ICD-10-CM

## 2015-10-13 LAB — PROTIME-INR
INR: 1.79 — ABNORMAL HIGH (ref 0.00–1.49)
PROTHROMBIN TIME: 20.8 s — AB (ref 11.6–15.2)

## 2015-10-13 LAB — BASIC METABOLIC PANEL
ANION GAP: 4 — AB (ref 5–15)
BUN: 10 mg/dL (ref 6–20)
CALCIUM: 8 mg/dL — AB (ref 8.9–10.3)
CO2: 23 mmol/L (ref 22–32)
Chloride: 110 mmol/L (ref 101–111)
Creatinine, Ser: 0.77 mg/dL (ref 0.61–1.24)
GFR calc non Af Amer: >60 mL/min (ref >60)
Glucose, Bld: 81 mg/dL (ref 65–99)
Potassium: 3.3 mmol/L — ABNORMAL LOW (ref 3.5–5.1)
Sodium: 137 mmol/L (ref 135–145)

## 2015-10-13 LAB — AMMONIA: Ammonia: 145 umol/L — ABNORMAL HIGH (ref 9–35)

## 2015-10-13 LAB — GLUCOSE, CAPILLARY: Glucose-Capillary: 124 mg/dL — ABNORMAL HIGH (ref 65–99)

## 2015-10-13 MED ORDER — LACTULOSE 10 GM/15ML PO SOLN: 30.0000 g | Freq: Three times a day (TID) | ORAL | Status: DC

## 2015-10-13 NOTE — Progress Notes (Signed)
H[facility] Carter unable to accept patient, patient referred to [facility].

## 2015-10-13 NOTE — Progress Notes (Signed)
NURSING PROGRESS NOTE  Hunter Carter [id number] Discharge Data: 10/13/2015 11:30 AM Attending Provider: Debbe Odea, MD [id number] Evette Doffing, MD     Jacalyn Lefevre to be D/C'd Home per MD order.  Discussed with the patient the After Visit Summary and all questions fully answered. All IV's discontinued with no bleeding noted. All belongings returned to patient for patient to take home.   Last Vital Signs:  Blood pressure 116/74, pulse 64, temperature 98.5 F (36.9 C), temperature source Oral, resp. rate 14, height 5\' 6"  (1.676 m), weight 92.3 kg (203 lb 7.8 oz), SpO2 100 %.  Discharge Medication List   Medication List    TAKE these medications        acetaminophen 325 MG tablet  Commonly known as:  TYLENOL  Take 2 tablets (650 mg total) by mouth every 6 (six) hours as needed for mild pain, moderate pain, fever or headache.     diphenhydrAMINE 25 MG tablet  Commonly known as:  BENADRYL  Take 1 tablet (25 mg total) by mouth at bedtime as needed for sleep.     erythromycin ophthalmic ointment  Place 1 application into the left eye every 6 (six) hours. Place 1/2 inch ribbon of ointment in the affected eye 4 times a day     folic acid 1 MG tablet  Commonly known as:  FOLVITE  Take 1 tablet (1 mg total) by mouth daily.     furosemide 40 MG tablet  Commonly known as:  LASIX  Take 1 tablet (40 mg total) by mouth daily.     lactulose 10 GM/15ML solution  Commonly known as:  CHRONULAC  Take 45 mLs (30 g total) by mouth 3 (three) times daily.     Magnesium 500 MG Caps  Take 500 mg by mouth daily.     metoprolol 50 MG tablet  Commonly known as:  LOPRESSOR  take 1 tablet by mouth twice a day (NEED OFFICE VISIT)     pantoprazole 40 MG tablet  Commonly known as:  PROTONIX  Take 1 tablet (40 mg total) by mouth 2 (two) times daily before a meal.     potassium chloride SA 20 MEQ tablet  Commonly known as:  K-DUR,KLOR-CON  Take 20 mEq by mouth 2 (two) times daily.     rifaximin 550 MG Tabs tablet  Commonly known as:  XIFAXAN  Take 1 tablet (550 mg total) by mouth every 12 (twelve) hours. For 21 days (started 08/01/15)     thiamine 100 MG tablet  Take 1 tablet (100 mg total) by mouth daily.         Doristine Devoid, RN

## 2015-10-13 NOTE — Discharge Summary (Signed)
Physician Discharge Summary  Hunter Carter [id number] DOB: 1951-07-23 DOA: 10/10/2015  PCP: Elizabeth Palau, MD  Admit date: 10/10/2015 Discharge date: 10/13/2015  Time spent: 45 minutes    Discharge Condition: stable   Discharge Diagnoses:  Principal Problem:   Encephalopathy acute Active Problems:   HTN (hypertension)   CAD s/p CABG 2004   Pacemaker - dual chamber Medtronic 2011   Hepatitis C   Hyperlipidemia   GI bleed   Hepatic encephalopathy (HCC)   Pain, joint, ankle, left   Thrombocytopathia (HCC)   HLD (hyperlipidemia)   Chronic diastolic (congestive) heart failure ([place])   History of present illness:  Hunter Carter is a 64 y.o. male with cirrhosis, CAD s/p CABG, d CHF, HTN, HLD, AV block s/p pacer presenting with confusion and left leg pain and swelling. He admitted to stopping his Lactulose about 3 wks ago due to too much diarrhea. He is admitted for hepatic encephalopathy.   Hospital Course:  Principal Problem:  Encephalopathy acute / Hepatitis C and alcoholic cirrhosis - cont Xifaxin - increased Lactulose and given a Lactulose enema before mental status improved to baseline- ammonia level is still elvated at 145. Advised to continue Lactulose to have at least 3 BMs daily.   Active Problems:  HTN (hypertension) - cont Lopressor  Left leg swelling and pain - no pain currently- Duplex negative for DVT  Insomnia - takes Benadryl at home which I will continue  Recent upper GI bleed - EGD on 10/14 revealed mild gastritis- cont Protonix - colonoscopy 10/6 showed diverticulosis    CAD s/p CABG 2004   Pacemaker - dual chamber Medtronic 2011  Elevated INR - likely due to cirrhosis - Vit K given without improvement   Chronic diastolic (congestive) heart failure - appeared dehydrated and therefore was given IVF overnight- I have stopped these- as he has poor PO intake, will cont to hold diuretics  Hypokalemia - replaced    Consultations:  none  Discharge Exam: Filed Weights   10/10/15 2038  Weight: 92.3 kg (203 lb 7.8 oz)   Filed Vitals:   10/12/15 1343 10/13/15 0534  BP: 130/92 116/74  Pulse: 68 64  Temp: 97.9 F (36.6 C) 98.5 F (36.9 C)  Resp: 20 14    General: AAO x 3, no distress Cardiovascular: RRR, no murmurs  Respiratory: clear to auscultation bilaterally GI: soft, non-tender, non-distended, bowel sound positive  Discharge Instructions You were cared for by a hospitalist during your hospital stay. If you have any questions about your discharge medications or the care you received while you were in the hospital after you are discharged, you can call the unit and asked to speak with the hospitalist on call if the hospitalist that took care of you is not available. Once you are discharged, your primary care physician will handle any further medical issues. Please note that NO REFILLS for any discharge medications will be authorized once you are discharged, as it is imperative that you return to your primary care physician (or establish a relationship with a primary care physician if you do not have one) for your aftercare needs so that they can reassess your need for medications and monitor your lab values.  Discharge Instructions    Diet - low sodium heart healthy    Complete by:  As directed      Increase activity slowly    Complete by:  As directed             Medication List  TAKE these medications        acetaminophen 325 MG tablet  Commonly known as:  TYLENOL  Take 2 tablets (650 mg total) by mouth every 6 (six) hours as needed for mild pain, moderate pain, fever or headache.     diphenhydrAMINE 25 MG tablet  Commonly known as:  BENADRYL  Take 1 tablet (25 mg total) by mouth at bedtime as needed for sleep.     erythromycin ophthalmic ointment  Place 1 application into the left eye every 6 (six) hours. Place 1/2 inch ribbon of ointment in the affected eye 4 times a day      folic acid 1 MG tablet  Commonly known as:  FOLVITE  Take 1 tablet (1 mg total) by mouth daily.     furosemide 40 MG tablet  Commonly known as:  LASIX  Take 1 tablet (40 mg total) by mouth daily.     lactulose 10 GM/15ML solution  Commonly known as:  CHRONULAC  Take 45 mLs (30 g total) by mouth 3 (three) times daily.     Magnesium 500 MG Caps  Take 500 mg by mouth daily.     metoprolol 50 MG tablet  Commonly known as:  LOPRESSOR  take 1 tablet by mouth twice a day (NEED OFFICE VISIT)     pantoprazole 40 MG tablet  Commonly known as:  PROTONIX  Take 1 tablet (40 mg total) by mouth 2 (two) times daily before a meal.     potassium chloride SA 20 MEQ tablet  Commonly known as:  K-DUR,KLOR-CON  Take 20 mEq by mouth 2 (two) times daily.     rifaximin 550 MG Tabs tablet  Commonly known as:  XIFAXAN  Take 1 tablet (550 mg total) by mouth every 12 (twelve) hours. For 21 days (started 08/01/15)     thiamine 100 MG tablet  Take 1 tablet (100 mg total) by mouth daily.       No Known Allergies    The results of significant diagnostics from this hospitalization (including imaging, microbiology, ancillary and laboratory) are listed below for reference.    Significant Diagnostic Studies: Dg Chest 2 View  10/10/2015  CLINICAL DATA:  Altered mental status. EXAM: CHEST  2 VIEW COMPARISON:  08/02/2015 FINDINGS: There is a left chest wall pacer device with lead in the right atrial appendage and right ventricle. Cardiac enlargement. Status post median sternotomy. IMPRESSION: 1. Cardiac enlargement. 2. No heart failure. Electronically Signed   By: Kerby Moors M.D.   On: 10/10/2015 19:04    Microbiology: No results found for this or any previous visit (from the past 240 hour(s)).   Labs: Basic Metabolic Panel:  Recent Labs Lab 10/10/15 1535 10/11/15 0138 10/12/15 0531 10/13/15 0433  NA 140 141 142 137  K 3.5 3.3* 3.7 3.3*  CL 107 109 111 110  CO2 23 24 25 23   GLUCOSE  107* 97 88 81  BUN 7 9 10 10   CREATININE 0.78 0.77 0.84 0.77  CALCIUM 8.7* 8.3* 8.3* 8.0*   Liver Function Tests:  Recent Labs Lab 10/10/15 1535 10/11/15 0138  AST 49* 40  ALT 26 23  ALKPHOS 174* 147*  BILITOT 1.6* 1.4*  PROT 8.1 6.9  ALBUMIN 2.3* 2.0*   No results for input(s): LIPASE, AMYLASE in the last 168 hours.  Recent Labs Lab 10/10/15 1741 10/12/15 0531 10/13/15 0433  AMMONIA 136* 164* 145*   CBC:  Recent Labs Lab 10/10/15 1535 10/11/15 0138  WBC 4.0 4.3  HGB 13.5 11.6*  HCT 40.4 35.3*  MCV 87.1 86.1  PLT 53* 49*   Cardiac Enzymes:  Recent Labs Lab 10/10/15 2120 10/11/15 0138 10/11/15 0830  TROPONINI 0.03 0.04* 0.03   BNP: BNP (last 3 results)  Recent Labs  10/11/15 0138  BNP 55.2    ProBNP (last 3 results) No results for input(s): PROBNP in the last 8760 hours.  CBG:  Recent Labs Lab 10/10/15 1503 10/11/15 0800 10/12/15 0814 10/13/15 0759  GLUCAP 113* 117* 76 124*       SignedDebbe Odea, MD [facility] 10/13/2015, 8:15 AM

## 2015-10-13 NOTE — Care Management Note (Signed)
Case Management Note  Patient Details  Name: Kealii Chinen MRN: [id number] Date of Birth: 09-30-1951  Subjective/Objective:                  Date-10-11-15 Initial Assessment  Spoke with patient at the bedside along with son and two sisters.  Introduced self as Tourist information centre manager and explained role in discharge planning and how to be reached.  Verified patient lives alone at home.  Verified patient anticipates to go home with alone, at time of discharge and will have part-time supervision by family at this time to best of their knowledge.  Patient has DME cane walker wheelchair. Expressed potential need for no other DME.  Patient denied needing help with their medication.  Patient drives or is driven by family to MD appointments.  Verified patient has PCP [place] Patient states they currently receive [facility] services through no one.  Patient was provided choice and selected [facility] for home health needs if they arise.    Action/Plan:  Referral made to [facility] for [facility] PT, Patient is followed by [facility] and patient states he has an appointment the first week in January. Patient given [facility] number to find new PCP that accepts his insuance.  Expected Discharge Date:                  Expected Discharge Plan:  [place]  In-House Referral:     Discharge planning Services  CM Consult  Post Acute Care Choice:  Home Health Choice offered to:  Patient, Adult Children  DME Arranged:    DME Agency:     HH Arranged:  PT HH Agency:  [place]  Status of Service:  Completed, signed off  Medicare Important Message Given:    Date Medicare IM Given:    Medicare IM give by:    Date Additional Medicare IM Given:    Additional Medicare Important Message give by:     If discussed at [place] of Stay Meetings, dates discussed:    Additional Comments:  Carles Collet, RN 10/13/2015, 11:02 AM

## 2015-10-13 NOTE — Progress Notes (Signed)
Physical Therapy Treatment Patient Details Name: Hunter Carter MRN: [id number] DOB: 08-Feb-1951 Today's Date: 10/13/2015    History of Present Illness 64 y.o. male with PMH of cirrhosis, CAD status post CABG 3, dCHF, HTN, HLD, secondary AV block, S/P of pacemaker placement, who presents with altered mental status, and the left ankle pain and swelling.    PT Comments    Patient continues to progress with mobility and independence.  Patient planning to d/c today with 24 support from family.  Feel patient safe for this discharge plan from mobility aspect.  Will need continued PT to continue to progress mobility and independence, balance and strengthening.    Follow Up Recommendations  Home health PT;Supervision/Assistance - 24 hour     Equipment Recommendations  None recommended by PT    Recommendations for Other Services       Precautions / Restrictions Precautions Precautions: Fall Restrictions Weight Bearing Restrictions: No    Mobility  Bed Mobility               General bed mobility comments: up in chair upon arrival  Transfers Overall transfer level: Modified independent Equipment used: Rolling walker (2 wheeled) Transfers: Sit to/from Stand Sit to Stand: Modified independent (Device/Increase time)            Ambulation/Gait Ambulation/Gait assistance: Supervision Ambulation Distance (Feet): 50 Feet Assistive device: Rolling walker (2 wheeled) Gait Pattern/deviations: Step-to pattern;Antalgic;Decreased stance time - left Gait velocity: decreased   General Gait Details: still with antalgic gait on left; cueing to stay with RW and not let RW get ahead of him.     Stairs            Wheelchair Mobility    Modified Rankin (Stroke Patients Only)       Balance   Sitting-balance support: No upper extremity supported Sitting balance-Leahy Scale: Good     Standing balance support: No upper extremity supported Standing balance-Leahy Scale:  Fair Standing balance comment: able to stand without support and no loss of balance, unable to accept challenges                    Cognition Arousal/Alertness: Awake/alert Behavior During Therapy: WFL for tasks assessed/performed Overall Cognitive Status: Within Functional Limits for tasks assessed                      Exercises      General Comments        Pertinent Vitals/Pain Pain Assessment: No/denies pain Pain Score: 0-No pain    Home Living                      Prior Function            PT Goals (current goals can now be found in the care plan section) Progress towards PT goals: Goals met/education completed, patient discharged from PT (patient discharging today)    Frequency  Min 3X/week    PT Plan Current plan remains appropriate    Co-evaluation             End of Session   Activity Tolerance: Patient tolerated treatment well Patient left: with family/visitor present (on [facility])     Time: 1761-6073 PT Time Calculation (min) (ACUTE ONLY): 16 min  Charges:  $Gait Training: 8-22 mins                    G Codes:      Reather Littler  M 10/13/2015, 12:04 PM  10/13/2015 [place], [place]

## 2015-10-13 NOTE — Progress Notes (Signed)
Speech Language Pathology Treatment: Dysphagia  Patient Details Name: Hunter Carter MRN: [id number] DOB: Jun 07, 1951 Today's Date: 10/13/2015 Time: 1044-1100 SLP Time Calculation (min) (ACUTE ONLY): 16 min  Assessment / Plan / Recommendation Clinical Impression  Pt consumed Dysphagia 3/thin diet without overt s/s of aspiration, but one incidence of delayed throat clearing noted after increased volume of liquids, so minimal cueing for small sips given with compliance and pt in agreement that swallowing strategies are assisting him with overall po intake; pt's clarity improved this session and LOA increased significantly as well, which certainly impacts his overall swallowing function.  ST will s/o as pt is tolerating his current diet well and understands swallowing precaution importance; discussed with son also during session re: swallowing precautions and he stated he noticed improvement with mealtime and his swallowing function overall.   HPI HPI: 64 y.o. male with PMH of cirrhosis, CAD status post CABG 3, dCHF, HTN, HLD, secondary AV block, S/P of pacemaker placement, who presents with altered mental status, and the left ankle pain and swelling.      SLP Plan  All goals met     Recommendations  Diet recommendations: Dysphagia 3 (mechanical soft);Thin liquid Liquids provided via: Cup;Straw Medication Administration: Whole meds with liquid Supervision: Patient able to self feed Compensations: Slow rate;Small sips/bites Postural Changes and/or Swallow Maneuvers: Seated upright 90 degrees              Oral Care Recommendations: Oral care BID Follow up Recommendations: None Plan: All goals met   ADAMS,PAT, M.S., CCC-SLP 10/13/2015, 11:02 AM

## 2015-10-13 NOTE — Care Management Important Message (Signed)
Important Message  Patient Details  Name: Hunter Carter MRN: [id number] Date of Birth: January 19, 1951   Medicare Important Message Given:  Yes    Nathen May 10/13/2015, 12:36 PM

## 2015-10-14 ENCOUNTER — Telehealth: Payer: Self-pay | Admitting: Cardiovascular Disease

## 2015-10-14 NOTE — Telephone Encounter (Signed)
Call from [place] with [facility]. Pt was recently discharged (10/13/2015) from hospital and does not have a PCP and he needs orders to continue PT with pt.  Requested orders for once first week and twice a week for 4 weeks for strength, balance, gait, and endurance.  Shanon BrowZO:[id number]

## 2015-10-14 NOTE — Telephone Encounter (Signed)
Ok to order PT?

## 2015-10-18 NOTE — Telephone Encounter (Signed)
Verbal order given to [facility]

## 2015-10-20 ENCOUNTER — Telehealth: Payer: Self-pay | Admitting: *Deleted

## 2015-10-20 NOTE — Telephone Encounter (Signed)
Patient referred to [facility] by a hospitalist for for diagnosis of encephalopathy,hepatitis C and alcoholic cirrhosis --- asking if Dr. Loletha Grayer is the attending physician ------NO  ---- THEY WILL NEED TO CONTACT HIS PCP FAXED.

## 2015-10-28 ENCOUNTER — Telehealth: Payer: Self-pay | Admitting: *Deleted

## 2015-10-28 NOTE — Telephone Encounter (Signed)
Signed orders for evaluation initial and ongoing; assessment of home environment, fall prevention and caregiver/resource availability.  Order for folding wheeled walker for [place].

## 2015-11-11 ENCOUNTER — Other Ambulatory Visit: Payer: Self-pay | Admitting: Cardiovascular Disease

## 2015-11-11 NOTE — Telephone Encounter (Signed)
Rx request sent to pharmacy.  

## 2015-11-16 ENCOUNTER — Encounter: Payer: Self-pay | Admitting: Cardiovascular Disease

## 2015-11-16 ENCOUNTER — Ambulatory Visit (INDEPENDENT_AMBULATORY_CARE_PROVIDER_SITE_OTHER): Payer: Medicare Other | Admitting: Cardiovascular Disease

## 2015-11-16 VITALS — BP 90/64 | HR 68 | Ht 66.0 in | Wt 210.0 lb

## 2015-11-16 DIAGNOSIS — I5032 Chronic diastolic (congestive) heart failure: Secondary | ICD-10-CM | POA: Diagnosis not present

## 2015-11-16 DIAGNOSIS — K7682 Hepatic encephalopathy: Secondary | ICD-10-CM

## 2015-11-16 DIAGNOSIS — I10 Essential (primary) hypertension: Secondary | ICD-10-CM

## 2015-11-16 DIAGNOSIS — I25119 Atherosclerotic heart disease of native coronary artery with unspecified angina pectoris: Secondary | ICD-10-CM | POA: Diagnosis not present

## 2015-11-16 DIAGNOSIS — I480 Paroxysmal atrial fibrillation: Secondary | ICD-10-CM

## 2015-11-16 DIAGNOSIS — K7290 Hepatic failure, unspecified without coma: Secondary | ICD-10-CM

## 2015-11-16 DIAGNOSIS — E785 Hyperlipidemia, unspecified: Secondary | ICD-10-CM | POA: Diagnosis not present

## 2015-11-16 DIAGNOSIS — Z950 Presence of cardiac pacemaker: Secondary | ICD-10-CM

## 2015-11-16 MED ORDER — FUROSEMIDE 40 MG PO TABS: 40.0000 mg | ORAL_TABLET | ORAL | Status: DC | PRN

## 2015-11-16 NOTE — Patient Instructions (Signed)
Your physician has recommended you make the following change in your medication:   TAKE YOUR FUROSEMIDE AS NEEDED FOR SWELLING  YOU WILL BE SENT A TRANSMITTER FOR YOUR PACEMAKER TO [facility].  IT WILL COME WITH INSTRUCTIONS BUT IF YOU HAVE ANY QUESTIONS THERE IS A NUMBER FOR [employer] TO CALL.   Dr. Sallyanne Kuster recommends that you schedule a follow-up appointment in: 6-8 weeks

## 2015-11-16 NOTE — Progress Notes (Signed)
Patient ID: Hunter Carter, male   DOB: 08/18/1951, 65 y.o.   MRN: [id number]    Cardiology Office Note    Date:  11/17/2015   ID:  Hunter Carter, DOB 17-Apr-1951, MRN [id number]  PCP:  Elizabeth Palau, MD  Cardiologist:   Sanda Klein, MD   Chief Complaint  Patient presents with  . Follow-up    no chest pain, some shortness of breath, no swelling, no cramping, no dizziness or lightheadedness    History of Present Illness:  Hunter Carter is a 65 y.o. male with multiple cardiac problems, returning for his first appointment in over 2 years. His wife had lung cancer and eventually died after a protracted illness, treated at [facility]. During this time he really did not pay attention to his own health.  He is now roughly 12 years status post bypass surgery (LIMA to LAD, SVG to diagonal and jump graft from the SVG to the diagonal to anobtuse marginal) .  In May 2005 he presented with acute myocardial infarction and was found to have total occlusion of the vein graft to the upper twos marginal branch, treated medically. His last functional assessment was a nuclear stress test in 2010 that showed a moderate scar in the distribution of the right coronary artery without ischemia and with normal left ventricular systolic function.   He has not had recurrent angina pectoris. He is now performing water aerobics at the [facility] without any difficulty.  He received his pacemaker in 2005 for second degree AV block, had a generator change out in 2011 and the device has not been checked since 2015. It is a Medtronic Adapta dual-chamber device.  He has a [employer] home monitoring device, but has never used  Pacemaker interrogation today shows normal device function. Generator longevity is estimated at another 12 years. There is 37% atrial pacing and only 0.3% ventricular pacing. Lead parameters are in normal range area he has 1.3% atrial mode switch due to atrial fibrillation. He had a 63 hour  episode of persistent atrial fibrillation last October. Most recently he had 1 minute and 43 seconds of atrial fibrillation on January 30. During the longer episodes of atrial fibrillation ventricular rate control is adequate at 90-1 100 bpm. The shorter episodes may represent atrial flutter and occasionally have high ventricular rates , but only for very brief periods of time. A few episodes labeled HVR represent paroxysmal atrial tachycardia with 1:1 conduction.  Last November he was admitted with hematemesis and encephalopathy and had an actively bleeding gastric ulcer.  He required blood transfusion. He was again hospitalized in late December with hepatic encephalopathy secondary to cirrhosis,  likely exacerbated by dehydration and hypokalemia, improved with cessation of diuretics and lactulose. He has a history of hepatitis C and reports virological cure after treatment with Harvoni. He also has a history of alcohol abuse. His prothrombin time is elevated, average INR is around 1.8 and reportedly did not improve after he received vitamin K.  Moderate thrombocytopenia (40-50K). He also has moderate thrombocytopenia and hypoalbuminemia , mildly elevated bilirubin.  Upper endoscopy did not show esophageal varices but did demonstrate multiple small gastric antrum ulcers with suggestion of recent bleeding. At colonoscopy he had multiple polypectomies and nonbleeding diverticulosis was also seen.   Past Medical History  Diagnosis Date  . CAD (coronary artery disease)   . Inferior MI ([place]) 2005  . Heart block AV second degree     permanent pacemaker 02/2004  . Ventricular tachycardia ([place])  nonsustained  . Systemic hypertension   . Hyperlipidemia   . RBBB   . CHF (congestive heart failure) ([place])   . ETOH abuse   . Acute hepatic encephalopathy ([place]) 10/10/2015    [name] 10/10/2015  . Cirrhosis ([place])     due to HCV/notes 10/10/2015  . Presence of permanent cardiac pacemaker   . GERD  (gastroesophageal reflux disease)   . Hepatitis C   . Bleeding stomach ulcer     "recently" (10/10/2015)  . Arthritis     "joints ache" (10/10/2015)    Past Surgical History  Procedure Laterality Date  . Cardiac catheterization  03/06/04    significant 3 vessel disease, totalled graft obtuse marginal  . Knee arthroscopy Left [phone]  . US echocardiography  01/20/07    mild MR,mild to mod. TR,trace AI,mild PI  . Nm myocar perf wall motion  04/05/09    low risk scan-abnormal-mod. perfusion defect basal inferoseptal,basal inferior,mid inferoseptal,mid inferior and apical inferior regions  . Esophagogastroduodenoscopy N/A 07/29/2015    Procedure: ESOPHAGOGASTRODUODENOSCOPY (EGD);  Surgeon: Clarene Essex, MD;  Location: [facility] ENDOSCOPY;  Service: Endoscopy;  Laterality: N/A;  . Coronary artery bypass graft  02/10/03    LIMA to LAD,SVG to first diagonal, SVT to OM1  . Insert / replace / remove pacemaker  03/2010    Medtronic  . Total knee arthroplasty Left   . Mediastinal exploration  02/10/2003    Outpatient Prescriptions Prior to Visit  Medication Sig Dispense Refill  . folic acid (FOLVITE) 1 MG tablet Take 1 tablet (1 mg total) by mouth daily.    Marland Kitchen lactulose (CHRONULAC) 10 GM/15ML solution Take 45 mLs (30 g total) by mouth 3 (three) times daily. 946 mL 0  . Magnesium 500 MG CAPS Take 500 mg by mouth daily.    . metoprolol (LOPRESSOR) 50 MG tablet take 1 tablet by mouth twice a day (NEED OFFICE VISIT) 10 tablet 0  . potassium chloride SA (K-DUR,KLOR-CON) 20 MEQ tablet Take 20 mEq by mouth 2 (two) times daily.    . rifaximin (XIFAXAN) 550 MG TABS tablet Take 1 tablet (550 mg total) by mouth every 12 (twelve) hours. For 21 days (started 08/01/15)    . valsartan-hydrochlorothiazide (DIOVAN-HCT) 320-25 MG tablet Take 1 tablet by mouth daily. 30 tablet 0  . furosemide (LASIX) 40 MG tablet Take 1 tablet (40 mg total) by mouth daily. 30 tablet 1  . acetaminophen (TYLENOL) 325 MG tablet Take 2  tablets (650 mg total) by mouth every 6 (six) hours as needed for mild pain, moderate pain, fever or headache.    . diphenhydrAMINE (BENADRYL) 25 MG tablet Take 1 tablet (25 mg total) by mouth at bedtime as needed for sleep. 10 tablet 0  . erythromycin ophthalmic ointment Place 1 application into the left eye every 6 (six) hours. Place 1/2 inch ribbon of ointment in the affected eye 4 times a day 3.5 g 1  . pantoprazole (PROTONIX) 40 MG tablet Take 1 tablet (40 mg total) by mouth 2 (two) times daily before a meal.    . thiamine 100 MG tablet Take 1 tablet (100 mg total) by mouth daily. (Patient not taking: Reported on 10/10/2015)     No facility-administered medications prior to visit.     Allergies:   Review of patient's allergies indicates no known allergies.   Social History   Social History  . Marital Status: Widowed    Spouse Name: N/A  . Number of Children: N/A  . Years of  Education: N/A   Social History Main Topics  . Smoking status: Current Every Day Smoker -- 0.25 packs/day for 41 years    Types: Cigarettes  . Smokeless tobacco: Never Used  . Alcohol Use: Yes     Comment: 10/10/2015 "last drink was 2-3 months ago; h/o abuse"  . Drug Use: No  . Sexual Activity: Not Asked   Other Topics Concern  . None   Social History Narrative     Family History:  The patient's family history includes Heart failure in his mother and sister; Hypertension in his sister and sister; Liver disease in his father. There is no history of Colon cancer.   ROS:   Please see the history of present illness.    ROS All other systems reviewed and are negative.   PHYSICAL EXAM:   VS:  BP 90/64 mmHg  Pulse 68  Ht 5\' 6"  (1.676 m)  Wt 95.255 kg (210 lb)  BMI 33.91 kg/m2   GEN: Well nourished, well developed, in no acute distress HEENT: normal Neck: no JVD, carotid bruits, or masses Cardiac: RRR; no murmurs, rubs, or gallops,no edema  Respiratory:  clear to auscultation bilaterally, normal  work of breathing GI: soft, nontender, nondistended, + BS MS: no deformity or atrophy Skin: warm and dry, no rash Neuro:  Alert and Oriented x 3, Strength and sensation are intact Psych: euthymic mood, full affect  Wt Readings from Last 3 Encounters:  11/16/15 95.255 kg (210 lb)  10/10/15 92.3 kg (203 lb 7.8 oz)  10/08/15 89.812 kg (198 lb)      Studies/Labs Reviewed:   EKG:  EKG is not  ordered today.  The ekg ordered 10/10/2015 demonstrates  Sinus rhythm with PACs, old inferior infarction, old right bundle branch block.  It is similar to the tracing from 2014,  Actually does not show as significant lateral T wave inversion as past tracings  Recent Labs: 08/04/2015: Magnesium 1.5* 10/11/2015: ALT 23; B Natriuretic Peptide 55.2; Hemoglobin 11.6*; Platelets 49* 10/13/2015: BUN 10; Creatinine, Ser 0.77; Potassium 3.3*; Sodium 137   Lipid Panel    Component Value Date/Time   CHOL 111 10/11/2015 0138   TRIG 64 10/11/2015 0138   HDL 32* 10/11/2015 0138   CHOLHDL 3.5 10/11/2015 0138   VLDL 13 10/11/2015 0138   LDLCALC 66 10/11/2015 0138    Additional studies/ records that were reviewed today include:   records from hospitalization in December    ASSESSMENT:    1. Chronic diastolic (congestive) heart failure ([place])   2. Coronary artery disease involving native coronary artery of native heart with angina pectoris (HCC)   3. Paroxysmal atrial fibrillation ([place])   4. Essential hypertension   5. Hyperlipidemia   6. Pacemaker - dual chamber Medtronic 2011   7. Hepatic encephalopathy ([place])      PLAN:  In order of problems listed above:  1. CHF:  Clinically euvolemic, NYHA functional class I. Maybe mildly hypovolemic leading to low blood pressure. We'll stop daily furosemide. He should take it only as needed for worsening edema 2. CAD s/p CABG 2004:  He does not have angina pectoris.  Known occlusion of the vein graft bypass to the OM branch. Inferior scar without ischemia by  nuclear perfusion testing in 2010.  3. PAF:  Protracted asymptomatic AF with adequate rate control. CHADSVasc 4, but with cirrhosis, coagulopathy, thrombocytopenia and recent bleeding requiring transfusion, bleeding risk is prohibitive. Continue metoprolol. 4. HTN: currently low BP, likely due to excessive diuresis and lactulose  related diarrhea 5. HLP: lipid levels are low, likely due to hepatic insufficiency. Should not receive statins. 6. PPM:  This was implanted for second degree AV block not long after acute MI. He almost never requires ventricular pacing. AV conduction likely recovered after the infarction. Still useful as a rhythm monitor device. Will get a wireless Carelink adapter. 7. Cirrhosis: With portal HTN and parenchymal insufficiency. Reports virological cure of Hep C but prognosis is guarded.   Medication Adjustments/Labs and Tests Ordered: Current medicines are reviewed at length with the patient today.  Concerns regarding medicines are outlined above.  Medication changes, Labs and Tests ordered today are listed in the Patient Instructions below. Patient Instructions  Your physician has recommended you make the following change in your medication:   TAKE YOUR FUROSEMIDE AS NEEDED FOR SWELLING  YOU WILL BE SENT A TRANSMITTER FOR YOUR PACEMAKER TO DO CHECKS FROM HOME.  IT WILL COME WITH INSTRUCTIONS BUT IF YOU HAVE ANY QUESTIONS THERE IS A NUMBER FOR [employer] TO CALL.   Dr. Sallyanne Kuster recommends that you schedule a follow-up appointment in: 6-8 weeks      Signed, Sanda Klein, MD  11/17/2015 11:14 AM    [address], [place], [place]  [address] Phone: [phone]; Fax: [phone]

## 2015-11-28 ENCOUNTER — Ambulatory Visit
Admission: RE | Admit: 2015-11-28 | Discharge: 2015-11-28 | Disposition: A | Discharge status: Home / Self Care | Payer: Medicare Other | Source: Ambulatory Visit | Attending: Nurse Practitioner | Admitting: Nurse Practitioner

## 2015-11-28 ENCOUNTER — Telehealth: Payer: Self-pay | Admitting: Cardiovascular Disease

## 2015-11-28 DIAGNOSIS — C220 Liver cell carcinoma: Secondary | ICD-10-CM

## 2015-11-28 DIAGNOSIS — K7460 Unspecified cirrhosis of liver: Secondary | ICD-10-CM

## 2015-11-28 NOTE — Telephone Encounter (Signed)
Please give him contact info for [employer] and [facility]

## 2015-11-28 NOTE — Telephone Encounter (Signed)
Pt given contact information for [facility] and [facility]. Aware to check and see who is taking new patients. Aware to call us if further needs we can address.

## 2015-11-28 NOTE — Telephone Encounter (Signed)
New message   Pt is calling for the Dr.Croitoru to give him a referral to a PCP

## 2015-11-30 ENCOUNTER — Other Ambulatory Visit: Payer: Self-pay | Admitting: Cardiovascular Disease

## 2015-12-01 ENCOUNTER — Telehealth: Payer: Self-pay | Admitting: Cardiovascular Disease

## 2015-12-01 MED ORDER — METOPROLOL TARTRATE 50 MG PO TABS: 50.0000 mg | ORAL_TABLET | Freq: Two times a day (BID) | ORAL | Status: DC

## 2015-12-01 NOTE — Telephone Encounter (Signed)
New message   *STAT* If patient is at the pharmacy, call can be transferred to refill team.   1. Which medications need to be refilled? (please list name of each medication and dose if known)  metoprolol  2. Which pharmacy/location (including street and city if local pharmacy) is medication to be sent to? [facility] on [address]   3. Do they need a 30 day or 90 day supply? Received  A script for 10 tablets requests a call back to clarify if this is correct

## 2015-12-01 NOTE — Telephone Encounter (Signed)
SPOKE TO PHARMACIST  NEW PRESCRIPTION E -SENT TO PHARMACY

## 2015-12-01 NOTE — Telephone Encounter (Signed)
Rx request sent to pharmacy.  

## 2016-01-05 ENCOUNTER — Ambulatory Visit (INDEPENDENT_AMBULATORY_CARE_PROVIDER_SITE_OTHER): Payer: Medicare Other | Admitting: Cardiovascular Disease

## 2016-01-05 ENCOUNTER — Encounter: Payer: Self-pay | Admitting: Cardiovascular Disease

## 2016-01-05 VITALS — BP 129/89 | HR 87 | Ht 66.0 in | Wt 210.0 lb

## 2016-01-05 DIAGNOSIS — I480 Paroxysmal atrial fibrillation: Secondary | ICD-10-CM | POA: Diagnosis not present

## 2016-01-05 DIAGNOSIS — I5032 Chronic diastolic (congestive) heart failure: Secondary | ICD-10-CM

## 2016-01-05 DIAGNOSIS — I10 Essential (primary) hypertension: Secondary | ICD-10-CM | POA: Diagnosis not present

## 2016-01-05 DIAGNOSIS — E785 Hyperlipidemia, unspecified: Secondary | ICD-10-CM

## 2016-01-05 DIAGNOSIS — I25118 Atherosclerotic heart disease of native coronary artery with other forms of angina pectoris: Secondary | ICD-10-CM

## 2016-01-05 DIAGNOSIS — Z720 Tobacco use: Secondary | ICD-10-CM | POA: Insufficient documentation

## 2016-01-05 DIAGNOSIS — Z950 Presence of cardiac pacemaker: Secondary | ICD-10-CM

## 2016-01-05 DIAGNOSIS — E669 Obesity, unspecified: Secondary | ICD-10-CM

## 2016-01-05 NOTE — Patient Instructions (Signed)
Dr Sallyanne Kuster has recommended making the following medication changes: 1. ONLY TAKE Potassium as needed when you take Furosemide.  Remote monitoring is used to monitor your Pacemaker of ICD from home. This monitoring reduces the number of office visits required to check your device to one time per year. It allows [place] to keep an eye on the functioning of your device to ensure it is working properly. You are scheduled for a device check from home on Feb 14, 2016. You may send your transmission at any time that day. If you have a wireless device, the transmission will be sent automatically. After your physician reviews your transmission, you will receive a postcard with your next transmission date.  Dr Sallyanne Kuster recommends that you schedule a follow-up appointment in 6 months. You will receive a reminder letter in the mail two months in advance. If you don't receive a letter, please call our office to schedule the follow-up appointment.  If you need a refill on your cardiac medications before your next appointment, please call your pharmacy.2

## 2016-01-05 NOTE — Progress Notes (Signed)
Patient ID: Hunter Carter, male   DOB: 12-22-1950, 65 y.o.   MRN: [id number]    Cardiology Office Note    Date:  01/05/2016   ID:  Hunter Carter, DOB 10/17/1950, MRN [id number]  PCP:  Hunter Palau, MD  Cardiologist:   Hunter Klein, MD   Chief Complaint  Patient presents with  . Follow-up  . Dizziness  . Shortness of Breath    History of Present Illness:  Hunter Carter is a 65 y.o. male returning in f/u for hypotension after adjustment of meds several weeks ago.  E generally feels well. He felt occasionally dizzy when he was moving some heavy stereo equipment He takes furosemide only as needed for leg edema, on the average once a week. He is participating in water aerobics. He feels a little short of breath until he "warms up" after which he does not have any problems with dizziness, dyspnea or chest discomfort.  We performed a quick pacemaker interrogation today just to assess the burden of atrial fibrillation. Since his device check 1 month ago he has had only 2 very short episodes of atrial fibrillation. One lasted for 30 seconds, the other for under 5 minutes. The average ventricular rate during these episodes was 77-1 100 bpm.  roughly 12 years status post bypass surgery (LIMA to LAD, SVG to diagonal and jump graft from the SVG to the diagonal to anobtuse marginal). In May 2005 he presented with acute myocardial infarction and was found to have total occlusion of the vein graft to the obtuse marginal branch, treated medically. His last functional assessment was a nuclear stress test in 2010 that showed a moderate scar in the distribution of the right coronary artery without ischemia and with normal left ventricular systolic function. He received his pacemaker in 2005 for second degree AV block, had a generator change out in 2011 and the device has not been checked since 2015. It is a Medtronic Adapta dual-chamber device.  He has a history of hepatitis C and has  cirrhosis despite virologic cure with evidence of parenchymal insufficiency, coagulopathy, thrombocytopenia and previous hospitalization for GI bleeding and hepatic encephalopathy. Most recent bleeding event secondary to gastric ulcerwas November 2016 and lead to the need for blood transfusions.    Past Medical History  Diagnosis Date  . CAD (coronary artery disease)   . Inferior MI (Hunter Carter) 2005  . Heart block AV second degree     permanent pacemaker 02/2004  . Ventricular tachycardia (HCC)     nonsustained  . Systemic hypertension   . Hyperlipidemia   . RBBB   . CHF (congestive heart failure) ([place])   . ETOH abuse   . Acute hepatic encephalopathy (Hunter Carter) 10/10/2015    Hunter Carter 10/10/2015  . Cirrhosis ([place])     due to HCV/notes 10/10/2015  . Presence of permanent cardiac pacemaker   . GERD (gastroesophageal reflux disease)   . Hepatitis C   . Bleeding stomach ulcer     "recently" (10/10/2015)  . Arthritis     "joints ache" (10/10/2015)    Past Surgical History  Procedure Laterality Date  . Cardiac catheterization  03/06/04    significant 3 vessel disease, totalled graft obtuse marginal  . Knee arthroscopy Left [phone]  . US echocardiography  01/20/07    mild MR,mild to mod. TR,trace AI,mild PI  . Nm myocar perf wall motion  04/05/09    low risk scan-abnormal-mod. perfusion defect basal inferoseptal,basal inferior,mid inferoseptal,mid inferior and apical inferior regions  . Esophagogastroduodenoscopy N/A 07/29/2015  Procedure: ESOPHAGOGASTRODUODENOSCOPY (EGD);  Surgeon: Clarene Essex, MD;  Location: [facility] ENDOSCOPY;  Service: Endoscopy;  Laterality: N/A;  . Coronary artery bypass graft  02/10/03    LIMA to LAD,SVG to first diagonal, SVT to OM1  . Insert / replace / remove pacemaker  03/2010    Medtronic  . Total knee arthroplasty Left   . Mediastinal exploration  02/10/2003    Outpatient Prescriptions Prior to Visit  Medication Sig Dispense Refill  . folic acid (FOLVITE) 1 MG  tablet Take 1 tablet (1 mg total) by mouth daily.    . furosemide (LASIX) 40 MG tablet Take 1 tablet (40 mg total) by mouth as needed. 30 tablet 1  . metoprolol (LOPRESSOR) 50 MG tablet Take 1 tablet (50 mg total) by mouth 2 (two) times daily. 60 tablet 2  . potassium chloride SA (K-DUR,KLOR-CON) 20 MEQ tablet Take 20 mEq by mouth daily as needed.    . rifaximin (XIFAXAN) 550 MG TABS tablet Take 1 tablet (550 mg total) by mouth every 12 (twelve) hours. For 21 days (started 08/01/15)    . valsartan-hydrochlorothiazide (DIOVAN-HCT) 320-25 MG tablet Take 1 tablet by mouth daily. 30 tablet 0  . lactulose (CHRONULAC) 10 GM/15ML solution Take 45 mLs (30 g total) by mouth 3 (three) times daily. 946 mL 0  . Magnesium 500 MG CAPS Take 500 mg by mouth daily.     No facility-administered medications prior to visit.     Allergies:   Review of patient's allergies indicates no known allergies.   Social History   Social History  . Marital Status: Widowed    Spouse Name: N/A  . Number of Children: N/A  . Years of Education: N/A   Social History Main Topics  . Smoking status: Current Every Day Smoker -- 0.25 packs/day for 41 years    Types: Cigarettes  . Smokeless tobacco: Never Used  . Alcohol Use: Yes     Comment: 10/10/2015 "last drink was 2-3 months ago; h/o abuse"  . Drug Use: No  . Sexual Activity: Not Asked   Other Topics Concern  . None   Social History Narrative     Family History:  The patient's family history includes Heart failure in his mother and sister; Hypertension in his sister and sister; Liver disease in his father. There is no history of Colon cancer.   ROS:   Please see the history of present illness.    ROS All other systems reviewed and are negative.   PHYSICAL EXAM:   VS:  BP 129/89 mmHg  Pulse 87  Ht 5\' 6"  (1.676 m)  Wt 95.255 kg (210 lb)  BMI 33.91 kg/m2   GEN: Well nourished, well developed, in no acute distress  HEENT: normal  Neck: no JVD, carotid  bruits, or masses Cardiac: RRR; no murmurs, rubs, or gallops,no edema  Respiratory: clear to auscultation bilaterally, normal work of breathing GI: soft, nontender, nondistended, + BS MS: no deformity or atrophy  Skin: warm and dry, no rash Neuro: Alert and Oriented x 3, Strength and sensation are intact Psych: euthymic mood, full affectaffect  Wt Readings from Last 3 Encounters:  01/05/16 95.255 kg (210 lb)  11/16/15 95.255 kg (210 lb)  10/10/15 92.3 kg (203 lb 7.8 oz)      Studies/Labs Reviewed:   EKG:  EKG is not ordered today.   Recent Labs: 08/04/2015: Magnesium 1.5* 10/11/2015: ALT 23; B Natriuretic Peptide 55.2; Hemoglobin 11.6*; Platelets 49* 10/13/2015: BUN 10; Creatinine, Ser 0.77; Potassium 3.3*;  Sodium 137   Lipid Panel    Component Value Date/Time   CHOL 111 10/11/2015 0138   TRIG 64 10/11/2015 0138   HDL 32* 10/11/2015 0138   CHOLHDL 3.5 10/11/2015 0138   VLDL 13 10/11/2015 0138   LDLCALC 66 10/11/2015 0138    ASSESSMENT:    1. Chronic diastolic (congestive) heart failure ([place])   2. Coronary artery disease involving native coronary artery of native heart with other form of angina pectoris (HCC)   3. Paroxysmal atrial fibrillation ([place])   4. Essential hypertension   5. Hyperlipidemia   6. Pacemaker - dual chamber Medtronic 2011   7. Obesity (BMI 30-39.9)   8. Tobacco abuse      PLAN:  In order of problems listed above:  1. CHF: Clinically euvolemic, NYHA functional class I. Appears euvolemic on infrequent "as needed" furosemide. He should take KCl only when taking furosemide, not daily. 2. CAD s/p CABG 2004: He does not have angina pectoris. Known occlusion of the vein graft bypass to the OM branch. Inferior scar without ischemia by nuclear perfusion testing in 2010.  3. PAF: Protracted asymptomatic AF with adequate rate control. CHADSVasc 4, but with cirrhosis, coagulopathy, thrombocytopenia and recent GI bleeding requiring transfusion,  bleeding risk is prohibitive. Continue metoprolol. 4. HTN: good control 5. HLP: lipid levels are low, likely due to hepatic insufficiency. Should not receive statins. 6. PPM: This was implanted for second degree AV block not long after acute MI. He almost never requires ventricular pacing. AV conduction likely recovered after the infarction. Still useful as a rhythm monitor device. Will get a wireless Carelink adapter. 7. Obesity. 8. Strongly encouraged to quit smoking permanently.    Medication Adjustments/Labs and Tests Ordered: Current medicines are reviewed at length with the patient today.  Concerns regarding medicines are outlined above.  Medication changes, Labs and Tests ordered today are listed in the Patient Instructions below. Patient Instructions  Dr Sallyanne Kuster has recommended making the following medication changes: 1. ONLY TAKE Potassium as needed when you take Furosemide.  Remote monitoring is used to monitor your Pacemaker of ICD from home. This monitoring reduces the number of office visits required to check your device to one time per year. It allows [place] to keep an eye on the functioning of your device to ensure it is working properly. You are scheduled for a device check from home on Feb 14, 2016. You may send your transmission at any time that day. If you have a wireless device, the transmission will be sent automatically. After your physician reviews your transmission, you will receive a postcard with your next transmission date.  Dr Sallyanne Kuster recommends that you schedule a follow-up appointment in 6 months. You will receive a reminder letter in the mail two months in advance. If you don't receive a letter, please call our office to schedule the follow-up appointment.  If you need a refill on your cardiac medications before your next appointment, please call your pharmacy.2       Signed, Hunter Klein, MD  01/05/2016 1:28 PM    [facility] [address], [place], [place]  [address] Phone: [phone]; Fax: [phone]

## 2016-01-13 ENCOUNTER — Other Ambulatory Visit: Payer: Self-pay | Admitting: Cardiovascular Disease

## 2016-01-13 NOTE — Telephone Encounter (Signed)
Rx request sent to pharmacy.  

## 2016-01-19 ENCOUNTER — Other Ambulatory Visit: Payer: Self-pay | Admitting: Cardiovascular Disease

## 2016-01-19 NOTE — Telephone Encounter (Signed)
Rx request sent to pharmacy.  

## 2016-03-03 LAB — CUP PACEART INCLINIC DEVICE CHECK
Brady Statistic AP VS Percent: 43 %
Implantable Lead Implant Date: 20050527
Implantable Lead Implant Date: 20110614
Implantable Lead Location: 753860
Implantable Lead Location: 753860
Implantable Lead Model: 5076
Implantable Lead Model: 5076
Lead Channel Impedance Value: 444 Ohm
Lead Channel Impedance Value: 612 Ohm
Lead Channel Setting Pacing Amplitude: 1.875
Lead Channel Setting Pacing Amplitude: 2 V
Lead Channel Setting Pacing Pulse Width: 0.4 ms
MDC IDC LEAD IMPLANT DT: 20110614
MDC IDC LEAD IMPLANT DT: 20050527
MDC IDC LEAD LOCATION: 753859
MDC IDC LEAD LOCATION: 753859
MDC IDC MSMT BATTERY IMPEDANCE: 205 Ohm
MDC IDC MSMT BATTERY REMAINING LONGEVITY: 138 mo
MDC IDC MSMT BATTERY VOLTAGE: 2.78 V
MDC IDC SESS DTM: 20170323113712
MDC IDC SESS DTM: 20170520105310
MDC IDC SET LEADCHNL RA PACING AMPLITUDE: 1.875
MDC IDC SET LEADCHNL RV PACING AMPLITUDE: 2 V
MDC IDC SET LEADCHNL RV PACING PULSEWIDTH: 0.4 ms
MDC IDC SET LEADCHNL RV SENSING SENSITIVITY: 4 mV
MDC IDC SET LEADCHNL RV SENSING SENSITIVITY: 4 mV
MDC IDC STAT BRADY AP VP PERCENT: 0 %
MDC IDC STAT BRADY AS VP PERCENT: 0 %
MDC IDC STAT BRADY AS VS PERCENT: 56 %

## 2016-03-09 ENCOUNTER — Other Ambulatory Visit: Payer: Self-pay | Admitting: Cardiovascular Disease

## 2016-03-09 MED ORDER — VALSARTAN-HYDROCHLOROTHIAZIDE 320-25 MG PO TABS: 1.0000 | ORAL_TABLET | Freq: Every day | ORAL | Status: DC

## 2016-03-09 NOTE — Telephone Encounter (Signed)
Rx(s) sent to pharmacy electronically.  

## 2016-04-09 ENCOUNTER — Encounter: Payer: Medicare Other | Admitting: *Deleted

## 2016-04-09 ENCOUNTER — Telehealth: Payer: Self-pay | Admitting: Cardiology

## 2016-04-09 NOTE — Telephone Encounter (Signed)
Spoke with pt and reminded pt of remote transmission that is due today. Pt verbalized understanding.   

## 2016-04-13 ENCOUNTER — Encounter: Payer: Self-pay | Admitting: Cardiology

## 2016-04-24 ENCOUNTER — Telehealth: Payer: Self-pay | Admitting: Cardiovascular Disease

## 2016-04-24 NOTE — Telephone Encounter (Signed)
New Message   Pt call requesting to speak with RN about transmission being received. Please call back to discuss

## 2016-04-24 NOTE — Telephone Encounter (Signed)
Returning call. Patient is not home and does not have monitor to confirm correct serial number. We have not received a transmission using his home monitor. He reports he talked to tech services yesterday and they ensure that he is doing everything correctly. Patient will call back when he is home and can troubleshoot his monitor.

## 2016-05-25 ENCOUNTER — Telehealth: Payer: Self-pay | Admitting: Cardiovascular Disease

## 2016-05-25 MED ORDER — SIMVASTATIN 20 MG PO TABS: 20.0000 mg | ORAL_TABLET | Freq: Every day | ORAL | 0 refills | Status: DC

## 2016-05-25 MED ORDER — FUROSEMIDE 40 MG PO TABS: 40.0000 mg | ORAL_TABLET | ORAL | 0 refills | Status: DC | PRN

## 2016-05-25 NOTE — Telephone Encounter (Signed)
Rx sent to pharmacy   

## 2016-05-25 NOTE — Telephone Encounter (Signed)
New message     Pt would like to know the side effects of Hctz 320 25mg . He wants to know if it has an effect on his driving. He is beginning to feel jittery when he is driving. Please call.     *STAT* If patient is at the pharmacy, call can be transferred to refill team.   1. Which medications need to be refilled? (please list name of each medication and dose if known)  Furosemide 40mg , simvastatin 20mg   2. Which pharmacy/location (including street and city if local pharmacy) is medication to be sent to? [facility] on [facility].   3. Do they need a 30 day or 90 day supply? Simvastatin 90 day supply

## 2016-05-25 NOTE — Telephone Encounter (Signed)
Called patient concerning his complaint. No answer. Goes to [facility] w/ no way to leave voice message. I was able to leave an SMS notification w/ our call back number.  Rx refill sent to pharmacy.

## 2016-05-28 ENCOUNTER — Other Ambulatory Visit: Payer: Self-pay | Admitting: Nurse Practitioner

## 2016-05-28 DIAGNOSIS — K7469 Other cirrhosis of liver: Secondary | ICD-10-CM

## 2016-07-17 ENCOUNTER — Ambulatory Visit
Admission: RE | Admit: 2016-07-17 | Discharge: 2016-07-17 | Disposition: A | Discharge status: Home / Self Care | Payer: Medicare Other | Source: Ambulatory Visit | Attending: Nurse Practitioner | Admitting: Nurse Practitioner

## 2016-07-17 DIAGNOSIS — K7469 Other cirrhosis of liver: Secondary | ICD-10-CM

## 2016-07-24 ENCOUNTER — Encounter: Payer: Medicare Other | Admitting: Cardiovascular Disease

## 2016-08-07 ENCOUNTER — Ambulatory Visit (INDEPENDENT_AMBULATORY_CARE_PROVIDER_SITE_OTHER): Payer: Medicare Other | Admitting: Cardiovascular Disease

## 2016-08-07 ENCOUNTER — Encounter: Payer: Self-pay | Admitting: Cardiovascular Disease

## 2016-08-07 VITALS — BP 100/63 | HR 74 | Ht 66.0 in | Wt 220.0 lb

## 2016-08-07 DIAGNOSIS — Z950 Presence of cardiac pacemaker: Secondary | ICD-10-CM | POA: Diagnosis not present

## 2016-08-07 DIAGNOSIS — I472 Ventricular tachycardia: Secondary | ICD-10-CM

## 2016-08-07 DIAGNOSIS — I480 Paroxysmal atrial fibrillation: Secondary | ICD-10-CM | POA: Diagnosis not present

## 2016-08-07 DIAGNOSIS — E7849 Other hyperlipidemia: Secondary | ICD-10-CM

## 2016-08-07 DIAGNOSIS — I5032 Chronic diastolic (congestive) heart failure: Secondary | ICD-10-CM

## 2016-08-07 DIAGNOSIS — E669 Obesity, unspecified: Secondary | ICD-10-CM

## 2016-08-07 DIAGNOSIS — K7469 Other cirrhosis of liver: Secondary | ICD-10-CM

## 2016-08-07 DIAGNOSIS — E784 Other hyperlipidemia: Secondary | ICD-10-CM

## 2016-08-07 DIAGNOSIS — Z720 Tobacco use: Secondary | ICD-10-CM

## 2016-08-07 DIAGNOSIS — I25118 Atherosclerotic heart disease of native coronary artery with other forms of angina pectoris: Secondary | ICD-10-CM

## 2016-08-07 DIAGNOSIS — I10 Essential (primary) hypertension: Secondary | ICD-10-CM

## 2016-08-07 MED ORDER — VALSARTAN-HYDROCHLOROTHIAZIDE 320-12.5 MG PO TABS: 1.0000 | ORAL_TABLET | Freq: Every day | ORAL | 11 refills | Status: DC

## 2016-08-07 NOTE — Progress Notes (Signed)
Patient ID: Hunter Carter, male   DOB: 1950-11-10, 64 y.o.   MRN: [id number]    Cardiology Office Note    Date:  08/08/2016   ID:  Hunter Carter, DOB October 14, 1951, MRN [id number]  PCP:  Elizabeth Palau, MD (Inactive)  Cardiologist:   Sanda Klein, MD   Chief Complaint  Patient presents with  . Follow-up    History of Present Illness:  Hunter Carter is a 65 y.o. male returning in f/u for hypotension after adjustment of CAD, paroxysmal atrial fibrillation, second degree AV block, pacemaker check.  Hunter Carter generally feels well. He denies palpitations, dizziness or syncope. Denies edema and takes furosemide on the average only once a month for edema. He does frequently feel tired. I don't think he's had any overt encephalopathy. He does not complain of shortness of breath or chest pain with usual activity or when he goes for water aerobics. He avoids other exercise due to joint problems. He has not had any focal neurological events or other signs of embolic phenomena. He has not had any overt bleeding episodes either.  Pacemaker interrogation shows normal device function. Presenting rhythm was atrial paced ventricular sensed. He has atrial pacing 53% of the time and only has ventricular pacing 0.6% of the time. Current generator longevity is estimated at 11 years and his 2011 Medtronic Adapta device is functioning normally. Lead parameters are good. His device has recorded some atrial fibrillation with an overall burden of 0.4%, primarily represented by a single 6 hour episode that occurred in August. Ventricular rate control is adequate with rates in the 70s-90s.  He admits to smoking an occasional cigarette and having "a couple of beers". He tells me he does this because he is bored.  He is status post bypass surgery (LIMA to LAD, SVG to diagonal and jump graft from the SVG to the diagonal to anobtuse marginal). In May 2005 he presented with acute myocardial infarction and  was found to have total occlusion of the vein graft to the obtuse marginal branch, treated medically. His last functional assessment was a nuclear stress test in 2010 that showed a moderate scar in the distribution of the right coronary artery without ischemia and with normal left ventricular systolic function. He received his pacemaker in 2005 for second degree AV block, had a generator change out in 2011 (Medtronic Adapta dual-chamber device).  He has a history of hepatitis C and has cirrhosis despite virologic cure with evidence of parenchymal insufficiency, coagulopathy, thrombocytopenia and previous hospitalization for GI bleeding and hepatic encephalopathy. Most recent bleeding event secondary to gastric ulcer was November 2016 and led to the need for blood transfusions.    Past Medical History:  Diagnosis Date  . Acute hepatic encephalopathy 10/10/2015   [name] 10/10/2015  . Arthritis    "joints ache" (10/10/2015)  . Bleeding stomach ulcer    "recently" (10/10/2015)  . CAD (coronary artery disease)   . CHF (congestive heart failure) ([place])   . Cirrhosis ([place])    due to HCV/notes 10/10/2015  . ETOH abuse   . GERD (gastroesophageal reflux disease)   . Heart block AV second degree    permanent pacemaker 02/2004  . Hepatitis C   . Hyperlipidemia   . Inferior MI ([place]) 2005  . Presence of permanent cardiac pacemaker   . RBBB   . Systemic hypertension   . Ventricular tachycardia (HCC)    nonsustained    Past Surgical History:  Procedure Laterality Date  . CARDIAC CATHETERIZATION  03/06/04  significant 3 vessel disease, totalled graft obtuse marginal  . CORONARY ARTERY BYPASS GRAFT  02/10/03   LIMA to LAD,SVG to first diagonal, SVT to OM1  . ESOPHAGOGASTRODUODENOSCOPY N/A 07/29/2015   Procedure: ESOPHAGOGASTRODUODENOSCOPY (EGD);  Surgeon: Clarene Essex, MD;  Location: [facility] ENDOSCOPY;  Service: Endoscopy;  Laterality: N/A;  . INSERT / REPLACE / REMOVE PACEMAKER  03/2010   Medtronic  .  KNEE ARTHROSCOPY Left [phone]  . MEDIASTINAL EXPLORATION  02/10/2003  . NM MYOCAR PERF WALL MOTION  04/05/09   low risk scan-abnormal-mod. perfusion defect basal inferoseptal,basal inferior,mid inferoseptal,mid inferior and apical inferior regions  . TOTAL KNEE ARTHROPLASTY Left   . US ECHOCARDIOGRAPHY  01/20/07   mild MR,mild to mod. TR,trace AI,mild PI    Outpatient Medications Prior to Visit  Medication Sig Dispense Refill  . furosemide (LASIX) 40 MG tablet Take 1 tablet (40 mg total) by mouth as needed. Please make appointment. 90 tablet 0  . metoprolol (LOPRESSOR) 50 MG tablet Take 1 tablet (50 mg total) by mouth 2 (two) times daily. 60 tablet 2  . potassium chloride SA (K-DUR,KLOR-CON) 20 MEQ tablet Take 20 mEq by mouth daily as needed.    . simvastatin (ZOCOR) 20 MG tablet Take 1 tablet (20 mg total) by mouth daily. Please make appointment. 90 tablet 0  . valsartan-hydrochlorothiazide (DIOVAN-HCT) 320-25 MG tablet Take 1 tablet by mouth daily. 90 tablet 1  . rifaximin (XIFAXAN) 550 MG TABS tablet Take 1 tablet (550 mg total) by mouth every 12 (twelve) hours. For 21 days (started 08/01/15)    . folic acid (FOLVITE) 1 MG tablet Take 1 tablet (1 mg total) by mouth daily. (Patient not taking: Reported on 08/07/2016)     No facility-administered medications prior to visit.      Allergies:   Review of patient's allergies indicates no known allergies.   Social History   Social History  . Marital status: Widowed    Spouse name: N/A  . Number of children: N/A  . Years of education: N/A   Social History Main Topics  . Smoking status: Current Every Day Smoker    Packs/day: 0.25    Years: 41.00    Types: Cigarettes  . Smokeless tobacco: Never Used  . Alcohol use Yes     Comment: 10/10/2015 "last drink was 2-3 months ago; h/o abuse"  . Drug use: No  . Sexual activity: Not Asked   Other Topics Concern  . None   Social History Narrative  . None     Family History:  The  patient's family history includes Heart failure in his mother and sister; Hypertension in his sister and sister; Liver disease in his father.   ROS:   Please see the history of present illness.    ROS All other systems reviewed and are negative.   PHYSICAL EXAM:   VS:  BP 100/63 (BP Location: Left Arm, Patient Position: Sitting, Cuff Size: Large)   Pulse 74   Ht 5\' 6"  (1.676 m)   Wt 220 lb (99.8 kg)   SpO2 98%   BMI 35.51 kg/m    GEN: Well nourished, well developed, in no acute distress  HEENT: normal  Neck: no JVD, carotid bruits, or masses Cardiac: RRR; no murmurs, rubs, or gallops,no edema  Respiratory: clear to auscultation bilaterally, normal work of breathing GI: soft, nontender, nondistended, + BS MS: no deformity or atrophy  Skin: warm and dry, no rash Neuro: Alert and Oriented x 3, Strength and sensation are intact Psych:  euthymic mood, full affectaffect  Wt Readings from Last 3 Encounters:  08/07/16 220 lb (99.8 kg)  01/05/16 210 lb (95.3 kg)  11/16/15 210 lb (95.3 kg)      Studies/Labs Reviewed:   EKG:  EKG is ordered today. It shows atrial paced ventricular sensed rhythm with bifascicular block (right bundle branch block and left anterior fascicular block) without acute repolarization abnormalities  Recent Labs: 10/11/2015: ALT 23; B Natriuretic Peptide 55.2; Hemoglobin 11.6; Platelets 49 10/13/2015: BUN 10; Creatinine, Ser 0.77; Potassium 3.3; Sodium 137   Lipid Panel    Component Value Date/Time   CHOL 111 10/11/2015 0138   TRIG 64 10/11/2015 0138   HDL 32 (L) 10/11/2015 0138   CHOLHDL 3.5 10/11/2015 0138   VLDL 13 10/11/2015 0138   LDLCALC 66 10/11/2015 0138    ASSESSMENT:    1. Chronic diastolic congestive heart failure ([place])   2. Coronary artery disease involving native coronary artery of native heart with other form of angina pectoris (HCC)   3. Paroxysmal atrial fibrillation ([place])   4. Essential hypertension   5. Other hyperlipidemia     6. Pacemaker   7. Obesity (BMI 30-39.9)   8. Tobacco abuse   9. Other cirrhosis of liver ([place])      PLAN:  In order of problems listed above:  1. CHF: Clinically euvolemic, NYHA functional class I. Appears euvolemic on infrequent "as needed" furosemide. He should take KCl only when taking furosemide, not daily. His blood pressure is relatively low. We'll decrease the dose of hydrochlorothiazide. 2. CAD s/p CABG 2004: He does not have angina pectoris. Known occlusion of the vein graft bypass to the OM branch. Inferior scar without ischemia by nuclear perfusion testing in 2010.  3. PAF: Protracted asymptomatic AF with adequate rate control. CHADSVasc 3 (HF, CAD, HTN), but with cirrhosis, coagulopathy, thrombocytopenia and recent GI bleeding requiring transfusion, bleeding risk is prohibitive. Continue metoprolol. 4. HTN: good, maybe excessively good control. Decrease hydrochlorothiazide and consider stopping it at future appointment. 5. Low HDL: lipid levels are low, likely due to hepatic insufficiency. Should not receive statins. 6. PPM: This was implanted for second degree AV block not long after acute MI. He almost never requires ventricular pacing. AV conduction likely recovered after the infarction. Still useful as a rhythm monitor device. He does have atrial pacing and roughly 50% of the time, maybe due to beta blocker therapy. He now has a Location manager and will do a download in 3 months. 7. Obesity: Weight loss encouraged. I don't see signs of excessive fluid on physical exam. 8. Strongly encouraged to quit smoking permanently and not restart it, even intermittently. 9. Cirrhosis: Carefully counseled him towards complete abstinence from alcohol due to the serious degree of liver abnormalities.    Medication Adjustments/Labs and Tests Ordered: Current medicines are reviewed at length with the patient today.  Concerns regarding medicines are outlined above.  Medication  changes, Labs and Tests ordered today are listed in the Patient Instructions below. Patient Instructions  Dr Sallyanne Kuster has recommended making the following medication changes: 1. DECREASE Valsartan-HCT to 320-12.5mg   Remote monitoring is used to monitor your Pacemaker of ICD from home. This monitoring reduces the number of office visits required to check your device to one time per year. It allows [place] to keep an eye on the functioning of your device to ensure it is working properly. You are scheduled for a device check from home on Tuesday, January 23rd, 2018. You may send your transmission  at any time that day. If you have a wireless device, the transmission will be sent automatically. After your physician reviews your transmission, you will receive a postcard with your next transmission date.  Dr Sallyanne Kuster recommends that you schedule a follow-up appointment in 6 months with a device check. You will receive a reminder letter in the mail two months in advance. If you don't receive a letter, please call our office to schedule the follow-up appointment.  If you need a refill on your cardiac medications before your next appointment, please call your pharmacy.      Signed, Sanda Klein, MD  08/08/2016 1:23 PM    [facility] [address], [place], [place]  [address] Phone: [phone]; Fax: [phone]

## 2016-08-07 NOTE — Patient Instructions (Signed)
Dr Sallyanne Kuster has recommended making the following medication changes: 1. DECREASE Valsartan-HCT to 320-12.5mg   Remote monitoring is used to monitor your Pacemaker of ICD from home. This monitoring reduces the number of office visits required to check your device to one time per year. It allows [place] to keep an eye on the functioning of your device to ensure it is working properly. You are scheduled for a device check from home on Tuesday, January 23rd, 2018. You may send your transmission at any time that day. If you have a wireless device, the transmission will be sent automatically. After your physician reviews your transmission, you will receive a postcard with your next transmission date.  Dr Sallyanne Kuster recommends that you schedule a follow-up appointment in 6 months with a device check. You will receive a reminder letter in the mail two months in advance. If you don't receive a letter, please call our office to schedule the follow-up appointment.  If you need a refill on your cardiac medications before your next appointment, please call your pharmacy.

## 2016-08-08 DIAGNOSIS — K7460 Unspecified cirrhosis of liver: Secondary | ICD-10-CM | POA: Insufficient documentation

## 2016-09-18 ENCOUNTER — Emergency Department (HOSPITAL_COMMUNITY)
Admission: EM | Admit: 2016-09-18 | Discharge: 2016-09-19 | Disposition: A | Discharge status: Home / Self Care | Payer: Medicare Other | Attending: Emergency Medicine | Admitting: Emergency Medicine

## 2016-09-18 ENCOUNTER — Encounter (HOSPITAL_COMMUNITY): Payer: Self-pay | Admitting: Emergency Medicine

## 2016-09-18 DIAGNOSIS — J209 Acute bronchitis, unspecified: Secondary | ICD-10-CM | POA: Diagnosis not present

## 2016-09-18 DIAGNOSIS — Z79899 Other long term (current) drug therapy: Secondary | ICD-10-CM | POA: Diagnosis not present

## 2016-09-18 DIAGNOSIS — Z950 Presence of cardiac pacemaker: Secondary | ICD-10-CM | POA: Insufficient documentation

## 2016-09-18 DIAGNOSIS — Z96652 Presence of left artificial knee joint: Secondary | ICD-10-CM | POA: Insufficient documentation

## 2016-09-18 DIAGNOSIS — F17210 Nicotine dependence, cigarettes, uncomplicated: Secondary | ICD-10-CM | POA: Diagnosis not present

## 2016-09-18 DIAGNOSIS — I110 Hypertensive heart disease with heart failure: Secondary | ICD-10-CM | POA: Insufficient documentation

## 2016-09-18 DIAGNOSIS — I2510 Atherosclerotic heart disease of native coronary artery without angina pectoris: Secondary | ICD-10-CM | POA: Insufficient documentation

## 2016-09-18 DIAGNOSIS — I252 Old myocardial infarction: Secondary | ICD-10-CM | POA: Diagnosis not present

## 2016-09-18 DIAGNOSIS — R05 Cough: Secondary | ICD-10-CM | POA: Diagnosis present

## 2016-09-18 DIAGNOSIS — Z951 Presence of aortocoronary bypass graft: Secondary | ICD-10-CM | POA: Diagnosis not present

## 2016-09-18 DIAGNOSIS — I5032 Chronic diastolic (congestive) heart failure: Secondary | ICD-10-CM | POA: Insufficient documentation

## 2016-09-18 LAB — URINALYSIS, ROUTINE W REFLEX MICROSCOPIC
Bacteria, UA: NONE SEEN
Bilirubin Urine: NEGATIVE
Glucose, UA: NEGATIVE mg/dL
Ketones, ur: NEGATIVE mg/dL
Nitrite: NEGATIVE
Protein, ur: NEGATIVE mg/dL
SPECIFIC GRAVITY, URINE: 1.013 (ref 1.005–1.030)
pH: 7 (ref 5.0–8.0)

## 2016-09-18 LAB — COMPREHENSIVE METABOLIC PANEL
ALT: 22 U/L (ref 17–63)
AST: 42 U/L — AB (ref 15–41)
Albumin: 2.4 g/dL — ABNORMAL LOW (ref 3.5–5.0)
Alkaline Phosphatase: 102 U/L (ref 38–126)
Anion gap: 7 (ref 5–15)
CHLORIDE: 106 mmol/L (ref 101–111)
CO2: 22 mmol/L (ref 22–32)
CREATININE: 0.83 mg/dL (ref 0.61–1.24)
Calcium: 8.4 mg/dL — ABNORMAL LOW (ref 8.9–10.3)
GFR calc Af Amer: >60 mL/min (ref >60)
Glucose, Bld: 121 mg/dL — ABNORMAL HIGH (ref 65–99)
Potassium: 4.2 mmol/L (ref 3.5–5.1)
Sodium: 135 mmol/L (ref 135–145)
Total Bilirubin: 1.7 mg/dL — ABNORMAL HIGH (ref 0.3–1.2)
Total Protein: 7.1 g/dL (ref 6.5–8.1)

## 2016-09-18 LAB — CBC
HCT: 42.8 % (ref 39.0–52.0)
Hemoglobin: 14.9 g/dL (ref 13.0–17.0)
MCH: 31.9 pg (ref 26.0–34.0)
MCHC: 34.8 g/dL (ref 30.0–36.0)
MCV: 91.6 fL (ref 78.0–100.0)
PLATELETS: 61 10*3/uL — AB (ref 150–400)
RBC: 4.67 MIL/uL (ref 4.22–5.81)
RDW: 14.5 % (ref 11.5–15.5)
WBC: 4.3 10*3/uL (ref 4.0–10.5)

## 2016-09-18 LAB — LIPASE, BLOOD: LIPASE: 20 U/L (ref 11–51)

## 2016-09-18 NOTE — ED Triage Notes (Signed)
Pt presents to ED for assessment of "a cold".  Pt sts it started approx 1 week ago, states some coughing with it.  Pt also c/o lower abdominal pain, intermittently, with some nausea.  Pt denies vomiting or diarrhea.  Pt sts he has had generalized body aches as well as fluctuations in temperature at home.

## 2016-09-19 ENCOUNTER — Emergency Department (HOSPITAL_COMMUNITY): Payer: Medicare Other

## 2016-09-19 MED ORDER — PREDNISONE 50 MG PO TABS: 50.0000 mg | ORAL_TABLET | Freq: Every day | ORAL | 0 refills | Status: DC

## 2016-09-19 MED ORDER — IPRATROPIUM-ALBUTEROL 0.5-2.5 (3) MG/3ML IN SOLN
3.0000 mL | Freq: Once | RESPIRATORY_TRACT | Status: AC
  Administered 2016-09-19: 3 mL via RESPIRATORY_TRACT
  Filled 2016-09-19: qty 3

## 2016-09-19 MED ORDER — PREDNISONE 20 MG PO TABS
60.0000 mg | ORAL_TABLET | Freq: Once | ORAL | Status: AC
  Administered 2016-09-19: 60 mg via ORAL
  Filled 2016-09-19: qty 3

## 2016-09-19 MED ORDER — ALBUTEROL SULFATE HFA 108 (90 BASE) MCG/ACT IN AERS: 2.0000 | INHALATION_SPRAY | RESPIRATORY_TRACT | 0 refills | Status: DC | PRN

## 2016-09-19 NOTE — ED Notes (Addendum)
Patient transported to X-ray 

## 2016-09-19 NOTE — ED Notes (Signed)
Pt c/o intermittent dizziness and shaking. C/o stomach cramping.

## 2016-09-19 NOTE — ED Provider Notes (Signed)
[facility] DEPT Provider Note   CSN: [id number] Arrival date & time: 09/18/16  2030  By signing my name below, I, Emmanuella Mensah, attest that this documentation has been prepared under the direction and in the presence of Delora Fuel, MD. Electronically Signed: Judithann Sauger, ED Scribe. 09/19/16. 12:41 AM.   History   Chief Complaint Chief Complaint  Patient presents with  . URI  . Abdominal Pain  . Nausea    HPI Comments: Hunter Carter is a 65 y.o. male with a hx of hypertension, cirrhosis, CAD s/p CABG (2004), CHF, and GERD who presents to the Emergency Department complaining of gradually worsening persistent moderate productive cough with white to yellow sputum onset 2 weeks ago. He reports associated subjective fever, intermittent wheezing, nasal congestion with clear rhinorrhea, and back pain. He adds that he is also experiencing lower abdominal pain he describes as tightness. No alleviating factors noted. He states that he has tried lemon tea and OTC cough syrup with no relief. Pt has NKDA. He is a current smoker. He denies any nausea, vomiting, diarrhea, chest pain, or any other symptoms.   The history is provided by the patient. No language interpreter was used.  URI   This is a new problem. The current episode started more than 1 week ago. The problem has been gradually worsening. There has been no fever (subjective). Associated symptoms include abdominal pain, congestion, rhinorrhea, cough and wheezing. Pertinent negatives include no chest pain, no nausea and no vomiting. He has tried other medications for the symptoms. The treatment provided no relief.    Past Medical History:  Diagnosis Date  . Acute hepatic encephalopathy 10/10/2015   [name] 10/10/2015  . Arthritis    "joints ache" (10/10/2015)  . Bleeding stomach ulcer    "recently" (10/10/2015)  . CAD (coronary artery disease)   . CHF (congestive heart failure) ([place])   . Cirrhosis ([place])    due to  HCV/notes 10/10/2015  . ETOH abuse   . GERD (gastroesophageal reflux disease)   . Heart block AV second degree    permanent pacemaker 02/2004  . Hepatitis C   . Hyperlipidemia   . Inferior MI ([place]) 2005  . Presence of permanent cardiac pacemaker   . RBBB   . Systemic hypertension   . Ventricular tachycardia ([place])    nonsustained    Patient Active Problem List   Diagnosis Date Noted  . Cirrhosis ([place]) 08/08/2016  . Paroxysmal atrial fibrillation ([place]) 01/05/2016  . Tobacco abuse 01/05/2016  . Hepatic encephalopathy ([place]) 10/10/2015  . Pain, joint, ankle, left 10/10/2015  . Thrombocytopathia ([place]) 10/10/2015  . HLD (hyperlipidemia) 10/10/2015  . Chronic diastolic congestive heart failure ([place]) 10/10/2015  . Altered mental status   . Increased ammonia level   . Leg swelling   . Encephalopathy acute 08/01/2015  . GI bleed 07/29/2015  . Acute GI bleeding 07/29/2015  . Acute respiratory failure ([place])   . Bleeding gastrointestinal   . Hemorrhagic shock   . CAD s/p CABG 2004 07/20/2013  . Pacemaker - dual chamber Medtronic 2011 07/20/2013  . Hepatitis C 07/20/2013  . Hyperlipidemia 07/20/2013  . PAT (paroxysmal atrial tachycardia) ([place]) 07/20/2013  . Obesity (BMI 30-39.9) 07/20/2013  . NSVT (nonsustained ventricular tachycardia) ([place]) 07/20/2013  . HTN (hypertension) 07/16/2013    Past Surgical History:  Procedure Laterality Date  . CARDIAC CATHETERIZATION  03/06/04   significant 3 vessel disease, totalled graft obtuse marginal  . CORONARY ARTERY BYPASS GRAFT  02/10/03   LIMA to  LAD,SVG to first diagonal, SVT to OM1  . ESOPHAGOGASTRODUODENOSCOPY N/A 07/29/2015   Procedure: ESOPHAGOGASTRODUODENOSCOPY (EGD);  Surgeon: Clarene Essex, MD;  Location: [facility] ENDOSCOPY;  Service: Endoscopy;  Laterality: N/A;  . INSERT / REPLACE / REMOVE PACEMAKER  03/2010   Medtronic  . KNEE ARTHROSCOPY Left [phone]  . MEDIASTINAL EXPLORATION  02/10/2003  . NM MYOCAR PERF WALL MOTION  04/05/09    low risk scan-abnormal-mod. perfusion defect basal inferoseptal,basal inferior,mid inferoseptal,mid inferior and apical inferior regions  . TOTAL KNEE ARTHROPLASTY Left   . US ECHOCARDIOGRAPHY  01/20/07   mild MR,mild to mod. TR,trace AI,mild PI       Home Medications    Prior to Admission medications   Medication Sig Start Date End Date Taking? Authorizing Provider  Cyanocobalamin (B-12 SUPER STRENGTH SL) Place under the tongue.    Historical Provider, MD  furosemide (LASIX) 40 MG tablet Take 1 tablet (40 mg total) by mouth as needed. Please make appointment. 05/25/16   Mihai Croitoru, MD  metoprolol (LOPRESSOR) 50 MG tablet Take 1 tablet (50 mg total) by mouth 2 (two) times daily. 12/01/15   Mihai Croitoru, MD  NONFORMULARY OR COMPOUNDED ITEM Take 450 mg by mouth daily. [place] herbal extract    Historical Provider, MD  potassium chloride SA (K-DUR,KLOR-CON) 20 MEQ tablet Take 20 mEq by mouth daily as needed.    Historical Provider, MD  rifaximin (XIFAXAN) 550 MG TABS tablet Take 1 tablet (550 mg total) by mouth every 12 (twelve) hours. For 21 days (started 08/01/15) 08/04/15   Modena Jansky, MD  simvastatin (ZOCOR) 20 MG tablet Take 1 tablet (20 mg total) by mouth daily. Please make appointment. 05/25/16   Mihai Croitoru, MD  valsartan-hydrochlorothiazide (DIOVAN-HCT) 320-12.5 MG tablet Take 1 tablet by mouth daily. 08/07/16   Mihai Croitoru, MD  vitamin E 100 UNIT capsule Take 100 Units by mouth daily.    Historical Provider, MD    Family History Family History  Problem Relation Age of Onset  . Heart failure Mother   . Liver disease Father   . Heart failure Sister   . Hypertension Sister   . Hypertension Sister   . Colon cancer Neg Hx     Social History Social History  Substance Use Topics  . Smoking status: Current Every Day Smoker    Packs/day: 0.25    Years: 41.00    Types: Cigarettes  . Smokeless tobacco: Never Used  . Alcohol use Yes     Comment: 10/10/2015 "last  drink was 2-3 months ago; h/o abuse"     Allergies   Patient has no known allergies.   Review of Systems Review of Systems  Constitutional: Positive for fever (subjective). Negative for chills.  HENT: Positive for congestion and rhinorrhea.   Respiratory: Positive for cough and wheezing.   Cardiovascular: Negative for chest pain.  Gastrointestinal: Positive for abdominal pain. Negative for nausea and vomiting.  All other systems reviewed and are negative.    Physical Exam Updated Vital Signs BP 124/86 (BP Location: Right Arm)   Pulse 67   Temp 98.4 F (36.9 C) (Oral)   Resp 19   Ht 5\' 6"  (1.676 m)   Wt 223 lb 3 oz (101.2 kg)   SpO2 97%   BMI 36.02 kg/m   Physical Exam  Constitutional: He is oriented to person, place, and time. He appears well-developed and well-nourished.  HENT:  Head: Normocephalic and atraumatic.  Eyes: EOM are normal. Pupils are equal, round, and reactive to  light.  Neck: Normal range of motion. Neck supple. No JVD present.  Cardiovascular: Normal rate, regular rhythm and normal heart sounds.   No murmur heard. Pulmonary/Chest: Effort normal. He has no wheezes. He has no rales. He exhibits no tenderness.  Coarse breath sounds with slightly prolonged expiratory phase on forced exhalation  Abdominal: Soft. Bowel sounds are normal. He exhibits no distension and no mass. There is no tenderness.  Musculoskeletal: Normal range of motion. He exhibits edema.  Trace pre tibial edema  Lymphadenopathy:    He has no cervical adenopathy.  Neurological: He is alert and oriented to person, place, and time. No cranial nerve deficit. He exhibits normal muscle tone. Coordination normal.  Skin: Skin is warm and dry. No rash noted.  Psychiatric: He has a normal mood and affect. His behavior is normal. Judgment and thought content normal.  Nursing note and vitals reviewed.    ED Treatments / Results  DIAGNOSTIC STUDIES: Oxygen Saturation is 97% on RA, adequate  by my interpretation.    COORDINATION OF CARE: 12:36 AM- Pt advised of plan for treatment and pt agrees. Pt informed of his lab results. He will receive chest x-ray for further evaluation. He will also receive a Duo neb.   Labs (all labs ordered are listed, but only abnormal results are displayed) Labs Reviewed  COMPREHENSIVE METABOLIC PANEL - Abnormal; Notable for the following:       Result Value   Glucose, Bld 121 (*)    BUN <5 (*)    Calcium 8.4 (*)    Albumin 2.4 (*)    AST 42 (*)    Total Bilirubin 1.7 (*)    All other components within normal limits  CBC - Abnormal; Notable for the following:    Platelets 61 (*)    All other components within normal limits  URINALYSIS, ROUTINE W REFLEX MICROSCOPIC - Abnormal; Notable for the following:    Hgb urine dipstick SMALL (*)    Leukocytes, UA TRACE (*)    Squamous Epithelial / LPF 0-5 (*)    All other components within normal limits  LIPASE, BLOOD   Radiology Dg Chest 2 View  Result Date: 09/19/2016 CLINICAL DATA:  Cough and congestion for 2 weeks EXAM: CHEST  2 VIEW COMPARISON:  10/10/2015 FINDINGS: There is mild cardiomegaly, unchanged. There is prior sternotomy and CABG. There are intact appearances of the transvenous cardiac leads. The lungs are clear. There is no pleural effusion. Hilar and mediastinal contours are unremarkable and unchanged. Pulmonary vasculature is normal. IMPRESSION: Unchanged mild cardiomegaly.  No acute findings. Electronically Signed   By: Andreas Newport M.D.   On: 09/19/2016 01:21    Procedures Procedures (including critical care time)  Medications Ordered in ED Medications  predniSONE (DELTASONE) tablet 60 mg (not administered)  ipratropium-albuterol (DUONEB) 0.5-2.5 (3) MG/3ML nebulizer solution 3 mL (3 mLs Nebulization Given 09/19/16 0101)     Initial Impression / Assessment and Plan / ED Course  Delora Fuel, MD has reviewed the triage vital signs and the nursing notes.  Pertinent labs &  imaging results that were available during my care of the patient were reviewed by me and considered in my medical decision making (see chart for details).  Clinical Course    Respiratory tract infection-rule out pneumonia. Physical exam suggests some element of bronchospasm. He is given albuterol with ipratropium via nebulizer with excellent improvement. Chest x-ray shows no evidence of pneumonia. He is given a dose of prednisone and is discharged with prescriptions  for prednisone and albuterol inhaler. Follow-up with PCP.  Final Clinical Impressions(s) / ED Diagnoses   Final diagnoses:  Acute bronchitis, unspecified organism    New Prescriptions New Prescriptions   ALBUTEROL (PROVENTIL HFA;VENTOLIN HFA) 108 (90 BASE) MCG/ACT INHALER    Inhale 2 puffs into the lungs every 4 (four) hours as needed for wheezing or shortness of breath (or coughing).   PREDNISONE (DELTASONE) 50 MG TABLET    Take 1 tablet (50 mg total) by mouth daily.   I personally performed the services described in this documentation, which was scribed in my presence. The recorded information has been reviewed and is accurate.       Delora Fuel, MD [phone]

## 2016-09-19 NOTE — ED Notes (Signed)
EDP at bedside  

## 2016-11-06 ENCOUNTER — Encounter: Payer: Medicare Other | Admitting: *Deleted

## 2016-11-07 ENCOUNTER — Telehealth: Payer: Self-pay | Admitting: Cardiology

## 2016-11-07 NOTE — Telephone Encounter (Signed)
LMOVM reminding pt to send remote transmission.   

## 2016-11-09 ENCOUNTER — Encounter: Payer: Self-pay | Admitting: Cardiology

## 2016-11-26 ENCOUNTER — Ambulatory Visit (INDEPENDENT_AMBULATORY_CARE_PROVIDER_SITE_OTHER): Payer: Medicare Other | Admitting: *Deleted

## 2016-11-26 DIAGNOSIS — I480 Paroxysmal atrial fibrillation: Secondary | ICD-10-CM

## 2016-11-27 ENCOUNTER — Encounter: Payer: Self-pay | Admitting: Cardiology

## 2016-11-27 NOTE — Progress Notes (Signed)
Remote pacemaker transmission.   

## 2016-11-29 LAB — CUP PACEART REMOTE DEVICE CHECK
Battery Remaining Longevity: 130 mo
Brady Statistic AP VP Percent: 1 %
Brady Statistic AS VS Percent: 48 %
Implantable Lead Implant Date: 20050527
Implantable Lead Location: 753859
Implantable Pulse Generator Implant Date: 20110614
Lead Channel Impedance Value: 450 Ohm
Lead Channel Pacing Threshold Amplitude: 1 V
Lead Channel Pacing Threshold Amplitude: 1.125 V
Lead Channel Pacing Threshold Pulse Width: 0.4 ms
Lead Channel Sensing Intrinsic Amplitude: 8 mV
Lead Channel Setting Pacing Amplitude: 2 V
Lead Channel Setting Pacing Amplitude: 2.25 V
Lead Channel Setting Pacing Pulse Width: 0.4 ms
MDC IDC LEAD IMPLANT DT: 20110614
MDC IDC LEAD LOCATION: 753860
MDC IDC MSMT BATTERY IMPEDANCE: 228 Ohm
MDC IDC MSMT BATTERY VOLTAGE: 2.79 V
MDC IDC MSMT LEADCHNL RA PACING THRESHOLD PULSEWIDTH: 0.4 ms
MDC IDC MSMT LEADCHNL RA SENSING INTR AMPL: 2.8 mV
MDC IDC MSMT LEADCHNL RV IMPEDANCE VALUE: 612 Ohm
MDC IDC SESS DTM: 20180209212439
MDC IDC SET LEADCHNL RV SENSING SENSITIVITY: 4 mV
MDC IDC STAT BRADY AP VS PERCENT: 52 %
MDC IDC STAT BRADY AS VP PERCENT: 0 %

## 2016-12-27 ENCOUNTER — Telehealth: Payer: Self-pay

## 2016-12-27 NOTE — Telephone Encounter (Signed)
Received request for "Lab Results Information" from Berniece Andreas, RN Case Manager at [employer]. Requested information - last BP (100/63 on 08/07/16), last pulse rate (74 bmp on 08/07/16), and EF (55-65% on 03/08/04). Also submitted patient's current medications and allergies as requested. Faxed above information to Lauren on 12/20/16.

## 2017-02-15 ENCOUNTER — Emergency Department (HOSPITAL_COMMUNITY): Payer: Medicare Other

## 2017-02-15 ENCOUNTER — Emergency Department (HOSPITAL_COMMUNITY)
Admission: EM | Admit: 2017-02-15 | Discharge: 2017-02-15 | Disposition: A | Discharge status: Home / Self Care | Payer: Medicare Other | Attending: Emergency Medicine | Admitting: Emergency Medicine

## 2017-02-15 DIAGNOSIS — I5032 Chronic diastolic (congestive) heart failure: Secondary | ICD-10-CM | POA: Diagnosis not present

## 2017-02-15 DIAGNOSIS — Z79899 Other long term (current) drug therapy: Secondary | ICD-10-CM | POA: Insufficient documentation

## 2017-02-15 DIAGNOSIS — F17210 Nicotine dependence, cigarettes, uncomplicated: Secondary | ICD-10-CM | POA: Insufficient documentation

## 2017-02-15 DIAGNOSIS — I252 Old myocardial infarction: Secondary | ICD-10-CM | POA: Insufficient documentation

## 2017-02-15 DIAGNOSIS — I110 Hypertensive heart disease with heart failure: Secondary | ICD-10-CM | POA: Insufficient documentation

## 2017-02-15 DIAGNOSIS — Z955 Presence of coronary angioplasty implant and graft: Secondary | ICD-10-CM | POA: Insufficient documentation

## 2017-02-15 DIAGNOSIS — R112 Nausea with vomiting, unspecified: Secondary | ICD-10-CM

## 2017-02-15 DIAGNOSIS — I2510 Atherosclerotic heart disease of native coronary artery without angina pectoris: Secondary | ICD-10-CM | POA: Diagnosis not present

## 2017-02-15 DIAGNOSIS — Z951 Presence of aortocoronary bypass graft: Secondary | ICD-10-CM | POA: Diagnosis not present

## 2017-02-15 DIAGNOSIS — Z96652 Presence of left artificial knee joint: Secondary | ICD-10-CM | POA: Insufficient documentation

## 2017-02-15 DIAGNOSIS — Z950 Presence of cardiac pacemaker: Secondary | ICD-10-CM | POA: Insufficient documentation

## 2017-02-15 DIAGNOSIS — R197 Diarrhea, unspecified: Secondary | ICD-10-CM | POA: Diagnosis not present

## 2017-02-15 LAB — CBC WITH DIFFERENTIAL/PLATELET
Basophils Absolute: 0 10*3/uL (ref 0.0–0.1)
Basophils Relative: 1 %
EOS ABS: 0 10*3/uL (ref 0.0–0.7)
EOS PCT: 1 %
HEMATOCRIT: 41.7 % (ref 39.0–52.0)
HEMOGLOBIN: 14.4 g/dL (ref 13.0–17.0)
Lymphocytes Relative: 19 %
Lymphs Abs: 0.8 10*3/uL (ref 0.7–4.0)
MCH: 32 pg (ref 26.0–34.0)
MCHC: 34.5 g/dL (ref 30.0–36.0)
MCV: 92.7 fL (ref 78.0–100.0)
Monocytes Absolute: 0.8 10*3/uL (ref 0.1–1.0)
Monocytes Relative: 19 %
NEUTROS PCT: 62 %
Neutro Abs: 2.6 10*3/uL (ref 1.7–7.7)
Platelets: 49 10*3/uL — ABNORMAL LOW (ref 150–400)
RBC: 4.5 MIL/uL (ref 4.22–5.81)
RDW: 14.4 % (ref 11.5–15.5)
WBC: 4.2 10*3/uL (ref 4.0–10.5)

## 2017-02-15 LAB — COMPREHENSIVE METABOLIC PANEL
ALK PHOS: 103 U/L (ref 38–126)
ALT: 28 U/L (ref 17–63)
ANION GAP: 7 (ref 5–15)
AST: 54 U/L — ABNORMAL HIGH (ref 15–41)
Albumin: 2.5 g/dL — ABNORMAL LOW (ref 3.5–5.0)
BILIRUBIN TOTAL: 2.1 mg/dL — AB (ref 0.3–1.2)
BUN: 9 mg/dL (ref 6–20)
CALCIUM: 8.2 mg/dL — AB (ref 8.9–10.3)
CO2: 22 mmol/L (ref 22–32)
Chloride: 110 mmol/L (ref 101–111)
Creatinine, Ser: 0.8 mg/dL (ref 0.61–1.24)
Glucose, Bld: 104 mg/dL — ABNORMAL HIGH (ref 65–99)
POTASSIUM: 3.8 mmol/L (ref 3.5–5.1)
Sodium: 139 mmol/L (ref 135–145)
Total Protein: 6.7 g/dL (ref 6.5–8.1)

## 2017-02-15 LAB — BRAIN NATRIURETIC PEPTIDE: B NATRIURETIC PEPTIDE 5: 83.4 pg/mL (ref 0.0–100.0)

## 2017-02-15 LAB — LIPASE, BLOOD: LIPASE: 30 U/L (ref 11–51)

## 2017-02-15 LAB — I-STAT TROPONIN, ED: TROPONIN I, POC: 0.04 ng/mL (ref 0.00–0.08)

## 2017-02-15 MED ORDER — ONDANSETRON HCL 4 MG/2ML IJ SOLN
4.0000 mg | Freq: Once | INTRAMUSCULAR | Status: AC
  Administered 2017-02-15: 4 mg via INTRAVENOUS
  Filled 2017-02-15: qty 2

## 2017-02-15 MED ORDER — ONDANSETRON 4 MG PO TBDP: 4.0000 mg | ORAL_TABLET | Freq: Three times a day (TID) | ORAL | 0 refills | Status: DC | PRN

## 2017-02-15 NOTE — ED Notes (Signed)
Bed: GK81 Expected date:  Expected time:  Means of arrival:  Comments: EMS- 66yo M, n/v/d

## 2017-02-15 NOTE — ED Provider Notes (Signed)
[facility] DEPT Provider Note   CSN: [id number] Arrival date & time: 02/15/17  1005     History   Chief Complaint Chief Complaint  Patient presents with  . N/V/D    HPI Hunter Carter is a 66 y.o. male.  Patient is a 66 year old male with a history of bypass surgery, coronary artery disease, pacemaker, cirrhosis, alcohol abuse, heart block, hypertension presenting today with nausea vomiting and diarrhea. Patient states when he woke up this morning he felt okay but after getting out of the shower he had some discomfort in his abdomen. He then started to feel hot and mildly short of breath. It initially improved and he went to get something to eat. After that the swelling and hotness came back and he began vomiting and having diarrhea. He states he's had intermittent abdominal cramps since that time which when he vomits it seems to take it away. He denies any fever. Symptoms seem to get worse after he took his zinc tablets this morning which he took for the first time.  No known sick contacts. No other acute medication changes. Patient has not taken his medications this morning. Patient has had no alcohol in the last 24 hours but states he drinks 2-3 cans of beer per week. He does not note any new swelling in his legs but will occasionally have exertional dyspnea.   The history is provided by the patient.    Past Medical History:  Diagnosis Date  . Acute hepatic encephalopathy 10/10/2015   [name] 10/10/2015  . Arthritis    "joints ache" (10/10/2015)  . Bleeding stomach ulcer    "recently" (10/10/2015)  . CAD (coronary artery disease)   . CHF (congestive heart failure) ([place])   . Cirrhosis ([place])    due to HCV/notes 10/10/2015  . ETOH abuse   . GERD (gastroesophageal reflux disease)   . Heart block AV second degree    permanent pacemaker 02/2004  . Hepatitis C   . Hyperlipidemia   . Inferior MI ([place]) 2005  . Presence of permanent cardiac pacemaker   . RBBB   . Systemic  hypertension   . Ventricular tachycardia ([place])    nonsustained    Patient Active Problem List   Diagnosis Date Noted  . Cirrhosis ([place]) 08/08/2016  . Paroxysmal atrial fibrillation ([place]) 01/05/2016  . Tobacco abuse 01/05/2016  . Hepatic encephalopathy ([place]) 10/10/2015  . Pain, joint, ankle, left 10/10/2015  . Thrombocytopathia ([place]) 10/10/2015  . HLD (hyperlipidemia) 10/10/2015  . Chronic diastolic congestive heart failure ([place]) 10/10/2015  . Altered mental status   . Increased ammonia level   . Leg swelling   . Encephalopathy acute 08/01/2015  . GI bleed 07/29/2015  . Acute GI bleeding 07/29/2015  . Acute respiratory failure ([place])   . Bleeding gastrointestinal   . Hemorrhagic shock ([place])   . CAD s/p CABG 2004 07/20/2013  . Pacemaker - dual chamber Medtronic 2011 07/20/2013  . Hepatitis C 07/20/2013  . Hyperlipidemia 07/20/2013  . PAT (paroxysmal atrial tachycardia) ([place]) 07/20/2013  . Obesity (BMI 30-39.9) 07/20/2013  . NSVT (nonsustained ventricular tachycardia) ([place]) 07/20/2013  . HTN (hypertension) 07/16/2013    Past Surgical History:  Procedure Laterality Date  . CARDIAC CATHETERIZATION  03/06/04   significant 3 vessel disease, totalled graft obtuse marginal  . CORONARY ARTERY BYPASS GRAFT  02/10/03   LIMA to LAD,SVG to first diagonal, SVT to OM1  . ESOPHAGOGASTRODUODENOSCOPY N/A 07/29/2015   Procedure: ESOPHAGOGASTRODUODENOSCOPY (EGD);  Surgeon: Clarene Essex, MD;  [facility]  ENDOSCOPY;  Service: Endoscopy;  Laterality: N/A;  . INSERT / REPLACE / REMOVE PACEMAKER  03/2010   Medtronic  . KNEE ARTHROSCOPY Left [phone]  . MEDIASTINAL EXPLORATION  02/10/2003  . NM MYOCAR PERF WALL MOTION  04/05/09   low risk scan-abnormal-mod. perfusion defect basal inferoseptal,basal inferior,mid inferoseptal,mid inferior and apical inferior regions  . TOTAL KNEE ARTHROPLASTY Left   . US ECHOCARDIOGRAPHY  01/20/07   mild MR,mild to mod. TR,trace AI,mild PI       Home  Medications    Prior to Admission medications   Medication Sig Start Date End Date Taking? Authorizing Provider  albuterol (PROVENTIL HFA;VENTOLIN HFA) 108 (90 Base) MCG/ACT inhaler Inhale 2 puffs into the lungs every 4 (four) hours as needed for wheezing or shortness of breath (or coughing). 88/4/16   Delora Fuel, MD  furosemide (LASIX) 40 MG tablet Take 1 tablet (40 mg total) by mouth as needed. Please make appointment. 05/25/16   Mihai Croitoru, MD  metoprolol (LOPRESSOR) 50 MG tablet Take 1 tablet (50 mg total) by mouth 2 (two) times daily. 12/01/15   Mihai Croitoru, MD  potassium chloride SA (K-DUR,KLOR-CON) 20 MEQ tablet Take 20 mEq by mouth daily.     Historical Provider, MD  predniSONE (DELTASONE) 50 MG tablet Take 1 tablet (50 mg total) by mouth daily. 60/6/30   Delora Fuel, MD  simvastatin (ZOCOR) 20 MG tablet Take 1 tablet (20 mg total) by mouth daily. Please make appointment. 05/25/16   Mihai Croitoru, MD  valsartan-hydrochlorothiazide (DIOVAN-HCT) 320-12.5 MG tablet Take 1 tablet by mouth daily. 08/07/16   Sanda Klein, MD    Family History Family History  Problem Relation Age of Onset  . Heart failure Mother   . Liver disease Father   . Heart failure Sister   . Hypertension Sister   . Hypertension Sister   . Colon cancer Neg Hx     Social History Social History  Substance Use Topics  . Smoking status: Current Every Day Smoker    Packs/day: 0.25    Years: 41.00    Types: Cigarettes  . Smokeless tobacco: Never Used  . Alcohol use Yes     Comment: 10/10/2015 "last drink was 2-3 months ago; h/o abuse"     Allergies   Patient has no known allergies.   Review of Systems Review of Systems  All other systems reviewed and are negative.    Physical Exam Updated Vital Signs BP 135/85   Pulse 69   Temp 98 F (36.7 C)   Resp (!) 23   SpO2 97%   Physical Exam  Constitutional: He is oriented to person, place, and time. He appears well-developed and  well-nourished. No distress.  HENT:  Head: Normocephalic and atraumatic.  Mouth/Throat: Oropharynx is clear and moist.  Eyes: Conjunctivae and EOM are normal. Pupils are equal, round, and reactive to light.  Neck: Normal range of motion. Neck supple.  Cardiovascular: Normal rate, regular rhythm and intact distal pulses.   No murmur heard. Pulmonary/Chest: Effort normal and breath sounds normal. No respiratory distress. He has no wheezes. He has no rales.  Abdominal: Soft. He exhibits no distension. There is no tenderness. There is no rebound and no guarding.  Musculoskeletal: Normal range of motion. He exhibits edema. He exhibits no tenderness.  Trace pitting edema in the ankles  Neurological: He is alert and oriented to person, place, and time.  Skin: Skin is warm and dry. No rash noted. No erythema.  Psychiatric: He has a normal  mood and affect. His behavior is normal.  Nursing note and vitals reviewed.    ED Treatments / Results  Labs (all labs ordered are listed, but only abnormal results are displayed) Labs Reviewed  CBC WITH DIFFERENTIAL/PLATELET - Abnormal; Notable for the following:       Result Value   Platelets 49 (*)    All other components within normal limits  COMPREHENSIVE METABOLIC PANEL - Abnormal; Notable for the following:    Glucose, Bld 104 (*)    Calcium 8.2 (*)    Albumin 2.5 (*)    AST 54 (*)    Total Bilirubin 2.1 (*)    All other components within normal limits  BRAIN NATRIURETIC PEPTIDE  LIPASE, BLOOD  I-STAT TROPOININ, ED    EKG  EKG Interpretation  Date/Time:  Friday Feb 15 2017 11:03:56 EDT Ventricular Rate:  67 PR Interval:    QRS Duration: 161 QT Interval:  472 QTC Calculation: 499 R Axis:   21 Text Interpretation:  Atrial-paced complexes Right bundle branch block Inferior infarct, old No significant change since last tracing Confirmed by Maryan Rued  MD, Loree Fee ([id number]) on 02/15/2017 11:13:12 AM Also confirmed by Maryan Rued  MD, Justiss Gerbino  [phone]), editor Drema Pry [phone])  on 02/15/2017 12:15:53 PM       Radiology Dg Chest 2 View  Result Date: 02/15/2017 CLINICAL DATA:  Onset of nausea, vomiting and diarrhea today. EXAM: CHEST  2 VIEW COMPARISON:  PA and lateral chest 09/19/2016 and 10/10/2015. FINDINGS: The patient is status post CABG with a pacing device in place. There is cardiomegaly without edema. Lungs are clear. No pneumothorax or pleural effusion. No bony abnormality. IMPRESSION: Cardiomegaly without acute disease. Electronically Signed   By: Inge Rise M.D.   On: 02/15/2017 12:10    Procedures Procedures (including critical care time)  Medications Ordered in ED Medications  ondansetron (ZOFRAN) injection 4 mg (not administered)     Initial Impression / Assessment and Plan / ED Course  I have reviewed the triage vital signs and the nursing notes.  Pertinent labs & imaging results that were available during my care of the patient were reviewed by me and considered in my medical decision making (see chart for details).    Patient with nausea vomiting and diarrhea that started today after taking a zinc tablet and having an episode of diaphoresis and minimal shortness of breath. He denies any chest pain in the last several days. He is not complaining of significant fluid overload. He has no abdominal pain on exam and feel more likely symptoms today are related to a viral illness. He has no abdominal findings concerning for appendicitis, diverticulitis or pancreatitis. He has no significant findings of fluid overload and has clear breath sounds bilaterally. EKG shows a paced rhythm that is unchanged. Chest x-ray, CBC, CMP, lipase,. Patient given Zofran  2:10 PM Labs without acute findings. Chest x-ray within normal limits. Patient is tolerating by mouth's without difficulty. Patient was given Zofran when necessary and discharged home with family.  Final Clinical Impressions(s) / ED Diagnoses   Final  diagnoses:  Nausea vomiting and diarrhea    New Prescriptions New Prescriptions   ONDANSETRON (ZOFRAN ODT) 4 MG DISINTEGRATING TABLET    Take 1 tablet (4 mg total) by mouth every 8 (eight) hours as needed for nausea or vomiting.     Blanchie Dessert, MD 02/15/17 1410

## 2017-02-15 NOTE — ED Triage Notes (Addendum)
Per EMS, pt was in the shower when he starting feeling sick to his stomach. Pt reports having n/v/d once he got out the shower. Pt has a hx of CABG, htn, pacemaker, and anxiety and states he hasn't had any medications today.

## 2017-04-04 ENCOUNTER — Emergency Department (HOSPITAL_COMMUNITY)
Admission: EM | Admit: 2017-04-04 | Discharge: 2017-04-04 | Disposition: A | Discharge status: Home / Self Care | Payer: Medicare Other | Attending: Emergency Medicine | Admitting: Emergency Medicine

## 2017-04-04 ENCOUNTER — Encounter (HOSPITAL_COMMUNITY): Payer: Self-pay | Admitting: Emergency Medicine

## 2017-04-04 ENCOUNTER — Emergency Department (HOSPITAL_COMMUNITY): Payer: Medicare Other

## 2017-04-04 DIAGNOSIS — Z951 Presence of aortocoronary bypass graft: Secondary | ICD-10-CM | POA: Diagnosis not present

## 2017-04-04 DIAGNOSIS — F17210 Nicotine dependence, cigarettes, uncomplicated: Secondary | ICD-10-CM | POA: Insufficient documentation

## 2017-04-04 DIAGNOSIS — R0602 Shortness of breath: Secondary | ICD-10-CM | POA: Diagnosis present

## 2017-04-04 DIAGNOSIS — I110 Hypertensive heart disease with heart failure: Secondary | ICD-10-CM | POA: Diagnosis not present

## 2017-04-04 DIAGNOSIS — J209 Acute bronchitis, unspecified: Secondary | ICD-10-CM | POA: Diagnosis not present

## 2017-04-04 DIAGNOSIS — R079 Chest pain, unspecified: Secondary | ICD-10-CM | POA: Insufficient documentation

## 2017-04-04 DIAGNOSIS — Z79899 Other long term (current) drug therapy: Secondary | ICD-10-CM | POA: Diagnosis not present

## 2017-04-04 DIAGNOSIS — I5032 Chronic diastolic (congestive) heart failure: Secondary | ICD-10-CM | POA: Diagnosis not present

## 2017-04-04 DIAGNOSIS — I2510 Atherosclerotic heart disease of native coronary artery without angina pectoris: Secondary | ICD-10-CM | POA: Insufficient documentation

## 2017-04-04 LAB — BASIC METABOLIC PANEL
ANION GAP: 7 (ref 5–15)
BUN: <5 mg/dL — ABNORMAL LOW (ref 6–20)
CALCIUM: 8.6 mg/dL — AB (ref 8.9–10.3)
CO2: 24 mmol/L (ref 22–32)
Chloride: 106 mmol/L (ref 101–111)
Creatinine, Ser: 0.85 mg/dL (ref 0.61–1.24)
GFR calc non Af Amer: >60 mL/min (ref >60)
Glucose, Bld: 121 mg/dL — ABNORMAL HIGH (ref 65–99)
POTASSIUM: 3.9 mmol/L (ref 3.5–5.1)
Sodium: 137 mmol/L (ref 135–145)

## 2017-04-04 LAB — I-STAT TROPONIN, ED: TROPONIN I, POC: 0.06 ng/mL (ref 0.00–0.08)

## 2017-04-04 LAB — CBC
HEMATOCRIT: 45.3 % (ref 39.0–52.0)
HEMOGLOBIN: 15.4 g/dL (ref 13.0–17.0)
MCH: 32.2 pg (ref 26.0–34.0)
MCHC: 34 g/dL (ref 30.0–36.0)
MCV: 94.8 fL (ref 78.0–100.0)
Platelets: 54 10*3/uL — ABNORMAL LOW (ref 150–400)
RBC: 4.78 MIL/uL (ref 4.22–5.81)
RDW: 14.4 % (ref 11.5–15.5)
WBC: 3.9 10*3/uL — ABNORMAL LOW (ref 4.0–10.5)

## 2017-04-04 LAB — BRAIN NATRIURETIC PEPTIDE: B Natriuretic Peptide: 55.8 pg/mL (ref 0.0–100.0)

## 2017-04-04 MED ORDER — AEROCHAMBER PLUS W/MASK MISC
1.0000 | Freq: Once | Status: DC
  Filled 2017-04-04: qty 1

## 2017-04-04 MED ORDER — ALBUTEROL SULFATE HFA 108 (90 BASE) MCG/ACT IN AERS
2.0000 | INHALATION_SPRAY | RESPIRATORY_TRACT | Status: DC | PRN
  Filled 2017-04-04: qty 6.7

## 2017-04-04 NOTE — Progress Notes (Signed)
RT instructed pt on the use of a spacer with his MDI inhaler.  Pt was able to demonstrate back good technique.  All questions answered.

## 2017-04-04 NOTE — ED Triage Notes (Signed)
Pt states he had a CABG several years ago. Pt states for a week he has been having some SOB and "breathing congestion." Also complains of some chest "soreness." Today he had a dizzy spell and nausea.

## 2017-04-04 NOTE — ED Notes (Signed)
Pt refused to take pants off

## 2017-04-04 NOTE — Discharge Instructions (Signed)
Use your albuterol inhaler with spacer 2 puffs every 4 hours as needed for cough or shortness of breath. Return if needed more than every 4 hours or if concern for any reason. Symptoms of bronchitis typically lasts for several weeks. Call Dr.Holwerda to schedule appointment if you're not feeling better within the next 2 weeks. Ask Dr.Holwerda to help you to stop smoking. Return if concern for any reason

## 2017-04-04 NOTE — ED Provider Notes (Signed)
[facility] DEPT Provider Note   CSN: [id number] Arrival date & time: 04/04/17  7035     History   Chief Complaint Chief Complaint  Patient presents with  . Chest Pain  . Shortness of Breath    HPI Jabes Primo is a 66 y.o. male.Complains of cough productive of yellow sputum with mild shortness of breath onset approximately 4 weeks ago. Constant feels congested in his chest. No fever. Symptoms improved with exertion not made worse by anything. No fever and he felt mildly lightheaded and nauseated this morning after coughing fit. He is treated himself with an albuterol inhaler once every other day, without relief. He reports that he's run out of his inhaler  HPI  Past Medical History:  Diagnosis Date  . Acute hepatic encephalopathy 10/10/2015   [name] 10/10/2015  . Arthritis    "joints ache" (10/10/2015)  . Bleeding stomach ulcer    "recently" (10/10/2015)  . CAD (coronary artery disease)   . CHF (congestive heart failure) ([place])   . Cirrhosis ([place])    due to HCV/notes 10/10/2015  . ETOH abuse   . GERD (gastroesophageal reflux disease)   . Heart block AV second degree    permanent pacemaker 02/2004  . Hepatitis C   . Hyperlipidemia   . Inferior MI ([place]) 2005  . Presence of permanent cardiac pacemaker   . RBBB   . Systemic hypertension   . Ventricular tachycardia ([place])    nonsustained    Patient Active Problem List   Diagnosis Date Noted  . Cirrhosis ([place]) 08/08/2016  . Paroxysmal atrial fibrillation ([place]) 01/05/2016  . Tobacco abuse 01/05/2016  . Hepatic encephalopathy ([place]) 10/10/2015  . Pain, joint, ankle, left 10/10/2015  . Thrombocytopathia ([place]) 10/10/2015  . HLD (hyperlipidemia) 10/10/2015  . Chronic diastolic congestive heart failure ([place]) 10/10/2015  . Altered mental status   . Increased ammonia level   . Leg swelling   . Encephalopathy acute 08/01/2015  . GI bleed 07/29/2015  . Acute GI bleeding 07/29/2015  . Acute respiratory failure ([place])    . Bleeding gastrointestinal   . Hemorrhagic shock ([place])   . CAD s/p CABG 2004 07/20/2013  . Pacemaker - dual chamber Medtronic 2011 07/20/2013  . Hepatitis C 07/20/2013  . Hyperlipidemia 07/20/2013  . PAT (paroxysmal atrial tachycardia) ([place]) 07/20/2013  . Obesity (BMI 30-39.9) 07/20/2013  . NSVT (nonsustained ventricular tachycardia) ([place]) 07/20/2013  . HTN (hypertension) 07/16/2013    Past Surgical History:  Procedure Laterality Date  . CARDIAC CATHETERIZATION  03/06/04   significant 3 vessel disease, totalled graft obtuse marginal  . CORONARY ARTERY BYPASS GRAFT  02/10/03   LIMA to LAD,SVG to first diagonal, SVT to OM1  . ESOPHAGOGASTRODUODENOSCOPY N/A 07/29/2015   Procedure: ESOPHAGOGASTRODUODENOSCOPY (EGD);  Surgeon: Clarene Essex, MD;  Location: [facility] ENDOSCOPY;  Service: Endoscopy;  Laterality: N/A;  . INSERT / REPLACE / REMOVE PACEMAKER  03/2010   Medtronic  . KNEE ARTHROSCOPY Left [phone]  . MEDIASTINAL EXPLORATION  02/10/2003  . NM MYOCAR PERF WALL MOTION  04/05/09   low risk scan-abnormal-mod. perfusion defect basal inferoseptal,basal inferior,mid inferoseptal,mid inferior and apical inferior regions  . TOTAL KNEE ARTHROPLASTY Left   . US ECHOCARDIOGRAPHY  01/20/07   mild MR,mild to mod. TR,trace AI,mild PI       Home Medications    Prior to Admission medications   Medication Sig Start Date End Date Taking? Authorizing Provider  albuterol (PROVENTIL HFA;VENTOLIN HFA) 108 (90 Base) MCG/ACT inhaler Inhale 2 puffs into the lungs every  4 (four) hours as needed for wheezing or shortness of breath (or coughing). 16/6/06   Delora Fuel, MD  furosemide (LASIX) 40 MG tablet Take 1 tablet (40 mg total) by mouth as needed. Please make appointment. 05/25/16   Croitoru, Mihai, MD  metoprolol (LOPRESSOR) 50 MG tablet Take 1 tablet (50 mg total) by mouth 2 (two) times daily. 12/01/15   Croitoru, Mihai, MD  ondansetron (ZOFRAN ODT) 4 MG disintegrating tablet Take 1 tablet (4 mg total)  by mouth every 8 (eight) hours as needed for nausea or vomiting. 02/15/17   Blanchie Dessert, MD  potassium chloride SA (K-DUR,KLOR-CON) 20 MEQ tablet Take 20 mEq by mouth daily.     [provider]  predniSONE (DELTASONE) 50 MG tablet Take 1 tablet (50 mg total) by mouth daily. 30/1/60   Delora Fuel, MD  simvastatin (ZOCOR) 20 MG tablet Take 1 tablet (20 mg total) by mouth daily. Please make appointment. 05/25/16   Croitoru, Mihai, MD  valsartan-hydrochlorothiazide (DIOVAN-HCT) 320-12.5 MG tablet Take 1 tablet by mouth daily. 08/07/16   Croitoru, Dani Gobble, MD    Family History Family History  Problem Relation Age of Onset  . Heart failure Mother   . Liver disease Father   . Heart failure Sister   . Hypertension Sister   . Hypertension Sister   . Colon cancer Neg Hx     Social History Social History  Substance Use Topics  . Smoking status: Current Every Day Smoker    Packs/day: 0.25    Years: 41.00    Types: Cigarettes  . Smokeless tobacco: Never Used  . Alcohol use Yes     Comment: 10/10/2015 "last drink was 2-3 months ago; h/o abuse"     Allergies   Patient has no known allergies.   Review of Systems Review of Systems  Respiratory: Positive for shortness of breath.        Not short of breath presently  Cardiovascular: Positive for chest pain.       Chest pain worse with coughing  Gastrointestinal: Positive for nausea.  Neurological: Positive for light-headedness.       Not lightheaded presently  All other systems reviewed and are negative.    Physical Exam Updated Vital Signs BP (!) 133/93 (BP Location: Left Arm)   Pulse 79   Temp 97.6 F (36.4 C) (Oral)   Resp 16   Ht 5\' 6"  (1.676 m)   Wt 99.8 kg (220 lb)   SpO2 97%   BMI 35.51 kg/m   Physical Exam  Constitutional: He appears well-developed and well-nourished.  HENT:  Head: Normocephalic and atraumatic.  Eyes: Conjunctivae are normal. Pupils are equal, round, and reactive to light.  Neck: Neck  supple. No tracheal deviation present. No thyromegaly present.  Cardiovascular: Normal rate and regular rhythm.   No murmur heard. Pulmonary/Chest: Effort normal and breath sounds normal.  Abdominal: Soft. Bowel sounds are normal. He exhibits no distension. There is no tenderness.  Musculoskeletal: Normal range of motion. He exhibits no edema or tenderness.  Neurological: He is alert. Coordination normal.  Skin: Skin is warm and dry. No rash noted.  Psychiatric: He has a normal mood and affect.  Nursing note and vitals reviewed.    ED Treatments / Results  Labs (all labs ordered are listed, but only abnormal results are displayed) Labs Reviewed  [place], ED    EKG  EKG Interpretation  Date/Time:  Thursday April 04 2017 07:42:33 EDT Ventricular Rate:  88 PR Interval:  166 QRS Duration: 138 QT Interval:  438 QTC Calculation: 529 R Axis:   144 Text Interpretation:  Atrial-paced rhythm with occasional ventricular-paced complexes and with occasional sinus complexes Right bundle branch block Possible Lateral infarct , age undetermined Inferior infarct , age undetermined Abnormal ECG agree. no sig change from previous Confirmed by Charlesetta Shanks [phone]) on 04/04/2017 7:49:33 AM      Results for orders placed or performed during the hospital encounter of 84/69/62  Basic metabolic panel  Result Value Ref Range   Sodium 137 135 - 145 mmol/L   Potassium 3.9 3.5 - 5.1 mmol/L   Chloride 106 101 - 111 mmol/L   CO2 24 22 - 32 mmol/L   Glucose, Bld 121 (H) 65 - 99 mg/dL   BUN <5 (L) 6 - 20 mg/dL   Creatinine, Ser 0.85 0.61 - 1.24 mg/dL   Calcium 8.6 (L) 8.9 - 10.3 mg/dL   GFR calc non Af Amer >60 >60 mL/min   GFR calc Af Amer >60 >60 mL/min   Anion gap 7 5 - 15  CBC  Result Value Ref Range   WBC 3.9 (L) 4.0 - 10.5 K/uL   RBC 4.78 4.22 - 5.81 MIL/uL   Hemoglobin 15.4 13.0 - 17.0 g/dL   HCT 45.3 39.0 - 52.0 %   MCV 94.8 78.0 - 100.0 fL   MCH  32.2 26.0 - 34.0 pg   MCHC 34.0 30.0 - 36.0 g/dL   RDW 14.4 11.5 - 15.5 %   Platelets 54 (L) 150 - 400 K/uL  Brain natriuretic peptide  Result Value Ref Range   B Natriuretic Peptide 55.8 0.0 - 100.0 pg/mL  I-stat troponin, ED  Result Value Ref Range   Troponin i, poc 0.06 0.00 - 0.08 ng/mL   Comment 3           Dg Chest 2 View  Result Date: 04/04/2017 CLINICAL DATA:  66 year old male with fever and congestion for 1 week. Chest pain. EXAM: CHEST  2 VIEW COMPARISON:  02/15/2017 and earlier. FINDINGS: Stable mild cardiomegaly. Prior CABG. Stable left chest cardiac pacemaker. Other mediastinal contours are within normal limits. Visualized tracheal air column is within normal limits. Stable pulmonary vascularity without pulmonary edema. No pneumothorax, pleural effusion or confluent pulmonary opacity. No acute osseous abnormality identified. Negative visible bowel gas pattern. IMPRESSION: No acute cardiopulmonary abnormality. Electronically Signed   By: Genevie Ann M.D.   On: 04/04/2017 08:13   Radiology Dg Chest 2 View  Result Date: 04/04/2017 CLINICAL DATA:  66 year old male with fever and congestion for 1 week. Chest pain. EXAM: CHEST  2 VIEW COMPARISON:  02/15/2017 and earlier. FINDINGS: Stable mild cardiomegaly. Prior CABG. Stable left chest cardiac pacemaker. Other mediastinal contours are within normal limits. Visualized tracheal air column is within normal limits. Stable pulmonary vascularity without pulmonary edema. No pneumothorax, pleural effusion or confluent pulmonary opacity. No acute osseous abnormality identified. Negative visible bowel gas pattern. IMPRESSION: No acute cardiopulmonary abnormality. Electronically Signed   By: Genevie Ann M.D.   On: 04/04/2017 08:13  Chest x-ray viewed by me  Procedures Procedures (including critical care time)  Medications Ordered in ED Medications - No data to display   Initial Impression / Assessment and Plan / ED Course  I have reviewed the  triage vital signs and the nursing notes.  Pertinent labs & imaging results that were available during my care of the patient were reviewed by me and considered in my medical decision  making (see chart for details).     Symptoms consistent with acute bronchitis. Plan he'll get an albuterol HFA with spacer to go to use 2 puffs every 4 hours as needed for cough or shortness of breath. I counseled patient for 5 minutes on smoking cessation. He is referred back to his primary care physician at [facility]  Final Clinical Impressions(s) / ED Diagnoses  Diagnosis #1 acute bronchitis #2 tobacco abuse Final diagnoses:  None    New Prescriptions New Prescriptions   No medications on file     Orlie Dakin, MD 04/04/17 1025

## 2017-05-13 ENCOUNTER — Telehealth: Payer: Self-pay | Admitting: Cardiovascular Disease

## 2017-05-13 NOTE — Telephone Encounter (Signed)
Did not need the encounter °

## 2017-06-26 ENCOUNTER — Other Ambulatory Visit: Payer: Self-pay | Admitting: Nurse Practitioner

## 2017-06-26 DIAGNOSIS — K7469 Other cirrhosis of liver: Secondary | ICD-10-CM

## 2017-07-15 ENCOUNTER — Telehealth: Payer: Self-pay | Admitting: Cardiovascular Disease

## 2017-07-15 MED ORDER — LOSARTAN POTASSIUM-HCTZ 100-12.5 MG PO TABS: 1.0000 | ORAL_TABLET | Freq: Every day | ORAL | 0 refills | Status: DC

## 2017-07-15 NOTE — Telephone Encounter (Signed)
Start Irbesartan 100-12.5mg  as directed by Hunter Carter current memo. Patient states that he does not have BP cuff and he will call back and make BP nurse visit to check this

## 2017-07-15 NOTE — Telephone Encounter (Signed)
Per ZOX:WRUEAVWU 100 daily plus HCTZ 12.5mg  daily; only 30 day supply without refills  Need f/u appt with Dr Sallyanne Kuster or PA (last seen >1 yr ago) prior to next refill authorization (Routing comment)   Tried to call pt back-no mailbox available to leave message. New rx sent to pharmacy requested will call back later.

## 2017-07-15 NOTE — Telephone Encounter (Signed)
S/w pt he states that his sister in law comes over and takes her BP and he states that it has been good. Will discuss with RPH to get new dosing/medication suggestions

## 2017-07-15 NOTE — Telephone Encounter (Signed)
New message      Pt c/o medication issue:  1. Name of Medication:  Valsartan-HCTZ 2. How are you currently taking this medication (dosage and times per day)? 320-12.5mg  3. Are you having a reaction (difficulty breathing--STAT)? no 4. What is your medication issue?  Valsartan has been recalled.  Calling to get another medication called in to [facility] at [address].  Nurse also said due to finances, pt has not been taking this medication.  She did not know how long he has been off of medication.  Please call pt

## 2017-07-18 ENCOUNTER — Ambulatory Visit (INDEPENDENT_AMBULATORY_CARE_PROVIDER_SITE_OTHER): Payer: Medicare Other | Admitting: Physician Assistant

## 2017-07-18 ENCOUNTER — Encounter: Payer: Self-pay | Admitting: Physician Assistant

## 2017-07-18 VITALS — BP 130/86 | HR 94 | Ht 69.0 in | Wt 216.0 lb

## 2017-07-18 DIAGNOSIS — K7460 Unspecified cirrhosis of liver: Secondary | ICD-10-CM

## 2017-07-18 DIAGNOSIS — I25810 Atherosclerosis of coronary artery bypass graft(s) without angina pectoris: Secondary | ICD-10-CM | POA: Diagnosis not present

## 2017-07-18 DIAGNOSIS — I480 Paroxysmal atrial fibrillation: Secondary | ICD-10-CM

## 2017-07-18 DIAGNOSIS — Z950 Presence of cardiac pacemaker: Secondary | ICD-10-CM | POA: Diagnosis not present

## 2017-07-18 DIAGNOSIS — Z79899 Other long term (current) drug therapy: Secondary | ICD-10-CM

## 2017-07-18 DIAGNOSIS — E785 Hyperlipidemia, unspecified: Secondary | ICD-10-CM | POA: Diagnosis not present

## 2017-07-18 DIAGNOSIS — R5383 Other fatigue: Secondary | ICD-10-CM

## 2017-07-18 MED ORDER — LOSARTAN POTASSIUM 25 MG PO TABS: 25.0000 mg | ORAL_TABLET | Freq: Every day | ORAL | 5 refills | Status: DC

## 2017-07-18 NOTE — Progress Notes (Signed)
Cardiology Office Note    Date:  07/20/2017   ID:  Hunter Carter, DOB 09-Jul-1951, MRN [id number]  PCP:  Velna Hatchet, MD  Cardiologist:  Dr. Sallyanne Kuster   Chief Complaint  Patient presents with  . Follow-up    seen for Dr. Sallyanne Kuster    History of Present Illness:  Hunter Carter is a 66 y.o. male with PMH of CAD s/p CABG 2004(LIMA to LAD, SVG to diag with jump graft to OM), PAD, 2nd degree AV block s/p MDT PPM, PAF, HLD, EtOH abuse, RBBB and NSVT. In May 2005, he presented with acute MI and found to have total occlusion of vein graft to OM branch, and this was treated medically. Myoview in 2010 showed moderate scar in the distribution of RCA without ischemia, normal LV EF. He had pacemaker implantation in 2005 for second degree AV block, he had subsequent generator change in 2011 with Medtronic dual-chamber device. He also have a history of hepatitis C and cirrhosis with evidence of parenchymal insufficiency, coagulopathy, thrombocytopenia and previous hospitalization for GI bleed and hepatic encephalopathy. He had GI bleed secondary to gastric ulcers in November 2016 requiring blood transfusion. He was last seen by Dr. Sallyanne Kuster in October 2017. His valsartan-HCTZ was decreased to 320-12.5 mg. He has history of protracted asymptomatic atrial fibrillation, due to bleeding risk, he is not on any systemic anticoagulation. He was recently switched to a losartan/HCT 100 mg-12.5 mg  Patient presents today for cardiology visit. He denies any exertional chest pain or shortness of breath. He has not picked up his losartan-HCTZ from the pharmacy. Surprisingly even without this high dose blood pressure medication, his current blood pressure is 130/86 on metoprolol 50 mg twice a day. He is not taking his potassium supplement. As far as PRN dose of Lasix, he is essentially taking 40 mg every other day to control lower extremity edema. I am hesitant for him to restart on losartan-HCTZ 100-12.5 mg as it may  drop his blood pressure too low. I will switch this to losartan 25 mg daily. At some point, we should consider switching his Lopressor to a nonselective beta blocker such as propranolol or carvedilol which may be more beneficial in patients with portal hypertension due to cirrhosis. He does have intermittent diffuse lower abdominal pain for the past year, I encouraged him to discuss this with his GI doctor. Otherwise, he does not appear to have significant volume overload on exam. He is due to have a remote transmission in 3 month and he can follow-up with Dr. Sallyanne Kuster in 6 month.    Past Medical History:  Diagnosis Date  . Acute hepatic encephalopathy 10/10/2015   [name] 10/10/2015  . Arthritis    "joints ache" (10/10/2015)  . Bleeding stomach ulcer    "recently" (10/10/2015)  . CAD (coronary artery disease)   . CHF (congestive heart failure) ([place])   . Cirrhosis ([place])    due to HCV/notes 10/10/2015  . ETOH abuse   . GERD (gastroesophageal reflux disease)   . Heart block AV second degree    permanent pacemaker 02/2004  . Hepatitis C   . Hyperlipidemia   . Inferior MI ([place]) 2005  . Presence of permanent cardiac pacemaker   . RBBB   . Systemic hypertension   . Ventricular tachycardia ([facility])    nonsustained    Past Surgical History:  Procedure Laterality Date  . CARDIAC CATHETERIZATION  03/06/04   significant 3 vessel disease, totalled graft obtuse marginal  . CORONARY ARTERY BYPASS  GRAFT  02/10/03   LIMA to LAD,SVG to first diagonal, SVT to OM1  . ESOPHAGOGASTRODUODENOSCOPY N/A 07/29/2015   Procedure: ESOPHAGOGASTRODUODENOSCOPY (EGD);  Surgeon: Clarene Essex, MD;  Location: [facility] ENDOSCOPY;  Service: Endoscopy;  Laterality: N/A;  . INSERT / REPLACE / REMOVE PACEMAKER  03/2010   Medtronic  . KNEE ARTHROSCOPY Left [phone]  . MEDIASTINAL EXPLORATION  02/10/2003  . NM MYOCAR PERF WALL MOTION  04/05/09   low risk scan-abnormal-mod. perfusion defect basal inferoseptal,basal inferior,mid  inferoseptal,mid inferior and apical inferior regions  . TOTAL KNEE ARTHROPLASTY Left   . US ECHOCARDIOGRAPHY  01/20/07   mild MR,mild to mod. TR,trace AI,mild PI    Current Medications: Outpatient Medications Prior to Visit  Medication Sig Dispense Refill  . albuterol (PROVENTIL HFA;VENTOLIN HFA) 108 (90 Base) MCG/ACT inhaler Inhale 2 puffs into the lungs every 4 (four) hours as needed for wheezing or shortness of breath (or coughing). 1 Inhaler 0  . furosemide (LASIX) 40 MG tablet Take 1 tablet (40 mg total) by mouth as needed. Please make appointment. 90 tablet 0  . metoprolol (LOPRESSOR) 50 MG tablet Take 1 tablet (50 mg total) by mouth 2 (two) times daily. 60 tablet 2  . ondansetron (ZOFRAN ODT) 4 MG disintegrating tablet Take 1 tablet (4 mg total) by mouth every 8 (eight) hours as needed for nausea or vomiting. 10 tablet 0  . simvastatin (ZOCOR) 20 MG tablet Take 1 tablet (20 mg total) by mouth daily. Please make appointment. 90 tablet 0  . losartan-hydrochlorothiazide (HYZAAR) 100-12.5 MG tablet Take 1 tablet by mouth daily. 30 tablet 0  . potassium chloride SA (K-DUR,KLOR-CON) 20 MEQ tablet Take 20 mEq by mouth daily.     . predniSONE (DELTASONE) 50 MG tablet Take 1 tablet (50 mg total) by mouth daily. (Patient not taking: Reported on 07/18/2017) 5 tablet 0   No facility-administered medications prior to visit.      Allergies:   Patient has no known allergies.   Social History   Social History  . Marital status: Widowed    Spouse name: N/A  . Number of children: N/A  . Years of education: N/A   Social History Main Topics  . Smoking status: Current Every Day Smoker    Packs/day: 0.25    Years: 41.00    Types: Cigarettes  . Smokeless tobacco: Never Used  . Alcohol use Yes     Comment: 10/10/2015 "last drink was 2-3 months ago; h/o abuse"  . Drug use: No  . Sexual activity: Not Asked   Other Topics Concern  . None   Social History Narrative  . None     Family  History:  The patient's family history includes Heart failure in his mother and sister; Hypertension in his sister and sister; Liver disease in his father.   ROS:   Please see the history of present illness.    ROS All other systems reviewed and are negative.   PHYSICAL EXAM:   VS:  BP 130/86   Pulse 94   Ht 5\' 9"  (1.753 m)   Wt 216 lb (98 kg)   SpO2 95%   BMI 31.90 kg/m    GEN: Well nourished, well developed, in no acute distress  HEENT: normal  Neck: no JVD, carotid bruits, or masses Cardiac: RRR; no murmurs, rubs, or gallops,no edema  Respiratory:  clear to auscultation bilaterally, normal work of breathing GI: soft, nontender, nondistended, + BS MS: no deformity or atrophy  Skin: warm and dry, no  rash Neuro:  Alert and Oriented x 3, Strength and sensation are intact Psych: euthymic mood, full affect  Wt Readings from Last 3 Encounters:  07/18/17 216 lb (98 kg)  04/04/17 220 lb (99.8 kg)  09/18/16 223 lb 3 oz (101.2 kg)      Studies/Labs Reviewed:   EKG:  EKG is ordered today.  The ekg ordered today demonstrates Sinus rhythm, right bundle branch block, occasional PACs.  Recent Labs: 02/15/2017: ALT 28 04/04/2017: B Natriuretic Peptide 55.8; BUN <5; Creatinine, Ser 0.85; Hemoglobin 15.4; Platelets 54; Potassium 3.9; Sodium 137   Lipid Panel    Component Value Date/Time   CHOL 111 10/11/2015 0138   TRIG 64 10/11/2015 0138   HDL 32 (L) 10/11/2015 0138   CHOLHDL 3.5 10/11/2015 0138   VLDL 13 10/11/2015 0138   LDLCALC 66 10/11/2015 0138    Additional studies/ records that were reviewed today include:   Myoview 04/05/2009    ASSESSMENT:    1. Fatigue, unspecified type   2. Hepatic cirrhosis, unspecified hepatic cirrhosis type, unspecified whether ascites present ([place])   3. Encounter for long-term (current) use of medications   4. Coronary artery disease involving coronary bypass graft of native heart without angina pectoris   5. Pacemaker   6. Hyperlipidemia,  unspecified hyperlipidemia type   7. PAF (paroxysmal atrial fibrillation) ([facility])      PLAN:  In order of problems listed above:  1. Fatigue: He has multiple reasons to be fatigued, he does not appears to be volume overloaded on physical exam. We'll obtain CBC, basic metabolic panel and ammonia level. He is on lactulose. He has not started on losartan-HCTZ 100-12 0.5 mg. He is blood pressure is actually normal prior to starting this medication, I will switch the medication to losartan 25 mg daily.  2. CAD s/p CABG: No obvious chest pain  3. 2nd degree AV block s/p MDT PPM: Will require a remote transmission in 3 month  4. Hyperlipidemia: Continue simvastatin  5. PAF: Due to bleeding risk, he is not on systemic anticoagulation.  6. Hepatic cirrhosis: On lactulose    Medication Adjustments/Labs and Tests Ordered: Current medicines are reviewed at length with the patient today.  Concerns regarding medicines are outlined above.  Medication changes, Labs and Tests ordered today are listed in the Patient Instructions below. Patient Instructions  Medication Instructions:   STOP losartan-HCTZ  START Losartan 25mg  DAILY  Check your blood pressures at home and keep a diary of these readings.  Labwork:   Your physician recommends that you return to the [place] office for lab work in 1 week. You may come in any time Monday through Friday between 8am to 12:45pm, and 1:45pm to 5pm  Testing/Procedures:  none  Follow-Up:  Send remote pacemaker transmission in 3 months  Follow up with Dr. Sallyanne Kuster in 6 months. We will send you a letter in the mail when it is time to schedule.  If you need a refill on your cardiac medications before your next appointment, please call your pharmacy.      Hilbert Corrigan, [place]  07/20/2017 8:01 AM    [address], [place], [place]  [address] Phone: [phone]; Fax: [phone]

## 2017-07-18 NOTE — Patient Instructions (Addendum)
Medication Instructions:   STOP losartan-HCTZ  START Losartan 25mg  DAILY  Check your blood pressures at home and keep a diary of these readings.  Labwork:   Your physician recommends that you return to the [place] office for lab work in 1 week. You may come in any time Monday through Friday between 8am to 12:45pm, and 1:45pm to 5pm  Testing/Procedures:  none  Follow-Up:  Send remote pacemaker transmission in 3 months  Follow up with Dr. Sallyanne Kuster in 6 months. We will send you a letter in the mail when it is time to schedule.  If you need a refill on your cardiac medications before your next appointment, please call your pharmacy.

## 2017-07-20 ENCOUNTER — Encounter: Payer: Self-pay | Admitting: Physician Assistant

## 2017-07-29 ENCOUNTER — Other Ambulatory Visit: Payer: Self-pay | Admitting: *Deleted

## 2017-07-29 MED ORDER — LOSARTAN POTASSIUM 25 MG PO TABS: 25.0000 mg | ORAL_TABLET | Freq: Every day | ORAL | 5 refills | Status: DC

## 2017-07-31 ENCOUNTER — Other Ambulatory Visit: Payer: Self-pay

## 2017-07-31 MED ORDER — LOSARTAN POTASSIUM 25 MG PO TABS: 25.0000 mg | ORAL_TABLET | Freq: Every day | ORAL | 1 refills | Status: DC

## 2017-07-31 MED ORDER — LOSARTAN POTASSIUM 25 MG PO TABS: 25.0000 mg | ORAL_TABLET | Freq: Every day | ORAL | 5 refills | Status: DC

## 2017-08-13 ENCOUNTER — Encounter: Payer: Self-pay | Admitting: *Deleted

## 2017-09-07 ENCOUNTER — Other Ambulatory Visit: Payer: Self-pay

## 2017-09-07 ENCOUNTER — Emergency Department (HOSPITAL_COMMUNITY): Payer: Medicare Other

## 2017-09-07 ENCOUNTER — Encounter (HOSPITAL_COMMUNITY): Payer: Self-pay | Admitting: Internal Medicine

## 2017-09-07 ENCOUNTER — Inpatient Hospital Stay (HOSPITAL_COMMUNITY)
Admission: EM | Admit: 2017-09-07 | Discharge: 2017-09-09 | DRG: 442 | Disposition: A | Discharge status: Home Health | Payer: Medicare Other | Attending: Internal Medicine | Admitting: Internal Medicine

## 2017-09-07 DIAGNOSIS — D6959 Other secondary thrombocytopenia: Secondary | ICD-10-CM | POA: Diagnosis present

## 2017-09-07 DIAGNOSIS — E785 Hyperlipidemia, unspecified: Secondary | ICD-10-CM | POA: Diagnosis present

## 2017-09-07 DIAGNOSIS — F17210 Nicotine dependence, cigarettes, uncomplicated: Secondary | ICD-10-CM | POA: Diagnosis present

## 2017-09-07 DIAGNOSIS — I480 Paroxysmal atrial fibrillation: Secondary | ICD-10-CM | POA: Diagnosis present

## 2017-09-07 DIAGNOSIS — Z950 Presence of cardiac pacemaker: Secondary | ICD-10-CM | POA: Diagnosis present

## 2017-09-07 DIAGNOSIS — Z96652 Presence of left artificial knee joint: Secondary | ICD-10-CM | POA: Diagnosis present

## 2017-09-07 DIAGNOSIS — K219 Gastro-esophageal reflux disease without esophagitis: Secondary | ICD-10-CM | POA: Diagnosis present

## 2017-09-07 DIAGNOSIS — D696 Thrombocytopenia, unspecified: Secondary | ICD-10-CM

## 2017-09-07 DIAGNOSIS — I2510 Atherosclerotic heart disease of native coronary artery without angina pectoris: Secondary | ICD-10-CM | POA: Diagnosis present

## 2017-09-07 DIAGNOSIS — K7460 Unspecified cirrhosis of liver: Secondary | ICD-10-CM | POA: Diagnosis present

## 2017-09-07 DIAGNOSIS — K7290 Hepatic failure, unspecified without coma: Secondary | ICD-10-CM | POA: Diagnosis not present

## 2017-09-07 DIAGNOSIS — Z23 Encounter for immunization: Secondary | ICD-10-CM

## 2017-09-07 DIAGNOSIS — B1920 Unspecified viral hepatitis C without hepatic coma: Secondary | ICD-10-CM | POA: Diagnosis present

## 2017-09-07 DIAGNOSIS — Z951 Presence of aortocoronary bypass graft: Secondary | ICD-10-CM

## 2017-09-07 DIAGNOSIS — I110 Hypertensive heart disease with heart failure: Secondary | ICD-10-CM | POA: Diagnosis present

## 2017-09-07 DIAGNOSIS — K7682 Hepatic encephalopathy: Secondary | ICD-10-CM | POA: Diagnosis present

## 2017-09-07 DIAGNOSIS — K7200 Acute and subacute hepatic failure without coma: Principal | ICD-10-CM | POA: Diagnosis present

## 2017-09-07 DIAGNOSIS — R42 Dizziness and giddiness: Secondary | ICD-10-CM | POA: Diagnosis not present

## 2017-09-07 DIAGNOSIS — I252 Old myocardial infarction: Secondary | ICD-10-CM

## 2017-09-07 DIAGNOSIS — I5032 Chronic diastolic (congestive) heart failure: Secondary | ICD-10-CM | POA: Diagnosis present

## 2017-09-07 LAB — CBC WITH DIFFERENTIAL/PLATELET
BASOS ABS: 0.1 10*3/uL (ref 0.0–0.1)
BASOS PCT: 1 %
Eosinophils Absolute: 0.2 10*3/uL (ref 0.0–0.7)
Eosinophils Relative: 4 %
HEMATOCRIT: 42.4 % (ref 39.0–52.0)
HEMOGLOBIN: 14.7 g/dL (ref 13.0–17.0)
LYMPHS PCT: 42 %
Lymphs Abs: 1.7 10*3/uL (ref 0.7–4.0)
MCH: 32.7 pg (ref 26.0–34.0)
MCHC: 34.7 g/dL (ref 30.0–36.0)
MCV: 94.4 fL (ref 78.0–100.0)
Monocytes Absolute: 0.7 10*3/uL (ref 0.1–1.0)
Monocytes Relative: 18 %
NEUTROS ABS: 1.5 10*3/uL — AB (ref 1.7–7.7)
NEUTROS PCT: 36 %
Platelets: 49 10*3/uL — ABNORMAL LOW (ref 150–400)
RBC: 4.49 MIL/uL (ref 4.22–5.81)
RDW: 14.1 % (ref 11.5–15.5)
WBC: 4.1 10*3/uL (ref 4.0–10.5)

## 2017-09-07 LAB — COMPREHENSIVE METABOLIC PANEL
ALK PHOS: 125 U/L (ref 38–126)
ALT: 27 U/L (ref 17–63)
AST: 48 U/L — AB (ref 15–41)
Albumin: 2.4 g/dL — ABNORMAL LOW (ref 3.5–5.0)
Anion gap: 5 (ref 5–15)
BUN: 6 mg/dL (ref 6–20)
CALCIUM: 8.3 mg/dL — AB (ref 8.9–10.3)
CO2: 23 mmol/L (ref 22–32)
CREATININE: 0.98 mg/dL (ref 0.61–1.24)
Chloride: 111 mmol/L (ref 101–111)
Glucose, Bld: 98 mg/dL (ref 65–99)
Potassium: 3.9 mmol/L (ref 3.5–5.1)
SODIUM: 139 mmol/L (ref 135–145)
Total Bilirubin: 1.7 mg/dL — ABNORMAL HIGH (ref 0.3–1.2)
Total Protein: 7.1 g/dL (ref 6.5–8.1)

## 2017-09-07 LAB — TROPONIN I: Troponin I: 0.05 ng/mL (ref <0.03)

## 2017-09-07 LAB — URINALYSIS, ROUTINE W REFLEX MICROSCOPIC
BACTERIA UA: NONE SEEN
Glucose, UA: NEGATIVE mg/dL
Ketones, ur: NEGATIVE mg/dL
Nitrite: NEGATIVE
PROTEIN: NEGATIVE mg/dL
SPECIFIC GRAVITY, URINE: 1.019 (ref 1.005–1.030)
pH: 7 (ref 5.0–8.0)

## 2017-09-07 LAB — AMMONIA: AMMONIA: 156 umol/L — AB (ref 9–35)

## 2017-09-07 MED ORDER — DEXTROSE 5 % IV SOLN
1.0000 g | Freq: Once | INTRAVENOUS | Status: AC
  Administered 2017-09-07: 1 g via INTRAVENOUS
  Filled 2017-09-07: qty 10

## 2017-09-07 MED ORDER — DEXTROSE 5 % IV SOLN
1.0000 g | Freq: Once | INTRAVENOUS | Status: AC
  Administered 2017-09-08: 1 g via INTRAVENOUS
  Filled 2017-09-07: qty 10

## 2017-09-07 MED ORDER — ONDANSETRON HCL 4 MG/2ML IJ SOLN: 4.0000 mg | Freq: Four times a day (QID) | INTRAMUSCULAR | Status: DC | PRN

## 2017-09-07 MED ORDER — METOPROLOL TARTRATE 50 MG PO TABS
50.0000 mg | ORAL_TABLET | Freq: Two times a day (BID) | ORAL | Status: DC
  Administered 2017-09-08 – 2017-09-09 (×4): 50 mg via ORAL
  Filled 2017-09-07 (×4): qty 1

## 2017-09-07 MED ORDER — ONDANSETRON HCL 4 MG PO TABS
4.0000 mg | ORAL_TABLET | Freq: Four times a day (QID) | ORAL | Status: DC | PRN
Start: 2017-09-07 — End: 2017-09-09

## 2017-09-07 MED ORDER — ALBUTEROL SULFATE (2.5 MG/3ML) 0.083% IN NEBU: 3.0000 mL | INHALATION_SOLUTION | RESPIRATORY_TRACT | Status: DC | PRN

## 2017-09-07 MED ORDER — ACETAMINOPHEN 650 MG RE SUPP: 650.0000 mg | Freq: Four times a day (QID) | RECTAL | Status: DC | PRN

## 2017-09-07 MED ORDER — LACTULOSE 10 GM/15ML PO SOLN
30.0000 g | Freq: Three times a day (TID) | ORAL | Status: DC
  Administered 2017-09-08 – 2017-09-09 (×5): 30 g via ORAL
  Filled 2017-09-07 (×6): qty 45

## 2017-09-07 MED ORDER — LOSARTAN POTASSIUM 25 MG PO TABS
25.0000 mg | ORAL_TABLET | Freq: Every day | ORAL | Status: DC
  Administered 2017-09-08 – 2017-09-09 (×2): 25 mg via ORAL
  Filled 2017-09-07 (×2): qty 1

## 2017-09-07 MED ORDER — SIMVASTATIN 20 MG PO TABS
20.0000 mg | ORAL_TABLET | Freq: Every day | ORAL | Status: DC
  Administered 2017-09-08: 20 mg via ORAL
  Filled 2017-09-07: qty 1

## 2017-09-07 MED ORDER — SODIUM CHLORIDE 0.9 % IV BOLUS (SEPSIS)
1000.0000 mL | Freq: Once | INTRAVENOUS | Status: AC
  Administered 2017-09-07: 1000 mL via INTRAVENOUS

## 2017-09-07 MED ORDER — ONDANSETRON HCL 4 MG/2ML IJ SOLN
4.0000 mg | Freq: Once | INTRAMUSCULAR | Status: AC
  Administered 2017-09-07: 4 mg via INTRAVENOUS
  Filled 2017-09-07: qty 2

## 2017-09-07 MED ORDER — DEXTROSE 5 % IV SOLN
2.0000 g | INTRAVENOUS | Status: DC
  Administered 2017-09-08: 2 g via INTRAVENOUS
  Filled 2017-09-07 (×2): qty 2

## 2017-09-07 MED ORDER — ACETAMINOPHEN 325 MG PO TABS: 650.0000 mg | ORAL_TABLET | Freq: Four times a day (QID) | ORAL | Status: DC | PRN

## 2017-09-07 MED ORDER — LACTULOSE 10 GM/15ML PO SOLN
20.0000 g | Freq: Once | ORAL | Status: AC
  Administered 2017-09-07: 20 g via ORAL
  Filled 2017-09-07: qty 30

## 2017-09-07 MED ORDER — MECLIZINE HCL 25 MG PO TABS
25.0000 mg | ORAL_TABLET | Freq: Once | ORAL | Status: AC
  Administered 2017-09-07: 25 mg via ORAL
  Filled 2017-09-07: qty 1

## 2017-09-07 NOTE — ED Triage Notes (Signed)
Per ems pt has been experiencing dizziness since last night. No sob, no nausea, no vomiting,no headache, no chest pain. 147/98, 68 p, 98% ra, cbg 114

## 2017-09-07 NOTE — Progress Notes (Signed)
Pharmacy Antibiotic Note  Sora Olivo is a 66 y.o. male admitted on 09/07/2017 with concern for SBP.  Pharmacy has been consulted for Rocephin dosing.  Plan: Rocephin 2g IV Q24H.  Height: 5\' 6"  (167.6 cm) Weight: 219 lb (99.3 kg) IBW/kg (Calculated) : 63.8  Recent Labs  Lab 09/07/17 1834 09/07/17 2118  WBC  --  4.1  CREATININE 0.98  --     Estimated Creatinine Clearance: 84 mL/min (by C-G formula based on SCr of 0.98 mg/dL).    No Known Allergies   Thank you for allowing pharmacy to be a part of this patient's care.  Wynona Neat, PharmD, BCPS  09/07/2017 11:09 PM

## 2017-09-07 NOTE — ED Notes (Signed)
Patient transported to X-ray 

## 2017-09-07 NOTE — H&P (Signed)
History and Physical    Hunter Carter XNA:[id number] DOB: Feb 22, 1951 DOA: 09/07/2017  PCP: Velna Hatchet, MD  Patient coming from: Home.  Chief Complaint: Dizziness and altered mental status.  HPI: Hunter Carter is a 66 y.o. male with history of CAD status post CABG, pacemaker placement, hepatitis C and cirrhosis of liver and chronic thrombocytopenia was brought to the ER after patient was found to have increasing dizziness over the last couple of days.  Patient also was noticed to have increasing confusion.  As per the patient's family patient has similar symptoms whenever he gets decompensated cirrhosis.  ED Course: In the ER patient is found to be nonfocal but confused.  CT head is unremarkable.  Lab work showed thrombocytopenia.  Ammonia was 156.  Patient was given lactulose p.o. and admitted for further observation.  At the time of my exam patient is oriented to his name and place.  But still lethargic and confused.  Moves all extremities.  Review of Systems: As per HPI, rest all negative.   Past Medical History:  Diagnosis Date  . Acute hepatic encephalopathy 10/10/2015   [name] 10/10/2015  . Arthritis    "joints ache" (10/10/2015)  . Bleeding stomach ulcer    "recently" (10/10/2015)  . CAD (coronary artery disease)   . CHF (congestive heart failure) ([place])   . Cirrhosis ([place])    due to HCV/notes 10/10/2015  . ETOH abuse   . GERD (gastroesophageal reflux disease)   . Heart block AV second degree    permanent pacemaker 02/2004  . Hepatitis C   . Hyperlipidemia   . Inferior MI ([place]) 2005  . Presence of permanent cardiac pacemaker   . RBBB   . Systemic hypertension   . Tubulovillous adenoma of colon   . Ventricular tachycardia (HCC)    nonsustained    Past Surgical History:  Procedure Laterality Date  . CARDIAC CATHETERIZATION  03/06/04   significant 3 vessel disease, totalled graft obtuse marginal  . CORONARY ARTERY BYPASS GRAFT  02/10/03   LIMA to LAD,SVG  to first diagonal, SVT to OM1  . ESOPHAGOGASTRODUODENOSCOPY N/A 07/29/2015   Procedure: ESOPHAGOGASTRODUODENOSCOPY (EGD);  Surgeon: Clarene Essex, MD;  Location: [facility] ENDOSCOPY;  Service: Endoscopy;  Laterality: N/A;  . INSERT / REPLACE / REMOVE PACEMAKER  03/2010   Medtronic  . KNEE ARTHROSCOPY Left [phone]  . MEDIASTINAL EXPLORATION  02/10/2003  . NM MYOCAR PERF WALL MOTION  04/05/09   low risk scan-abnormal-mod. perfusion defect basal inferoseptal,basal inferior,mid inferoseptal,mid inferior and apical inferior regions  . TOTAL KNEE ARTHROPLASTY Left   . US ECHOCARDIOGRAPHY  01/20/07   mild MR,mild to mod. TR,trace AI,mild PI     reports that he has been smoking cigarettes.  He has a 10.25 pack-year smoking history. he has never used smokeless tobacco. He reports that he drinks alcohol. He reports that he uses drugs. Drug: Marijuana.  No Known Allergies  Family History  Problem Relation Age of Onset  . Heart failure Mother   . Liver disease Father   . Heart failure Sister   . Hypertension Sister   . Hypertension Sister   . Colon cancer Neg Hx     Prior to Admission medications   Medication Sig Start Date End Date Taking? Authorizing Provider  furosemide (LASIX) 40 MG tablet Take 1 tablet (40 mg total) by mouth as needed. Please make appointment. 05/25/16  Yes Croitoru, Mihai, MD  albuterol (PROVENTIL HFA;VENTOLIN HFA) 108 (90 Base) MCG/ACT inhaler Inhale 2 puffs into the  lungs every 4 (four) hours as needed for wheezing or shortness of breath (or coughing). 26/9/48   Delora Fuel, MD  lactulose (CHRONULAC) 10 GM/15ML solution Take 30 ml by mouth twice daily    [provider]  losartan (COZAAR) 25 MG tablet Take 1 tablet (25 mg total) by mouth daily. 07/31/17 10/29/17  Barrett, [name], PA-C  metoprolol (LOPRESSOR) 50 MG tablet Take 1 tablet (50 mg total) by mouth 2 (two) times daily. 12/01/15   Croitoru, Mihai, MD  ondansetron (ZOFRAN ODT) 4 MG disintegrating tablet Take 1  tablet (4 mg total) by mouth every 8 (eight) hours as needed for nausea or vomiting. 02/15/17   Blanchie Dessert, MD  simvastatin (ZOCOR) 20 MG tablet Take 1 tablet (20 mg total) by mouth daily. Please make appointment. 05/25/16   Croitoru, Dani Gobble, MD    Physical Exam: Vitals:   09/07/17 1930 09/07/17 2030 09/07/17 2200 09/07/17 2230  BP: (!) 175/107 (!) 153/99 (!) 164/108 (!) 153/108  Pulse: 71 68 64 65  Resp: 17 20 14 17   SpO2: 96% 95% 93% 95%  Weight:      Height:          Constitutional: Appears normal.  Normal affect. Vitals:   09/07/17 1930 09/07/17 2030 09/07/17 2200 09/07/17 2230  BP: (!) 175/107 (!) 153/99 (!) 164/108 (!) 153/108  Pulse: 71 68 64 65  Resp: 17 20 14 17   SpO2: 96% 95% 93% 95%  Weight:      Height:       Eyes: Anicteric mild pallor. ENMT: No discharge from the ears eyes nose or mouth. Neck: No mass felt.  No neck rigidity. Respiratory: No rhonchi or crepitations. Cardiovascular: S1-S2 heard no murmurs appreciated. Abdomen: Soft nontender bowel sounds present.  Mildly distended. Musculoskeletal: No edema.  No joint effusion. Skin: No rash.  Skin appears warm. Neurologic: Mildly lethargic confused oriented to his name and place.  Moves all extremities. Psychiatric: Mildly confused.   Labs on Admission: I have personally reviewed following labs and imaging studies  CBC: Recent Labs  Lab 09/07/17 2118  WBC 4.1  NEUTROABS 1.5*  HGB 14.7  HCT 42.4  MCV 94.4  PLT 49*   Basic Metabolic Panel: Recent Labs  Lab 09/07/17 1834  NA 139  K 3.9  CL 111  CO2 23  GLUCOSE 98  BUN 6  CREATININE 0.98  CALCIUM 8.3*   GFR: Estimated Creatinine Clearance: 84 mL/min (by C-G formula based on SCr of 0.98 mg/dL). Liver Function Tests: Recent Labs  Lab 09/07/17 1834  AST 48*  ALT 27  ALKPHOS 125  BILITOT 1.7*  PROT 7.1  ALBUMIN 2.4*   No results for input(s): LIPASE, AMYLASE in the last 168 hours. Recent Labs  Lab 09/07/17 2020  AMMONIA 156*     Coagulation Profile: No results for input(s): INR, PROTIME in the last 168 hours. Cardiac Enzymes: Recent Labs  Lab 09/07/17 1834  TROPONINI 0.05*   BNP (last 3 results) No results for input(s): PROBNP in the last 8760 hours. HbA1C: No results for input(s): HGBA1C in the last 72 hours. CBG: No results for input(s): GLUCAP in the last 168 hours. Lipid Profile: No results for input(s): CHOL, HDL, LDLCALC, TRIG, CHOLHDL, LDLDIRECT in the last 72 hours. Thyroid Function Tests: No results for input(s): TSH, T4TOTAL, FREET4, T3FREE, THYROIDAB in the last 72 hours. Anemia Panel: No results for input(s): VITAMINB12, FOLATE, FERRITIN, TIBC, IRON, RETICCTPCT in the last 72 hours. Urine analysis:    Component  Value Date/Time   COLORURINE AMBER (A) 09/07/2017 1727   APPEARANCEUR CLOUDY (A) 09/07/2017 1727   LABSPEC 1.019 09/07/2017 1727   PHURINE 7.0 09/07/2017 1727   GLUCOSEU NEGATIVE 09/07/2017 1727   HGBUR SMALL (A) 09/07/2017 1727   BILIRUBINUR SMALL (A) 09/07/2017 1727   KETONESUR NEGATIVE 09/07/2017 1727   PROTEINUR NEGATIVE 09/07/2017 1727   NITRITE NEGATIVE 09/07/2017 1727   LEUKOCYTESUR TRACE (A) 09/07/2017 1727   Sepsis Labs: @LABRCNTIP (procalcitonin:4,lacticidven:4) )No results found for this or any previous visit (from the past 240 hour(s)).   Radiological Exams on Admission: Dg Chest 2 View  Result Date: 09/07/2017 CLINICAL DATA:  66 year old male with dizziness since last night. EXAM: CHEST  2 VIEW COMPARISON:  04/04/2017 FINDINGS: Stable postoperative changes with a left-sided pacer device in place. Cardiomediastinal silhouette is enlarged but stable. There are no focal parenchymal opacities, sizable pleural effusions or pneumothorax. No acute osseous abnormalities. IMPRESSION: Stable appearance of the chest with cardiomegaly and postoperative changes. No acute intrathoracic process. Electronically Signed   By: Kristopher Oppenheim M.D.   On: 09/07/2017 17:55   Ct Head  Wo Contrast  Result Date: 09/07/2017 CLINICAL DATA:  Dizziness and unexplained altered level of consciousness beginning last night. EXAM: CT HEAD WITHOUT CONTRAST TECHNIQUE: Contiguous axial images were obtained from the base of the skull through the vertex without intravenous contrast. COMPARISON:  None. FINDINGS: Brain: No evidence of acute infarction, hemorrhage, hydrocephalus, extra-axial collection, or mass lesion/mass effect. Mild to moderate chronic small vessel disease. Vascular:  No hyperdense vessel or other acute findings. Skull: No evidence of fracture or other significant bone abnormality. Sinuses/Orbits:  No acute findings. Other: None. IMPRESSION: No acute intracranial abnormality. Mild to moderate chronic small vessel disease. Electronically Signed   By: Earle Gell M.D.   On: 09/07/2017 19:15     Assessment/Plan Principal Problem:   Hepatic encephalopathy [facility]) Active Problems:   CAD s/p CABG 2004   Pacemaker - dual chamber Medtronic 2011   Chronic diastolic congestive heart failure (HCC)   Paroxysmal atrial fibrillation ([place])   Cirrhosis ([place])    1. Hepatic encephalopathy -I have placed patient on lactulose 30 g p.o. 3 times daily along with ceftriaxone for SBP coverage.  We will also order ultrasound-guided paracentesis in the morning to check for SBP. 2. Weakness and dizziness -we will hold Lasix for now get physical therapy consult. 3. Hypertension uncontrolled -in addition to patient's home dose of Cozaar and metoprolol I have placed patient on as needed IV hydralazine. 4. Chronic thrombocytopenia likely from cirrhosis -follow CBC. 5. CAD status post CABG -denies any chest pain. 6. History of pacemaker placement. 7. Possible UTI on ceftriaxone.  Follow urine cultures.   DVT prophylaxis: SCDs.  Code Status: Full code. Family Communication: Discussed with patient. Disposition Plan: Home. Consults called: Physical therapy. Admission status: Observation.   Rise Patience MD [facility] Pager [phone].  If 7PM-7AM, please contact night-coverage [email or web address] Password [email or web address]  09/07/2017, 11:02 PM

## 2017-09-07 NOTE — Progress Notes (Signed)
Attempted report from ED x 1.  Earleen Reaper RN-BC, [employer]

## 2017-09-07 NOTE — ED Notes (Signed)
Attempted report x1. 

## 2017-09-07 NOTE — ED Notes (Signed)
Patient transported to CT 

## 2017-09-07 NOTE — ED Provider Notes (Signed)
[facility] EMERGENCY DEPARTMENT Provider Note   CSN: [id number] Arrival date & time: 09/07/17  1700     History   Chief Complaint Chief Complaint  Patient presents with  . Dizziness    HPI Hunter Carter is a 66 y.o. male.  Pt presents to the ED today with dizziness.  Pt told the nurse sx started last night.  He told me a few hours ago.  He is not able to describe how he feels very well, but said he feels "woozy."  He denies any vomiting.  He said it's worse with head movement and standing up.  Pt denies any pain.  Pt's family here and able to give more history.  He has been acting strangely the last few days, worse today.  His sister said he "was talking out of his head."  The pt has a hx of hepatitis C and cirrhosis.  She said that when he acts strangely, it is usually because his ammonia level is elevated.   They are also concerned that he is unable to take care of himself at home.      Past Medical History:  Diagnosis Date  . Acute hepatic encephalopathy 10/10/2015   [name] 10/10/2015  . Arthritis    "joints ache" (10/10/2015)  . Bleeding stomach ulcer    "recently" (10/10/2015)  . CAD (coronary artery disease)   . CHF (congestive heart failure) ([place])   . Cirrhosis ([place])    due to HCV/notes 10/10/2015  . ETOH abuse   . GERD (gastroesophageal reflux disease)   . Heart block AV second degree    permanent pacemaker 02/2004  . Hepatitis C   . Hyperlipidemia   . Inferior MI ([place]) 2005  . Presence of permanent cardiac pacemaker   . RBBB   . Systemic hypertension   . Tubulovillous adenoma of colon   . Ventricular tachycardia ([place])    nonsustained    Patient Active Problem List   Diagnosis Date Noted  . Cirrhosis ([place]) 08/08/2016  . Paroxysmal atrial fibrillation ([place]) 01/05/2016  . Tobacco abuse 01/05/2016  . Hepatic encephalopathy ([place]) 10/10/2015  . Pain, joint, ankle, left 10/10/2015  . Thrombocytopathia ([place]) 10/10/2015  . HLD  (hyperlipidemia) 10/10/2015  . Chronic diastolic congestive heart failure ([place]) 10/10/2015  . Altered mental status   . Increased ammonia level   . Leg swelling   . Encephalopathy acute 08/01/2015  . GI bleed 07/29/2015  . Acute GI bleeding 07/29/2015  . Acute respiratory failure ([place])   . Bleeding gastrointestinal   . Hemorrhagic shock ([place])   . CAD s/p CABG 2004 07/20/2013  . Pacemaker - dual chamber Medtronic 2011 07/20/2013  . Hepatitis C 07/20/2013  . Hyperlipidemia 07/20/2013  . PAT (paroxysmal atrial tachycardia) ([place]) 07/20/2013  . Obesity (BMI 30-39.9) 07/20/2013  . NSVT (nonsustained ventricular tachycardia) ([place]) 07/20/2013  . HTN (hypertension) 07/16/2013    Past Surgical History:  Procedure Laterality Date  . CARDIAC CATHETERIZATION  03/06/04   significant 3 vessel disease, totalled graft obtuse marginal  . CORONARY ARTERY BYPASS GRAFT  02/10/03   LIMA to LAD,SVG to first diagonal, SVT to OM1  . ESOPHAGOGASTRODUODENOSCOPY N/A 07/29/2015   Procedure: ESOPHAGOGASTRODUODENOSCOPY (EGD);  Surgeon: Clarene Essex, MD;  Location: [facility] ENDOSCOPY;  Service: Endoscopy;  Laterality: N/A;  . INSERT / REPLACE / REMOVE PACEMAKER  03/2010   Medtronic  . KNEE ARTHROSCOPY Left [phone]  . MEDIASTINAL EXPLORATION  02/10/2003  . NM MYOCAR PERF WALL MOTION  04/05/09  low risk scan-abnormal-mod. perfusion defect basal inferoseptal,basal inferior,mid inferoseptal,mid inferior and apical inferior regions  . TOTAL KNEE ARTHROPLASTY Left   . US ECHOCARDIOGRAPHY  01/20/07   mild MR,mild to mod. TR,trace AI,mild PI       Home Medications    Prior to Admission medications   Medication Sig Start Date End Date Taking? Authorizing Provider  albuterol (PROVENTIL HFA;VENTOLIN HFA) 108 (90 Base) MCG/ACT inhaler Inhale 2 puffs into the lungs every 4 (four) hours as needed for wheezing or shortness of breath (or coughing). 16/0/10   Delora Fuel, MD  furosemide (LASIX) 40 MG tablet Take 1 tablet  (40 mg total) by mouth as needed. Please make appointment. 05/25/16   Croitoru, Mihai, MD  lactulose (CHRONULAC) 10 GM/15ML solution Take 30 ml by mouth twice daily    [provider]  losartan (COZAAR) 25 MG tablet Take 1 tablet (25 mg total) by mouth daily. 07/31/17 10/29/17  Barrett, [name], PA-C  metoprolol (LOPRESSOR) 50 MG tablet Take 1 tablet (50 mg total) by mouth 2 (two) times daily. 12/01/15   Croitoru, Mihai, MD  ondansetron (ZOFRAN ODT) 4 MG disintegrating tablet Take 1 tablet (4 mg total) by mouth every 8 (eight) hours as needed for nausea or vomiting. 02/15/17   Blanchie Dessert, MD  simvastatin (ZOCOR) 20 MG tablet Take 1 tablet (20 mg total) by mouth daily. Please make appointment. 05/25/16   Croitoru, Dani Gobble, MD    Family History Family History  Problem Relation Age of Onset  . Heart failure Mother   . Liver disease Father   . Heart failure Sister   . Hypertension Sister   . Hypertension Sister   . Colon cancer Neg Hx     Social History Social History   Tobacco Use  . Smoking status: Current Every Day Smoker    Packs/day: 0.25    Years: 41.00    Pack years: 10.25    Types: Cigarettes  . Smokeless tobacco: Never Used  Substance Use Topics  . Alcohol use: Yes    Comment: 10/10/2015 "last drink was 2-3 months ago; h/o abuse"  . Drug use: Yes    Types: Marijuana    Comment: last use 2015     Allergies   Patient has no known allergies.   Review of Systems Review of Systems  Neurological: Positive for dizziness.  All other systems reviewed and are negative.    Physical Exam Updated Vital Signs BP (!) 154/96   Pulse (!) 59   Resp (!) 21   Ht 5\' 6"  (1.676 m)   Wt 99.3 kg (219 lb)   SpO2 100%   BMI 35.35 kg/m   Physical Exam  Constitutional: He appears well-developed and well-nourished.  HENT:  Head: Normocephalic and atraumatic.  Right Ear: External ear normal.  Left Ear: External ear normal.  Nose: Nose normal.  Mouth/Throat:  Oropharynx is clear and moist.  Eyes: Conjunctivae and EOM are normal. Pupils are equal, round, and reactive to light.  Neck: Normal range of motion.  Cardiovascular: Normal rate, regular rhythm, normal heart sounds and intact distal pulses.  Pulmonary/Chest: Effort normal and breath sounds normal.  Abdominal: Soft. Bowel sounds are normal.  Musculoskeletal: Normal range of motion.  Neurological: He is alert.  Pt knows his name and where he is, but forgets what he is saying mid sentence.  Pt urinated all over the floor and seems confused.  Skin: Skin is warm. Capillary refill takes less than 2 seconds.  Psychiatric: He has  a normal mood and affect. His behavior is normal. Judgment and thought content normal.  Nursing note and vitals reviewed.    ED Treatments / Results  Labs (all labs ordered are listed, but only abnormal results are displayed) Labs Reviewed  COMPREHENSIVE METABOLIC PANEL - Abnormal; Notable for the following components:      Result Value   Calcium 8.3 (*)    Albumin 2.4 (*)    AST 48 (*)    Total Bilirubin 1.7 (*)    All other components within normal limits  TROPONIN I - Abnormal; Notable for the following components:   Troponin I 0.05 (*)    All other components within normal limits  URINALYSIS, ROUTINE W REFLEX MICROSCOPIC - Abnormal; Notable for the following components:   Color, Urine AMBER (*)    APPearance CLOUDY (*)    Hgb urine dipstick SMALL (*)    Bilirubin Urine SMALL (*)    Leukocytes, UA TRACE (*)    Squamous Epithelial / LPF 0-5 (*)    All other components within normal limits  AMMONIA - Abnormal; Notable for the following components:   Ammonia 156 (*)    All other components within normal limits  CBC WITH DIFFERENTIAL/PLATELET  CBC WITH DIFFERENTIAL/PLATELET    EKG  EKG Interpretation  Date/Time:  Saturday September 07 2017 17:16:48 EST Ventricular Rate:  76 PR Interval:    QRS Duration: 135 QT Interval:  432 QTC Calculation: 486 R  Axis:   31 Text Interpretation:  Sinus rhythm Atrial premature complex Probable left atrial enlargement Right bundle branch block Inferior infarct, old No significant change since last tracing Confirmed by Isla Pence [phone]) on 09/07/2017 5:30:39 PM       Radiology Dg Chest 2 View  Result Date: 09/07/2017 CLINICAL DATA:  66 year old male with dizziness since last night. EXAM: CHEST  2 VIEW COMPARISON:  04/04/2017 FINDINGS: Stable postoperative changes with a left-sided pacer device in place. Cardiomediastinal silhouette is enlarged but stable. There are no focal parenchymal opacities, sizable pleural effusions or pneumothorax. No acute osseous abnormalities. IMPRESSION: Stable appearance of the chest with cardiomegaly and postoperative changes. No acute intrathoracic process. Electronically Signed   By: Kristopher Oppenheim M.D.   On: 09/07/2017 17:55   Ct Head Wo Contrast  Result Date: 09/07/2017 CLINICAL DATA:  Dizziness and unexplained altered level of consciousness beginning last night. EXAM: CT HEAD WITHOUT CONTRAST TECHNIQUE: Contiguous axial images were obtained from the base of the skull through the vertex without intravenous contrast. COMPARISON:  None. FINDINGS: Brain: No evidence of acute infarction, hemorrhage, hydrocephalus, extra-axial collection, or mass lesion/mass effect. Mild to moderate chronic small vessel disease. Vascular:  No hyperdense vessel or other acute findings. Skull: No evidence of fracture or other significant bone abnormality. Sinuses/Orbits:  No acute findings. Other: None. IMPRESSION: No acute intracranial abnormality. Mild to moderate chronic small vessel disease. Electronically Signed   By: Earle Gell M.D.   On: 09/07/2017 19:15    Procedures Procedures (including critical care time)  Medications Ordered in ED Medications  cefTRIAXone (ROCEPHIN) 1 g in dextrose 5 % 50 mL IVPB (not administered)  sodium chloride 0.9 % bolus 1,000 mL (0 mLs Intravenous  Stopped 09/07/17 1930)  ondansetron (ZOFRAN) injection 4 mg (4 mg Intravenous Given 09/07/17 1816)  meclizine (ANTIVERT) tablet 25 mg (25 mg Oral Given 09/07/17 1816)  lactulose (CHRONULAC) 10 GM/15ML solution 20 g (20 g Oral Given 09/07/17 2146)     Initial Impression / Assessment and Plan / ED  Course  I have reviewed the triage vital signs and the nursing notes.  Pertinent labs & imaging results that were available during my care of the patient were reviewed by me and considered in my medical decision making (see chart for details).  Pt given lactulose which he was able to swallow if the nurse held it up to his lips.  Pt d/w Dr. Hal Hope ([facility]) for admission.    Final Clinical Impressions(s) / ED Diagnoses   Final diagnoses:  Hepatic encephalopathy [facility])    ED Discharge Orders    None       Isla Pence, MD 09/07/17 2149

## 2017-09-07 NOTE — ED Notes (Signed)
Pt's family requesting to speak to MD.  MD came to bedside and answered questions.

## 2017-09-08 ENCOUNTER — Inpatient Hospital Stay (HOSPITAL_COMMUNITY): Payer: Medicare Other

## 2017-09-08 ENCOUNTER — Encounter (HOSPITAL_COMMUNITY): Payer: Self-pay | Admitting: Radiology

## 2017-09-08 ENCOUNTER — Other Ambulatory Visit: Payer: Self-pay

## 2017-09-08 DIAGNOSIS — Z950 Presence of cardiac pacemaker: Secondary | ICD-10-CM | POA: Diagnosis not present

## 2017-09-08 DIAGNOSIS — Z951 Presence of aortocoronary bypass graft: Secondary | ICD-10-CM | POA: Diagnosis not present

## 2017-09-08 DIAGNOSIS — B182 Chronic viral hepatitis C: Secondary | ICD-10-CM

## 2017-09-08 DIAGNOSIS — I5032 Chronic diastolic (congestive) heart failure: Secondary | ICD-10-CM | POA: Diagnosis present

## 2017-09-08 DIAGNOSIS — K219 Gastro-esophageal reflux disease without esophagitis: Secondary | ICD-10-CM | POA: Diagnosis present

## 2017-09-08 DIAGNOSIS — K7290 Hepatic failure, unspecified without coma: Secondary | ICD-10-CM | POA: Diagnosis not present

## 2017-09-08 DIAGNOSIS — K7469 Other cirrhosis of liver: Secondary | ICD-10-CM | POA: Diagnosis not present

## 2017-09-08 DIAGNOSIS — K7460 Unspecified cirrhosis of liver: Secondary | ICD-10-CM | POA: Diagnosis present

## 2017-09-08 DIAGNOSIS — D696 Thrombocytopenia, unspecified: Secondary | ICD-10-CM | POA: Diagnosis not present

## 2017-09-08 DIAGNOSIS — B1920 Unspecified viral hepatitis C without hepatic coma: Secondary | ICD-10-CM | POA: Diagnosis present

## 2017-09-08 DIAGNOSIS — K7200 Acute and subacute hepatic failure without coma: Secondary | ICD-10-CM | POA: Diagnosis present

## 2017-09-08 DIAGNOSIS — Z23 Encounter for immunization: Secondary | ICD-10-CM | POA: Diagnosis present

## 2017-09-08 DIAGNOSIS — I252 Old myocardial infarction: Secondary | ICD-10-CM | POA: Diagnosis not present

## 2017-09-08 DIAGNOSIS — I2510 Atherosclerotic heart disease of native coronary artery without angina pectoris: Secondary | ICD-10-CM | POA: Diagnosis present

## 2017-09-08 DIAGNOSIS — I110 Hypertensive heart disease with heart failure: Secondary | ICD-10-CM | POA: Diagnosis present

## 2017-09-08 DIAGNOSIS — E785 Hyperlipidemia, unspecified: Secondary | ICD-10-CM | POA: Diagnosis present

## 2017-09-08 DIAGNOSIS — I25118 Atherosclerotic heart disease of native coronary artery with other forms of angina pectoris: Secondary | ICD-10-CM | POA: Diagnosis not present

## 2017-09-08 DIAGNOSIS — R42 Dizziness and giddiness: Secondary | ICD-10-CM | POA: Diagnosis present

## 2017-09-08 DIAGNOSIS — I480 Paroxysmal atrial fibrillation: Secondary | ICD-10-CM | POA: Diagnosis present

## 2017-09-08 DIAGNOSIS — F17210 Nicotine dependence, cigarettes, uncomplicated: Secondary | ICD-10-CM | POA: Diagnosis present

## 2017-09-08 DIAGNOSIS — Z96652 Presence of left artificial knee joint: Secondary | ICD-10-CM | POA: Diagnosis present

## 2017-09-08 DIAGNOSIS — D6959 Other secondary thrombocytopenia: Secondary | ICD-10-CM | POA: Diagnosis present

## 2017-09-08 LAB — HEPATIC FUNCTION PANEL
ALBUMIN: 2.3 g/dL — AB (ref 3.5–5.0)
ALT: 29 U/L (ref 17–63)
AST: 48 U/L — AB (ref 15–41)
Alkaline Phosphatase: 123 U/L (ref 38–126)
BILIRUBIN DIRECT: 0.5 mg/dL (ref 0.1–0.5)
Indirect Bilirubin: 1.4 mg/dL — ABNORMAL HIGH (ref 0.3–0.9)
TOTAL PROTEIN: 7.2 g/dL (ref 6.5–8.1)
Total Bilirubin: 1.9 mg/dL — ABNORMAL HIGH (ref 0.3–1.2)

## 2017-09-08 LAB — BASIC METABOLIC PANEL
Anion gap: 6 (ref 5–15)
BUN: 5 mg/dL — AB (ref 6–20)
CALCIUM: 8.2 mg/dL — AB (ref 8.9–10.3)
CO2: 23 mmol/L (ref 22–32)
CREATININE: 0.73 mg/dL (ref 0.61–1.24)
Chloride: 110 mmol/L (ref 101–111)
GFR calc Af Amer: >60 mL/min (ref >60)
Glucose, Bld: 81 mg/dL (ref 65–99)
Potassium: 3.5 mmol/L (ref 3.5–5.1)
SODIUM: 139 mmol/L (ref 135–145)

## 2017-09-08 LAB — CBC
HCT: 44.6 % (ref 39.0–52.0)
Hemoglobin: 15.7 g/dL (ref 13.0–17.0)
MCH: 32.9 pg (ref 26.0–34.0)
MCHC: 35.2 g/dL (ref 30.0–36.0)
MCV: 93.5 fL (ref 78.0–100.0)
PLATELETS: 46 10*3/uL — AB (ref 150–400)
RBC: 4.77 MIL/uL (ref 4.22–5.81)
RDW: 14.1 % (ref 11.5–15.5)
WBC: 3.7 10*3/uL — AB (ref 4.0–10.5)

## 2017-09-08 LAB — AMMONIA: Ammonia: 135 umol/L — ABNORMAL HIGH (ref 9–35)

## 2017-09-08 LAB — HIV ANTIBODY (ROUTINE TESTING W REFLEX): HIV SCREEN 4TH GENERATION: NONREACTIVE

## 2017-09-08 MED ORDER — INFLUENZA VAC SPLIT QUAD 0.5 ML IM SUSY
0.5000 mL | PREFILLED_SYRINGE | Freq: Once | INTRAMUSCULAR | Status: AC
  Administered 2017-09-08: 0.5 mL via INTRAMUSCULAR
  Filled 2017-09-08: qty 0.5

## 2017-09-08 MED ORDER — HYDRALAZINE HCL 20 MG/ML IJ SOLN
10.0000 mg | INTRAMUSCULAR | Status: DC | PRN
  Administered 2017-09-08: 10 mg via INTRAVENOUS
  Filled 2017-09-08: qty 1

## 2017-09-08 MED ORDER — IOPAMIDOL (ISOVUE-300) INJECTION 61%
INTRAVENOUS | Status: AC
  Administered 2017-09-08: 100 mL
  Filled 2017-09-08: qty 100

## 2017-09-08 NOTE — Evaluation (Signed)
Physical Therapy Evaluation Patient Details Name: Hunter Carter MRN: [id number] DOB: 05-Apr-1951 Today's Date: 09/08/2017   History of Present Illness  Pt is a 66 y.o. male with history of CAD status post CABG, pacemaker placement, hepatitis C, cirrhosis of liver and chronic thrombocytopenia. He presented to the ED with dizziness and AMS. Labs revealed increased ammonia level.  Pt admitted with dx of hepatic encephalopathy.   Clinical Impression  Pt admitted with above diagnosis. Pt currently with functional limitations due to the deficits listed below (see PT Problem List). On eval, pt required min guard assist for transfers and ambulation 150 feet without AD. No c/o dizziness during eval. Pt lethargic and slow to respond with answers to questions and initiation of mobility. His decreased cognitive status and balance deficits put him at high risk for falls. Repetitive verbal cues needed for pt to complete simple one-step tasks. After walking into the bathroom, pt first attempted to urinate in the shower. Max cueing required to redirect pt to the toilet. Pt will benefit from skilled PT to increase their independence and safety with mobility to allow discharge to the venue listed below.  If pt does not qualify for SNF, 24-hour home assist and HHPT would be needed.      Follow Up Recommendations SNF;Supervision/Assistance - 24 hour    Equipment Recommendations  None recommended by PT    Recommendations for Other Services       Precautions / Restrictions Precautions Precautions: Fall Restrictions Weight Bearing Restrictions: No      Mobility  Bed Mobility Overal bed mobility: Modified Independent             General bed mobility comments: +rail, increased time and effort  Transfers Overall transfer level: Needs assistance Equipment used: None Transfers: Sit to/from Stand;Stand Pivot Transfers Sit to Stand: Min guard Stand pivot transfers: Min guard       General  transfer comment: verbal cues for sequencing, min guard for safety, increased time to stabilize initial standing balance  Ambulation/Gait Ambulation/Gait assistance: Min guard Ambulation Distance (Feet): 150 Feet Assistive device: None Gait Pattern/deviations: Step-through pattern;Shuffle Gait velocity: decreased Gait velocity interpretation: Below normal speed for age/gender General Gait Details: slow, shuffle gait. Pt fatigues quickly  Stairs            Wheelchair Mobility    Modified Rankin (Stroke Patients Only)       Balance Overall balance assessment: Needs assistance Sitting-balance support: No upper extremity supported;Feet supported Sitting balance-Leahy Scale: Good     Standing balance support: No upper extremity supported;During functional activity Standing balance-Leahy Scale: Fair                               Pertinent Vitals/Pain Pain Assessment: No/denies pain    Home Living Family/patient expects to be discharged to:: Private residence Living Arrangements: Alone Available Help at Discharge: Family;Available PRN/intermittently Type of Home: Apartment Home Access: Level entry     Home Layout: One level Home Equipment: Cane - single point      Prior Function Level of Independence: Independent         Comments: Lives alone. Walks to the store for groceries.     Hand Dominance        Extremity/Trunk Assessment   Upper Extremity Assessment Upper Extremity Assessment: Overall WFL for tasks assessed    Lower Extremity Assessment Lower Extremity Assessment: Overall WFL for tasks assessed    Cervical / Trunk  Assessment Cervical / Trunk Assessment: Normal  Communication   Communication: No difficulties  Cognition Arousal/Alertness: Lethargic Behavior During Therapy: Flat affect Overall Cognitive Status: Impaired/Different from baseline Area of Impairment: Attention;Memory;Following commands;Safety/judgement;Problem  solving                   Current Attention Level: Sustained Memory: Decreased short-term memory Following Commands: Follows one step commands with increased time Safety/Judgement: Decreased awareness of safety   Problem Solving: Slow processing;Requires verbal cues General Comments: Repetitive verbal cues required for all tasks.      General Comments      Exercises     Assessment/Plan    PT Assessment Patient needs continued PT services  PT Problem List Decreased mobility;Decreased safety awareness;Decreased knowledge of precautions;Decreased activity tolerance;Decreased balance       PT Treatment Interventions Therapeutic activities;Gait training;Therapeutic exercise;Patient/family education;Balance training;Functional mobility training    PT Goals (Current goals can be found in the Care Plan section)  Acute Rehab PT Goals Patient Stated Goal: not stated PT Goal Formulation: Patient unable to participate in goal setting Time For Goal Achievement: 09/22/17 Potential to Achieve Goals: Fair    Frequency Min 3X/week   Barriers to discharge Decreased caregiver support      Co-evaluation               AM-PAC PT "6 Clicks" Daily Activity  Outcome Measure Difficulty turning over in bed (including adjusting bedclothes, sheets and blankets)?: A Little Difficulty moving from lying on back to sitting on the side of the bed? : A Little Difficulty sitting down on and standing up from a chair with arms (e.g., wheelchair, bedside commode, etc,.)?: A Little Help needed moving to and from a bed to chair (including a wheelchair)?: A Little Help needed walking in hospital room?: A Little Help needed climbing 3-5 steps with a railing? : A Little 6 Click Score: 18    End of Session Equipment Utilized During Treatment: Gait belt Activity Tolerance: Patient tolerated treatment well Patient left: in bed;with call bell/phone within reach;with family/visitor present Nurse  Communication: Mobility status PT Visit Diagnosis: Other abnormalities of gait and mobility (R26.89)    Time: 3086-5784 PT Time Calculation (min) (ACUTE ONLY): 26 min   Charges:   PT Evaluation $PT Eval Moderate Complexity: 1 Mod PT Treatments $Gait Training: 8-22 mins   PT G Codes:   PT G-Codes **NOT FOR INPATIENT CLASS** Functional Assessment Tool Used: AM-PAC 6 Clicks Basic Mobility Functional Limitation: Mobility: Walking and moving around Mobility: Walking and Moving Around Current Status (O9629): At least 40 percent but less than 60 percent impaired, limited or restricted Mobility: Walking and Moving Around Goal Status [phone]): At least 20 percent but less than 40 percent impaired, limited or restricted    Lorrin Goodell, PT  Office # [phone] Pager [phone]   Lorriane Shire 09/08/2017, 11:01 AM

## 2017-09-08 NOTE — Progress Notes (Signed)
PROGRESS NOTE [facility]   Hunter Carter   WUJ:[id number] DOB: 1950/12/13  DOA: 09/07/2017 PCP: Velna Hatchet, MD   Brief Narrative:  Hunter Carter is a 66 year old male with a medical history of CAD status post CABG, hepatitis C and liver cirrhosis, chronic thrombocytopenia who came to the ED after family find the patient  acting funny and confused.  In the ED patient was found to be confused but nonfocal CT of the head was unremarkable and lab workup which showed ammonia of 156.  Patient was given lactulose p.o. and admitted for further management.  Patient with a working diagnosis of hepatic encephalopathy.  Subjective: Patient seen and examined continues to be confused, although awake and interactive.  Follows commands and is oriented to person and place only.  Patient reports his abdomen feels more distended and denies abdominal pain nausea, vomiting and diarrhea.  No chest pain or shortness of breath  Assessment & Plan: Hepatic encephalopathy  Patient with known history of cirrhosis, was placed on lactulose 30 g p.o. 3 times daily along with several triads on for SBP coverage. With abdominal distention will get CT abd and pelvis, if shows ascites, get paracentesis. Check Ammonia levels in AM. CT head was negative.   HTN  BP slight above goal  On losartan and metoprolol - continue to monitor for now If BP remains an issue consider adding Aldactone  Hydralazine PRN   CAD w CABG  Stable, asymptomatic  Chronic thrombocytopenia from cirrhosis  Avoid anticoagulation  Monitor CBC   Hepatitis C  Not on treatment  Check viral load  Treatment as outpatient   Deconditioning  PT evaluated patient recommending SNF.   DVT prophylaxis: SCD's  Code Status: Full Code  Family Communication: Son at bedside  Disposition Plan: PT recommending SNF   Consultants:   None   Procedures:   None   Antimicrobials:  Ceftriaxone 11/24 >>     Objective: Vitals:   09/07/17 2330 09/08/17 0306 09/08/17 0534 09/08/17 0930  BP: (!) 178/103 136/84 (!) 132/91 (!) 143/79  Pulse: 81 60 61 64  Resp: 16 16 16 16   Temp: 97.6 F (36.4 C) 98.1 F (36.7 C) 97.9 F (36.6 C) 98.6 F (37 C)  TempSrc: Oral Oral Oral Oral  SpO2: 100% 100% 100% 100%  Weight: 118.4 kg (261 lb 0.4 oz)     Height: 5\' 6"  (1.676 m)       Intake/Output Summary (Last 24 hours) at 09/08/2017 1329 Last data filed at 09/08/2017 0600 Gross per 24 hour  Intake 290 ml  Output 850 ml  Net -560 ml   Filed Weights   09/07/17 1709 09/07/17 2330  Weight: 99.3 kg (219 lb) 118.4 kg (261 lb 0.4 oz)    Examination:  General exam: NAD  HEENT: No scleral icterus, No LAD  Respiratory system: Clear to auscultation. No wheezes,crackle or rhonchi Cardiovascular system: S1 & S2 heard, RRR. No JVD, murmurs, rubs or gallops Gastrointestinal system: Abdomen distended, semi-firm, unable to identify fluid wave, +BS, no masses  Central nervous system: Oriented to person and place only, follow commands. Non focal  Extremities: No LE edema, no asterixis  Skin: No rashes, lesions or ulcers  Data Reviewed: I have personally reviewed following labs and imaging studies  CBC: Recent Labs  Lab 09/07/17 2118 09/08/17 0500  WBC 4.1 3.7*  NEUTROABS 1.5*  --   HGB 14.7 15.7  HCT 42.4 44.6  MCV 94.4 93.5  PLT 49* 46*   Basic Metabolic Panel:  Recent Labs  Lab 09/07/17 1834 09/08/17 0500  NA 139 139  K 3.9 3.5  CL 111 110  CO2 23 23  GLUCOSE 98 81  BUN 6 5*  CREATININE 0.98 0.73  CALCIUM 8.3* 8.2*   GFR: Estimated Creatinine Clearance: 112.9 mL/min (by C-G formula based on SCr of 0.73 mg/dL). Liver Function Tests: Recent Labs  Lab 09/07/17 1834 09/08/17 0500  AST 48* 48*  ALT 27 29  ALKPHOS 125 123  BILITOT 1.7* 1.9*  PROT 7.1 7.2  ALBUMIN 2.4* 2.3*   No results for input(s): LIPASE, AMYLASE in the last 168 hours. Recent Labs  Lab 09/07/17 2020 09/08/17 0500  AMMONIA 156* 135*     Coagulation Profile: No results for input(s): INR, PROTIME in the last 168 hours. Cardiac Enzymes: Recent Labs  Lab 09/07/17 1834  TROPONINI 0.05*   BNP (last 3 results) No results for input(s): PROBNP in the last 8760 hours. HbA1C: No results for input(s): HGBA1C in the last 72 hours. CBG: No results for input(s): GLUCAP in the last 168 hours. Lipid Profile: No results for input(s): CHOL, HDL, LDLCALC, TRIG, CHOLHDL, LDLDIRECT in the last 72 hours. Thyroid Function Tests: No results for input(s): TSH, T4TOTAL, FREET4, T3FREE, THYROIDAB in the last 72 hours. Anemia Panel: No results for input(s): VITAMINB12, FOLATE, FERRITIN, TIBC, IRON, RETICCTPCT in the last 72 hours. Sepsis Labs: No results for input(s): PROCALCITON, LATICACIDVEN in the last 168 hours.  No results found for this or any previous visit (from the past 240 hour(s)).    Radiology Studies: Dg Chest 2 View  Result Date: 09/07/2017 CLINICAL DATA:  66 year old male with dizziness since last night. EXAM: CHEST  2 VIEW COMPARISON:  04/04/2017 FINDINGS: Stable postoperative changes with a left-sided pacer device in place. Cardiomediastinal silhouette is enlarged but stable. There are no focal parenchymal opacities, sizable pleural effusions or pneumothorax. No acute osseous abnormalities. IMPRESSION: Stable appearance of the chest with cardiomegaly and postoperative changes. No acute intrathoracic process. Electronically Signed   By: Kristopher Oppenheim M.D.   On: 09/07/2017 17:55   Ct Head Wo Contrast  Result Date: 09/07/2017 CLINICAL DATA:  Dizziness and unexplained altered level of consciousness beginning last night. EXAM: CT HEAD WITHOUT CONTRAST TECHNIQUE: Contiguous axial images were obtained from the base of the skull through the vertex without intravenous contrast. COMPARISON:  None. FINDINGS: Brain: No evidence of acute infarction, hemorrhage, hydrocephalus, extra-axial collection, or mass lesion/mass effect. Mild  to moderate chronic small vessel disease. Vascular:  No hyperdense vessel or other acute findings. Skull: No evidence of fracture or other significant bone abnormality. Sinuses/Orbits:  No acute findings. Other: None. IMPRESSION: No acute intracranial abnormality. Mild to moderate chronic small vessel disease. Electronically Signed   By: Earle Gell M.D.   On: 09/07/2017 19:15    Scheduled Meds: . lactulose  30 g Oral TID  . losartan  25 mg Oral Daily  . metoprolol tartrate  50 mg Oral BID  . simvastatin  20 mg Oral q1800   Continuous Infusions: . cefTRIAXone (ROCEPHIN)  IV       LOS: 0 days    Time spent: Total of 25 minutes spent with pt, greater than 50% of which was spent in discussion of  treatment, counseling and coordination of care   Chipper Oman, MD Pager: Text Page via [email or web address]   If 7PM-7AM, please contact night-coverage [email or web address] 09/08/2017, 1:29 PM

## 2017-09-09 ENCOUNTER — Ambulatory Visit: Payer: Medicare Other | Admitting: Internal Medicine

## 2017-09-09 DIAGNOSIS — I480 Paroxysmal atrial fibrillation: Secondary | ICD-10-CM

## 2017-09-09 DIAGNOSIS — K7290 Hepatic failure, unspecified without coma: Secondary | ICD-10-CM

## 2017-09-09 DIAGNOSIS — K7469 Other cirrhosis of liver: Secondary | ICD-10-CM

## 2017-09-09 DIAGNOSIS — Z950 Presence of cardiac pacemaker: Secondary | ICD-10-CM

## 2017-09-09 DIAGNOSIS — I25118 Atherosclerotic heart disease of native coronary artery with other forms of angina pectoris: Secondary | ICD-10-CM

## 2017-09-09 DIAGNOSIS — D696 Thrombocytopenia, unspecified: Secondary | ICD-10-CM

## 2017-09-09 DIAGNOSIS — I5032 Chronic diastolic (congestive) heart failure: Secondary | ICD-10-CM

## 2017-09-09 LAB — CBC WITH DIFFERENTIAL/PLATELET
Basophils Absolute: 0 10*3/uL (ref 0.0–0.1)
Basophils Relative: 1 %
Eosinophils Absolute: 0.2 10*3/uL (ref 0.0–0.7)
Eosinophils Relative: 4 %
HEMATOCRIT: 42.4 % (ref 39.0–52.0)
Hemoglobin: 14.7 g/dL (ref 13.0–17.0)
Lymphocytes Relative: 42 %
Lymphs Abs: 1.7 10*3/uL (ref 0.7–4.0)
MCH: 33.6 pg (ref 26.0–34.0)
MCHC: 34.7 g/dL (ref 30.0–36.0)
MCV: 96.8 fL (ref 78.0–100.0)
MONO ABS: 0.6 10*3/uL (ref 0.1–1.0)
MONOS PCT: 15 %
NEUTROS ABS: 1.6 10*3/uL — AB (ref 1.7–7.7)
Neutrophils Relative %: 38 %
Platelets: 51 10*3/uL — ABNORMAL LOW (ref 150–400)
RBC: 4.38 MIL/uL (ref 4.22–5.81)
RDW: 14.5 % (ref 11.5–15.5)
WBC: 4.1 10*3/uL (ref 4.0–10.5)

## 2017-09-09 LAB — COMPREHENSIVE METABOLIC PANEL
ALBUMIN: 2 g/dL — AB (ref 3.5–5.0)
ALK PHOS: 111 U/L (ref 38–126)
ALT: 24 U/L (ref 17–63)
ANION GAP: 6 (ref 5–15)
AST: 39 U/L (ref 15–41)
BILIRUBIN TOTAL: 1.5 mg/dL — AB (ref 0.3–1.2)
BUN: 6 mg/dL (ref 6–20)
CALCIUM: 7.9 mg/dL — AB (ref 8.9–10.3)
CO2: 20 mmol/L — ABNORMAL LOW (ref 22–32)
Chloride: 112 mmol/L — ABNORMAL HIGH (ref 101–111)
Creatinine, Ser: 0.78 mg/dL (ref 0.61–1.24)
GFR calc Af Amer: >60 mL/min (ref >60)
Glucose, Bld: 91 mg/dL (ref 65–99)
Potassium: 3.6 mmol/L (ref 3.5–5.1)
Sodium: 138 mmol/L (ref 135–145)
TOTAL PROTEIN: 6.2 g/dL — AB (ref 6.5–8.1)

## 2017-09-09 LAB — AMMONIA: Ammonia: 93 umol/L — ABNORMAL HIGH (ref 9–35)

## 2017-09-09 MED ORDER — LACTULOSE 10 GM/15ML PO SOLN: 30.0000 g | Freq: Three times a day (TID) | ORAL | 5 refills | Status: DC

## 2017-09-09 NOTE — Progress Notes (Signed)
Physical Therapy Treatment Patient Details Name: Hunter Carter MRN: [id number] DOB: [birth year] Today's Date: 09/09/2017    History of Present Illness Pt is a 66 y.o. male with history of CAD status post CABG, pacemaker placement, hepatitis C, cirrhosis of liver and chronic thrombocytopenia. He presented to the ED with dizziness and AMS. Labs revealed increased ammonia level.  Pt admitted with dx of hepatic encephalopathy.     PT Comments    Pt with marked improvement in cognitive status and mobility. Supervision required for transfers and ambulation 350 feet without AD. Recommending SNF at d/c. However, if family able to provide intermittent assist/supervison at home, primarily with transportation as pt is not safe to drive, pt can d/c home with HHPT.   Follow Up Recommendations  SNF;Supervision - Intermittent     Equipment Recommendations  None recommended by PT    Recommendations for Other Services       Precautions / Restrictions Precautions Precautions: Fall Restrictions Weight Bearing Restrictions: No    Mobility  Bed Mobility Overal bed mobility: Modified Independent             General bed mobility comments: +rail  Transfers   Equipment used: None   Sit to Stand: Supervision Stand pivot transfers: Supervision       General transfer comment: supervision for safety  Ambulation/Gait Ambulation/Gait assistance: Supervision Ambulation Distance (Feet): 350 Feet Assistive device: None Gait Pattern/deviations: Step-through pattern;Decreased stride length Gait velocity: decreased   General Gait Details: improved gait pattern. No shuffling noted. Mild SOB noted after ambulation.   Stairs            Wheelchair Mobility    Modified Rankin (Stroke Patients Only)       Balance   Sitting-balance support: No upper extremity supported;Feet supported Sitting balance-Leahy Scale: Normal     Standing balance support: No upper extremity  supported;During functional activity Standing balance-Leahy Scale: Good                   Standardized Balance Assessment Standardized Balance Assessment : Dynamic Gait Index   Dynamic Gait Index Level Surface: Normal Change in Gait Speed: Normal Gait with Horizontal Head Turns: Normal Gait with Vertical Head Turns: Normal Gait and Pivot Turn: Normal Step Over Obstacle: Mild Impairment Step Around Obstacles: Normal Steps: Mild Impairment Total Score: 22      Cognition Arousal/Alertness: Awake/alert Behavior During Therapy: WFL for tasks assessed/performed Overall Cognitive Status: Impaired/Different from baseline                     Current Attention Level: Selective Memory: Decreased short-term memory Following Commands: Follows one step commands consistently;Follows multi-step commands inconsistently;Follows multi-step commands with increased time Safety/Judgement: Decreased awareness of safety   Problem Solving: Slow processing General Comments: Pt more alert with decreased confusion today.       Exercises      General Comments        Pertinent Vitals/Pain Pain Assessment: No/denies pain    Home Living                      Prior Function            PT Goals (current goals can now be found in the care plan section) Acute Rehab PT Goals Patient Stated Goal: home PT Goal Formulation: With patient Time For Goal Achievement: 09/22/17 Potential to Achieve Goals: Good Progress towards PT goals: Progressing toward goals    Frequency  Min 3X/week      PT Plan Discharge plan needs to be updated    Co-evaluation              AM-PAC PT "6 Clicks" Daily Activity  Outcome Measure  Difficulty turning over in bed (including adjusting bedclothes, sheets and blankets)?: None Difficulty moving from lying on back to sitting on the side of the bed? : None Difficulty sitting down on and standing up from a chair with arms (e.g.,  wheelchair, bedside commode, etc,.)?: None Help needed moving to and from a bed to chair (including a wheelchair)?: None Help needed walking in hospital room?: None Help needed climbing 3-5 steps with a railing? : A Little 6 Click Score: 23    End of Session Equipment Utilized During Treatment: Gait belt Activity Tolerance: Patient tolerated treatment well Patient left: in bed;with call bell/phone within reach;with bed alarm set Nurse Communication: Mobility status PT Visit Diagnosis: Other abnormalities of gait and mobility (R26.89)     Time: 1410-3013 PT Time Calculation (min) (ACUTE ONLY): 26 min  Charges:  $Gait Training: 23-37 mins                    G Codes:       Lorrin Goodell, PT  Office # [phone] Pager [phone]    Lorriane Shire 09/09/2017, 11:10 AM

## 2017-09-09 NOTE — Discharge Summary (Signed)
Physician Discharge Summary   Patient ID: Hunter Carter MRN: [id number] DOB/AGE: [birth year] 66 y.o.  Admit date: 09/07/2017 Discharge date: 09/09/2017  Primary Care Physician:  Velna Hatchet, MD  Discharge Diagnoses:    . acute Hepatic encephalopathy ([place]) . Paroxysmal atrial fibrillation (HCC) . Pacemaker - dual chamber Medtronic 2011 . Cirrhosis ([place]) . Chronic diastolic congestive heart failure ([place]) . CAD s/p CABG 2004   Consults:  None   Recommendations for Outpatient Follow-up:  1. Home health PT, OT, RN,  home health aid, RN, SW, DME equipment to be arranged by the case manager 2. Please repeat CBC/BMET at next visit 3.  Patient was recommended to continue lactulose titrate to have 3-4 bowel movements in a day  DIET: Heart healthy diet    Allergies:  No Known Allergies   DISCHARGE MEDICATIONS: Current Discharge Medication List    START taking these medications   Details  lactulose (CHRONULAC) 10 GM/15ML solution Take 45 mLs (30 g total) by mouth 3 (three) times daily. Titrate for 3-4 BM's/ day Qty: 240 mL, Refills: 5      CONTINUE these medications which have NOT CHANGED   Details  furosemide (LASIX) 40 MG tablet Take 1 tablet (40 mg total) by mouth as needed. Please make appointment. Qty: 90 tablet, Refills: 0    losartan (COZAAR) 25 MG tablet Take 1 tablet (25 mg total) by mouth daily. Qty: 90 tablet, Refills: 1    metoprolol (LOPRESSOR) 50 MG tablet Take 1 tablet (50 mg total) by mouth 2 (two) times daily. Qty: 60 tablet, Refills: 2    simvastatin (ZOCOR) 20 MG tablet Take 1 tablet (20 mg total) by mouth daily. Please make appointment. Qty: 90 tablet, Refills: 0    albuterol (PROVENTIL HFA;VENTOLIN HFA) 108 (90 Base) MCG/ACT inhaler Inhale 2 puffs into the lungs every 4 (four) hours as needed for wheezing or shortness of breath (or coughing). Qty: 1 Inhaler, Refills: 0    ondansetron (ZOFRAN ODT) 4 MG disintegrating tablet Take 1 tablet (4  mg total) by mouth every 8 (eight) hours as needed for nausea or vomiting. Qty: 10 tablet, Refills: 0      STOP taking these medications     valsartan (DIOVAN) 320 MG tablet          Brief H and P: For complete details please refer to admission H and P, but in brief Hunter Carter is a 66 year old male with a medical history of CAD status post CABG, hepatitis C and liver cirrhosis, chronic thrombocytopenia who came to the ED after family find the patient  acting funny and confused.  In the ED patient was found to be confused but nonfocal CT of the head was unremarkable and lab workup which showed ammonia of 156.  Patient was given lactulose p.o. and admitted for further management.  Patient with a working diagnosis of hepatic encephalopathy.   Hospital Course:     Hepatic encephalopathy ([place]) -Known history of cirrhosis, hepatitis C -Patient had not been taking lactulose, had ran out of the prescription and needed refills -Ammonia level was 156 at the time of admission-Patient was placed on lactulose, titrate to 3-4 bowel movements in a day -Ammonia level 93 at the time of discharge, alert and oriented, ambulating, back to baseline per family at the bedside -Patient was given the prescription for lactulose and recommended to follow-up with his GI. -Patient was evaluated by physical therapy multiple times during the hospitalization and has marked improvement at the time of discharge. -  Patient was recommended skilled nursing facility if he is not able to arrange supervision at home, recommended not to drive.  Discussed in detail with the patient's family at the bedside regarding PT evaluation. patient and family requested home health PT, OT, home health aide, RN and DME equipment. -Case management was consulted   Active Problems:   CAD s/p CABG 2004, Pacemaker - dual chamber Medtronic 2011   Chronic diastolic congestive heart failure ([place]) -Currently stable, no acute  issues  Hypertension -Continue losartan, metoprolol, stable    Paroxysmal atrial fibrillation (HCC) -Continue metoprolol, not on anticoagulation secondary to thrombocytopenia     Day of Discharge BP (!) 142/92 (BP Location: Right Arm)   Pulse 75   Temp 97.7 F (36.5 C) (Oral)   Resp 18   Ht 5\' 6"  (1.676 m)   Wt 116 kg (255 lb 11.7 oz)   SpO2 98%   BMI 41.28 kg/m   Physical Exam: General: Alert and awake oriented x3 not in any acute distress. HEENT: anicteric sclera, pupils reactive to light and accommodation CVS: S1-S2 clear no murmur rubs or gallops Chest: clear to auscultation bilaterally, no wheezing rales or rhonchi Abdomen: soft nontender, nondistended, normal bowel sounds Extremities: no cyanosis, clubbing or edema noted bilaterally Neuro: Cranial nerves II-XII intact, no focal neurological deficits   The results of significant diagnostics from this hospitalization (including imaging, microbiology, ancillary and laboratory) are listed below for reference.    LAB RESULTS: Basic Metabolic Panel: Recent Labs  Lab 09/08/17 0500 09/09/17 0246  NA 139 138  K 3.5 3.6  CL 110 112*  CO2 23 20*  GLUCOSE 81 91  BUN 5* 6  CREATININE 0.73 0.78  CALCIUM 8.2* 7.9*   Liver Function Tests: Recent Labs  Lab 09/08/17 0500 09/09/17 0246  AST 48* 39  ALT 29 24  ALKPHOS 123 111  BILITOT 1.9* 1.5*  PROT 7.2 6.2*  ALBUMIN 2.3* 2.0*   No results for input(s): LIPASE, AMYLASE in the last 168 hours. Recent Labs  Lab 09/08/17 0500 09/09/17 0246  AMMONIA 135* 93*   CBC: Recent Labs  Lab 09/08/17 0500 09/09/17 0246  WBC 3.7* 4.1  NEUTROABS  --  1.6*  HGB 15.7 14.7  HCT 44.6 42.4  MCV 93.5 96.8  PLT 46* 51*   Cardiac Enzymes: Recent Labs  Lab 09/07/17 1834  TROPONINI 0.05*   BNP: Invalid input(s): POCBNP CBG: No results for input(s): GLUCAP in the last 168 hours.  Significant Diagnostic Studies:  Dg Chest 2 View  Result Date: 09/07/2017 CLINICAL  DATA:  66 year old male with dizziness since last night. EXAM: CHEST  2 VIEW COMPARISON:  04/04/2017 FINDINGS: Stable postoperative changes with a left-sided pacer device in place. Cardiomediastinal silhouette is enlarged but stable. There are no focal parenchymal opacities, sizable pleural effusions or pneumothorax. No acute osseous abnormalities. IMPRESSION: Stable appearance of the chest with cardiomegaly and postoperative changes. No acute intrathoracic process. Electronically Signed   By: Kristopher Oppenheim M.D.   On: 09/07/2017 17:55   Ct Head Wo Contrast  Result Date: 09/07/2017 CLINICAL DATA:  Dizziness and unexplained altered level of consciousness beginning last night. EXAM: CT HEAD WITHOUT CONTRAST TECHNIQUE: Contiguous axial images were obtained from the base of the skull through the vertex without intravenous contrast. COMPARISON:  None. FINDINGS: Brain: No evidence of acute infarction, hemorrhage, hydrocephalus, extra-axial collection, or mass lesion/mass effect. Mild to moderate chronic small vessel disease. Vascular:  No hyperdense vessel or other acute findings. Skull: No evidence of  fracture or other significant bone abnormality. Sinuses/Orbits:  No acute findings. Other: None. IMPRESSION: No acute intracranial abnormality. Mild to moderate chronic small vessel disease. Electronically Signed   By: Earle Gell M.D.   On: 09/07/2017 19:15   Ct Abdomen Pelvis W Contrast  Result Date: 09/08/2017 CLINICAL DATA:  Patient with abdominal bloating and distention. EXAM: CT ABDOMEN AND PELVIS WITH CONTRAST TECHNIQUE: Multidetector CT imaging of the abdomen and pelvis was performed using the standard protocol following bolus administration of intravenous contrast. CONTRAST:  170mL ISOVUE-300 IOPAMIDOL (ISOVUE-300) INJECTION 61% COMPARISON:  CT abdomen pelvis 01/27/2015. FINDINGS: Lower chest: Normal heart size. Paraesophageal varices are demonstrated. Dependent atelectasis within the lower lobes  bilaterally. No pleural effusion. Hepatobiliary: Cirrhotic morphology to the liver. Multiple stones within the gallbladder lumen. Re- cannulization of the periumbilical vein. Pancreas: Unremarkable Spleen: Unremarkable Adrenals/Urinary Tract: The adrenal glands are normal. Kidneys enhance symmetrically with contrast. No hydronephrosis. Urinary bladder is mildly thick walled. Stomach/Bowel: Descending and sigmoid colonic diverticulosis. No CT evidence for acute diverticulitis. The appendix is normal. No evidence for small bowel obstruction. No free fluid or free intraperitoneal air. Normal morphology of the stomach. Vascular/Lymphatic: Normal caliber abdominal aorta. Peripheral calcified atherosclerotic plaque. No retroperitoneal lymphadenopathy. Reproductive: Calcifications within the prostate. Other: Fat containing left inguinal hernia. Musculoskeletal: Lumbar spine degenerative changes. No aggressive or acute appearing osseous lesions. IMPRESSION: 1. No acute process within the abdomen or pelvis. 2. Morphologic changes to the liver compatible with cirrhosis. Sequelae of portal venous hypertension including re- cannulization of the periumbilical vein. 3. Cholelithiasis. Electronically Signed   By: Lovey Newcomer M.D.   On: 09/08/2017 18:04    2D ECHO:   Disposition and Follow-up: Discharge Instructions    Diet - low sodium heart healthy   Complete by:  As directed    Discharge instructions   Complete by:  As directed    Please take lactulose 3 times a day, titrate dose for atleast 3-4 BM's a day  STOP valsartan   Increase activity slowly   Complete by:  As directed        DISPOSITION: *Home   DISCHARGE FOLLOW-UP Follow-up Information    Velna Hatchet, MD. Schedule an appointment as soon as possible for a visit in 1 week(s).   Specialty:  Internal Medicine Contact information: [address] [place] [id number] [phone]            Time spent on Discharge: 25  minutes  Signed:   Estill Cotta M.D. [facility] 09/09/2017, 12:01 PM Pager: [phone]

## 2017-09-09 NOTE — Progress Notes (Signed)
CSW consulted for SNF placement, but on consulting with RNCM and chart review, note plan is for patient to return home with services. RNCM following for discharge needs. CSW signed off.  Estanislado Emms, [place]

## 2017-09-09 NOTE — Care Management Note (Signed)
Case Management Note  Patient Details  Name: Elijha Dedman MRN: [id number] Date of Birth: 11/24/1950  Subjective/Objective:      CM following for progression and d/c planning.               Action/Plan: 09/09/2017 Met with pt and family members discussed pt access to medications, he has medication benefits and orders meds thru the mail , however pt was reminded that he will need his lactatose immediately. Pt has purchased this at [facility] previously for $3 copay. Family will assist pt with going to the pharmacy.  DME ordered, pt selected [facility] for [facility] and DME. [place] notified. Pt insurance will not cover OT at this time, will ask MD to cancel this order.   Expected Discharge Date:  09/09/17               Expected Discharge Plan:  [place]  In-House Referral:  NA  Discharge planning Services  CM Consult  Post Acute Care Choice:  Durable Medical Equipment, Home Health Choice offered to:  Patient  DME Arranged:  3-N-1, Walker rolling DME Agency:  [place]:  RN, PT, NA, Social Work [employer] Agency:  [place]  Status of Service:  Completed, signed off  If discussed at [employer], dates discussed:    Additional Comments:  Adron Bene, RN 09/09/2017, 12:09 PM

## 2017-09-09 NOTE — Progress Notes (Signed)
Hunter Carter to be D/C'd Home per MD order.  Discussed prescriptions and follow up appointments with the patient. Prescriptions given to patient, medication list explained in detail. Pt verbalized understanding.  Allergies as of 09/09/2017   No Known Allergies     Medication List    STOP taking these medications   valsartan 320 MG tablet Commonly known as:  DIOVAN     TAKE these medications   albuterol 108 (90 Base) MCG/ACT inhaler Commonly known as:  PROVENTIL HFA;VENTOLIN HFA Inhale 2 puffs into the lungs every 4 (four) hours as needed for wheezing or shortness of breath (or coughing).   furosemide 40 MG tablet Commonly known as:  LASIX Take 1 tablet (40 mg total) by mouth as needed. Please make appointment.   lactulose 10 GM/16ML solution Commonly known as:  CHRONULAC Take 45 mLs (30 g total) by mouth 3 (three) times daily. Titrate for 3-4 BM's/ day   losartan 25 MG tablet Commonly known as:  COZAAR Take 1 tablet (25 mg total) by mouth daily.   metoprolol tartrate 50 MG tablet Commonly known as:  LOPRESSOR Take 1 tablet (50 mg total) by mouth 2 (two) times daily.   ondansetron 4 MG disintegrating tablet Commonly known as:  ZOFRAN ODT Take 1 tablet (4 mg total) by mouth every 8 (eight) hours as needed for nausea or vomiting.   simvastatin 20 MG tablet Commonly known as:  ZOCOR Take 1 tablet (20 mg total) by mouth daily. Please make appointment.            Durable Medical Equipment  (From admission, onward)        Start     Ordered   09/09/17 1204  For home use only DME Walker rolling  Once    Question:  Patient needs a walker to treat with the following condition  Answer:  Weakness generalized   09/09/17 1203   09/09/17 1204  For home use only DME 3 n 1  Once     09/09/17 1203   09/09/17 1147  For home use only DME Bedside commode  Once    Question:  Patient needs a bedside commode to treat with the following condition  Answer:  Hepatic encephalopathy  ([place])   09/09/17 1146   09/09/17 1147  For home use only DME 3 n 1  Once     09/09/17 1146      Vitals:   09/09/17 0512 09/09/17 0842  BP: 123/85 (!) 142/92  Pulse: 75 75  Resp: 19 18  Temp: 98 F (36.7 C) 97.7 F (36.5 C)  SpO2: 97% 98%    Skin clean, dry and intact without evidence of skin break down, no evidence of skin tears noted. IV catheter discontinued intact. Site without signs and symptoms of complications. Dressing and pressure applied. Pt denies pain at this time. No complaints noted.  An After Visit Summary was printed and given to the patient. Patient escorted via [place], and D/C home via private auto.  Chapman Fitch BSN, RN [facility] 6M Phone [phone]

## 2017-09-10 LAB — HCV RNA QUANT
HCV QUANT LOG: 6.104 {Log_IU}/mL (ref >1.70)
HCV Quantitative: 1270000 IU/mL (ref >50)

## 2017-09-11 LAB — HEPATITIS C VRS RNA DETECT BY PCR-QUAL: Hepatitis C Vrs RNA by PCR-Qual: POSITIVE — AB

## 2017-09-17 ENCOUNTER — Emergency Department (HOSPITAL_COMMUNITY)
Admission: EM | Admit: 2017-09-17 | Discharge: 2017-09-17 | Disposition: A | Discharge status: Home / Self Care | Payer: Medicare Other | Attending: Emergency Medicine | Admitting: Emergency Medicine

## 2017-09-17 ENCOUNTER — Other Ambulatory Visit: Payer: Self-pay

## 2017-09-17 ENCOUNTER — Encounter (HOSPITAL_COMMUNITY): Payer: Self-pay

## 2017-09-17 DIAGNOSIS — I5032 Chronic diastolic (congestive) heart failure: Secondary | ICD-10-CM | POA: Diagnosis not present

## 2017-09-17 DIAGNOSIS — Z96652 Presence of left artificial knee joint: Secondary | ICD-10-CM | POA: Diagnosis not present

## 2017-09-17 DIAGNOSIS — Z79899 Other long term (current) drug therapy: Secondary | ICD-10-CM | POA: Insufficient documentation

## 2017-09-17 DIAGNOSIS — I110 Hypertensive heart disease with heart failure: Secondary | ICD-10-CM | POA: Insufficient documentation

## 2017-09-17 DIAGNOSIS — Z950 Presence of cardiac pacemaker: Secondary | ICD-10-CM | POA: Diagnosis not present

## 2017-09-17 DIAGNOSIS — R410 Disorientation, unspecified: Secondary | ICD-10-CM | POA: Diagnosis present

## 2017-09-17 DIAGNOSIS — Z951 Presence of aortocoronary bypass graft: Secondary | ICD-10-CM | POA: Diagnosis not present

## 2017-09-17 DIAGNOSIS — R7989 Other specified abnormal findings of blood chemistry: Secondary | ICD-10-CM | POA: Diagnosis not present

## 2017-09-17 DIAGNOSIS — I2510 Atherosclerotic heart disease of native coronary artery without angina pectoris: Secondary | ICD-10-CM | POA: Insufficient documentation

## 2017-09-17 DIAGNOSIS — F17210 Nicotine dependence, cigarettes, uncomplicated: Secondary | ICD-10-CM | POA: Diagnosis not present

## 2017-09-17 LAB — CBC
HCT: 47.9 % (ref 39.0–52.0)
Hemoglobin: 16.8 g/dL (ref 13.0–17.0)
MCH: 33.3 pg (ref 26.0–34.0)
MCHC: 35.1 g/dL (ref 30.0–36.0)
MCV: 94.9 fL (ref 78.0–100.0)
PLATELETS: 52 10*3/uL — AB (ref 150–400)
RBC: 5.05 MIL/uL (ref 4.22–5.81)
RDW: 14.2 % (ref 11.5–15.5)
WBC: 3.4 10*3/uL — ABNORMAL LOW (ref 4.0–10.5)

## 2017-09-17 LAB — COMPREHENSIVE METABOLIC PANEL
ALBUMIN: 2.6 g/dL — AB (ref 3.5–5.0)
ALK PHOS: 143 U/L — AB (ref 38–126)
ALT: 33 U/L (ref 17–63)
AST: 54 U/L — AB (ref 15–41)
Anion gap: 6 (ref 5–15)
BUN: 5 mg/dL — AB (ref 6–20)
CALCIUM: 8.8 mg/dL — AB (ref 8.9–10.3)
CHLORIDE: 108 mmol/L (ref 101–111)
CO2: 25 mmol/L (ref 22–32)
CREATININE: 0.88 mg/dL (ref 0.61–1.24)
GFR calc non Af Amer: >60 mL/min (ref >60)
GLUCOSE: 100 mg/dL — AB (ref 65–99)
Potassium: 4.4 mmol/L (ref 3.5–5.1)
SODIUM: 139 mmol/L (ref 135–145)
Total Bilirubin: 2.5 mg/dL — ABNORMAL HIGH (ref 0.3–1.2)
Total Protein: 8 g/dL (ref 6.5–8.1)

## 2017-09-17 LAB — AMMONIA: Ammonia: 119 umol/L — ABNORMAL HIGH (ref 9–35)

## 2017-09-17 LAB — LIPASE, BLOOD: Lipase: 21 U/L (ref 11–51)

## 2017-09-17 MED ORDER — LACTULOSE 10 GM/15ML PO SOLN
30.0000 g | Freq: Once | ORAL | Status: AC
  Administered 2017-09-17: 30 g via ORAL
  Filled 2017-09-17: qty 45

## 2017-09-17 NOTE — Discharge Instructions (Signed)
If you develop sleepiness, trouble waking up, or altered mental status, return to the ER immediately.  Otherwise take the lactulose as prescribed and name for 3-4 bowel movements per day.  It is very important to follow-up with a gastroenterologist for outpatient management.

## 2017-09-17 NOTE — ED Notes (Signed)
No answer x 4 in patient waiting area.

## 2017-09-17 NOTE — ED Provider Notes (Signed)
[facility] EMERGENCY DEPARTMENT Provider Note   CSN: [id number] Arrival date & time: 09/17/17  1240     History   Chief Complaint Chief Complaint  Patient presents with  . Altered/ammonia level off    HPI Hunter Carter is a 66 y.o. male.  HPI  66 year old male with a history of cirrhosis and hepatic encephalopathy presents with family with concern for repeat hepatic encephalopathy.  The patient was more confused and somnolent yesterday and seems to be a little bit better today.  He states he has been compliant with his lactulose except that today he did not take it this morning because he was dizzy.  He did not take it for his lunchtime dose either.  Otherwise he denies any headaches or vomiting.  He had some brief upper abdominal pain that resolved prior to me seeing him.  No chest pain.  The dizziness is not currently present.  Family states he looks a lot better currently.  He is supposed to have home health help but they have not been able to come by yet.  Past Medical History:  Diagnosis Date  . Acute hepatic encephalopathy 10/10/2015   [name] 10/10/2015  . Arthritis    "joints ache" (10/10/2015)  . Bleeding stomach ulcer    "recently" (10/10/2015)  . CAD (coronary artery disease)   . CHF (congestive heart failure) ([place])   . Cirrhosis ([place])    due to HCV/notes 10/10/2015  . ETOH abuse   . GERD (gastroesophageal reflux disease)   . Heart block AV second degree    permanent pacemaker 02/2004  . Hepatitis C   . Hyperlipidemia   . Inferior MI ([place]) 2005  . Presence of permanent cardiac pacemaker   . RBBB   . Systemic hypertension   . Tubulovillous adenoma of colon   . Ventricular tachycardia ([place])    nonsustained    Patient Active Problem List   Diagnosis Date Noted  . Cirrhosis ([place]) 08/08/2016  . Paroxysmal atrial fibrillation ([place]) 01/05/2016  . Tobacco abuse 01/05/2016  . Hepatic encephalopathy ([place]) 10/10/2015  . Pain, joint, ankle,  left 10/10/2015  . Thrombocytopathia ([place]) 10/10/2015  . HLD (hyperlipidemia) 10/10/2015  . Chronic diastolic congestive heart failure ([place]) 10/10/2015  . Altered mental status   . Increased ammonia level   . Leg swelling   . Encephalopathy acute 08/01/2015  . GI bleed 07/29/2015  . Acute GI bleeding 07/29/2015  . Acute respiratory failure ([place])   . Bleeding gastrointestinal   . Hemorrhagic shock ([place])   . CAD s/p CABG 2004 07/20/2013  . Pacemaker - dual chamber Medtronic 2011 07/20/2013  . Hepatitis C 07/20/2013  . Hyperlipidemia 07/20/2013  . PAT (paroxysmal atrial tachycardia) ([place]) 07/20/2013  . Obesity (BMI 30-39.9) 07/20/2013  . NSVT (nonsustained ventricular tachycardia) ([place]) 07/20/2013  . HTN (hypertension) 07/16/2013    Past Surgical History:  Procedure Laterality Date  . CARDIAC CATHETERIZATION  03/06/04   significant 3 vessel disease, totalled graft obtuse marginal  . CORONARY ARTERY BYPASS GRAFT  02/10/03   LIMA to LAD,SVG to first diagonal, SVT to OM1  . ESOPHAGOGASTRODUODENOSCOPY N/A 07/29/2015   Procedure: ESOPHAGOGASTRODUODENOSCOPY (EGD);  Surgeon: Clarene Essex, MD;  Location[facility] ENDOSCOPY;  Service: Endoscopy;  Laterality: N/A;  . INSERT / REPLACE / REMOVE PACEMAKER  03/2010   Medtronic  . KNEE ARTHROSCOPY Left [phone]  . MEDIASTINAL EXPLORATION  02/10/2003  . NM MYOCAR PERF WALL MOTION  04/05/09   low risk scan-abnormal-mod. perfusion defect basal inferoseptal,basal  inferior,mid inferoseptal,mid inferior and apical inferior regions  . TOTAL KNEE ARTHROPLASTY Left   . US ECHOCARDIOGRAPHY  01/20/07   mild MR,mild to mod. TR,trace AI,mild PI       Home Medications    Prior to Admission medications   Medication Sig Start Date End Date Taking? Authorizing Provider  furosemide (LASIX) 40 MG tablet Take 1 tablet (40 mg total) by mouth as needed. Please make appointment. 05/25/16  Yes Croitoru, Mihai, MD  lactulose (CHRONULAC) 10 GM/15ML solution Take 45 mLs  (30 g total) by mouth 3 (three) times daily. Titrate for 3-4 BM's/ day 09/09/17  Yes Rai, Ripudeep K, MD  losartan (COZAAR) 25 MG tablet Take 1 tablet (25 mg total) by mouth daily. 07/31/17 10/29/17 Yes Barrett, [name], PA-C  metoprolol (LOPRESSOR) 50 MG tablet Take 1 tablet (50 mg total) by mouth 2 (two) times daily. 12/01/15  Yes Croitoru, Mihai, MD  simvastatin (ZOCOR) 20 MG tablet Take 1 tablet (20 mg total) by mouth daily. Please make appointment. 05/25/16  Yes Croitoru, Mihai, MD  albuterol (PROVENTIL HFA;VENTOLIN HFA) 108 (90 Base) MCG/ACT inhaler Inhale 2 puffs into the lungs every 4 (four) hours as needed for wheezing or shortness of breath (or coughing). Patient not taking: Reported on 82/95/6213 05/20/56   Delora Fuel, MD  ondansetron (ZOFRAN ODT) 4 MG disintegrating tablet Take 1 tablet (4 mg total) by mouth every 8 (eight) hours as needed for nausea or vomiting. Patient not taking: Reported on 09/17/2017 02/15/17   Blanchie Dessert, MD    Family History Family History  Problem Relation Age of Onset  . Heart failure Mother   . Liver disease Father   . Heart failure Sister   . Hypertension Sister   . Hypertension Sister   . Colon cancer Neg Hx     Social History Social History   Tobacco Use  . Smoking status: Current Every Day Smoker    Packs/day: 0.25    Years: 41.00    Pack years: 10.25    Types: Cigarettes  . Smokeless tobacco: Never Used  Substance Use Topics  . Alcohol use: Yes    Comment: 10/10/2015 "last drink was 2-3 months ago; h/o abuse"  . Drug use: Yes    Types: Marijuana    Comment: last use 2015     Allergies   Patient has no known allergies.   Review of Systems Review of Systems  Constitutional: Positive for fatigue. Negative for fever.  Respiratory: Negative for shortness of breath.   Cardiovascular: Negative for chest pain.  Gastrointestinal: Negative for vomiting.  Neurological: Positive for dizziness. Negative for headaches.    Psychiatric/Behavioral: Positive for confusion.  All other systems reviewed and are negative.    Physical Exam Updated Vital Signs BP (!) 165/135   Pulse 63   Temp 98.1 F (36.7 C) (Oral)   Resp 18   SpO2 (!) 89%   Physical Exam  Constitutional: He is oriented to person, place, and time. He appears well-developed and well-nourished.  HENT:  Head: Normocephalic and atraumatic.  Right Ear: External ear normal.  Left Ear: External ear normal.  Nose: Nose normal.  Eyes: Right eye exhibits no discharge. Left eye exhibits no discharge.  Neck: Neck supple.  Cardiovascular: Normal rate, regular rhythm and normal heart sounds.  Pulmonary/Chest: Effort normal and breath sounds normal.  Abdominal: Soft. There is no tenderness.  Musculoskeletal: He exhibits no edema.  Neurological: He is alert and oriented to person, place, and time.  Skin: Skin  is warm and dry.  Nursing note and vitals reviewed.    ED Treatments / Results  Labs (all labs ordered are listed, but only abnormal results are displayed) Labs Reviewed  COMPREHENSIVE METABOLIC PANEL - Abnormal; Notable for the following components:      Result Value   Glucose, Bld 100 (*)    BUN 5 (*)    Calcium 8.8 (*)    Albumin 2.6 (*)    AST 54 (*)    Alkaline Phosphatase 143 (*)    Total Bilirubin 2.5 (*)    All other components within normal limits  CBC - Abnormal; Notable for the following components:   WBC 3.4 (*)    Platelets 52 (*)    All other components within normal limits  AMMONIA - Abnormal; Notable for the following components:   Ammonia 119 (*)    All other components within normal limits  LIPASE, BLOOD    EKG  EKG Interpretation None       Radiology No results found.  Procedures Procedures (including critical care time)  Medications Ordered in ED Medications  lactulose (CHRONULAC) 10 GM/15ML solution 30 g (30 g Oral Given 09/17/17 2132)     Initial Impression / Assessment and Plan / ED  Course  I have reviewed the triage vital signs and the nursing notes.  Pertinent labs & imaging results that were available during my care of the patient were reviewed by me and considered in my medical decision making (see chart for details).     Patient is awake, alert, and at his baseline according to family and patient.  While he does have an elevated ammonia, he does not symptomatically look like hepatic encephalopathy.  I discussed with GI, Dr. Paulita Fujita, who has noted the patient has not followed up with [facility] or [facility].  Stressed importance of need to follow-up with them as well as take his lactulose as instructed.  He was given lactulose here.  Instructed to titrate to 3-4 stools per day.  Otherwise, he does not appear to need admission and is stable for discharge home with outpatient follow-up.  Final Clinical Impressions(s) / ED Diagnoses   Final diagnoses:  Increased ammonia level    ED Discharge Orders    None       Sherwood Gambler, MD 09/17/17 2357

## 2017-09-17 NOTE — ED Triage Notes (Signed)
Patient just recently admitted to hospital for elevated ammonia level. Patient per family with recurrent confusion and states that he thinks his ammonia is high again. Reports taking lactulose as prescribed

## 2017-09-17 NOTE — ED Notes (Signed)
Patient given discharge instructions and verbalized understanding.  Patient stable to discharge at this time.  Patient is alert and oriented to baseline.  No distressed noted at this time.  All belongings taken with the patient at discharge.   

## 2017-12-18 DIAGNOSIS — K7469 Other cirrhosis of liver: Secondary | ICD-10-CM | POA: Diagnosis not present

## 2017-12-18 DIAGNOSIS — K7290 Hepatic failure, unspecified without coma: Secondary | ICD-10-CM | POA: Diagnosis not present

## 2018-01-10 ENCOUNTER — Other Ambulatory Visit: Payer: Self-pay | Admitting: Physician Assistant

## 2018-01-10 NOTE — Telephone Encounter (Signed)
REFILL 

## 2018-01-20 ENCOUNTER — Ambulatory Visit (INDEPENDENT_AMBULATORY_CARE_PROVIDER_SITE_OTHER): Payer: Medicare Other | Admitting: *Deleted

## 2018-01-20 DIAGNOSIS — I480 Paroxysmal atrial fibrillation: Secondary | ICD-10-CM | POA: Diagnosis not present

## 2018-01-21 NOTE — Progress Notes (Signed)
Remote pacemaker transmission.   

## 2018-01-23 ENCOUNTER — Encounter: Payer: Self-pay | Admitting: Cardiology

## 2018-01-23 ENCOUNTER — Telehealth: Payer: Self-pay | Admitting: Cardiovascular Disease

## 2018-01-23 NOTE — Telephone Encounter (Signed)
Return call to Pt. He reports SOB only with exertions, some wheezing, and mild lower extremity edema. Pt states he feels the pollen has triggered him to become SOB. Pt recommended to call PCP to refill his albuterol, take a dose of his PRN Lasix, and to call the office back if he becomes more SOB.

## 2018-01-23 NOTE — Telephone Encounter (Signed)
New Message   Pt c/o Shortness Of Breath: STAT if SOB developed within the last 24 hours or pt is noticeably SOB on the phone  1. Are you currently SOB (can you hear that pt is SOB on the phone)? Spoke with [employer] rep who stated that she does not hear the sob.  2. How long have you been experiencing SOB? Today with some wheezing   3. Are you SOB when sitting or when up moving around? A mixture of both   4. Are you currently experiencing any other symptoms? Slight swelling in his leg.   Weight went from 215 to 222 in 3 days (4/5 - 4/9) today the weight is 218

## 2018-01-30 LAB — CUP PACEART REMOTE DEVICE CHECK
Brady Statistic AS VS Percent: 40 %
Implantable Lead Implant Date: 20110614
Implantable Lead Location: 753859
Implantable Lead Location: 753860
Implantable Lead Model: 5076
Lead Channel Impedance Value: 590 Ohm
Lead Channel Pacing Threshold Pulse Width: 0.4 ms
Lead Channel Sensing Intrinsic Amplitude: 2.8 mV
Lead Channel Sensing Intrinsic Amplitude: 8 mV
Lead Channel Setting Pacing Amplitude: 1.75 V
Lead Channel Setting Pacing Pulse Width: 0.4 ms
MDC IDC LEAD IMPLANT DT: 20050527
MDC IDC MSMT BATTERY IMPEDANCE: 325 Ohm
MDC IDC MSMT BATTERY REMAINING LONGEVITY: 120 mo
MDC IDC MSMT BATTERY VOLTAGE: 2.78 V
MDC IDC MSMT LEADCHNL RA IMPEDANCE VALUE: 456 Ohm
MDC IDC MSMT LEADCHNL RA PACING THRESHOLD AMPLITUDE: 0.875 V
MDC IDC MSMT LEADCHNL RA PACING THRESHOLD PULSEWIDTH: 0.4 ms
MDC IDC MSMT LEADCHNL RV PACING THRESHOLD AMPLITUDE: 0.875 V
MDC IDC PG IMPLANT DT: 20110614
MDC IDC SESS DTM: 20190407031622
MDC IDC SET LEADCHNL RV PACING AMPLITUDE: 2 V
MDC IDC SET LEADCHNL RV SENSING SENSITIVITY: 2.8 mV
MDC IDC STAT BRADY AP VP PERCENT: 1 %
MDC IDC STAT BRADY AP VS PERCENT: 59 %
MDC IDC STAT BRADY AS VP PERCENT: 0 %

## 2018-02-04 ENCOUNTER — Ambulatory Visit (INDEPENDENT_AMBULATORY_CARE_PROVIDER_SITE_OTHER): Payer: Medicare Other | Admitting: Family Medicine

## 2018-02-04 ENCOUNTER — Other Ambulatory Visit: Payer: Self-pay

## 2018-02-04 ENCOUNTER — Encounter: Payer: Self-pay | Admitting: Family Medicine

## 2018-02-04 VITALS — BP 128/80 | HR 68 | Temp 98.0°F | Wt 218.6 lb

## 2018-02-04 DIAGNOSIS — L989 Disorder of the skin and subcutaneous tissue, unspecified: Secondary | ICD-10-CM

## 2018-02-04 DIAGNOSIS — Z0000 Encounter for general adult medical examination without abnormal findings: Secondary | ICD-10-CM | POA: Diagnosis not present

## 2018-02-04 DIAGNOSIS — B182 Chronic viral hepatitis C: Secondary | ICD-10-CM

## 2018-02-04 NOTE — Progress Notes (Signed)
Subjective: Chief Complaint  Patient presents with  . New Patient (Initial Visit)     HPI: Hunter Carter is a 67 y.o. presenting to clinic today to discuss the following:  1 Establish Care with a primary care physician  Patient presents today and states he was dismissed from his previous primary care practice due to missing appointments. He says they say they called him but he was unaware of their calls and attempts to reach them. He received a letter in the mail and then was referred here.  Hunter Carter has a very complicated past medical history that include cirrohsis and hepatic encephalopathy, CAD s/pCABG in 2004, CHF with dual chamber pacemaker, HTN, HLD, A Fib, Hx of GI bleeds, and Hepatitis C. He currently is seeing an infectious disease specialist for treatment of his Hepatitis C.   He brought his medications today and states he is currently taking them as prescribed and as directed. I have confirmed what he is taking is correct on our medication list.  Today he has no specific complaints. He is trying to exercise by walking everyday and he is attempting water aerobics. He states his last hospitalization was due to a "black out spell" that led to him falling and hitting his head which he now has a scar.  He denies fever, chills, weight loss, confusion, syncope, chest pain, SOB, orthopnea, difficulty breathing, abdominal pain, nausea, vomiting, diarrhea, or constipation. He denies feeling depressed, guilty, or hopeless.  He does endorse having some chronic issues with sleep that he thinks is due to losing to previous wives due to medical conditions. He also states he has recently lost his sex drive. I will discuss these specific topics at our next appointment as today was more about getting to know him and his health history.   Health Maintenance: TDap     ROS noted in HPI.   Past Medical, Surgical, Social, and Family History Reviewed & Updated per EMR.   Pertinent  Historical Findings include:   Social History   Tobacco Use  Smoking Status Current Every Day Smoker  . Packs/day: 0.25  . Years: 41.00  . Pack years: 10.25  . Types: Cigarettes  Smokeless Tobacco Never Used    Objective: BP 128/80 (BP Location: Left Arm)   Pulse 68   Temp 98 F (36.7 C) (Oral)   Wt 218 lb 9.6 oz (99.2 kg)   SpO2 98%   BMI 35.28 kg/m  Vitals and nursing notes reviewed  Physical Exam  Constitutional: He is oriented to person, place, and time. He appears well-developed and well-nourished. No distress.  HENT:  Head: Normocephalic and atraumatic.  Midline scar at the occipital region at the base of his skull, appears to be well healing. No discharge or active bleeding  Eyes: Pupils are equal, round, and reactive to light. EOM are normal. Scleral icterus is present.  Neck: Normal range of motion. Neck supple. No JVD present. No thyromegaly present.  Cardiovascular: Normal rate, regular rhythm, normal heart sounds and intact distal pulses.  No murmur heard. Pulmonary/Chest: Effort normal and breath sounds normal. No respiratory distress. He has no wheezes. He has no rales.  Abdominal: Soft. Bowel sounds are normal. There is no tenderness. There is no guarding. A hernia is present.  hepatomegaly  Musculoskeletal: Normal range of motion. He exhibits edema.  +2 pitting edema  Neurological: He is alert and oriented to person, place, and time.  Skin: Skin is warm and dry. Capillary refill takes less  than 2 seconds. No erythema.  Psychiatric: He has a normal mood and affect. His behavior is normal.       No results found for this or any previous visit (from the past 72 hour(s)).  Assessment/Plan:  Hepatitis C Cont current care under infectious disease.  Appreciate their support in caring for this patient and managing his Hep C disease  Skin lesion Skin lesion differential includes actinic keratosis vs cutaneous SCC. Please see picture in note. Referral to  dermatology. Keratinic horn shape is concerning for cutaneous SCC.  Scar on the back of his head appears to be an older well-healing scar. No exudate, drainage, or erythema makes it unlikely to be infected.  Cont his OTC cream. Discussed with patient a keloid could develop.  Routine general medical examination at a health care facility At next appointment will order CBC and consider getting lipid panel as he has not had one in 2 years.     PATIENT EDUCATION PROVIDED: See AVS    Diagnosis and plan along with any newly prescribed medication(s) were discussed in detail with this patient today. The patient verbalized understanding and agreed with the plan. Patient advised if symptoms worsen return to clinic or ER.   Health Maintainance:   Orders Placed This Encounter  Procedures  . Ambulatory referral to Dermatology    Referral Priority:   Routine    Referral Type:   Consultation    Referral Reason:   Specialty Services Required    Requested Specialty:   Dermatology    Number of Visits Requested:   1    No orders of the defined types were placed in this encounter.    Harolyn Rutherford, DO 02/04/2018, 1:49 PM PGY-1, [place]

## 2018-02-04 NOTE — Patient Instructions (Addendum)
It was great to meet you today! Thank you for letting me participate in your care!  Today, we discussed your current health problems. Please continue to follow along with your specialists for management of your cirrhosis and hepatitis C. Please also continue to see your Cardiologist for management of your pacemaker, CHF, and CAD.  We also reviewed your current medications and I made no changes today.  Please return in one month and we will obtain labs and discuss your current health issues.   Be well, Harolyn Rutherford, DO PGY-1, [facility]

## 2018-02-12 ENCOUNTER — Telehealth: Payer: Self-pay

## 2018-02-12 ENCOUNTER — Other Ambulatory Visit: Payer: Self-pay | Admitting: Family Medicine

## 2018-02-12 MED ORDER — LACTULOSE 10 GM/15ML PO SOLN: 30.0000 g | Freq: Two times a day (BID) | ORAL | 5 refills | Status: DC | PRN

## 2018-02-12 NOTE — Progress Notes (Signed)
Got message from a home health nurse that he is only taking lactulose BID not TID. Updated his meds in medications list.

## 2018-02-12 NOTE — Telephone Encounter (Signed)
Altha Harm, RN called to let MD know patient told her yesterday he felt "woosy" after activity but has since felt fine. Also wanted to let MD know patient is taking lactulose only twice a day instead of 3 times. Did not leave her call back information Wallace Cullens, RN

## 2018-02-13 DIAGNOSIS — L989 Disorder of the skin and subcutaneous tissue, unspecified: Secondary | ICD-10-CM | POA: Insufficient documentation

## 2018-02-13 DIAGNOSIS — Z0000 Encounter for general adult medical examination without abnormal findings: Secondary | ICD-10-CM | POA: Insufficient documentation

## 2018-02-13 NOTE — Assessment & Plan Note (Signed)
Cont current care under infectious disease.  Appreciate their support in caring for this patient and managing his Hep C disease

## 2018-02-13 NOTE — Assessment & Plan Note (Addendum)
Skin lesion differential includes actinic keratosis vs cutaneous SCC. Please see picture in note. Referral to dermatology. Keratinic horn shape is concerning for cutaneous SCC.  Scar on the back of his head appears to be an older well-healing scar. No exudate, drainage, or erythema makes it unlikely to be infected.  Cont his OTC cream. Discussed with patient a keloid could develop.

## 2018-02-13 NOTE — Assessment & Plan Note (Signed)
At next appointment will order CBC and consider getting lipid panel as he has not had one in 2 years.

## 2018-02-18 ENCOUNTER — Telehealth: Payer: Self-pay

## 2018-02-18 NOTE — Telephone Encounter (Signed)
Christine- RN with [facility] calling regarding patient. He is in the heart health program- RN wanted to inform MD that patient has had a 5.2lb weight gain over the last 2 days, pt reports had some swelling but is "getting better." RN wants MD to be aware- left patients call back [phone]. Chrisitne's call back [phone] x [phone] Spoke with Dr. Garlan Fillers who recommends patient take an extra lasix pill for the next 3 days. Attempted to call patient to discuss, no answer- left voicemail to return call.  Wallace Cullens, RN

## 2018-02-21 DIAGNOSIS — L821 Other seborrheic keratosis: Secondary | ICD-10-CM | POA: Diagnosis not present

## 2018-02-21 DIAGNOSIS — D485 Neoplasm of uncertain behavior of skin: Secondary | ICD-10-CM | POA: Diagnosis not present

## 2018-02-21 DIAGNOSIS — L918 Other hypertrophic disorders of the skin: Secondary | ICD-10-CM | POA: Diagnosis not present

## 2018-02-21 DIAGNOSIS — L299 Pruritus, unspecified: Secondary | ICD-10-CM | POA: Diagnosis not present

## 2018-02-24 ENCOUNTER — Telehealth: Payer: Self-pay | Admitting: Cardiovascular Disease

## 2018-02-24 NOTE — Telephone Encounter (Signed)
OK, will address at office visit

## 2018-02-24 NOTE — Telephone Encounter (Signed)
Returned the call to the patient per the home nurse request. The patient stated that he does have "off and on" lower extremity edema. He takes furosemide 40 mg on a daily basis. Today his weight was 220. He stated that he runs from 213-220 pounds.   He has some wheezing on occasion but he is able to do water aerobics on a regular basis. He stated that as long as he elevates his legs at night that "he does feels okay." He denied any further assistance but will call back if anything further is needed.  He has a follow up with Dr. Sallyanne Kuster on 5/17.

## 2018-02-24 NOTE — Telephone Encounter (Signed)
New Message:    She wants you to call pt,he is maintaining a lot of fluid. She said she wanted you to talk to the pt about what is going on with him.Marland Kitchen

## 2018-02-26 ENCOUNTER — Encounter: Payer: Self-pay | Admitting: Cardiovascular Disease

## 2018-02-28 ENCOUNTER — Encounter: Payer: Self-pay | Admitting: Cardiovascular Disease

## 2018-02-28 ENCOUNTER — Ambulatory Visit: Payer: Medicare Other | Admitting: Cardiovascular Disease

## 2018-02-28 VITALS — BP 106/78 | HR 87 | Ht 66.0 in | Wt 222.0 lb

## 2018-02-28 DIAGNOSIS — I25810 Atherosclerosis of coronary artery bypass graft(s) without angina pectoris: Secondary | ICD-10-CM

## 2018-02-28 DIAGNOSIS — I5032 Chronic diastolic (congestive) heart failure: Secondary | ICD-10-CM

## 2018-02-28 DIAGNOSIS — I480 Paroxysmal atrial fibrillation: Secondary | ICD-10-CM

## 2018-02-28 DIAGNOSIS — I10 Essential (primary) hypertension: Secondary | ICD-10-CM

## 2018-02-28 DIAGNOSIS — J449 Chronic obstructive pulmonary disease, unspecified: Secondary | ICD-10-CM | POA: Diagnosis not present

## 2018-02-28 DIAGNOSIS — K7460 Unspecified cirrhosis of liver: Secondary | ICD-10-CM

## 2018-02-28 DIAGNOSIS — Z950 Presence of cardiac pacemaker: Secondary | ICD-10-CM | POA: Diagnosis not present

## 2018-02-28 DIAGNOSIS — E785 Hyperlipidemia, unspecified: Secondary | ICD-10-CM

## 2018-02-28 MED ORDER — ALBUTEROL SULFATE HFA 108 (90 BASE) MCG/ACT IN AERS: 2.0000 | INHALATION_SPRAY | RESPIRATORY_TRACT | 0 refills | Status: DC | PRN

## 2018-02-28 NOTE — Patient Instructions (Signed)
Medication Instructions: Dr Sallyanne Kuster recommends that you continue on your current medications as directed. Please refer to the Current Medication list given to you today.  FUTURE REFILLS OF ALBUTEROL MUST COME FROM YOUR PRIMARY CARE PROVIDER.  Labwork: Your physician recommends that you return for lab work at your convenience - FASTING.  Testing/Procedures: NONE ORDERED  Follow-up: Dr Sallyanne Kuster recommends that you schedule a follow-up appointment in 12 months. You will receive a reminder letter in the mail two months in advance. If you don't receive a letter, please call our office to schedule the follow-up appointment.  If you need a refill on your cardiac medications before your next appointment, please call your pharmacy.

## 2018-02-28 NOTE — Progress Notes (Signed)
. Patient ID: Hunter Carter, male   DOB: [birth year], 67 y.o.   MRN: [id number]    Cardiology Office Note    Date:  03/01/2018   ID:  Hunter Carter, DOB 1950/12/06, MRN [id number]  PCP:  Nuala Alpha, DO  Cardiologist:   Hunter Klein, MD   Chief Complaint  Patient presents with  . Follow-up  . Headache  . Chest Pain  . Shortness of Breath  . Edema    Ankles.    History of Present Illness:  Hunter Carter is a 67 y.o. male returning in f/u for CAD s/p CABG, paroxysmal atrial fibrillation, second degree AV block, pacemaker check.  Comorbidities include chronic liver disease versus previous hepatic encephalopathy, coagulopathy, thrombocytopenia.  Overall Hunter Carter is doing quite well.  He is physically active and goes to water aerobics as well as exercising by walking.  He denies problems with exertional dyspnea and has not had orthopnea or PND.  He has intermittent problems with ankle swelling, but it usually resolves after overnight supine position.  As before, he typically will take his furosemide 6 out of 7 days each week.  If he skips more than that at that time, the ankle swelling worsens but he does not develop shortness of breath.  He has not had any angina pectoris denies palpitations dizziness or intermittent claudication.  He is taking lactulose for encephalopathy and has had a little bit of difficulty titrating the dose.  He occasionally has problems with excessive daytime sleepiness and reversal of the normal daily-night cycle.  At other times he has excessive diarrhea.  He has been completely abstinent from alcohol and cigarettes.  He has not had any acute focal neurological events to suggest stroke or TIA and has not had any overt bleeding problems.  Pacemaker interrogation in April 2009 shows normal device function. Presenting rhythm was atrial paced ventricular sensed. He has atrial pacing 60% of the time and only has ventricular pacing 1% of the time.  Current generator longevity is estimated at 10 years and his 2011 Medtronic Adapta device is functioning normally. Lead parameters are good.  As before he has extremely rare episodes of atrial fibrillation (mode switch 0.4%), but the episodes have been as long as 18 hours in duration, with good rate control.  He is status post bypass surgery (LIMA to LAD, SVG to diagonal and jump graft from the SVG to the diagonal to anobtuse marginal). In May 2005 he presented with acute myocardial infarction and was found to have total occlusion of the vein graft to the obtuse marginal branch, treated medically. His last functional assessment was a nuclear stress test in 2010 that showed a moderate scar in the distribution of the right coronary artery without ischemia and with normal left ventricular systolic function. He received his pacemaker in 2005 for second degree AV block, had a generator change out in 2011 (Medtronic Adapta dual-chamber device).  He has a history of hepatitis C and has cirrhosis despite virologic cure with evidence of parenchymal insufficiency, coagulopathy, thrombocytopenia and previous hospitalization for GI bleeding and hepatic encephalopathy. Most recent bleeding event secondary to gastric ulcer was November 2016 and led to the need for blood transfusions.  Sees Hunter Locks, NP at [facility].  Past Medical History:  Diagnosis Date  . Acute hepatic encephalopathy 10/10/2015   Hunter Carter 10/10/2015  . Arthritis    "joints ache" (10/10/2015)  . Bleeding stomach ulcer    "recently" (10/10/2015)  . CAD (coronary artery disease)   .  CHF (congestive heart failure) ([place])   . Cirrhosis ([place])    due to HCV/notes 10/10/2015  . ETOH abuse   . GERD (gastroesophageal reflux disease)   . Heart block AV second degree    permanent pacemaker 02/2004  . Hepatitis C   . Hyperlipidemia   . Inferior MI ([place]) 2005  . Presence of permanent cardiac pacemaker   . RBBB   . Systemic  hypertension   . Tubulovillous adenoma of colon   . Ventricular tachycardia (HCC)    nonsustained    Past Surgical History:  Procedure Laterality Date  . CARDIAC CATHETERIZATION  03/06/04   significant 3 vessel disease, totalled graft obtuse marginal  . CORONARY ARTERY BYPASS GRAFT  02/10/03   LIMA to LAD,SVG to first diagonal, SVT to OM1  . ESOPHAGOGASTRODUODENOSCOPY N/A 07/29/2015   Procedure: ESOPHAGOGASTRODUODENOSCOPY (EGD);  Surgeon: Hunter Essex, MD;  Location: [facility] ENDOSCOPY;  Service: Endoscopy;  Laterality: N/A;  . INSERT / REPLACE / REMOVE PACEMAKER  03/2010   Medtronic  . KNEE ARTHROSCOPY Left [phone]  . MEDIASTINAL EXPLORATION  02/10/2003  . NM MYOCAR PERF WALL MOTION  04/05/09   low risk scan-abnormal-mod. perfusion defect basal inferoseptal,basal inferior,mid inferoseptal,mid inferior and apical inferior regions  . TOTAL KNEE ARTHROPLASTY Left   . US ECHOCARDIOGRAPHY  01/20/07   mild MR,mild to mod. TR,trace AI,mild PI    Outpatient Medications Prior to Visit  Medication Sig Dispense Refill  . furosemide (LASIX) 40 MG tablet Take 1 tablet (40 mg total) by mouth as needed. Please make appointment. 90 tablet 0  . lactulose (CHRONULAC) 10 GM/15ML solution Take 45 mLs (30 g total) by mouth 2 (two) times daily as needed for mild constipation. Titrate for 3-4 BM's/ day 240 mL 5  . losartan (COZAAR) 25 MG tablet Take 1 tablet (25 mg total) by mouth daily. KEEP OV. 90 tablet 0  . metoprolol (LOPRESSOR) 50 MG tablet Take 1 tablet (50 mg total) by mouth 2 (two) times daily. 60 tablet 2  . ondansetron (ZOFRAN ODT) 4 MG disintegrating tablet Take 1 tablet (4 mg total) by mouth every 8 (eight) hours as needed for nausea or vomiting. 10 tablet 0  . simvastatin (ZOCOR) 20 MG tablet Take 1 tablet (20 mg total) by mouth daily. Please make appointment. 90 tablet 0  . albuterol (PROVENTIL HFA;VENTOLIN HFA) 108 (90 Base) MCG/ACT inhaler Inhale 2 puffs into the lungs every 4 (four) hours as  needed for wheezing or shortness of breath (or coughing). 1 Inhaler 0   No facility-administered medications prior to visit.      Allergies:   Patient has no known allergies.   Social History   Socioeconomic History  . Marital status: Widowed    Spouse name: Not on file  . Number of children: Not on file  . Years of education: Not on file  . Highest education level: Not on file  Occupational History  . Not on file  Social Needs  . Financial resource strain: Not on file  . Food insecurity:    Worry: Not on file    Inability: Not on file  . Transportation needs:    Medical: Not on file    Non-medical: Not on file  Tobacco Use  . Smoking status: Current Every Day Smoker    Packs/day: 0.25    Years: 41.00    Pack years: 10.25    Types: Cigarettes  . Smokeless tobacco: Never Used  Substance and Sexual Activity  . Alcohol use: Yes  Comment: 10/10/2015 "last drink was 2-3 months ago; h/o abuse"  . Drug use: Yes    Types: Marijuana    Comment: last use 2015  . Sexual activity: Not on file  Lifestyle  . Physical activity:    Days per week: Not on file    Minutes per session: Not on file  . Stress: Not on file  Relationships  . Social connections:    Talks on phone: Not on file    Gets together: Not on file    Attends religious service: Not on file    Active member of club or organization: Not on file    Attends meetings of clubs or organizations: Not on file    Relationship status: Not on file  Other Topics Concern  . Not on file  Social History Narrative  . Not on file     Family History:  The patient's family history includes Heart failure in his mother and sister; Hypertension in his sister and sister; Liver disease in his father.   ROS:   Please see the history of present illness.    ROS All other systems reviewed and are negative.   PHYSICAL EXAM:   VS:  BP 106/78 (BP Location: Left Arm, Patient Position: Sitting, Cuff Size: Large)   Pulse 87   Ht 5'  6" (1.676 m)   Wt 222 lb (100.7 kg)   BMI 35.83 kg/m     General: Alert, oriented x3, no distress, moderately obese Head: no evidence of trauma, PERRL, EOMI, no exophtalmos or lid lag, no myxedema, no xanthelasma; normal ears, nose and oropharynx Neck: normal jugular venous pulsations and no hepatojugular reflux; brisk carotid pulses without delay and no carotid bruits Chest: clear to auscultation, no signs of consolidation by percussion or palpation, normal fremitus, symmetrical and full respiratory excursions Cardiovascular: normal position and quality of the apical impulse, regular rhythm, normal first and second heart sounds, no murmurs, rubs or gallops Abdomen: no tenderness, mildly protuberant abdomen, not sure if this is due to obesity or ascites, no masses by palpation, no abnormal pulsatility or arterial bruits, normal bowel sounds, no hepatosplenomegaly Extremities: no clubbing, cyanosis; 1+ symmetrical ankle edema; 2+ radial, ulnar and brachial pulses bilaterally; 2+ right femoral, posterior tibial and dorsalis pedis pulses; 2+ left femoral, posterior tibial and dorsalis pedis pulses; no subclavian or femoral bruits Neurological: grossly nonfocal Psych: Normal mood and affect   Wt Readings from Last 3 Encounters:  02/28/18 222 lb (100.7 kg)  02/04/18 218 lb 9.6 oz (99.2 kg)  09/08/17 255 lb 11.7 oz (116 kg)      Studies/Labs Reviewed:   EKG:  EKG is ordered today. very similar to his previous tracing.  He shows atrial paced, ventricular sensed rhythm with old first-degree AV block, right bundle branch block, Q waves in inferior leads, prolonged QT due to RBBB (QRS 138 ms, QTC 585 ms)  Recent Labs: 04/04/2017: B Natriuretic Peptide 55.8 09/17/2017: ALT 33; BUN 5; Creatinine, Ser 0.88; Hemoglobin 16.8; Platelets 52; Potassium 4.4; Sodium 139   Lipid Panel    Component Value Date/Time   CHOL 111 10/11/2015 0138   TRIG 64 10/11/2015 0138   HDL 32 (L) 10/11/2015 0138    CHOLHDL 3.5 10/11/2015 0138   VLDL 13 10/11/2015 0138   LDLCALC 66 10/11/2015 0138    ASSESSMENT:    1. Chronic diastolic congestive heart failure ([place])   2. CAD of autologous vein bypass graft without angina   3. Paroxysmal atrial fibrillation (HCC)  4. Essential hypertension   5. Dyslipidemia   6. Pacemaker   7. Chronic obstructive pulmonary disease, unspecified COPD type ([place])   8. Hepatic cirrhosis, unspecified hepatic cirrhosis type, unspecified whether ascites present ([place])      PLAN:  In order of problems listed above:  1. CHF: Clinically euvolemic, NYHA functional class I.  I think he is doing a good job self-regulating his dose of diuretic.  We discussed the fact that excessive diuresis can lead to worsening hepatic encephalopathy, it seems that his "dry weight" on his home scale is 216 pounds (and our office weight overestimates that by about 6 pounds). 2. CAD s/p CABG 2004:Despite being quite active, he denies angina pectoris. Known occlusion of the vein graft bypass to the OM branch. Inferior scar without ischemia by nuclear perfusion testing in 2010. He is not receiving antiplatelet agents due to a history of gastric ulcer with bleeding requiring transfusion, background thrombocytopenia and coagulopathy. 3. WFU:XNATF he does have lengthy episodes of paroxysmal atrial fibrillation, up to 18 hours in duration, these are very infrequent.Marland Kitchen CHADSVasc 3 (HF, CAD, HTN), but with cirrhosis, coagulopathy, thrombocytopenia and GI bleeding requiring transfusion, bleeding risk is prohibitive. Continue metoprolol.  Rate control is appropriate during the episodes of atrial fibrillation 4. HTN: Excellent control even after stopping his thiazide diuretic.  If he develops symptoms of hypotension I would discontinue his losartan. 5. Low HDL: LDL at target <60. 6. PPM: This was implanted for second degree AV block, not long after acute MI, with subsequent recovery of atrioventricular  conduction.  Remote downloads every 3 months and yearly office visits 7. COPD/Hx of Smoking: Congratulated for quitting smoking permanently.  He has run out of his albuterol inhalers are refilled today. 8. Cirrhosis: Related to both hepatitis C (now virologically cured) and alcohol use.  Congratulated him on alcohol abstinence which should be a lifelong endeavor.    Medication Adjustments/Labs and Tests Ordered: Current medicines are reviewed at length with the patient today.  Concerns regarding medicines are outlined above.  Medication changes, Labs and Tests ordered today are listed in the Patient Instructions below. Patient Instructions  Medication Instructions: Dr Sallyanne Kuster recommends that you continue on your current medications as directed. Please refer to the Current Medication list given to you today.  FUTURE REFILLS OF ALBUTEROL MUST COME FROM YOUR PRIMARY CARE PROVIDER.  Labwork: Your physician recommends that you return for lab work at your convenience - FASTING.  Testing/Procedures: NONE ORDERED  Follow-up: Dr Sallyanne Kuster recommends that you schedule a follow-up appointment in 12 months. You will receive a reminder letter in the mail two months in advance. If you don't receive a letter, please call our office to schedule the follow-up appointment.  If you need a refill on your cardiac medications before your next appointment, please call your pharmacy.      Signed, Hunter Klein, MD  03/01/2018 9:45 AM    [facility] [place], [place], [place]  [address] Phone: [phone]; Fax: [phone]

## 2018-03-01 DIAGNOSIS — I25810 Atherosclerosis of coronary artery bypass graft(s) without angina pectoris: Secondary | ICD-10-CM | POA: Insufficient documentation

## 2018-03-11 ENCOUNTER — Other Ambulatory Visit: Payer: Self-pay

## 2018-03-11 ENCOUNTER — Encounter: Payer: Self-pay | Admitting: Family Medicine

## 2018-03-11 ENCOUNTER — Ambulatory Visit (INDEPENDENT_AMBULATORY_CARE_PROVIDER_SITE_OTHER): Payer: Medicare Other | Admitting: Family Medicine

## 2018-03-11 VITALS — BP 132/80 | HR 78 | Temp 98.5°F | Ht 66.0 in | Wt 224.0 lb

## 2018-03-11 DIAGNOSIS — I25118 Atherosclerotic heart disease of native coronary artery with other forms of angina pectoris: Secondary | ICD-10-CM

## 2018-03-11 DIAGNOSIS — K219 Gastro-esophageal reflux disease without esophagitis: Secondary | ICD-10-CM

## 2018-03-11 DIAGNOSIS — Z0000 Encounter for general adult medical examination without abnormal findings: Secondary | ICD-10-CM

## 2018-03-11 DIAGNOSIS — I10 Essential (primary) hypertension: Secondary | ICD-10-CM

## 2018-03-11 MED ORDER — OMEPRAZOLE 20 MG PO CPDR: 20.0000 mg | DELAYED_RELEASE_CAPSULE | Freq: Every day | ORAL | 3 refills | Status: DC

## 2018-03-11 MED ORDER — FUROSEMIDE 40 MG PO TABS: 40.0000 mg | ORAL_TABLET | ORAL | 0 refills | Status: DC | PRN

## 2018-03-11 MED ORDER — ALBUTEROL SULFATE HFA 108 (90 BASE) MCG/ACT IN AERS: 2.0000 | INHALATION_SPRAY | RESPIRATORY_TRACT | 0 refills | Status: DC | PRN

## 2018-03-11 MED ORDER — TETANUS-DIPHTH-ACELL PERTUSSIS 5-2.5-18.5 LF-MCG/0.5 IM SUSP: 0.5000 mL | Freq: Once | INTRAMUSCULAR | 0 refills | Status: DC

## 2018-03-11 MED ORDER — TETANUS-DIPHTH-ACELL PERTUSSIS 5-2.5-18.5 LF-MCG/0.5 IM SUSP: 0.5000 mL | Freq: Once | INTRAMUSCULAR | 0 refills | Status: AC

## 2018-03-11 MED ORDER — SIMVASTATIN 20 MG PO TABS: 20.0000 mg | ORAL_TABLET | Freq: Every day | ORAL | 0 refills | Status: DC

## 2018-03-11 MED ORDER — METOPROLOL TARTRATE 50 MG PO TABS: 50.0000 mg | ORAL_TABLET | Freq: Two times a day (BID) | ORAL | 2 refills | Status: DC

## 2018-03-11 MED ORDER — LOSARTAN POTASSIUM 25 MG PO TABS: 25.0000 mg | ORAL_TABLET | Freq: Every day | ORAL | 0 refills | Status: DC

## 2018-03-11 NOTE — Assessment & Plan Note (Signed)
BP is currently below goal of 140/90 on current medications.  Cont current medications. Refilled lasix 40mg  daily, losartan  25mg  daily, and metoprolol 50mg  BID.  Temporary increase in Furosemide to 40mg  BID for 3 days only to help with ankle swelling.

## 2018-03-11 NOTE — Assessment & Plan Note (Signed)
Lipid panel today due to history of CAD and has had no lipid check in over 1 year.  Cont Simvastatin 20mg  daily. Will reassess based on lipid panel.

## 2018-03-11 NOTE — Assessment & Plan Note (Signed)
Suspect his epigastric pain is due to GERD.  Trial of Omeprazole 20mg  daily to see if symptoms resolve.

## 2018-03-11 NOTE — Progress Notes (Signed)
Subjective: Chief Complaint  Patient presents with  . Hypertension  . bloodwork    HPI: Hunter Carter is a 67 y.o. presenting to clinic today to discuss the following:  HTN Follow up Patient presents for follow up of high blood pressure. He states he has been doing water aerobic exercises at the [facility] 3 times per week. He enjoys doing it and is willing to increase his frequency to 4 times per week. Patient has been compliant with taking Metoprolol, Losartan, and Furosemide daily in the morning. BP well controlled at today's check.  Diarrhea Patient continues to have diarrhea and believes the lactulose is causing it. Discussed with him this is likely the cause. He will continue to take it BID. He wears adult diapers at times when he is going out as a precaution but states he is able to get to the bathroom on time. He does not it is not keeping him from participating in daily activities like church band, exercise, and visiting friends however his diarrhea does make these tasks more difficulty as he often has to abruptly leave because "when I have to go, I have to go".  Healthcare Maintenance Tetanus is due, prescription sent to pharmacy as cannot perform here as medicare will not reimburse.  Epigastric pain Patient reports he feels a "knot". He has had this knot for awhile and it has not changed. He has noticed about once a week a "burning" type pain in his epigastric area that lasts for about 15 minutes. It gets as bad as an 8/10. Patient states he had noticed it at night and he has cut down on greasy foods and eating late at night with some improvement. It hurts when he sits up and tends to get better when he lays down.  Review of Systems  Constitutional: Negative for chills, fever and weight loss.  HENT: Negative for congestion.   Respiratory: Negative for cough, shortness of breath and wheezing.   Cardiovascular: Negative for chest pain, palpitations, orthopnea and PND.    Gastrointestinal: Positive for abdominal pain, diarrhea, heartburn and nausea. Negative for blood in stool, constipation, melena and vomiting.       Noticed small amount of pink on toilet paper after wiping but no gross blood in stool  Genitourinary: Negative for dysuria, frequency and urgency.  Skin: Negative for itching and rash.  Neurological: Positive for headaches. Negative for dizziness, tingling, tremors, sensory change, speech change and weakness.   Health Maintenance: TDap due    ROS noted in HPI.   Past Medical, Surgical, Social, and Family History Reviewed & Updated per EMR.   Pertinent Historical Findings include:   Social History   Tobacco Use  Smoking Status Former Smoker  . Packs/day: 0.25  . Years: 41.00  . Pack years: 10.25  . Types: Cigarettes  . Last attempt to quit: 11/15/2017  . Years since quitting: 0.3  Smokeless Tobacco Never Used    Objective: BP 132/80   Pulse 78   Temp 98.5 F (36.9 C) (Oral)   Ht 5\' 6"  (1.676 m)   Wt 224 lb (101.6 kg)   SpO2 96%   BMI 36.15 kg/m  Vitals and nursing notes reviewed  Physical Exam  Constitutional: He is oriented to person, place, and time. He appears well-developed and well-nourished. No distress.  HENT:  Head: Normocephalic and atraumatic.  Eyes: Pupils are equal, round, and reactive to light. EOM are normal. No scleral icterus.  Neck: Normal range of motion.  Cardiovascular:  Normal rate, regular rhythm, normal heart sounds and intact distal pulses.  No murmur heard. Pulmonary/Chest: Effort normal and breath sounds normal. No respiratory distress. He has no wheezes. He has no rales.  Abdominal: Soft. Bowel sounds are normal. There is tenderness. There is no rebound and no guarding. No hernia.  Mild distention and hepatomegaly. No fluid wave. Generalized mild TTP in all quadrants. Midline epigastric wall bulging but no pain on palpation, no palpable hernia  Musculoskeletal: Normal range of motion. He  exhibits edema.  +2 pitting edema bilaterally in the ankles  Neurological: He is alert and oriented to person, place, and time.  Skin: Skin is warm and dry. No rash noted.    No results found for this or any previous visit (from the past 72 hour(s)).  Assessment/Plan:  Healthcare maintenance Tetanus prescription sent to pharmacy  HTN (hypertension) BP is currently below goal of 140/90 on current medications.  Cont current medications. Refilled lasix 40mg  daily, losartan  25mg  daily, and metoprolol 50mg  BID.  Temporary increase in Furosemide to 40mg  BID for 3 days only to help with ankle swelling.  CAD s/p CABG 2004 Lipid panel today due to history of CAD and has had no lipid check in over 1 year.  Cont Simvastatin 20mg  daily. Will reassess based on lipid panel.  Gastroesophageal reflux disease Suspect his epigastric pain is due to GERD.  Trial of Omeprazole 20mg  daily to see if symptoms resolve.   PATIENT EDUCATION PROVIDED: See AVS    Diagnosis and plan along with any newly prescribed medication(s) were discussed in detail with this patient today. The patient verbalized understanding and agreed with the plan. Patient advised if symptoms worsen return to clinic or ER.   Health Maintainance:   Orders Placed This Encounter  Procedures  . CBC  . Lipid Panel    Meds ordered this encounter  Medications  . furosemide (LASIX) 40 MG tablet    Sig: Take 1 tablet (40 mg total) by mouth as needed. Please make appointment.    Dispense:  90 tablet    Refill:  0  . losartan (COZAAR) 25 MG tablet    Sig: Take 1 tablet (25 mg total) by mouth daily. KEEP OV.    Dispense:  90 tablet    Refill:  0  . metoprolol tartrate (LOPRESSOR) 50 MG tablet    Sig: Take 1 tablet (50 mg total) by mouth 2 (two) times daily.    Dispense:  60 tablet    Refill:  2  . simvastatin (ZOCOR) 20 MG tablet    Sig: Take 1 tablet (20 mg total) by mouth daily. Please make appointment.    Dispense:  90  tablet    Refill:  0  . albuterol (PROVENTIL HFA;VENTOLIN HFA) 108 (90 Base) MCG/ACT inhaler    Sig: Inhale 2 puffs into the lungs every 4 (four) hours as needed for wheezing or shortness of breath (or coughing).    Dispense:  1 Inhaler    Refill:  0  . omeprazole (PRILOSEC) 20 MG capsule    Sig: Take 1 capsule (20 mg total) by mouth daily.    Dispense:  30 capsule    Refill:  3  . DISCONTD: Tdap (BOOSTRIX) 5-2.5-18.5 LF-MCG/0.5 injection    Sig: Inject 0.5 mLs into the muscle once for 1 dose.    Dispense:  0.5 mL    Refill:  0  . Tdap (BOOSTRIX) 5-2.5-18.5 LF-MCG/0.5 injection    Sig: Inject 0.5 mLs into the  muscle once for 1 dose.    Dispense:  0.5 mL    Refill:  0    Harolyn Rutherford, DO 03/11/2018, 9:07 AM PGY-1, [facility]

## 2018-03-11 NOTE — Patient Instructions (Signed)
It was great to see you today! Thank you for letting me participate in your care!  Today, we discussed your overall health and blood pressure. Please continue to take your blood pressure medications as prescribed as your blood pressure is well controlled. Please take Furosemide twice a day for 3 days ONLY to help with your ankle swelling. Please continue to elevated your feet at night and wear compression stockings.  I have also started you on omeprazole for suspected heartburn. Please give this medication two weeks to begin seeing complete remission in symptoms.  I have refilled your regular medications.  Be well, Hunter Rutherford, DO PGY-1, [facility]

## 2018-03-11 NOTE — Assessment & Plan Note (Signed)
Tetanus prescription sent to pharmacy

## 2018-03-12 DIAGNOSIS — B079 Viral wart, unspecified: Secondary | ICD-10-CM | POA: Diagnosis not present

## 2018-03-12 LAB — LIPID PANEL
CHOL/HDL RATIO: 1.9 ratio (ref 0.0–5.0)
Cholesterol, Total: 103 mg/dL (ref 100–199)
HDL: 55 mg/dL (ref >39)
LDL Calculated: 32 mg/dL (ref 0–99)
TRIGLYCERIDES: 82 mg/dL (ref 0–149)
VLDL Cholesterol Cal: 16 mg/dL (ref 5–40)

## 2018-03-12 LAB — CBC
HEMATOCRIT: 43.2 % (ref 37.5–51.0)
Hemoglobin: 14.4 g/dL (ref 13.0–17.7)
MCH: 33 pg (ref 26.6–33.0)
MCHC: 33.3 g/dL (ref 31.5–35.7)
MCV: 99 fL — ABNORMAL HIGH (ref 79–97)
Platelets: 49 10*3/uL — CL (ref 150–450)
RBC: 4.37 x10E6/uL (ref 4.14–5.80)
RDW: 14.4 % (ref 12.3–15.4)
WBC: 2.5 10*3/uL — AB (ref 3.4–10.8)

## 2018-03-13 ENCOUNTER — Telehealth: Payer: Self-pay | Admitting: *Deleted

## 2018-03-13 ENCOUNTER — Telehealth: Payer: Self-pay

## 2018-03-13 ENCOUNTER — Other Ambulatory Visit: Payer: Self-pay | Admitting: *Deleted

## 2018-03-13 NOTE — Telephone Encounter (Signed)
Christine- RN with [facility] called regarding pt. He is in [facility] heart program. RN calling to report patient has had a 10.8lb weight loss over 9 days, and c/o yellow tinged sputum. Requesting we contact patient. Pt's call back infor [phone] Wallace Cullens, RN

## 2018-03-13 NOTE — Telephone Encounter (Signed)
Needs tdap rx sent to local pharmacy in his chart and not [facility]. Please resend. Cipriano [place] Kennon Holter, CMA

## 2018-03-14 ENCOUNTER — Encounter: Payer: Self-pay | Admitting: Family Medicine

## 2018-03-14 ENCOUNTER — Other Ambulatory Visit: Payer: Self-pay | Admitting: Family Medicine

## 2018-03-14 MED ORDER — TETANUS-DIPHTH-ACELL PERTUSSIS 5-2.5-18.5 LF-MCG/0.5 IM SUSP: 0.5000 mL | Freq: Once | INTRAMUSCULAR | 0 refills | Status: AC

## 2018-03-14 NOTE — Telephone Encounter (Signed)
Attempted to call patient but no answer and went to voicemail. Attempted to leave voicemail but mailbox was full and unable to leave message.

## 2018-03-14 NOTE — Progress Notes (Signed)
Called to inform patient of lab results but no answer and VM was full so unable to leave message

## 2018-03-14 NOTE — Telephone Encounter (Signed)
Sent to correct pharmacy

## 2018-03-14 NOTE — Progress Notes (Signed)
Ordered wrong TDAP to pharmacy. Entering in correct TDAP.

## 2018-03-19 DIAGNOSIS — R410 Disorientation, unspecified: Secondary | ICD-10-CM | POA: Diagnosis not present

## 2018-03-19 DIAGNOSIS — B078 Other viral warts: Secondary | ICD-10-CM | POA: Diagnosis not present

## 2018-03-20 ENCOUNTER — Emergency Department (HOSPITAL_COMMUNITY): Payer: Medicare Other

## 2018-03-20 ENCOUNTER — Observation Stay (HOSPITAL_COMMUNITY)
Admission: EM | Admit: 2018-03-20 | Discharge: 2018-03-22 | Disposition: A | Discharge status: Home / Self Care | Payer: Medicare Other | Attending: Internal Medicine | Admitting: Internal Medicine

## 2018-03-20 ENCOUNTER — Encounter (HOSPITAL_COMMUNITY): Payer: Self-pay | Admitting: Emergency Medicine

## 2018-03-20 ENCOUNTER — Observation Stay (HOSPITAL_COMMUNITY): Payer: Medicare Other

## 2018-03-20 DIAGNOSIS — Z950 Presence of cardiac pacemaker: Secondary | ICD-10-CM | POA: Insufficient documentation

## 2018-03-20 DIAGNOSIS — Z96652 Presence of left artificial knee joint: Secondary | ICD-10-CM | POA: Diagnosis not present

## 2018-03-20 DIAGNOSIS — R1111 Vomiting without nausea: Secondary | ICD-10-CM | POA: Diagnosis not present

## 2018-03-20 DIAGNOSIS — I700 Atherosclerosis of aorta: Secondary | ICD-10-CM | POA: Insufficient documentation

## 2018-03-20 DIAGNOSIS — I110 Hypertensive heart disease with heart failure: Secondary | ICD-10-CM | POA: Diagnosis not present

## 2018-03-20 DIAGNOSIS — K7290 Hepatic failure, unspecified without coma: Secondary | ICD-10-CM | POA: Diagnosis not present

## 2018-03-20 DIAGNOSIS — Z951 Presence of aortocoronary bypass graft: Secondary | ICD-10-CM | POA: Diagnosis not present

## 2018-03-20 DIAGNOSIS — K8020 Calculus of gallbladder without cholecystitis without obstruction: Secondary | ICD-10-CM | POA: Diagnosis not present

## 2018-03-20 DIAGNOSIS — B1920 Unspecified viral hepatitis C without hepatic coma: Secondary | ICD-10-CM | POA: Diagnosis not present

## 2018-03-20 DIAGNOSIS — R4182 Altered mental status, unspecified: Secondary | ICD-10-CM | POA: Diagnosis present

## 2018-03-20 DIAGNOSIS — F1020 Alcohol dependence, uncomplicated: Secondary | ICD-10-CM | POA: Diagnosis not present

## 2018-03-20 DIAGNOSIS — E785 Hyperlipidemia, unspecified: Secondary | ICD-10-CM | POA: Insufficient documentation

## 2018-03-20 DIAGNOSIS — K7460 Unspecified cirrhosis of liver: Secondary | ICD-10-CM | POA: Insufficient documentation

## 2018-03-20 DIAGNOSIS — I2510 Atherosclerotic heart disease of native coronary artery without angina pectoris: Secondary | ICD-10-CM | POA: Diagnosis present

## 2018-03-20 DIAGNOSIS — G9341 Metabolic encephalopathy: Secondary | ICD-10-CM

## 2018-03-20 DIAGNOSIS — Z7982 Long term (current) use of aspirin: Secondary | ICD-10-CM | POA: Diagnosis not present

## 2018-03-20 DIAGNOSIS — R29810 Facial weakness: Secondary | ICD-10-CM | POA: Diagnosis not present

## 2018-03-20 DIAGNOSIS — D696 Thrombocytopenia, unspecified: Secondary | ICD-10-CM | POA: Insufficient documentation

## 2018-03-20 DIAGNOSIS — K7469 Other cirrhosis of liver: Secondary | ICD-10-CM | POA: Diagnosis not present

## 2018-03-20 DIAGNOSIS — D72819 Decreased white blood cell count, unspecified: Secondary | ICD-10-CM | POA: Insufficient documentation

## 2018-03-20 DIAGNOSIS — E7220 Disorder of urea cycle metabolism, unspecified: Secondary | ICD-10-CM | POA: Insufficient documentation

## 2018-03-20 DIAGNOSIS — R0602 Shortness of breath: Secondary | ICD-10-CM | POA: Diagnosis not present

## 2018-03-20 DIAGNOSIS — E876 Hypokalemia: Secondary | ICD-10-CM | POA: Diagnosis not present

## 2018-03-20 DIAGNOSIS — R1110 Vomiting, unspecified: Secondary | ICD-10-CM | POA: Diagnosis not present

## 2018-03-20 DIAGNOSIS — K219 Gastro-esophageal reflux disease without esophagitis: Secondary | ICD-10-CM | POA: Insufficient documentation

## 2018-03-20 DIAGNOSIS — J449 Chronic obstructive pulmonary disease, unspecified: Secondary | ICD-10-CM | POA: Insufficient documentation

## 2018-03-20 DIAGNOSIS — R410 Disorientation, unspecified: Secondary | ICD-10-CM | POA: Diagnosis not present

## 2018-03-20 DIAGNOSIS — K7200 Acute and subacute hepatic failure without coma: Secondary | ICD-10-CM | POA: Diagnosis not present

## 2018-03-20 DIAGNOSIS — I471 Supraventricular tachycardia: Secondary | ICD-10-CM | POA: Diagnosis present

## 2018-03-20 DIAGNOSIS — I480 Paroxysmal atrial fibrillation: Secondary | ICD-10-CM | POA: Insufficient documentation

## 2018-03-20 DIAGNOSIS — R531 Weakness: Secondary | ICD-10-CM | POA: Diagnosis not present

## 2018-03-20 DIAGNOSIS — I252 Old myocardial infarction: Secondary | ICD-10-CM | POA: Insufficient documentation

## 2018-03-20 DIAGNOSIS — Z87891 Personal history of nicotine dependence: Secondary | ICD-10-CM | POA: Insufficient documentation

## 2018-03-20 DIAGNOSIS — K7682 Hepatic encephalopathy: Secondary | ICD-10-CM | POA: Diagnosis present

## 2018-03-20 DIAGNOSIS — Z8711 Personal history of peptic ulcer disease: Secondary | ICD-10-CM | POA: Diagnosis not present

## 2018-03-20 DIAGNOSIS — I25810 Atherosclerosis of coronary artery bypass graft(s) without angina pectoris: Secondary | ICD-10-CM | POA: Insufficient documentation

## 2018-03-20 DIAGNOSIS — I5032 Chronic diastolic (congestive) heart failure: Secondary | ICD-10-CM | POA: Diagnosis not present

## 2018-03-20 LAB — CBC
HCT: 47.1 % (ref 39.0–52.0)
Hemoglobin: 16.2 g/dL (ref 13.0–17.0)
MCH: 32.9 pg (ref 26.0–34.0)
MCHC: 34.4 g/dL (ref 30.0–36.0)
MCV: 95.7 fL (ref 78.0–100.0)
Platelets: 48 10*3/uL — ABNORMAL LOW (ref 150–400)
RBC: 4.92 MIL/uL (ref 4.22–5.81)
RDW: 14.3 % (ref 11.5–15.5)
WBC: 3.5 10*3/uL — ABNORMAL LOW (ref 4.0–10.5)

## 2018-03-20 LAB — COMPREHENSIVE METABOLIC PANEL
ALBUMIN: 2.5 g/dL — AB (ref 3.5–5.0)
ALT: 34 U/L (ref 17–63)
ANION GAP: 6 (ref 5–15)
AST: 58 U/L — AB (ref 15–41)
Alkaline Phosphatase: 118 U/L (ref 38–126)
BUN: 10 mg/dL (ref 6–20)
CALCIUM: 8.3 mg/dL — AB (ref 8.9–10.3)
CO2: 28 mmol/L (ref 22–32)
Chloride: 107 mmol/L (ref 101–111)
Creatinine, Ser: 0.93 mg/dL (ref 0.61–1.24)
GFR calc Af Amer: >60 mL/min (ref >60)
GFR calc non Af Amer: >60 mL/min (ref >60)
GLUCOSE: 124 mg/dL — AB (ref 65–99)
POTASSIUM: 3.7 mmol/L (ref 3.5–5.1)
SODIUM: 141 mmol/L (ref 135–145)
Total Bilirubin: 2.4 mg/dL — ABNORMAL HIGH (ref 0.3–1.2)
Total Protein: 7.2 g/dL (ref 6.5–8.1)

## 2018-03-20 LAB — PROTIME-INR
INR: 1.63
Prothrombin Time: 19.1 seconds — ABNORMAL HIGH (ref 11.4–15.2)

## 2018-03-20 LAB — CBG MONITORING, ED: Glucose-Capillary: 84 mg/dL (ref 65–99)

## 2018-03-20 LAB — AMMONIA: Ammonia: 209 umol/L — ABNORMAL HIGH (ref 9–35)

## 2018-03-20 MED ORDER — SODIUM CHLORIDE 0.9 % IV SOLN
2.0000 g | Freq: Every day | INTRAVENOUS | Status: DC
  Administered 2018-03-21 (×2): 2 g via INTRAVENOUS
  Filled 2018-03-20: qty 2
  Filled 2018-03-20: qty 20
  Filled 2018-03-20: qty 2

## 2018-03-20 MED ORDER — ACETAMINOPHEN 650 MG RE SUPP: 650.0000 mg | Freq: Four times a day (QID) | RECTAL | Status: DC | PRN

## 2018-03-20 MED ORDER — METOPROLOL TARTRATE 50 MG PO TABS
50.0000 mg | ORAL_TABLET | Freq: Two times a day (BID) | ORAL | Status: DC
  Administered 2018-03-21 – 2018-03-22 (×3): 50 mg via ORAL
  Filled 2018-03-20 (×3): qty 1

## 2018-03-20 MED ORDER — ONDANSETRON HCL 4 MG PO TABS
4.0000 mg | ORAL_TABLET | Freq: Four times a day (QID) | ORAL | Status: DC | PRN
  Administered 2018-03-22: 4 mg via ORAL
  Filled 2018-03-20: qty 1

## 2018-03-20 MED ORDER — LACTULOSE 10 GM/15ML PO SOLN
20.0000 g | Freq: Once | ORAL | Status: AC
  Administered 2018-03-20: 20 g via ORAL
  Filled 2018-03-20: qty 30

## 2018-03-20 MED ORDER — LOSARTAN POTASSIUM 25 MG PO TABS
25.0000 mg | ORAL_TABLET | Freq: Every day | ORAL | Status: DC
  Administered 2018-03-21 – 2018-03-22 (×2): 25 mg via ORAL
  Filled 2018-03-20 (×2): qty 1

## 2018-03-20 MED ORDER — ALBUTEROL SULFATE HFA 108 (90 BASE) MCG/ACT IN AERS: 2.0000 | INHALATION_SPRAY | RESPIRATORY_TRACT | Status: DC | PRN

## 2018-03-20 MED ORDER — PANTOPRAZOLE SODIUM 40 MG PO TBEC
40.0000 mg | DELAYED_RELEASE_TABLET | Freq: Every day | ORAL | Status: DC
  Administered 2018-03-21 – 2018-03-22 (×2): 40 mg via ORAL
  Filled 2018-03-20 (×2): qty 1

## 2018-03-20 MED ORDER — SIMVASTATIN 20 MG PO TABS
20.0000 mg | ORAL_TABLET | Freq: Every day | ORAL | Status: DC
Start: 2018-03-21 — End: 2018-03-22
  Administered 2018-03-21 – 2018-03-22 (×2): 20 mg via ORAL
  Filled 2018-03-20 (×2): qty 1

## 2018-03-20 MED ORDER — ACETAMINOPHEN 325 MG PO TABS: 650.0000 mg | ORAL_TABLET | Freq: Four times a day (QID) | ORAL | Status: DC | PRN

## 2018-03-20 MED ORDER — LACTULOSE 10 GM/15ML PO SOLN
30.0000 g | Freq: Three times a day (TID) | ORAL | Status: DC
  Administered 2018-03-21 – 2018-03-22 (×4): 30 g via ORAL
  Filled 2018-03-20 (×4): qty 60

## 2018-03-20 MED ORDER — IOHEXOL 300 MG/ML  SOLN
30.0000 mL | Freq: Once | INTRAMUSCULAR | Status: AC | PRN
  Administered 2018-03-20: 30 mL via ORAL

## 2018-03-20 MED ORDER — ONDANSETRON HCL 4 MG/2ML IJ SOLN
4.0000 mg | Freq: Four times a day (QID) | INTRAMUSCULAR | Status: DC | PRN
  Administered 2018-03-21: 4 mg via INTRAVENOUS
  Filled 2018-03-20: qty 2

## 2018-03-20 MED ORDER — ALBUTEROL SULFATE (2.5 MG/3ML) 0.083% IN NEBU: 2.5000 mg | INHALATION_SOLUTION | RESPIRATORY_TRACT | Status: DC | PRN

## 2018-03-20 NOTE — ED Provider Notes (Signed)
[facility] DEPT Provider Note   CSN: [id number] Arrival date & time: 03/20/18  1343     History   Chief Complaint Chief Complaint  Patient presents with  . Altered Mental Status  . Abdominal Pain    HPI Hunter Carter is a 67 y.o. male.  Pt presents to the ED today with mental status change.  Family said pt has been weak and sleepy.  He has been drooling and not acting his normal self.  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  He has a hx of hepatic encephalopathy in the past.  He has had confusion for the past 3 days.  He did see his liver doctor today who recommended family take pt into the ED to check his ammonia level.  Pt has been compliant with his lactulose.      Past Medical History:  Diagnosis Date  . Acute hepatic encephalopathy 10/10/2015   [name] 10/10/2015  . Arthritis    "joints ache" (10/10/2015)  . Bleeding stomach ulcer    "recently" (10/10/2015)  . CAD (coronary artery disease)   . CHF (congestive heart failure) ([place])   . Cirrhosis ([place])    due to HCV/notes 10/10/2015  . ETOH abuse   . GERD (gastroesophageal reflux disease)   . Heart block AV second degree    permanent pacemaker 02/2004  . Hepatitis C   . Hyperlipidemia   . Inferior MI ([place]) 2005  . Presence of permanent cardiac pacemaker   . RBBB   . Systemic hypertension   . Tubulovillous adenoma of colon   . Ventricular tachycardia ([place])    nonsustained    Patient Active Problem List   Diagnosis Date Noted  . Gastroesophageal reflux disease 03/11/2018  . CAD of autologous vein bypass graft without angina 03/01/2018  . Healthcare maintenance 02/13/2018  . Skin lesion 02/13/2018  . Cirrhosis ([place]) 08/08/2016  . Paroxysmal atrial fibrillation ([place]) 01/05/2016  . Tobacco abuse 01/05/2016  . Hepatic encephalopathy ([place]) 10/10/2015  . Pain, joint, ankle, left 10/10/2015  . Thrombocytopathia ([place]) 10/10/2015  . HLD (hyperlipidemia) 10/10/2015  . Chronic  diastolic congestive heart failure ([place]) 10/10/2015  . Altered mental status   . Increased ammonia level   . Leg swelling   . Encephalopathy acute 08/01/2015  . GI bleed 07/29/2015  . Acute GI bleeding 07/29/2015  . Acute respiratory failure ([place])   . Bleeding gastrointestinal   . Hemorrhagic shock ([place])   . CAD s/p CABG 2004 07/20/2013  . Pacemaker - dual chamber Medtronic 2011 07/20/2013  . Hepatitis C 07/20/2013  . Hyperlipidemia 07/20/2013  . PAT (paroxysmal atrial tachycardia) ([place]) 07/20/2013  . Obesity (BMI 30-39.9) 07/20/2013  . NSVT (nonsustained ventricular tachycardia) ([place]) 07/20/2013  . HTN (hypertension) 07/16/2013    Past Surgical History:  Procedure Laterality Date  . CARDIAC CATHETERIZATION  03/06/04   significant 3 vessel disease, totalled graft obtuse marginal  . CORONARY ARTERY BYPASS GRAFT  02/10/03   LIMA to LAD,SVG to first diagonal, SVT to OM1  . ESOPHAGOGASTRODUODENOSCOPY N/A 07/29/2015   Procedure: ESOPHAGOGASTRODUODENOSCOPY (EGD);  Surgeon: Clarene Essex, MD;  Location: [facility] ENDOSCOPY;  Service: Endoscopy;  Laterality: N/A;  . INSERT / REPLACE / REMOVE PACEMAKER  03/2010   Medtronic  . KNEE ARTHROSCOPY Left [phone]  . MEDIASTINAL EXPLORATION  02/10/2003  . NM MYOCAR PERF WALL MOTION  04/05/09   low risk scan-abnormal-mod. perfusion defect basal inferoseptal,basal inferior,mid inferoseptal,mid inferior and apical inferior regions  . TOTAL KNEE  ARTHROPLASTY Left   . US ECHOCARDIOGRAPHY  01/20/07   mild MR,mild to mod. TR,trace AI,mild PI        Home Medications    Prior to Admission medications   Medication Sig Start Date End Date Taking? Authorizing Provider  albuterol (PROVENTIL HFA;VENTOLIN HFA) 108 (90 Base) MCG/ACT inhaler Inhale 2 puffs into the lungs every 4 (four) hours as needed for wheezing or shortness of breath (or coughing). 03/11/18  Yes Nuala Alpha, DO  aspirin 81 MG chewable tablet Chew 81 mg by mouth daily.   Yes [provider]  furosemide (LASIX) 40 MG tablet Take 1 tablet (40 mg total) by mouth as needed. Please make appointment. 03/11/18  Yes Lockamy, Timothy, DO  lactulose (CHRONULAC) 10 GM/15ML solution Take 45 mLs (30 g total) by mouth 2 (two) times daily as needed for mild constipation. Titrate for 3-4 BM's/ day 02/12/18  Yes Lockamy, Timothy, DO  losartan (COZAAR) 25 MG tablet Take 1 tablet (25 mg total) by mouth daily. KEEP OV. 03/11/18  Yes Lockamy, Timothy, DO  metoprolol tartrate (LOPRESSOR) 50 MG tablet Take 1 tablet (50 mg total) by mouth 2 (two) times daily. 03/11/18  Yes Lockamy, Timothy, DO  omeprazole (PRILOSEC) 20 MG capsule Take 1 capsule (20 mg total) by mouth daily. 03/11/18  Yes Lockamy, Timothy, DO  simvastatin (ZOCOR) 20 MG tablet Take 1 tablet (20 mg total) by mouth daily. Please make appointment. 03/11/18  Yes Lockamy, Timothy, DO  ondansetron (ZOFRAN ODT) 4 MG disintegrating tablet Take 1 tablet (4 mg total) by mouth every 8 (eight) hours as needed for nausea or vomiting. Patient not taking: Reported on 03/20/2018 02/15/17   Blanchie Dessert, MD    Family History Family History  Problem Relation Age of Onset  . Heart failure Mother   . Liver disease Father   . Heart failure Sister   . Hypertension Sister   . Hypertension Sister   . Colon cancer Neg Hx     Social History Social History   Tobacco Use  . Smoking status: Former Smoker    Packs/day: 0.25    Years: 41.00    Pack years: 10.25    Types: Cigarettes    Last attempt to quit: 11/15/2017    Years since quitting: 0.3  . Smokeless tobacco: Never Used  Substance Use Topics  . Alcohol use: Yes    Comment: 10/10/2015 "last drink was 2-3 months ago; h/o abuse"  . Drug use: Yes    Types: Marijuana    Comment: last use 2015     Allergies   Patient has no known allergies.   Review of Systems Review of Systems  Neurological: Positive for weakness.  All other systems reviewed and are negative.    Physical  Exam Updated Vital Signs BP 134/90   Pulse 62   Temp 98.3 F (36.8 C) (Oral)   Resp (!) 24   Ht 5\' 6"  (1.676 m)   Wt 98.4 kg (217 lb)   SpO2 99%   BMI 35.02 kg/m   Physical Exam  Constitutional: He appears well-developed and well-nourished.  HENT:  Head: Normocephalic and atraumatic.  Mouth/Throat: Oropharynx is clear and moist.  Eyes: Pupils are equal, round, and reactive to light. EOM are normal.  Cardiovascular: Normal rate and regular rhythm.  Pulmonary/Chest: Effort normal and breath sounds normal.  Abdominal: Bowel sounds are normal. He exhibits fluid wave and ascites.  Neurological: He is alert.  Slow to respond, but moving all 4 extremities.  Skin: Skin is warm. Capillary refill takes less than 2 seconds.  Psychiatric: He has a normal mood and affect.  Nursing note and vitals reviewed.    ED Treatments / Results  Labs (all labs ordered are listed, but only abnormal results are displayed) Labs Reviewed  COMPREHENSIVE METABOLIC PANEL - Abnormal; Notable for the following components:      Result Value   Glucose, Bld 124 (*)    Calcium 8.3 (*)    Albumin 2.5 (*)    AST 58 (*)    Total Bilirubin 2.4 (*)    All other components within normal limits  CBC - Abnormal; Notable for the following components:   WBC 3.5 (*)    Platelets 48 (*)    All other components within normal limits  AMMONIA - Abnormal; Notable for the following components:   Ammonia 209 (*)    All other components within normal limits  PROTIME-INR - Abnormal; Notable for the following components:   Prothrombin Time 19.1 (*)    All other components within normal limits  CBG MONITORING, ED    EKG None  Radiology Dg Chest 2 View  Result Date: 03/20/2018 CLINICAL DATA:  67 year old male with history of liver disease. Confusion. Shortness of breath. EXAM: CHEST - 2 VIEW COMPARISON:  09/07/2017 and earlier. FINDINGS: Semi upright AP and lateral views of the chest. Stable cardiomegaly and  mediastinal contours. Lung volumes are within normal limits. Prior CABG. Stable left chest cardiac pacemaker. Visualized tracheal air column is within normal limits. No pneumothorax, pulmonary edema, pleural effusion or confluent pulmonary opacity. Negative visible bowel gas pattern. No acute osseous abnormality identified. IMPRESSION: No acute cardiopulmonary abnormality. Electronically Signed   By: Genevie Ann M.D.   On: 03/20/2018 19:20   Ct Head Wo Contrast  Result Date: 03/20/2018 CLINICAL DATA:  67 year old male with history of liver disease. Confusion. EXAM: CT HEAD WITHOUT CONTRAST TECHNIQUE: Contiguous axial images were obtained from the base of the skull through the vertex without intravenous contrast. COMPARISON:  Head CT without contrast 09/07/2017. FINDINGS: Brain: Stable cerebral volume. No midline shift, ventriculomegaly, mass effect, evidence of mass lesion, intracranial hemorrhage or evidence of cortically based acute infarction. Patchy and confluent bilateral cerebral white matter hypodensity appears stable. Vascular: Calcified atherosclerosis at the skull base. No suspicious intracranial vascular hyperdensity. Skull: No acute osseous abnormality identified. Sinuses/Orbits: Visualized paranasal sinuses and mastoids are stable and well pneumatized. Other: No acute orbit or scalp soft tissue findings. IMPRESSION: 1.  No acute intracranial abnormality. 2. Stable non contrast CT appearance of the brain with moderate nonspecific cerebral white matter changes. Electronically Signed   By: Genevie Ann M.D.   On: 03/20/2018 19:19    Procedures Procedures (including critical care time)  Medications Ordered in ED Medications  lactulose (CHRONULAC) 10 GM/15ML solution 20 g (20 g Oral Given 03/20/18 2044)     Initial Impression / Assessment and Plan / ED Course  I have reviewed the triage vital signs and the nursing notes.  Pertinent labs & imaging results that were available during my care of the  patient were reviewed by me and considered in my medical decision making (see chart for details).    Pt's ammonia level is 209.  He was treated with lactulose.  He is a FP resident patient, but they no longer take [facility] patients over at [facility].  I spoke with Dr. Hal Hope (triad) who will admit.  Final Clinical Impressions(s) / ED Diagnoses   Final diagnoses:  Acute  metabolic encephalopathy    ED Discharge Orders    None       Isla Pence, MD 03/20/18 2108

## 2018-03-20 NOTE — ED Notes (Signed)
Attempt report x1 but floor is overwhelmed and needs 20 more minutes before report can be called.

## 2018-03-20 NOTE — ED Notes (Signed)
Attempt report x1  

## 2018-03-20 NOTE — ED Triage Notes (Signed)
Pt sent by liver doctor for pt being confused, drowsy and drooling over past couple days. Pt does have elevated amnomia levels and instructed to take lactulose at home for that.

## 2018-03-20 NOTE — H&P (Signed)
History and Physical    Hunter Carter ZTI:[id number] DOB: Jan 02, 1951 DOA: 03/20/2018  PCP: Nuala Alpha, DO  Patient coming from: Home.  Chief Complaint: Altered mental status.  Abdominal pain.  HPI: Hunter Carter is a 67 y.o. male with history of cirrhosis of the liver secondary to hepatitis C and previous alcoholism, CAD status post CABG, hypertension, paroxysmal atrial fibrillation, chronic thrombocytopenia was brought to the ER after patient was found to be having increasing confusion over the last 3 days with some drooling of saliva.  Most of the history was obtained from ER physician as I am unable to reach the patient's family.  Patient states he also had some nausea vomiting 2 days ago.  He has been having some epigastric discomfort for last few days.  Has not thrown up today.  Patient was taken to his gastroenterologist and was advised to come to the ER.  ED Course: In the ER patient is found to be confused but oriented to his name and place.  Follows commands moves all extremities.  Ammonia level is 209.  CT of the head was unremarkable.  Patient was given lactulose and admitted for further observation.  Review of Systems: As per HPI, rest all negative.   Past Medical History:  Diagnosis Date  . Acute hepatic encephalopathy 10/10/2015   [name] 10/10/2015  . Arthritis    "joints ache" (10/10/2015)  . Bleeding stomach ulcer    "recently" (10/10/2015)  . CAD (coronary artery disease)   . CHF (congestive heart failure) ([place])   . Cirrhosis ([place])    due to HCV/notes 10/10/2015  . ETOH abuse   . GERD (gastroesophageal reflux disease)   . Heart block AV second degree    permanent pacemaker 02/2004  . Hepatitis C   . Hyperlipidemia   . Inferior MI ([place]) 2005  . Presence of permanent cardiac pacemaker   . RBBB   . Systemic hypertension   . Tubulovillous adenoma of colon   . Ventricular tachycardia (HCC)    nonsustained    Past Surgical History:  Procedure  Laterality Date  . CARDIAC CATHETERIZATION  03/06/04   significant 3 vessel disease, totalled graft obtuse marginal  . CORONARY ARTERY BYPASS GRAFT  02/10/03   LIMA to LAD,SVG to first diagonal, SVT to OM1  . ESOPHAGOGASTRODUODENOSCOPY N/A 07/29/2015   Procedure: ESOPHAGOGASTRODUODENOSCOPY (EGD);  Surgeon: Clarene Essex, MD;  Location: [facility] ENDOSCOPY;  Service: Endoscopy;  Laterality: N/A;  . INSERT / REPLACE / REMOVE PACEMAKER  03/2010   Medtronic  . KNEE ARTHROSCOPY Left [phone]  . MEDIASTINAL EXPLORATION  02/10/2003  . NM MYOCAR PERF WALL MOTION  04/05/09   low risk scan-abnormal-mod. perfusion defect basal inferoseptal,basal inferior,mid inferoseptal,mid inferior and apical inferior regions  . TOTAL KNEE ARTHROPLASTY Left   . US ECHOCARDIOGRAPHY  01/20/07   mild MR,mild to mod. TR,trace AI,mild PI     reports that he quit smoking about 4 months ago. His smoking use included cigarettes. He has a 10.25 pack-year smoking history. He has never used smokeless tobacco. He reports that he drinks alcohol. He reports that he has current or past drug history. Drug: Marijuana.  No Known Allergies  Family History  Problem Relation Age of Onset  . Heart failure Mother   . Liver disease Father   . Heart failure Sister   . Hypertension Sister   . Hypertension Sister   . Colon cancer Neg Hx     Prior to Admission medications   Medication Sig Start Date  End Date Taking? Authorizing Provider  albuterol (PROVENTIL HFA;VENTOLIN HFA) 108 (90 Base) MCG/ACT inhaler Inhale 2 puffs into the lungs every 4 (four) hours as needed for wheezing or shortness of breath (or coughing). 03/11/18  Yes Nuala Alpha, DO  aspirin 81 MG chewable tablet Chew 81 mg by mouth daily.   Yes [provider]  furosemide (LASIX) 40 MG tablet Take 1 tablet (40 mg total) by mouth as needed. Please make appointment. 03/11/18  Yes Lockamy, Timothy, DO  lactulose (CHRONULAC) 10 GM/15ML solution Take 45 mLs (30 g total) by  mouth 2 (two) times daily as needed for mild constipation. Titrate for 3-4 BM's/ day 02/12/18  Yes Lockamy, Timothy, DO  losartan (COZAAR) 25 MG tablet Take 1 tablet (25 mg total) by mouth daily. KEEP OV. 03/11/18  Yes Lockamy, Timothy, DO  metoprolol tartrate (LOPRESSOR) 50 MG tablet Take 1 tablet (50 mg total) by mouth 2 (two) times daily. 03/11/18  Yes Lockamy, Timothy, DO  omeprazole (PRILOSEC) 20 MG capsule Take 1 capsule (20 mg total) by mouth daily. 03/11/18  Yes Lockamy, Timothy, DO  simvastatin (ZOCOR) 20 MG tablet Take 1 tablet (20 mg total) by mouth daily. Please make appointment. 03/11/18  Yes Lockamy, Timothy, DO  ondansetron (ZOFRAN ODT) 4 MG disintegrating tablet Take 1 tablet (4 mg total) by mouth every 8 (eight) hours as needed for nausea or vomiting. Patient not taking: Reported on 03/20/2018 02/15/17   Blanchie Dessert, MD    Physical Exam: Vitals:   03/20/18 1620 03/20/18 1831 03/20/18 2015 03/20/18 2145  BP: (!) 152/84 (!) 139/98 134/90 126/81  Pulse: 62 73 62 (!) 39  Resp: 20 18 (!) 24 (!) 22  Temp: 98.1 F (36.7 C) 98.3 F (36.8 C)    TempSrc: Oral Oral    SpO2: 98% 97% 99% 97%  Weight:      Height:          Constitutional: Moderately built and nourished. Vitals:   03/20/18 1620 03/20/18 1831 03/20/18 2015 03/20/18 2145  BP: (!) 152/84 (!) 139/98 134/90 126/81  Pulse: 62 73 62 (!) 39  Resp: 20 18 (!) 24 (!) 22  Temp: 98.1 F (36.7 C) 98.3 F (36.8 C)    TempSrc: Oral Oral    SpO2: 98% 97% 99% 97%  Weight:      Height:       Eyes: Anicteric no pallor. ENMT: No discharge from the ears eyes nose or mouth. Neck: No mass felt.  No neck rigidity. Respiratory: No rhonchi or crepitations. Cardiovascular: S1-S2 heard no murmurs appreciated. Abdomen: Soft mildly distended nontender bowel sounds present. Musculoskeletal: No edema. Skin: No rash. Neurologic: Alert awake oriented to his name and place is bit slow in answering questions.  Has some confusion on the  exact time.  Moves all extremities. Psychiatric: Appears normal.   Labs on Admission: I have personally reviewed following labs and imaging studies  CBC: Recent Labs  Lab 03/20/18 1620  WBC 3.5*  HGB 16.2  HCT 47.1  MCV 95.7  PLT 48*   Basic Metabolic Panel: Recent Labs  Lab 03/20/18 1620  NA 141  K 3.7  CL 107  CO2 28  GLUCOSE 124*  BUN 10  CREATININE 0.93  CALCIUM 8.3*   GFR: Estimated Creatinine Clearance: 88.1 mL/min (by C-G formula based on SCr of 0.93 mg/dL). Liver Function Tests: Recent Labs  Lab 03/20/18 1620  AST 58*  ALT 34  ALKPHOS 118  BILITOT 2.4*  PROT 7.2  ALBUMIN  2.5*   No results for input(s): LIPASE, AMYLASE in the last 168 hours. Recent Labs  Lab 03/20/18 1918  AMMONIA 209*   Coagulation Profile: Recent Labs  Lab 03/20/18 1918  INR 1.63   Cardiac Enzymes: No results for input(s): CKTOTAL, CKMB, CKMBINDEX, TROPONINI in the last 168 hours. BNP (last 3 results) No results for input(s): PROBNP in the last 8760 hours. HbA1C: No results for input(s): HGBA1C in the last 72 hours. CBG: Recent Labs  Lab 03/20/18 1412  GLUCAP 84   Lipid Profile: No results for input(s): CHOL, HDL, LDLCALC, TRIG, CHOLHDL, LDLDIRECT in the last 72 hours. Thyroid Function Tests: No results for input(s): TSH, T4TOTAL, FREET4, T3FREE, THYROIDAB in the last 72 hours. Anemia Panel: No results for input(s): VITAMINB12, FOLATE, FERRITIN, TIBC, IRON, RETICCTPCT in the last 72 hours. Urine analysis:    Component Value Date/Time   COLORURINE AMBER (A) 09/07/2017 1727   APPEARANCEUR CLOUDY (A) 09/07/2017 1727   LABSPEC 1.019 09/07/2017 1727   PHURINE 7.0 09/07/2017 1727   GLUCOSEU NEGATIVE 09/07/2017 1727   HGBUR SMALL (A) 09/07/2017 1727   BILIRUBINUR SMALL (A) 09/07/2017 1727   KETONESUR NEGATIVE 09/07/2017 1727   PROTEINUR NEGATIVE 09/07/2017 1727   NITRITE NEGATIVE 09/07/2017 1727   LEUKOCYTESUR TRACE (A) 09/07/2017 1727   Sepsis  Labs: @LABRCNTIP (procalcitonin:4,lacticidven:4) )No results found for this or any previous visit (from the past 240 hour(s)).   Radiological Exams on Admission: Dg Chest 2 View  Result Date: 03/20/2018 CLINICAL DATA:  67 year old male with history of liver disease. Confusion. Shortness of breath. EXAM: CHEST - 2 VIEW COMPARISON:  09/07/2017 and earlier. FINDINGS: Semi upright AP and lateral views of the chest. Stable cardiomegaly and mediastinal contours. Lung volumes are within normal limits. Prior CABG. Stable left chest cardiac pacemaker. Visualized tracheal air column is within normal limits. No pneumothorax, pulmonary edema, pleural effusion or confluent pulmonary opacity. Negative visible bowel gas pattern. No acute osseous abnormality identified. IMPRESSION: No acute cardiopulmonary abnormality. Electronically Signed   By: Genevie Ann M.D.   On: 03/20/2018 19:20   Ct Head Wo Contrast  Result Date: 03/20/2018 CLINICAL DATA:  67 year old male with history of liver disease. Confusion. EXAM: CT HEAD WITHOUT CONTRAST TECHNIQUE: Contiguous axial images were obtained from the base of the skull through the vertex without intravenous contrast. COMPARISON:  Head CT without contrast 09/07/2017. FINDINGS: Brain: Stable cerebral volume. No midline shift, ventriculomegaly, mass effect, evidence of mass lesion, intracranial hemorrhage or evidence of cortically based acute infarction. Patchy and confluent bilateral cerebral white matter hypodensity appears stable. Vascular: Calcified atherosclerosis at the skull base. No suspicious intracranial vascular hyperdensity. Skull: No acute osseous abnormality identified. Sinuses/Orbits: Visualized paranasal sinuses and mastoids are stable and well pneumatized. Other: No acute orbit or scalp soft tissue findings. IMPRESSION: 1.  No acute intracranial abnormality. 2. Stable non contrast CT appearance of the brain with moderate nonspecific cerebral white matter changes.  Electronically Signed   By: Genevie Ann M.D.   On: 03/20/2018 19:19    EKG: Independently reviewed.  Paced rhythm.  Assessment/Plan Principal Problem:   Acute hepatic encephalopathy Active Problems:   CAD s/p CABG 2004   Pacemaker - dual chamber Medtronic 2011   Hepatitis C   PAT (paroxysmal atrial tachycardia) (HCC)   Chronic diastolic congestive heart failure ([place])    1. Acute hepatic encephalopathy -patient symptoms are likely from hepatic encephalopathy given the elevated ammonia.  I have placed patient on lactulose 30 g p.o. 3 times daily.  Since  patient also is complaining of abdominal pain I have placed empirically on ceftriaxone for SBP coverage and will check lipase CT abdomen.  CT abdomen does show any ascites will get paracentesis.  We will also check for any other causes for the abdominal discomfort.  Given history of CAD will check second cardiac markers. 2. Hypertension on Cozaar and metoprolol. 3. History of diastolic CHF appears to be compensated.  Patient takes Lasix only as needed. 4. History of paroxysmal atrial fibrillation -not on any anticoagulation given history of chronic thrombocytopenia and coagulopathy and history of bleeding from liver disease. 5. Chronic leukopenia and thrombocytopenia likely from cirrhosis.  Follow CBC. 6. CAD status post CABG -on statins.  Given that patient has epigastric discomfort will check troponin. 7. COPD not actively wheezing.  May need to get further history from patient's daughter once available.   DVT prophylaxis: SCDs since patient has thrombocytopenia. Code Status: Full code. Family Communication: Unable to reach patient's daughter. Disposition Plan: Home. Consults called: None. Admission status: Observation.   Rise Patience MD [facility] Pager [phone].  If 7PM-7AM, please contact night-coverage [email or web address] Password [email or web address]  03/20/2018, 10:19 PM

## 2018-03-21 ENCOUNTER — Other Ambulatory Visit: Payer: Self-pay

## 2018-03-21 DIAGNOSIS — Z950 Presence of cardiac pacemaker: Secondary | ICD-10-CM

## 2018-03-21 DIAGNOSIS — I25118 Atherosclerotic heart disease of native coronary artery with other forms of angina pectoris: Secondary | ICD-10-CM

## 2018-03-21 DIAGNOSIS — B182 Chronic viral hepatitis C: Secondary | ICD-10-CM

## 2018-03-21 DIAGNOSIS — I5032 Chronic diastolic (congestive) heart failure: Secondary | ICD-10-CM | POA: Diagnosis not present

## 2018-03-21 DIAGNOSIS — K7200 Acute and subacute hepatic failure without coma: Secondary | ICD-10-CM | POA: Diagnosis not present

## 2018-03-21 DIAGNOSIS — I471 Supraventricular tachycardia: Secondary | ICD-10-CM | POA: Diagnosis not present

## 2018-03-21 LAB — CBC WITH DIFFERENTIAL/PLATELET
Basophils Absolute: 0.1 10*3/uL (ref 0.0–0.1)
Basophils Relative: 1 %
Eosinophils Absolute: 0.2 10*3/uL (ref 0.0–0.7)
Eosinophils Relative: 3 %
HCT: 44.3 % (ref 39.0–52.0)
Hemoglobin: 15.3 g/dL (ref 13.0–17.0)
LYMPHS ABS: 3.1 10*3/uL (ref 0.7–4.0)
Lymphocytes Relative: 50 %
MCH: 33.2 pg (ref 26.0–34.0)
MCHC: 34.5 g/dL (ref 30.0–36.0)
MCV: 96.1 fL (ref 78.0–100.0)
MONO ABS: 1.3 10*3/uL — AB (ref 0.1–1.0)
Monocytes Relative: 20 %
Neutro Abs: 1.6 10*3/uL — ABNORMAL LOW (ref 1.7–7.7)
Neutrophils Relative %: 26 %
PLATELETS: 45 10*3/uL — AB (ref 150–400)
RBC: 4.61 MIL/uL (ref 4.22–5.81)
RDW: 14.2 % (ref 11.5–15.5)
WBC: 6.3 10*3/uL (ref 4.0–10.5)

## 2018-03-21 LAB — GLUCOSE, CAPILLARY
GLUCOSE-CAPILLARY: 88 mg/dL (ref 65–99)
GLUCOSE-CAPILLARY: 269 mg/dL — AB (ref 65–99)
Glucose-Capillary: 83 mg/dL (ref 65–99)

## 2018-03-21 LAB — COMPREHENSIVE METABOLIC PANEL
ALK PHOS: 112 U/L (ref 38–126)
ALT: 32 U/L (ref 17–63)
AST: 54 U/L — ABNORMAL HIGH (ref 15–41)
Albumin: 2.4 g/dL — ABNORMAL LOW (ref 3.5–5.0)
Anion gap: 5 (ref 5–15)
BUN: 9 mg/dL (ref 6–20)
CALCIUM: 8.4 mg/dL — AB (ref 8.9–10.3)
CO2: 27 mmol/L (ref 22–32)
CREATININE: 0.77 mg/dL (ref 0.61–1.24)
Chloride: 110 mmol/L (ref 101–111)
GFR calc Af Amer: >60 mL/min (ref >60)
GFR calc non Af Amer: >60 mL/min (ref >60)
GLUCOSE: 102 mg/dL — AB (ref 65–99)
Potassium: 4.1 mmol/L (ref 3.5–5.1)
SODIUM: 142 mmol/L (ref 135–145)
Total Bilirubin: 1.9 mg/dL — ABNORMAL HIGH (ref 0.3–1.2)
Total Protein: 6.8 g/dL (ref 6.5–8.1)

## 2018-03-21 LAB — TROPONIN I
Troponin I: 0.04 ng/mL (ref <0.03)
Troponin I: 0.04 ng/mL (ref <0.03)
Troponin I: 0.03 ng/mL (ref <0.03)
Troponin I: 0.03 ng/mL (ref <0.03)

## 2018-03-21 LAB — LIPASE, BLOOD: LIPASE: 40 U/L (ref 11–51)

## 2018-03-21 LAB — BASIC METABOLIC PANEL
Anion gap: 7 (ref 5–15)
BUN: 10 mg/dL (ref 6–20)
CHLORIDE: 108 mmol/L (ref 101–111)
CO2: 26 mmol/L (ref 22–32)
CREATININE: 0.83 mg/dL (ref 0.61–1.24)
Calcium: 8.4 mg/dL — ABNORMAL LOW (ref 8.9–10.3)
GFR calc Af Amer: >60 mL/min (ref >60)
GFR calc non Af Amer: >60 mL/min (ref >60)
Glucose, Bld: 60 mg/dL — ABNORMAL LOW (ref 65–99)
POTASSIUM: 3 mmol/L — AB (ref 3.5–5.1)
Sodium: 141 mmol/L (ref 135–145)

## 2018-03-21 LAB — HEPATIC FUNCTION PANEL
ALBUMIN: 2.3 g/dL — AB (ref 3.5–5.0)
ALK PHOS: 110 U/L (ref 38–126)
ALT: 32 U/L (ref 17–63)
AST: 52 U/L — ABNORMAL HIGH (ref 15–41)
BILIRUBIN TOTAL: 2 mg/dL — AB (ref 0.3–1.2)
Bilirubin, Direct: 0.5 mg/dL (ref 0.1–0.5)
Indirect Bilirubin: 1.5 mg/dL — ABNORMAL HIGH (ref 0.3–0.9)
Total Protein: 6.8 g/dL (ref 6.5–8.1)

## 2018-03-21 LAB — AMMONIA: Ammonia: 188 umol/L — ABNORMAL HIGH (ref 9–35)

## 2018-03-21 MED ORDER — INSULIN ASPART 100 UNIT/ML ~~LOC~~ SOLN: 0.0000 [IU] | Freq: Three times a day (TID) | SUBCUTANEOUS | Status: DC

## 2018-03-21 MED ORDER — POTASSIUM CHLORIDE 10 MEQ/100ML IV SOLN
10.0000 meq | INTRAVENOUS | Status: AC
  Administered 2018-03-21 (×2): 10 meq via INTRAVENOUS
  Filled 2018-03-21 (×2): qty 100

## 2018-03-21 MED ORDER — POTASSIUM CHLORIDE 20 MEQ/15ML (10%) PO SOLN
40.0000 meq | Freq: Once | ORAL | Status: AC
  Administered 2018-03-21: 40 meq via ORAL
  Filled 2018-03-21: qty 30

## 2018-03-21 NOTE — Plan of Care (Signed)

## 2018-03-21 NOTE — ED Notes (Signed)
ED TO INPATIENT HANDOFF REPORT  Name/Age/Gender Hunter Carter 67 y.o. male  Code Status    Code Status Orders  (From admission, onward)        Start     Ordered   03/20/18 2218  Full code  Continuous     03/20/18 2219    Code Status History    Date Active Date Inactive Code Status Order ID Comments User Context   09/07/2017 2301 09/09/2017 1830 Full Code [id number]  Rise Patience, MD ED   10/10/2015 1940 10/13/2015 1642 Full Code [id number]  Ivor Costa, MD ED   07/29/2015 0345 08/04/2015 1851 Full Code [id number]  Corey Harold, NP ED      Home/SNF/Other Home  Chief Complaint Facial Droop; Shakiness; Sent from Dr  Level of Care/Admitting Diagnosis ED Disposition    ED Disposition Condition [place]: [place] [[id number]]  Level of Care: Telemetry [5]  Admit to tele based on following criteria: Monitor for Ischemic changes  Diagnosis: Acute hepatic encephalopathy [030092]  Admitting Physician: Rise Patience [id number]  Attending Physician: Rise Patience [phone]  PT Class (Do Not Modify): Observation [104]  PT Acc Code (Do Not Modify): Observation [10022]       Medical History Past Medical History:  Diagnosis Date  . Acute hepatic encephalopathy 10/10/2015   [name] 10/10/2015  . Arthritis    "joints ache" (10/10/2015)  . Bleeding stomach ulcer    "recently" (10/10/2015)  . CAD (coronary artery disease)   . CHF (congestive heart failure) ([place])   . Cirrhosis ([place])    due to HCV/notes 10/10/2015  . ETOH abuse   . GERD (gastroesophageal reflux disease)   . Heart block AV second degree    permanent pacemaker 02/2004  . Hepatitis C   . Hyperlipidemia   . Inferior MI ([place]) 2005  . Presence of permanent cardiac pacemaker   . RBBB   . Systemic hypertension   . Tubulovillous adenoma of colon   . Ventricular tachycardia (HCC)    nonsustained    Allergies No Known Allergies  IV  Location/Drains/Wounds Patient Lines/Drains/Airways Status   Active Line/Drains/Airways    Name:   Placement date:   Placement time:   Site:   Days:   Peripheral IV 03/20/18 Left Antecubital   03/20/18    1909    Antecubital   1          Labs/Imaging Results for orders placed or performed during the hospital encounter of 03/20/18 (from the past 48 hour(s))  CBG monitoring, ED     Status: None   Collection Time: 03/20/18  2:12 PM  Result Value Ref Range   Glucose-Capillary 84 65 - 99 mg/dL  Comprehensive metabolic panel     Status: Abnormal   Collection Time: 03/20/18  4:20 PM  Result Value Ref Range   Sodium 141 135 - 145 mmol/L   Potassium 3.7 3.5 - 5.1 mmol/L   Chloride 107 101 - 111 mmol/L   CO2 28 22 - 32 mmol/L   Glucose, Bld 124 (H) 65 - 99 mg/dL   BUN 10 6 - 20 mg/dL   Creatinine, Ser 0.93 0.61 - 1.24 mg/dL   Calcium 8.3 (L) 8.9 - 10.3 mg/dL   Total Protein 7.2 6.5 - 8.1 g/dL   Albumin 2.5 (L) 3.5 - 5.0 g/dL   AST 58 (H) 15 - 41 U/L   ALT 34 17 - 63 U/L   Alkaline Phosphatase  118 38 - 126 U/L   Total Bilirubin 2.4 (H) 0.3 - 1.2 mg/dL   GFR calc non Af Amer >60 >60 mL/min   GFR calc Af Amer >60 >60 mL/min    Comment: (NOTE) The eGFR has been calculated using the CKD EPI equation. This calculation has not been validated in all clinical situations. eGFR's persistently <60 mL/min signify possible Chronic Kidney Disease.    Anion gap 6 5 - 15    Comment: Performed at [facility], [place] [address], [place], [place] [address]  CBC     Status: Abnormal   Collection Time: 03/20/18  4:20 PM  Result Value Ref Range   WBC 3.5 (L) 4.0 - 10.5 K/uL   RBC 4.92 4.22 - 5.81 MIL/uL   Hemoglobin 16.2 13.0 - 17.0 g/dL   HCT 47.1 39.0 - 52.0 %   MCV 95.7 78.0 - 100.0 fL   MCH 32.9 26.0 - 34.0 pg   MCHC 34.4 30.0 - 36.0 g/dL   RDW 14.3 11.5 - 15.5 %   Platelets 48 (L) 150 - 400 K/uL    Comment: REPEATED TO VERIFY SPECIMEN CHECKED FOR CLOTS PLATELET COUNT  CONFIRMED BY SMEAR Performed at [facility] [address], [place], [place] [address]   Ammonia     Status: Abnormal   Collection Time: 03/20/18  7:18 PM  Result Value Ref Range   Ammonia 209 (H) 9 - 35 umol/L    Comment: Performed at [facility], [place] [address], [place], [place] [address]  Protime-INR     Status: Abnormal   Collection Time: 03/20/18  7:18 PM  Result Value Ref Range   Prothrombin Time 19.1 (H) 11.4 - 15.2 seconds   INR 1.63     Comment: Performed at [facility] [place] [address], [place], [place] [address]   Ct Abdomen Pelvis Wo Contrast  Result Date: 03/20/2018 CLINICAL DATA:  Intermittent mid abdominal pain for the past month. Nausea and vomiting yesterday. EXAM: CT ABDOMEN AND PELVIS WITHOUT CONTRAST TECHNIQUE: Multidetector CT imaging of the abdomen and pelvis was performed following the standard protocol without IV contrast. COMPARISON:  CT abdomen pelvis dated September 08, 2017. FINDINGS: Lower chest: No acute abnormality. Hepatobiliary: Nodular liver contour, consistent with cirrhosis. No focal liver abnormality. Unchanged cholelithiasis. No gallbladder wall thickening or biliary dilatation. Pancreas: Unremarkable. No pancreatic ductal dilatation or surrounding inflammatory changes. Spleen: Normal in size without focal abnormality. Adrenals/Urinary Tract: Adrenal glands are unremarkable. Kidneys are normal, without renal calculi, focal lesion, or hydronephrosis. Bladder is unremarkable. Stomach/Bowel: Stomach is within normal limits. Appendix appears normal. No evidence of bowel wall thickening, distention, or inflammatory changes. Sigmoid diverticulosis. Vascular/Lymphatic: Large paraesophageal varices and recannulated umbilical vein again noted. Aortic atherosclerosis. No enlarged abdominal or pelvic lymph nodes. Reproductive: Prostatic calcifications, unchanged. Other: No free fluid or pneumoperitoneum. Unchanged  small fat containing umbilical and left inguinal hernias. Musculoskeletal: No acute or significant osseous findings. Degenerative changes of the lumbar spine. IMPRESSION: 1.  No acute intra-abdominal process. 2. Cirrhosis with sequelae of portal hypertension. 3. Cholelithiasis. 4.  Aortic atherosclerosis (ICD10-I70.0). Electronically Signed   By: Titus Dubin M.D.   On: 03/20/2018 22:46   Dg Chest 2 View  Result Date: 03/20/2018 CLINICAL DATA:  67 year old male with history of liver disease. Confusion. Shortness of breath. EXAM: CHEST - 2 VIEW COMPARISON:  09/07/2017 and earlier. FINDINGS: Semi upright AP and lateral views of the chest. Stable cardiomegaly and mediastinal contours. Lung  volumes are within normal limits. Prior CABG. Stable left chest cardiac pacemaker. Visualized tracheal air column is within normal limits. No pneumothorax, pulmonary edema, pleural effusion or confluent pulmonary opacity. Negative visible bowel gas pattern. No acute osseous abnormality identified. IMPRESSION: No acute cardiopulmonary abnormality. Electronically Signed   By: Genevie Ann M.D.   On: 03/20/2018 19:20   Ct Head Wo Contrast  Result Date: 03/20/2018 CLINICAL DATA:  67 year old male with history of liver disease. Confusion. EXAM: CT HEAD WITHOUT CONTRAST TECHNIQUE: Contiguous axial images were obtained from the base of the skull through the vertex without intravenous contrast. COMPARISON:  Head CT without contrast 09/07/2017. FINDINGS: Brain: Stable cerebral volume. No midline shift, ventriculomegaly, mass effect, evidence of mass lesion, intracranial hemorrhage or evidence of cortically based acute infarction. Patchy and confluent bilateral cerebral white matter hypodensity appears stable. Vascular: Calcified atherosclerosis at the skull base. No suspicious intracranial vascular hyperdensity. Skull: No acute osseous abnormality identified. Sinuses/Orbits: Visualized paranasal sinuses and mastoids are stable and well  pneumatized. Other: No acute orbit or scalp soft tissue findings. IMPRESSION: 1.  No acute intracranial abnormality. 2. Stable non contrast CT appearance of the brain with moderate nonspecific cerebral white matter changes. Electronically Signed   By: Genevie Ann M.D.   On: 03/20/2018 19:19    Pending Labs Unresulted Labs (From admission, onward)   Start     Ordered   03/21/18 4098  Basic metabolic panel  Tomorrow morning,   R     03/20/18 2219   03/21/18 0500  Hepatic function panel  Tomorrow morning,   R     03/20/18 2219   03/21/18 0500  CBC WITH DIFFERENTIAL  Tomorrow morning,   R     03/20/18 2219   03/21/18 0500  Troponin I  Tomorrow morning,   R     03/20/18 2219   03/21/18 0500  Ammonia  Tomorrow morning,   R     03/20/18 2220      Vitals/Pain Today's Vitals   03/20/18 2015 03/20/18 2145 03/20/18 2300 03/20/18 2350  BP: 134/90 126/81 105/63 124/85  Pulse: 62 (!) 39 64 64  Resp: (!) 24 (!) 22 20 (!) 22  Temp:    98.3 F (36.8 C)  TempSrc:    Oral  SpO2: 99% 97% 98% 100%  Weight:      Height:      PainSc:        Isolation Precautions No active isolations  Medications Medications  lactulose (CHRONULAC) 10 GM/15ML solution 30 g (has no administration in time range)  losartan (COZAAR) tablet 25 mg (has no administration in time range)  metoprolol tartrate (LOPRESSOR) tablet 50 mg (has no administration in time range)  pantoprazole (PROTONIX) EC tablet 40 mg (has no administration in time range)  simvastatin (ZOCOR) tablet 20 mg (has no administration in time range)  acetaminophen (TYLENOL) tablet 650 mg (has no administration in time range)    Or  acetaminophen (TYLENOL) suppository 650 mg (has no administration in time range)  ondansetron (ZOFRAN) tablet 4 mg (has no administration in time range)    Or  ondansetron (ZOFRAN) injection 4 mg (has no administration in time range)  cefTRIAXone (ROCEPHIN) 2 g in sodium chloride 0.9 % 100 mL IVPB (has no administration in  time range)  albuterol (PROVENTIL) (2.5 MG/3ML) 0.083% nebulizer solution 2.5 mg (has no administration in time range)  lactulose (CHRONULAC) 10 GM/15ML solution 20 g (20 g Oral Given 03/20/18 2044)  iohexol (OMNIPAQUE) 300 MG/ML  solution 30 mL (30 mLs Oral Contrast Given 03/20/18 2114)    Mobility Walks unknown if pt needs assistance to ambulate or not

## 2018-03-21 NOTE — Care Management Obs Status (Signed)
[place] NOTIFICATION   Patient Details  Name: Benaiah Behan MRN: [id number] Date of Birth: 1951-05-17   Medicare Observation Status Notification Given:  Yes    Purcell Mouton, RN 03/21/2018, 12:40 PM

## 2018-03-21 NOTE — Plan of Care (Signed)

## 2018-03-21 NOTE — Progress Notes (Signed)
Paged doctor about critical Troponin level.

## 2018-03-21 NOTE — Progress Notes (Signed)
PROGRESS NOTE    Hunter Carter  JHE:[id number] DOB: [birth year] DOA: 03/20/2018 PCP: Nuala Alpha, DO   Brief Narrative:  Hunter Carter is a 67 y.o. male with history of cirrhosis of the liver secondary to hepatitis C and previous alcoholism, CAD status post CABG, hypertension, paroxysmal atrial fibrillation, chronic thrombocytopenia and other comorbidites who was brought to the ER after patient was found to be having increasing confusion over the last 3 days with some drooling of saliva.  Patient stated he also had some nausea vomiting 2 days ago.  He has been having some epigastric discomfort for last few days. Patient was taken to his Gastroenterologist and was advised to come to the ER. In the ER patient is found to be confused but oriented to his name and place.  Follows commands moves all extremities.  Ammonia level is 209. CT of the head was unremarkable. Patient was given lactulose and admitted for further observation and is slowly improving.   Assessment & Plan:   Principal Problem:   Acute hepatic encephalopathy Active Problems:   CAD s/p CABG 2004   Pacemaker - dual chamber [employer] 2011   Hepatitis C   PAT (paroxysmal atrial tachycardia) (HCC)   Chronic diastolic congestive heart failure (HCC)  Acute Hepatic Encephalopathy the setting of Hyperammonemia  -Patient symptoms are likely from hepatic encephalopathy given the elevated ammonia.  -Head CT done and showed No acute intracranial abnormality. Stable non contrast CT appearance of the brain with moderate nonspecific cerebral white matter changes. -He was placed patient on lactulose 30 g p.o. 3 times daily and will continue.   -Patient was also complaining of abdominal pain so was placed empirically on Ceftriaxone for SBP coverage.  -Lipase was 40 -CT Abdomen and Pelvis done and showed No acute intra-abdominal process.  Cirrhosis with sequelae of portal hypertension. Cholelithiasis.  Aortic atherosclerosis. -Continue  with Lactulose above and repeat Ammonia Level in AM   Hypertension  -Continue with Metoprolol Tartrate 50 mg p.o. twice daily as well as Losartan 25 mg p.o. daily  History of Diastolic CHF  -Appears to be compensated.   -Patient takes Lasix only as needed. -Continue with Metoprolol Tartrate 50 mg p.o. twice daily as well as Losartan 25 mg p.o. daily  History of Paroxysmal Atrial Fibrillation  -Not on any anticoagulation given history of chronic thrombocytopenia and coagulopathy and history of bleeding from liver disease. -C/w Metoprolol 50 mg po BID  Chronic Leukopenia and Thrombocytopenia -Likely from Cirrhosis.   -Patient's WBC is actually improved went from 3.5 x 6.3 -Platelet count remains relatively stable at 45. -Continue to monitor and repeat CBC in a.m.  Liver cirrhosis in the setting of alcoholism and hepatitis C -Ammonia level was 188 today. -AST is elevated at 52 -T bili was also elevated at 2.0 -PT was 19.1 and INR was 1.63 -CT scan showed Cirrhosis with sequelae of portal hypertension. Cholelithiasis.  -Follow-up with Gastroenterologist and Hepatologist in outpatient setting.  CAD status post CABG  -Given that patient has epigastric discomfort will check troponin. -Troponin went from 0.04 -> 0.04 -> 0.03 -C/w Metoprolol Tartrate 50 mg p.o. twice daily as well as Losartan 25 mg p.o. Daily -C/w Simvastatin 20 mg po Daily   COPD  -Not actively wheezing. -C/w Albuterol 2.5 mg Neb q4hprn Wheezing and SOB  Hypokalemia -Patient's potassium level this morning was 3.0 -Replete with p.o. potassium chloride 40 mEq as well as IV potassium chloride 40 mEq -Continue to monitor and replete as necessary--repeat CMP in a.m.  Hyperbilirubinemia -Patient's telemetry bilirubin was 2.48 improved to 2.0.  His indirect bilirubin is 1.5 -Continue to monitor and repeat CMP in a.m.  GERD/Hx of Ulcer -Continue with Pantoprazole 40 mg p.o. daily  HLD -Continue Simvastatin 20 mg  p.o. daily  DVT prophylaxis: SCDs Code Status: FULL CODE Family Communication: Discussed with family at bedside  Disposition Plan: Obtain PT/OT Evaluation; Anticipate D/C Home in next 24-48 hours  Consultants:   None  Procedures:  None  Antimicrobials:  Anti-infectives (From admission, onward)   Start     Dose/Rate Route Frequency Ordered Stop   03/20/18 2245  cefTRIAXone (ROCEPHIN) 2 g in sodium chloride 0.9 % 100 mL IVPB     2 g 200 mL/hr over 30 Minutes Intravenous Daily at bedtime 03/20/18 2219       Subjective: Seen and examined anemia who was new he was in the hospital.  Also knew who the president was but was unable to tell me the year.  Denies any chest pain, shortness breath, nausea, vomiting.  Did complain of some epigastric pain still.  Had bowel movements yesterday but none today.  No other complaints or concerns at this time  Objective: Vitals:   03/20/18 2350 03/21/18 0020 03/21/18 0028 03/21/18 0417  BP: 124/85  134/79 (!) 144/90  Pulse: 64  (!) 56 74  Resp: (!) 22  20 20   Temp: 98.3 F (36.8 C)  98.1 F (36.7 C) 97.8 F (36.6 C)  TempSrc: Oral   Oral  SpO2: 100%  99% 100%  Weight:      Height:  5\' 6"  (1.676 m)      Intake/Output Summary (Last 24 hours) at 03/21/2018 0757 Last data filed at 03/21/2018 0302 Gross per 24 hour  Intake 220 ml  Output -  Net 220 ml   Filed Weights   03/20/18 1359  Weight: 98.4 kg (217 lb)   Examination: Physical Exam:  Constitutional: WN/WD obese AAM in NAD and appears calm  Eyes: Lids and conjunctivae normal, sclerae anicteric  ENMT: External Ears, Nose appear normal. Grossly normal hearing. Mucous membranes are moist. Neck: Appears normal, supple, no cervical masses, normal ROM, no appreciable thyromegaly; no JVD Respiratory: Diminished to auscultation bilaterally, no wheezing, rales, rhonchi or crackles. Normal respiratory effort and patient is not tachypenic. No accessory muscle use.  Cardiovascular: RRR, no  murmurs / rubs / gallops. S1 and S2 auscultated. Trace extremity edema Abdomen: Soft, slightly tender, Distended 2/2 body habitus.  Bowel sounds positive x4.  GU: Deferred. Musculoskeletal: No clubbing / cyanosis of digits/nails. No joint deformities noted Skin: No rashes, lesions, ulcers on a limited skin eval. No induration; Warm and dry.  Neurologic: CN 2-12 grossly intact with no focal deficits. Romberg sign and cerebellar reflexes not assessed.  Psychiatric: Alert and oriented x 2. Normal mood and appropriate affect.   Data Reviewed: I have personally reviewed following labs and imaging studies  CBC: Recent Labs  Lab 03/20/18 1620 03/21/18 0427  WBC 3.5* 6.3  NEUTROABS  --  1.6*  HGB 16.2 15.3  HCT 47.1 44.3  MCV 95.7 96.1  PLT 48* 45*   Basic Metabolic Panel: Recent Labs  Lab 03/20/18 1620 03/21/18 0427  NA 141 141  K 3.7 3.0*  CL 107 108  CO2 28 26  GLUCOSE 124* 60*  BUN 10 10  CREATININE 0.93 0.83  CALCIUM 8.3* 8.4*   GFR: Estimated Creatinine Clearance: 98.7 mL/min (by C-G formula based on SCr of 0.83 mg/dL). Liver Function Tests: Recent  Labs  Lab 03/20/18 1620 03/21/18 0427  AST 58* 52*  ALT 34 32  ALKPHOS 118 110  BILITOT 2.4* 2.0*  PROT 7.2 6.8  ALBUMIN 2.5* 2.3*   Recent Labs  Lab 03/21/18 0427  LIPASE 40   Recent Labs  Lab 03/20/18 1918 03/21/18 0427  AMMONIA 209* 188*   Coagulation Profile: Recent Labs  Lab 03/20/18 1918  INR 1.63   Cardiac Enzymes: Recent Labs  Lab 03/21/18 0427  TROPONINI 0.04*   BNP (last 3 results) No results for input(s): PROBNP in the last 8760 hours. HbA1C: No results for input(s): HGBA1C in the last 72 hours. CBG: Recent Labs  Lab 03/20/18 1412 03/21/18 0740  GLUCAP 84 88   Lipid Profile: No results for input(s): CHOL, HDL, LDLCALC, TRIG, CHOLHDL, LDLDIRECT in the last 72 hours. Thyroid Function Tests: No results for input(s): TSH, T4TOTAL, FREET4, T3FREE, THYROIDAB in the last 72  hours. Anemia Panel: No results for input(s): VITAMINB12, FOLATE, FERRITIN, TIBC, IRON, RETICCTPCT in the last 72 hours. Sepsis Labs: No results for input(s): PROCALCITON, LATICACIDVEN in the last 168 hours.  No results found for this or any previous visit (from the past 240 hour(s)).   Radiology Studies: Ct Abdomen Pelvis Wo Contrast  Result Date: 03/20/2018 CLINICAL DATA:  Intermittent mid abdominal pain for the past month. Nausea and vomiting yesterday. EXAM: CT ABDOMEN AND PELVIS WITHOUT CONTRAST TECHNIQUE: Multidetector CT imaging of the abdomen and pelvis was performed following the standard protocol without IV contrast. COMPARISON:  CT abdomen pelvis dated September 08, 2017. FINDINGS: Lower chest: No acute abnormality. Hepatobiliary: Nodular liver contour, consistent with cirrhosis. No focal liver abnormality. Unchanged cholelithiasis. No gallbladder wall thickening or biliary dilatation. Pancreas: Unremarkable. No pancreatic ductal dilatation or surrounding inflammatory changes. Spleen: Normal in size without focal abnormality. Adrenals/Urinary Tract: Adrenal glands are unremarkable. Kidneys are normal, without renal calculi, focal lesion, or hydronephrosis. Bladder is unremarkable. Stomach/Bowel: Stomach is within normal limits. Appendix appears normal. No evidence of bowel wall thickening, distention, or inflammatory changes. Sigmoid diverticulosis. Vascular/Lymphatic: Large paraesophageal varices and recannulated umbilical vein again noted. Aortic atherosclerosis. No enlarged abdominal or pelvic lymph nodes. Reproductive: Prostatic calcifications, unchanged. Other: No free fluid or pneumoperitoneum. Unchanged small fat containing umbilical and left inguinal hernias. Musculoskeletal: No acute or significant osseous findings. Degenerative changes of the lumbar spine. IMPRESSION: 1.  No acute intra-abdominal process. 2. Cirrhosis with sequelae of portal hypertension. 3. Cholelithiasis. 4.  Aortic  atherosclerosis (ICD10-I70.0). Electronically Signed   By: Titus Dubin M.D.   On: 03/20/2018 22:46   Dg Chest 2 View  Result Date: 03/20/2018 CLINICAL DATA:  67 year old male with history of liver disease. Confusion. Shortness of breath. EXAM: CHEST - 2 VIEW COMPARISON:  09/07/2017 and earlier. FINDINGS: Semi upright AP and lateral views of the chest. Stable cardiomegaly and mediastinal contours. Lung volumes are within normal limits. Prior CABG. Stable left chest cardiac pacemaker. Visualized tracheal air column is within normal limits. No pneumothorax, pulmonary edema, pleural effusion or confluent pulmonary opacity. Negative visible bowel gas pattern. No acute osseous abnormality identified. IMPRESSION: No acute cardiopulmonary abnormality. Electronically Signed   By: Genevie Ann M.D.   On: 03/20/2018 19:20   Ct Head Wo Contrast  Result Date: 03/20/2018 CLINICAL DATA:  67 year old male with history of liver disease. Confusion. EXAM: CT HEAD WITHOUT CONTRAST TECHNIQUE: Contiguous axial images were obtained from the base of the skull through the vertex without intravenous contrast. COMPARISON:  Head CT without contrast 09/07/2017. FINDINGS: Brain: Stable cerebral  volume. No midline shift, ventriculomegaly, mass effect, evidence of mass lesion, intracranial hemorrhage or evidence of cortically based acute infarction. Patchy and confluent bilateral cerebral white matter hypodensity appears stable. Vascular: Calcified atherosclerosis at the skull base. No suspicious intracranial vascular hyperdensity. Skull: No acute osseous abnormality identified. Sinuses/Orbits: Visualized paranasal sinuses and mastoids are stable and well pneumatized. Other: No acute orbit or scalp soft tissue findings. IMPRESSION: 1.  No acute intracranial abnormality. 2. Stable non contrast CT appearance of the brain with moderate nonspecific cerebral white matter changes. Electronically Signed   By: Genevie Ann M.D.   On: 03/20/2018 19:19    Scheduled Meds: . lactulose  30 g Oral TID  . losartan  25 mg Oral Daily  . metoprolol tartrate  50 mg Oral BID  . pantoprazole  40 mg Oral Daily  . simvastatin  20 mg Oral Daily   Continuous Infusions: . cefTRIAXone (ROCEPHIN)  IV Stopped (03/21/18 0306)    LOS: 0 days   Kerney Elbe, DO [facility] Pager [phone]  If 7PM-7AM, please contact night-coverage [email or web address] Password [facility] 03/21/2018, 7:57 AM

## 2018-03-21 NOTE — Progress Notes (Signed)
PT Note  Patient Details Name: Hunter Carter MRN: [id number] DOB: Jul 31, 1951         Chart reviewed and checked with pt. Pt was very difficult to arouse and keep aroused. Did sternal rubs, tapping and continue questions, however pt was very groggy and could only open eyes for 2-3 seconds intervals and 1 word answers. Oriented to name and not birthdate. Spoke with daughter who stated at baseline (before this episode a few days ago), pt was independent , lives alone, cooks takes care of himself. Pt and daughter agreed for me to check on patient tomorrow morning.   Clide Dales 03/21/2018, 5:52 PM  Clide Dales, PT Pager: [phone] 03/21/2018

## 2018-03-21 NOTE — Progress Notes (Signed)
Spoke with pt and daughter [name] [phone]) at bedside concerning discharge needs. Pt's daughter states that the pt will need someone to hep him with his meals and his medications. Pt states that he have Medicaid and Medicare. Referral given to [facility].

## 2018-03-21 NOTE — Care Management Obs Status (Signed)
[place] NOTIFICATION   Patient Details  Name: Hunter Carter MRN: [id number] Date of Birth: 31-Dec-1950   Medicare Observation Status Notification Given:  Yes    Purcell Mouton, RN 03/21/2018, 12:42 PM

## 2018-03-22 DIAGNOSIS — I5032 Chronic diastolic (congestive) heart failure: Secondary | ICD-10-CM | POA: Diagnosis not present

## 2018-03-22 DIAGNOSIS — K7200 Acute and subacute hepatic failure without coma: Secondary | ICD-10-CM | POA: Diagnosis not present

## 2018-03-22 DIAGNOSIS — I25118 Atherosclerotic heart disease of native coronary artery with other forms of angina pectoris: Secondary | ICD-10-CM | POA: Diagnosis not present

## 2018-03-22 DIAGNOSIS — B182 Chronic viral hepatitis C: Secondary | ICD-10-CM | POA: Diagnosis not present

## 2018-03-22 DIAGNOSIS — Z950 Presence of cardiac pacemaker: Secondary | ICD-10-CM | POA: Diagnosis not present

## 2018-03-22 LAB — PROTIME-INR
INR: 1.69
Prothrombin Time: 19.7 seconds — ABNORMAL HIGH (ref 11.4–15.2)

## 2018-03-22 LAB — CBC WITH DIFFERENTIAL/PLATELET
Basophils Absolute: 0 10*3/uL (ref 0.0–0.1)
Basophils Relative: 1 %
Eosinophils Absolute: 0.2 10*3/uL (ref 0.0–0.7)
Eosinophils Relative: 4 %
HEMATOCRIT: 42.6 % (ref 39.0–52.0)
Hemoglobin: 14.6 g/dL (ref 13.0–17.0)
Lymphocytes Relative: 37 %
Lymphs Abs: 1.9 10*3/uL (ref 0.7–4.0)
MCH: 33.2 pg (ref 26.0–34.0)
MCHC: 34.3 g/dL (ref 30.0–36.0)
MCV: 96.8 fL (ref 78.0–100.0)
MONO ABS: 0.8 10*3/uL (ref 0.1–1.0)
MONOS PCT: 17 %
NEUTROS ABS: 2.1 10*3/uL (ref 1.7–7.7)
Neutrophils Relative %: 41 %
Platelets: 55 10*3/uL — ABNORMAL LOW (ref 150–400)
RBC: 4.4 MIL/uL (ref 4.22–5.81)
RDW: 14.4 % (ref 11.5–15.5)
WBC: 5 10*3/uL (ref 4.0–10.5)

## 2018-03-22 LAB — COMPREHENSIVE METABOLIC PANEL
ALBUMIN: 2.3 g/dL — AB (ref 3.5–5.0)
ALK PHOS: 101 U/L (ref 38–126)
ALT: 32 U/L (ref 17–63)
ANION GAP: 5 (ref 5–15)
AST: 51 U/L — ABNORMAL HIGH (ref 15–41)
BILIRUBIN TOTAL: 2.3 mg/dL — AB (ref 0.3–1.2)
BUN: 8 mg/dL (ref 6–20)
CALCIUM: 8.4 mg/dL — AB (ref 8.9–10.3)
CO2: 27 mmol/L (ref 22–32)
Chloride: 108 mmol/L (ref 101–111)
Creatinine, Ser: 0.98 mg/dL (ref 0.61–1.24)
GFR calc Af Amer: >60 mL/min (ref >60)
GFR calc non Af Amer: >60 mL/min (ref >60)
GLUCOSE: 168 mg/dL — AB (ref 65–99)
Potassium: 4 mmol/L (ref 3.5–5.1)
Sodium: 140 mmol/L (ref 135–145)
TOTAL PROTEIN: 6.7 g/dL (ref 6.5–8.1)

## 2018-03-22 LAB — GLUCOSE, CAPILLARY
GLUCOSE-CAPILLARY: 125 mg/dL — AB (ref 65–99)
GLUCOSE-CAPILLARY: 82 mg/dL (ref 65–99)
Glucose-Capillary: 119 mg/dL — ABNORMAL HIGH (ref 65–99)
Glucose-Capillary: 75 mg/dL (ref 65–99)
Glucose-Capillary: 86 mg/dL (ref 65–99)

## 2018-03-22 LAB — MAGNESIUM: Magnesium: 1.7 mg/dL (ref 1.7–2.4)

## 2018-03-22 LAB — AMMONIA: AMMONIA: 85 umol/L — AB (ref 9–35)

## 2018-03-22 LAB — PHOSPHORUS: Phosphorus: 2.7 mg/dL (ref 2.5–4.6)

## 2018-03-22 MED ORDER — LACTULOSE 10 GM/15ML PO SOLN
30.0000 g | Freq: Two times a day (BID) | ORAL | 5 refills | Status: DC
Start: 2018-03-22 — End: 2018-10-21

## 2018-03-22 NOTE — Discharge Summary (Signed)
Physician Discharge Summary  Buck Mcaffee KKX:[id number] DOB: [birth year] DOA: 03/20/2018  PCP: Nuala Alpha, DO  Admit date: 03/20/2018 Discharge date: 03/22/2018  Admitted From: Home Disposition:  Home with Home Health PT/OT/RN/Aide  Recommendations for Outpatient Follow-up:  1. Follow up with PCP in 1-2 weeks 2. Follow up with Gastroenterology within 1 week  3. Please obtain CMP/CBC, Mag, Phos in one week 4. Please follow up on the following pending results:  Home Health: Yes Equipment/Devices: None recommended by PT    Discharge Condition: Stable  CODE STATUS: FULL CODE Diet recommendation: Heart Healthy Low Sodium Diet  Brief/Interim Summary: Hunter Thompsonis a 67 y.o.malewithhistory of cirrhosis of the liver secondary to hepatitis C and previous alcoholism, CAD status post CABG, hypertension, paroxysmal atrial fibrillation, chronic thrombocytopenia and other comorbidites who was brought to the ER after patient was found to be having increasing confusion over the last 3 days with some drooling of saliva. Patient stated he also had some nausea vomiting 2 days ago. He has been having some epigastric discomfort for last few days.Patient was taken to his Gastroenterologist and was advised to come to the ER. In the ER patient is found to be confused but oriented to his name and place. Follows commands moves all extremities. Ammonia level was 209. CT of the head was unremarkable. Patient was given lactulose and admitted for further observation and improved. His Ammonia level was 85 on discharge. PT/OT evaluated and recommended home Health PT. He was deemed medically stable to be D/C'd home and will need to follow up with PCP and Gastroenterology as an outpatient.   Discharge Diagnoses:  Principal Problem:   Acute hepatic encephalopathy Active Problems:   CAD s/p CABG 2004   Pacemaker - dual chamber Med[employer]onic 2011   Hepatitis C   PAT (paroxysmal atrial tachycardia) (HCC)    Chronic diastolic congestive heart failure (HCC)  Acute Hepatic Encephalopathy the setting of Hyperammonemia, improved -Patient symptoms are likely from hepatic encephalopathy given the elevated ammonia.  -Head CT done and showed No acute intracranial abnormality. Stable non contrast CT appearance of the brain with moderate nonspecific cerebral white matter changes. -He was placed patient on lactulose 30 g p.o. 3 times daily and will continue Lactulose 30 mg po BID scheduled  -Patient was also complaining of abdominal pain so was placed empirically on Ceftriaxone for SBP coverage but stopped as there was low suspicion.  -Lipase was 40 -CT Abdomen and Pelvis done and showed No acute intra-abdominal process.  Cirrhosis with sequelae of portal hypertension. Cholelithiasis. Aortic atherosclerosis. -Continue with Lactulose above and repeat Ammonia Level as an outpatient -PT/OT recommending Home Health PT  Hypertension  -Continue with Metoprolol Tartrate 50 mg p.o. twice daily as well as Losartan 25 mg p.o. daily  History of Diastolic CHF  -Appears to be compensated.  -Patient takes Lasix only as needed and resume home dose. -Continue with Metoprolol Tartrate 50 mg p.o. twice daily as well as Losartan 25 mg p.o. daily  History of Paroxysmal Atrial Fibrillation -Not on any anticoagulation given history of chronic thrombocytopenia and coagulopathy and history of bleeding from liver disease. -C/w Metoprolol 50 mg po BID  Chronic Leukopenia and Thrombocytopenia -Likely from Cirrhosis.  -Patient's WBC is 5.0 -Platelet count remains relatively stable at 55. -Continue to monitor and repeat CBC in a.m.  Liver cirrhosis in the setting of alcoholism and hepatitis C -Ammonia level was 85 today. -AST is elevated at 51 -T bili was also elevated at 2.0 -PT was 19.7 and  INR was 1.69 -CT scan showed Cirrhosis with sequelae of portal hypertension. Cholelithiasis.  -Follow-up with  Gastroenterologist and Hepatologist in outpatient setting.  CAD status post CABG -Given that patient has epigastric discomfort will check troponin. -Troponin went from 0.04 -> 0.04 -> 0.03 -C/w Metoprolol Tartrate 50 mg p.o. twice daily as well as Losartan 25 mg p.o. Daily -C/w Simvastatin 20 mg po Daily -Follow up with PCP and Cardiology as an outpatient    COPD  -Not actively wheezing. -C/w Albuterol 2.5 mg Neb q4hprn Wheezing and SOB  Hypokalemia -Patient's potassium level this morning was 4.0 -Continue to monitor and replete as necessary- -Repeat CMP in a.m.  Hyperbilirubinemia -Patient's Total bilirubin was 2.3 on Discharge -Continue to monitor and repeat CMP as an outpatient  GERD/Hx of Ulcer -Continue with Pantoprazole 40 mg p.o. daily  HLD -Continue Simvastatin 20 mg p.o. daily  Discharge Instructions  Discharge Instructions    Call MD for:  difficulty breathing, headache or visual disturbances   Complete by:  As directed    Call MD for:  extreme fatigue   Complete by:  As directed    Call MD for:  hives   Complete by:  As directed    Call MD for:  persistant dizziness or light-headedness   Complete by:  As directed    Call MD for:  persistant nausea and vomiting   Complete by:  As directed    Call MD for:  redness, tenderness, or signs of infection (pain, swelling, redness, odor or green/yellow discharge around incision site)   Complete by:  As directed    Call MD for:  severe uncontrolled pain   Complete by:  As directed    Call MD for:  temperature >100.4   Complete by:  As directed    Diet - low sodium heart healthy   Complete by:  As directed    Discharge instructions   Complete by:  As directed    Will up with PCP as well as Gastroenterologist within 1 week.  Take all medications as prescribed.  If symptoms change or worsen please return to emergency room for evaluation   Increase activity slowly   Complete by:  As directed      Allergies  as of 03/22/2018   No Known Allergies     Medication List    STOP taking these medications   ondansetron 4 MG disintegrating tablet Commonly known as:  ZOFRAN ODT     TAKE these medications   albuterol 108 (90 Base) MCG/ACT inhaler Commonly known as:  PROVENTIL HFA;VENTOLIN HFA Inhale 2 puffs into the lungs every 4 (four) hours as needed for wheezing or shortness of breath (or coughing).   aspirin 81 MG chewable tablet Chew 81 mg by mouth daily.   furosemide 40 MG tablet Commonly known as:  LASIX Take 1 tablet (40 mg total) by mouth as needed. Please make appointment.   lactulose 10 GM/15ML solution Commonly known as:  CHRONULAC Take 45 mLs (30 g total) by mouth 2 (two) times daily. Titrate for 3-4 BM's/ day What changed:    when to take this  reasons to take this   losartan 25 MG tablet Commonly known as:  COZAAR Take 1 tablet (25 mg total) by mouth daily. KEEP OV.   metoprolol tartrate 50 MG tablet Commonly known as:  LOPRESSOR Take 1 tablet (50 mg total) by mouth 2 (two) times daily.   omeprazole 20 MG capsule Commonly known as:  PRILOSEC Take 1  capsule (20 mg total) by mouth daily.   simvastatin 20 MG tablet Commonly known as:  ZOCOR Take 1 tablet (20 mg total) by mouth daily. Please make appointment.      Follow-up Information    [facility] Follow up.   Why:  Home Health Physical Therapy, Occupational Therapy, RN, aide - agency will call to arrange initial visit Contact information: [address] [place] [phone]         No Known Allergies  Consultations:  None  Procedures/Studies: Ct Abdomen Pelvis Wo Contrast  Result Date: 03/20/2018 CLINICAL DATA:  Intermittent mid abdominal pain for the past month. Nausea and vomiting yesterday. EXAM: CT ABDOMEN AND PELVIS WITHOUT CONTRAST TECHNIQUE: Multidetector CT imaging of the abdomen and pelvis was performed following the standard protocol without IV contrast.  COMPARISON:  CT abdomen pelvis dated September 08, 2017. FINDINGS: Lower chest: No acute abnormality. Hepatobiliary: Nodular liver contour, consistent with cirrhosis. No focal liver abnormality. Unchanged cholelithiasis. No gallbladder wall thickening or biliary dilatation. Pancreas: Unremarkable. No pancreatic ductal dilatation or surrounding inflammatory changes. Spleen: Normal in size without focal abnormality. Adrenals/Urinary Tract: Adrenal glands are unremarkable. Kidneys are normal, without renal calculi, focal lesion, or hydronephrosis. Bladder is unremarkable. Stomach/Bowel: Stomach is within normal limits. Appendix appears normal. No evidence of bowel wall thickening, distention, or inflammatory changes. Sigmoid diverticulosis. Vascular/Lymphatic: Large paraesophageal varices and recannulated umbilical vein again noted. Aortic atherosclerosis. No enlarged abdominal or pelvic lymph nodes. Reproductive: Prostatic calcifications, unchanged. Other: No free fluid or pneumoperitoneum. Unchanged small fat containing umbilical and left inguinal hernias. Musculoskeletal: No acute or significant osseous findings. Degenerative changes of the lumbar spine. IMPRESSION: 1.  No acute intra-abdominal process. 2. Cirrhosis with sequelae of portal hypertension. 3. Cholelithiasis. 4.  Aortic atherosclerosis (ICD10-I70.0). Electronically Signed   By: Titus Dubin M.D.   On: 03/20/2018 22:46   Dg Chest 2 View  Result Date: 03/20/2018 CLINICAL DATA:  67 year old male with history of liver disease. Confusion. Shortness of breath. EXAM: CHEST - 2 VIEW COMPARISON:  09/07/2017 and earlier. FINDINGS: Semi upright AP and lateral views of the chest. Stable cardiomegaly and mediastinal contours. Lung volumes are within normal limits. Prior CABG. Stable left chest cardiac pacemaker. Visualized tracheal air column is within normal limits. No pneumothorax, pulmonary edema, pleural effusion or confluent pulmonary opacity. Negative  visible bowel gas pattern. No acute osseous abnormality identified. IMPRESSION: No acute cardiopulmonary abnormality. Electronically Signed   By: Genevie Ann M.D.   On: 03/20/2018 19:20   Ct Head Wo Contrast  Result Date: 03/20/2018 CLINICAL DATA:  67 year old male with history of liver disease. Confusion. EXAM: CT HEAD WITHOUT CONTRAST TECHNIQUE: Contiguous axial images were obtained from the base of the skull through the vertex without intravenous contrast. COMPARISON:  Head CT without contrast 09/07/2017. FINDINGS: Brain: Stable cerebral volume. No midline shift, ventriculomegaly, mass effect, evidence of mass lesion, intracranial hemorrhage or evidence of cortically based acute infarction. Patchy and confluent bilateral cerebral white matter hypodensity appears stable. Vascular: Calcified atherosclerosis at the skull base. No suspicious intracranial vascular hyperdensity. Skull: No acute osseous abnormality identified. Sinuses/Orbits: Visualized paranasal sinuses and mastoids are stable and well pneumatized. Other: No acute orbit or scalp soft tissue findings. IMPRESSION: 1.  No acute intracranial abnormality. 2. Stable non contrast CT appearance of the brain with moderate nonspecific cerebral white matter changes. Electronically Signed   By: Genevie Ann M.D.   On: 03/20/2018 19:19    Subjective: Seen and examined at bedside and  was feeling better. Ambulated well with PT. No CP or SOB. Abdominal pain improved. No other concerns or complaints at this time.   Discharge Exam: Vitals:   03/22/18 0933 03/22/18 1351  BP: 127/80 129/82  Pulse: 62 60  Resp:  18  Temp:  97.7 F (36.5 C)  SpO2:  98%   Vitals:   03/21/18 2200 03/22/18 0525 03/22/18 0933 03/22/18 1351  BP: 132/89 126/86 127/80 129/82  Pulse: 73 60 62 60  Resp: 20 18  18   Temp: 98.5 F (36.9 C) 98.2 F (36.8 C)  97.7 F (36.5 C)  TempSrc: Oral Oral  Oral  SpO2: 97% 99%  98%  Weight:  97.9 kg (215 lb 13.3 oz)    Height:       General:  Pt is alert, awake, not in acute distress Cardiovascular: RRR, S1/S2 +, no rubs, no gallops Respiratory: Diminished bilaterally, no wheezing, no rhonchi Abdominal: Soft, NT, Slightly distende, bowel sounds + Extremities: mild edema, no cyanosis  The results of significant diagnostics from this hospitalization (including imaging, microbiology, ancillary and laboratory) are listed below for reference.    Microbiology: No results found for this or any previous visit (from the past 240 hour(s)).   Labs: BNP (last 3 results) Recent Labs    04/04/17 0912  BNP 59.5   Basic Metabolic Panel: Recent Labs  Lab 03/20/18 1620 03/21/18 0427 03/21/18 1634 03/22/18 0356 03/22/18 0858  NA 141 141 142  --  140  K 3.7 3.0* 4.1  --  4.0  CL 107 108 110  --  108  CO2 28 26 27   --  27  GLUCOSE 124* 60* 102*  --  168*  BUN 10 10 9   --  8  CREATININE 0.93 0.83 0.77  --  0.98  CALCIUM 8.3* 8.4* 8.4*  --  8.4*  MG  --   --   --  1.7  --   PHOS  --   --   --  2.7  --    Liver Function Tests: Recent Labs  Lab 03/20/18 1620 03/21/18 0427 03/21/18 1634 03/22/18 0858  AST 58* 52* 54* 51*  ALT 34 32 32 32  ALKPHOS 118 110 112 101  BILITOT 2.4* 2.0* 1.9* 2.3*  PROT 7.2 6.8 6.8 6.7  ALBUMIN 2.5* 2.3* 2.4* 2.3*   Recent Labs  Lab 03/21/18 0427  LIPASE 40   Recent Labs  Lab 03/20/18 1918 03/21/18 0427 03/22/18 0356  AMMONIA 209* 188* 85*   CBC: Recent Labs  Lab 03/20/18 1620 03/21/18 0427 03/22/18 0356  WBC 3.5* 6.3 5.0  NEUTROABS  --  1.6* 2.1  HGB 16.2 15.3 14.6  HCT 47.1 44.3 42.6  MCV 95.7 96.1 96.8  PLT 48* 45* 55*   Cardiac Enzymes: Recent Labs  Lab 03/21/18 0427 03/21/18 0729 03/21/18 1320 03/21/18 1840  TROPONINI 0.04* 0.04* 0.03* 0.03*   BNP: Invalid input(s): POCBNP CBG: Recent Labs  Lab 03/21/18 2057 03/22/18 0051 03/22/18 0525 03/22/18 0740 03/22/18 1158  GLUCAP 125* 86 82 119* 75   D-Dimer No results for input(s): DDIMER in the last 72  hours. Hgb A1c No results for input(s): HGBA1C in the last 72 hours. Lipid Profile No results for input(s): CHOL, HDL, LDLCALC, TRIG, CHOLHDL, LDLDIRECT in the last 72 hours. Thyroid function studies No results for input(s): TSH, T4TOTAL, T3FREE, THYROIDAB in the last 72 hours.  Invalid input(s): FREET3 Anemia work up No results for input(s): VITAMINB12, FOLATE, FERRITIN, TIBC, IRON, RETICCTPCT in  the last 72 hours. Urinalysis    Component Value Date/Time   COLORURINE AMBER (A) 09/07/2017 1727   APPEARANCEUR CLOUDY (A) 09/07/2017 1727   LABSPEC 1.019 09/07/2017 1727   PHURINE 7.0 09/07/2017 1727   GLUCOSEU NEGATIVE 09/07/2017 1727   HGBUR SMALL (A) 09/07/2017 1727   BILIRUBINUR SMALL (A) 09/07/2017 1727   KETONESUR NEGATIVE 09/07/2017 1727   PROTEINUR NEGATIVE 09/07/2017 1727   NITRITE NEGATIVE 09/07/2017 1727   LEUKOCYTESUR TRACE (A) 09/07/2017 1727   Sepsis Labs Invalid input(s): PROCALCITONIN,  WBC,  LACTICIDVEN Microbiology No results found for this or any previous visit (from the past 240 hour(s)).  Time coordinating discharge: 35 minutes  SIGNED:  Kerney Elbe, DO [facility] 03/25/2018, 11:36 PM Pager [phone]  If 7PM-7AM, please contact night-coverage [email or web address] Password [email or web address]

## 2018-03-22 NOTE — Evaluation (Signed)
Occupational Therapy Evaluation Patient Details Name: Hunter Carter MRN: [id number] DOB: 14-Jan-1951 Today's Date: 03/22/2018    History of Present Illness Hunter Carter is a 67 y.o. male with history of cirrhosis of the liver secondary to hepatitis C and previous alcoholism, CAD status post CABG, hypertension, paroxysmal atrial fibrillation, chronic thrombocytopenia and other comorbidites who was brought to the ER after patient was found to be having increasing confusion over the last 3 days with some drooling of saliva. Pt admitted with hepatic encephalopathy   Clinical Impression   Pt admitted with the above diagnosis/. Pt currently with functional limitations due to the deficits listed below (see OT Problem List).  Pt will benefit from skilled OT to increase their safety and independence with ADL and functional mobility for ADL to facilitate discharge to venue listed below.   Spoke with pt and daughter who verbalized they had started process to connect Mr Adriano with [facility].  Pt did verbalize sometimes he often had trouble getting out of the bed and taking care of him self (motivation limiting and pt aware)    Follow Up Recommendations  Home health OT;Supervision - Intermittent    Equipment Recommendations  None recommended by OT    Recommendations for Other Services       Precautions / Restrictions Precautions Precautions: Fall Precaution Comments: pt reports no fall in last 6 monthes Restrictions Weight Bearing Restrictions: No      Mobility Bed Mobility Overal bed mobility: Needs Assistance Bed Mobility: Supine to Sit     Supine to sit: Min guard        Transfers Overall transfer level: Needs assistance Equipment used: 1 person hand held assist Transfers: Sit to/from [facility] Sit to Stand: Min guard Stand pivot transfers: Min guard       General transfer comment: VC for safety    Balance Overall balance assessment: Mild deficits  observed, not formally tested                                         ADL either performed or assessed with clinical judgement   ADL Overall ADL's : Needs assistance/impaired Eating/Feeding: Set up;Sitting   Grooming: Standing;Min guard   Upper Body Bathing: Set up;Sitting   Lower Body Bathing: Minimal assistance;Sit to/from stand;Cueing for safety;Cueing for sequencing   Upper Body Dressing : Set up;Sitting   Lower Body Dressing: Minimal assistance;Sit to/from stand   Toilet Transfer: Min guard;Comfort height toilet;Ambulation   Toileting- Clothing Manipulation and Hygiene: Min guard;Sit to/from stand       Functional mobility during ADLs: Min guard       Vision Patient Visual Report: No change from baseline       Perception     Praxis      Pertinent Vitals/Pain Pain Assessment: No/denies pain     Hand Dominance     Extremity/Trunk Assessment Upper Extremity Assessment Upper Extremity Assessment: Generalized weakness           Communication Communication Communication: No difficulties   Cognition Arousal/Alertness: Awake/alert Behavior During Therapy: WFL for tasks assessed/performed Overall Cognitive Status: Within Functional Limits for tasks assessed  Home Living Family/patient expects to be discharged to:: Private residence Living Arrangements: Alone Available Help at Discharge: Family;Available PRN/intermittently   Home Access: Level entry     Home Layout: One level     Bathroom Shower/Tub: Teacher, early years/pre: Standard     Home Equipment: Cane - single point          Prior Functioning/Environment          Comments: pt able to drive to store        OT Problem List: Decreased strength;Decreased safety awareness;Decreased knowledge of use of DME or AE      OT Treatment/Interventions: Self-care/ADL training;Patient/family  education;Therapeutic exercise;Therapeutic activities    OT Goals(Current goals can be found in the care plan section) Acute Rehab OT Goals Patient Stated Goal: home and sign up with [facility] OT Goal Formulation: With patient Time For Goal Achievement: 04/05/18 Potential to Achieve Goals: Good ADL Goals Pt Will Perform Lower Body Dressing: with modified independence;sit to/from stand Pt Will Transfer to Toilet: with modified independence;ambulating;regular height toilet Pt Will Perform Toileting - Clothing Manipulation and hygiene: with modified independence;sit to/from stand Pt Will Perform Tub/Shower Transfer: with modified independence;Tub transfer  OT Frequency: Min 2X/week   Barriers to D/C:               AM-PAC PT "6 Clicks" Daily Activity     Outcome Measure Help from another person eating meals?: None Help from another person taking care of personal grooming?: A Little Help from another person toileting, which includes using toliet, bedpan, or urinal?: A Little Help from another person bathing (including washing, rinsing, drying)?: A Little Help from another person to put on and taking off regular upper body clothing?: None Help from another person to put on and taking off regular lower body clothing?: A Little 6 Click Score: 20   End of Session Nurse Communication: Mobility status  Activity Tolerance: Patient tolerated treatment well Patient left: in chair  OT Visit Diagnosis: Unsteadiness on feet (R26.81);Muscle weakness (generalized) (M62.81);Other abnormalities of gait and mobility (R26.89)                Time: 1000-1030 OT Time Calculation (min): 30 min Charges:  OT General Charges $OT Visit: 1 Visit OT Evaluation $OT Eval Moderate Complexity: 1 Mod OT Treatments $Self Care/Home Management : 8-22 mins G-Codes:     Kari Baars, OT [phone]  Payton Mccallum D 03/22/2018, 11:37 AM

## 2018-03-22 NOTE — Care Management Note (Signed)
Case Management Note  Patient Details  Name: Hunter Carter MRN: [id number] Date of Birth: [birth year]  Subjective/Objective:  CHF, acute hepatic encephalopathy, HTN, afib                  Action/Plan: please see previous NCM notes  NCM spoke to dtr and explained Medicaid card is not on file. She states she will try to locate. Contacted [facility] to make aware of dc today.   Expected Discharge Date:  03/22/18               Expected Discharge Plan:  [facility]  In-House Referral:  NA  Discharge planning Services  CM Consult  Post Acute Care Choice:  Home Health Choice offered to:  Patient, Adult Children  DME Arranged:  N/A DME Agency:  NA  HH Arranged:  RN, PT, OT, Nurse's Aide [place] Agency:  [facility]  Status of Service:  Completed, signed off  If discussed at [place] of Stay Meetings, dates discussed:    Additional Comments:  Erenest Rasher, RN 03/22/2018, 4:13 PM

## 2018-03-22 NOTE — Evaluation (Signed)
Physical Therapy Evaluation Patient Details Name: Hunter Carter MRN: [id number] DOB: [birth year] Today's Date: 03/22/2018   History of Present Illness  Hunter Carter is a 67 y.o. male with history of cirrhosis of the liver secondary to hepatitis C and previous alcoholism, CAD status post CABG, hypertension, paroxysmal atrial fibrillation, chronic thrombocytopenia and other comorbidites who was brought to the ER after patient was found to be having increasing confusion over the last 3 days with some drooling of saliva. Pt admitted with hepatic encephalopathy  Clinical Impression  Pt with noted decreased activity tolerance during ambulation today and used RW. Pt stated he uses a cane in his home and doesn't think he could use RW due to it being small. Feel pt would benefit from continued PT services with HHPT to assess safety and transition pt safely (or with PACe program) . Will continue to follow while in acute.     Follow Up Recommendations Home health PT    Equipment Recommendations       Recommendations for Other Services       Precautions / Restrictions Precautions Precautions: Fall Precaution Comments: pt reports no fall in last 6 monthes Restrictions Weight Bearing Restrictions: No      Mobility  Bed Mobility Overal bed mobility: Needs Assistance Bed Mobility: Supine to Sit     Supine to sit: Supervision        Transfers Overall transfer level: Needs assistance Equipment used: 1 person hand held assist Transfers: Sit to/from Stand;Stand Pivot Transfers Sit to Stand: Supervision Stand pivot transfers: Min guard;Supervision          Ambulation/Gait Ambulation/Gait assistance: Min guard Ambulation Distance (Feet): 200 Feet Assistive device: Rolling walker (2 wheeled)       General Gait Details: tired easily, had adible wheezing at the end of the walk.   Stairs            Wheelchair Mobility    Modified Rankin (Stroke Patients Only)        Balance Overall balance assessment: Mild deficits observed, not formally tested                                           Pertinent Vitals/Pain Pain Assessment: No/denies pain    Home Living Family/patient expects to be discharged to:: Private residence Living Arrangements: Alone Available Help at Discharge: Family;Available PRN/intermittently   Home Access: Level entry     Home Layout: One level Home Equipment: Cane - single point Additional Comments: pt lives alone and daughter able to check on him occassionally. Pt states he uses a cane to steady himself occassionally due to feeling dizzy occassionally.     Prior Function           Comments: pt able to drive to store     Hand Dominance        Extremity/Trunk Assessment        Lower Extremity Assessment Lower Extremity Assessment: Overall WFL for tasks assessed       Communication   Communication: No difficulties  Cognition Arousal/Alertness: Awake/alert Behavior During Therapy: WFL for tasks assessed/performed Overall Cognitive Status: Within Functional Limits for tasks assessed                                        General Comments  Exercises     Assessment/Plan    PT Assessment Patient needs continued PT services  PT Problem List Decreased mobility;Decreased activity tolerance;Decreased strength       PT Treatment Interventions Gait training;Functional mobility training;Therapeutic activities;Therapeutic exercise;Patient/family education    PT Goals (Current goals can be found in the Care Plan section)  Acute Rehab PT Goals Patient Stated Goal: home and sign up with PACE PT Goal Formulation: With patient Time For Goal Achievement: 04/05/18 Potential to Achieve Goals: Good    Frequency Min 3X/week   Barriers to discharge        Co-evaluation               AM-PAC PT "6 Clicks" Daily Activity  Outcome Measure Difficulty turning over in  bed (including adjusting bedclothes, sheets and blankets)?: None Difficulty moving from lying on back to sitting on the side of the bed? : None Difficulty sitting down on and standing up from a chair with arms (e.g., wheelchair, bedside commode, etc,.)?: None Help needed moving to and from a bed to chair (including a wheelchair)?: A Little Help needed walking in hospital room?: A Little Help needed climbing 3-5 steps with a railing? : A Little 6 Click Score: 21    End of Session Equipment Utilized During Treatment: Gait belt Activity Tolerance: Patient tolerated treatment well Patient left: in bed Nurse Communication: Mobility status PT Visit Diagnosis: Other abnormalities of gait and mobility (R26.89)    Time: 6606-0045 PT Time Calculation (min) (ACUTE ONLY): 20 min   Charges:   PT Evaluation $PT Eval Low Complexity: 1 Low PT Treatments $Gait Training: 8-22 mins   PT G CodesClide Dales, PT Pager: [phone] 03/22/2018   Hunter Carter, Hunter Carter 03/22/2018, 3:56 PM

## 2018-03-25 ENCOUNTER — Other Ambulatory Visit: Payer: Self-pay | Admitting: Nurse Practitioner

## 2018-03-25 DIAGNOSIS — K7200 Acute and subacute hepatic failure without coma: Secondary | ICD-10-CM | POA: Diagnosis not present

## 2018-03-25 DIAGNOSIS — I110 Hypertensive heart disease with heart failure: Secondary | ICD-10-CM | POA: Diagnosis not present

## 2018-03-25 DIAGNOSIS — K7469 Other cirrhosis of liver: Secondary | ICD-10-CM

## 2018-03-25 DIAGNOSIS — K766 Portal hypertension: Secondary | ICD-10-CM | POA: Diagnosis not present

## 2018-03-26 ENCOUNTER — Telehealth: Payer: Self-pay | Admitting: *Deleted

## 2018-03-26 DIAGNOSIS — K7200 Acute and subacute hepatic failure without coma: Secondary | ICD-10-CM | POA: Diagnosis not present

## 2018-03-26 DIAGNOSIS — I110 Hypertensive heart disease with heart failure: Secondary | ICD-10-CM | POA: Diagnosis not present

## 2018-03-26 DIAGNOSIS — K766 Portal hypertension: Secondary | ICD-10-CM | POA: Diagnosis not present

## 2018-03-26 NOTE — Telephone Encounter (Signed)
Patient will need home health RN, PT, and OT.  Need verbal order for these.  Also, needs to know if patient is up-to-date on vaccines.  RN would like to speak with someone about his med list.  Will route note to clinic RNs and PCP.  Burna Forts, BSN, RN-BC

## 2018-03-26 NOTE — Telephone Encounter (Signed)
VM unidentifiable.  LM for [place] to call back.  1.  Will give verbal orders provided by MD  2. Pt is up to date on flu and Tdap only. [place], Salome Spotted, CMA

## 2018-03-26 NOTE — Telephone Encounter (Signed)
Fenton Malling called back.  Verbal orders and immunizations given.   MED list discrepancies: 1. Pt is not taking 81mg  Asprin 2. Pt is taking laculose @ 36ml BID (chart shows 68mls) 3. Pt is also taking levocarnitine 330mg  three times a day (not on list)  Will forward to provider. Allison Deshotels, Salome Spotted, CMA

## 2018-03-27 DIAGNOSIS — K766 Portal hypertension: Secondary | ICD-10-CM | POA: Diagnosis not present

## 2018-03-27 DIAGNOSIS — I110 Hypertensive heart disease with heart failure: Secondary | ICD-10-CM | POA: Diagnosis not present

## 2018-03-27 DIAGNOSIS — K7200 Acute and subacute hepatic failure without coma: Secondary | ICD-10-CM | POA: Diagnosis not present

## 2018-03-28 ENCOUNTER — Telehealth: Payer: Self-pay

## 2018-03-28 DIAGNOSIS — I110 Hypertensive heart disease with heart failure: Secondary | ICD-10-CM | POA: Diagnosis not present

## 2018-03-28 DIAGNOSIS — K766 Portal hypertension: Secondary | ICD-10-CM | POA: Diagnosis not present

## 2018-03-28 DIAGNOSIS — K7200 Acute and subacute hepatic failure without coma: Secondary | ICD-10-CM | POA: Diagnosis not present

## 2018-03-28 NOTE — Telephone Encounter (Signed)
Timmothy Sours, OT with [facility], called to let PCP know that OT eval was done today with no OT needs identified.  No call back needed.  Danley Danker, RN [facility] RN)

## 2018-03-31 DIAGNOSIS — I110 Hypertensive heart disease with heart failure: Secondary | ICD-10-CM | POA: Diagnosis not present

## 2018-03-31 DIAGNOSIS — K7200 Acute and subacute hepatic failure without coma: Secondary | ICD-10-CM | POA: Diagnosis not present

## 2018-03-31 DIAGNOSIS — K766 Portal hypertension: Secondary | ICD-10-CM | POA: Diagnosis not present

## 2018-04-03 ENCOUNTER — Telehealth: Payer: Self-pay

## 2018-04-03 DIAGNOSIS — K7200 Acute and subacute hepatic failure without coma: Secondary | ICD-10-CM | POA: Diagnosis not present

## 2018-04-03 DIAGNOSIS — I110 Hypertensive heart disease with heart failure: Secondary | ICD-10-CM | POA: Diagnosis not present

## 2018-04-03 DIAGNOSIS — K766 Portal hypertension: Secondary | ICD-10-CM | POA: Diagnosis not present

## 2018-04-03 NOTE — Telephone Encounter (Signed)
Jeral Fruit, RN with [facility], would like verbal orders to have a medical social work evaluation. Patient would like info on the PACE program.  Call back is [phone]  Danley Danker, RN [facility] [place])

## 2018-04-04 ENCOUNTER — Ambulatory Visit
Admission: RE | Admit: 2018-04-04 | Discharge: 2018-04-04 | Disposition: A | Discharge status: Home / Self Care | Payer: Medicare Other | Source: Ambulatory Visit | Attending: Nurse Practitioner | Admitting: Nurse Practitioner

## 2018-04-04 DIAGNOSIS — K7469 Other cirrhosis of liver: Secondary | ICD-10-CM

## 2018-04-04 DIAGNOSIS — K8020 Calculus of gallbladder without cholecystitis without obstruction: Secondary | ICD-10-CM | POA: Diagnosis not present

## 2018-04-08 DIAGNOSIS — K766 Portal hypertension: Secondary | ICD-10-CM | POA: Diagnosis not present

## 2018-04-08 DIAGNOSIS — K7200 Acute and subacute hepatic failure without coma: Secondary | ICD-10-CM | POA: Diagnosis not present

## 2018-04-08 DIAGNOSIS — I110 Hypertensive heart disease with heart failure: Secondary | ICD-10-CM | POA: Diagnosis not present

## 2018-04-09 DIAGNOSIS — K7200 Acute and subacute hepatic failure without coma: Secondary | ICD-10-CM | POA: Diagnosis not present

## 2018-04-09 DIAGNOSIS — K766 Portal hypertension: Secondary | ICD-10-CM | POA: Diagnosis not present

## 2018-04-09 DIAGNOSIS — I110 Hypertensive heart disease with heart failure: Secondary | ICD-10-CM | POA: Diagnosis not present

## 2018-04-10 DIAGNOSIS — K7200 Acute and subacute hepatic failure without coma: Secondary | ICD-10-CM | POA: Diagnosis not present

## 2018-04-10 DIAGNOSIS — K766 Portal hypertension: Secondary | ICD-10-CM | POA: Diagnosis not present

## 2018-04-10 DIAGNOSIS — I110 Hypertensive heart disease with heart failure: Secondary | ICD-10-CM | POA: Diagnosis not present

## 2018-04-15 DIAGNOSIS — K7200 Acute and subacute hepatic failure without coma: Secondary | ICD-10-CM | POA: Diagnosis not present

## 2018-04-15 DIAGNOSIS — K766 Portal hypertension: Secondary | ICD-10-CM | POA: Diagnosis not present

## 2018-04-15 DIAGNOSIS — I110 Hypertensive heart disease with heart failure: Secondary | ICD-10-CM | POA: Diagnosis not present

## 2018-04-21 ENCOUNTER — Telehealth: Payer: Self-pay | Admitting: Cardiology

## 2018-04-21 ENCOUNTER — Ambulatory Visit (INDEPENDENT_AMBULATORY_CARE_PROVIDER_SITE_OTHER): Payer: Medicare Other | Admitting: *Deleted

## 2018-04-21 DIAGNOSIS — I480 Paroxysmal atrial fibrillation: Secondary | ICD-10-CM | POA: Diagnosis not present

## 2018-04-21 NOTE — Telephone Encounter (Signed)
Spoke with pt and reminded pt of remote transmission that is due today. Pt verbalized understanding.   

## 2018-04-22 DIAGNOSIS — K7290 Hepatic failure, unspecified without coma: Secondary | ICD-10-CM | POA: Diagnosis not present

## 2018-04-22 DIAGNOSIS — K7469 Other cirrhosis of liver: Secondary | ICD-10-CM | POA: Diagnosis not present

## 2018-04-22 NOTE — Progress Notes (Signed)
Remote pacemaker transmission.   

## 2018-04-29 ENCOUNTER — Other Ambulatory Visit: Payer: Self-pay | Admitting: Family Medicine

## 2018-05-07 LAB — CUP PACEART REMOTE DEVICE CHECK
Battery Impedance: 397 Ohm
Battery Remaining Longevity: 111 mo
Battery Voltage: 2.78 V
Brady Statistic AP VP Percent: 1 %
Brady Statistic AS VP Percent: 0 %
Implantable Lead Implant Date: 20050527
Implantable Lead Implant Date: 20110614
Implantable Lead Location: 753859
Implantable Lead Location: 753860
Implantable Lead Model: 5076
Implantable Pulse Generator Implant Date: 20110614
Lead Channel Impedance Value: 469 Ohm
Lead Channel Impedance Value: 617 Ohm
Lead Channel Pacing Threshold Pulse Width: 0.4 ms
Lead Channel Pacing Threshold Pulse Width: 0.4 ms
Lead Channel Setting Pacing Amplitude: 2 V
Lead Channel Setting Sensing Sensitivity: 4 mV
MDC IDC MSMT LEADCHNL RA PACING THRESHOLD AMPLITUDE: 0.875 V
MDC IDC MSMT LEADCHNL RV PACING THRESHOLD AMPLITUDE: 0.875 V
MDC IDC SESS DTM: 20190708184839
MDC IDC SET LEADCHNL RA PACING AMPLITUDE: 1.75 V
MDC IDC SET LEADCHNL RV PACING PULSEWIDTH: 0.4 ms
MDC IDC STAT BRADY AP VS PERCENT: 61 %
MDC IDC STAT BRADY AS VS PERCENT: 38 %

## 2018-06-20 ENCOUNTER — Other Ambulatory Visit: Payer: Self-pay

## 2018-06-20 ENCOUNTER — Other Ambulatory Visit: Payer: Self-pay | Admitting: Family Medicine

## 2018-06-20 NOTE — Patient Outreach (Signed)
[facility]) Care Management  06/20/2018  Hunter Carter [birth year] [id number]   Medication Adherence call to [name] spoke with patient he said he has enough of Losartan 25 mg but on Simvastatin he ask if we can call [facility] and order this medication along three other medication. [facility] said they will mail them out between 7-10 days . Hunter Carter is showing past due under [place].  [place] Management Direct Dial [phone]  Fax [phone] [email or web address]

## 2018-07-14 ENCOUNTER — Other Ambulatory Visit: Payer: Self-pay | Admitting: Family Medicine

## 2018-07-16 ENCOUNTER — Telehealth: Payer: Self-pay | Admitting: Cardiovascular Disease

## 2018-07-16 NOTE — Telephone Encounter (Signed)
Left vm with [employer] asked them to fax over EF% form before records can be release.

## 2018-07-16 NOTE — Telephone Encounter (Signed)
ew Message:      She needs the pt's last Blood Pressure, Pluse and Ejection Fraction. You can fax to [phone].

## 2018-07-20 ENCOUNTER — Inpatient Hospital Stay (HOSPITAL_COMMUNITY)
Admission: EM | Admit: 2018-07-20 | Discharge: 2018-07-24 | DRG: 445 | Disposition: A | Discharge status: Home / Self Care | Payer: Medicare Other | Attending: Family Medicine | Admitting: Family Medicine

## 2018-07-20 ENCOUNTER — Emergency Department (HOSPITAL_COMMUNITY): Payer: Medicare Other

## 2018-07-20 ENCOUNTER — Observation Stay (HOSPITAL_COMMUNITY): Payer: Medicare Other

## 2018-07-20 ENCOUNTER — Encounter (HOSPITAL_COMMUNITY): Payer: Self-pay

## 2018-07-20 ENCOUNTER — Other Ambulatory Visit: Payer: Self-pay

## 2018-07-20 DIAGNOSIS — D6959 Other secondary thrombocytopenia: Secondary | ICD-10-CM | POA: Diagnosis not present

## 2018-07-20 DIAGNOSIS — I10 Essential (primary) hypertension: Secondary | ICD-10-CM | POA: Diagnosis not present

## 2018-07-20 DIAGNOSIS — K219 Gastro-esophageal reflux disease without esophagitis: Secondary | ICD-10-CM | POA: Diagnosis not present

## 2018-07-20 DIAGNOSIS — R1084 Generalized abdominal pain: Secondary | ICD-10-CM | POA: Diagnosis not present

## 2018-07-20 DIAGNOSIS — R188 Other ascites: Secondary | ICD-10-CM | POA: Diagnosis present

## 2018-07-20 DIAGNOSIS — I2510 Atherosclerotic heart disease of native coronary artery without angina pectoris: Secondary | ICD-10-CM | POA: Diagnosis not present

## 2018-07-20 DIAGNOSIS — K8020 Calculus of gallbladder without cholecystitis without obstruction: Secondary | ICD-10-CM | POA: Diagnosis not present

## 2018-07-20 DIAGNOSIS — M1990 Unspecified osteoarthritis, unspecified site: Secondary | ICD-10-CM | POA: Diagnosis present

## 2018-07-20 DIAGNOSIS — B1920 Unspecified viral hepatitis C without hepatic coma: Secondary | ICD-10-CM | POA: Diagnosis present

## 2018-07-20 DIAGNOSIS — K7030 Alcoholic cirrhosis of liver without ascites: Secondary | ICD-10-CM | POA: Diagnosis present

## 2018-07-20 DIAGNOSIS — Z86010 Personal history of colonic polyps: Secondary | ICD-10-CM

## 2018-07-20 DIAGNOSIS — I480 Paroxysmal atrial fibrillation: Secondary | ICD-10-CM | POA: Diagnosis present

## 2018-07-20 DIAGNOSIS — I252 Old myocardial infarction: Secondary | ICD-10-CM | POA: Diagnosis not present

## 2018-07-20 DIAGNOSIS — K7460 Unspecified cirrhosis of liver: Secondary | ICD-10-CM | POA: Diagnosis not present

## 2018-07-20 DIAGNOSIS — Z951 Presence of aortocoronary bypass graft: Secondary | ICD-10-CM | POA: Diagnosis not present

## 2018-07-20 DIAGNOSIS — K8000 Calculus of gallbladder with acute cholecystitis without obstruction: Secondary | ICD-10-CM | POA: Diagnosis not present

## 2018-07-20 DIAGNOSIS — Z8249 Family history of ischemic heart disease and other diseases of the circulatory system: Secondary | ICD-10-CM | POA: Diagnosis not present

## 2018-07-20 DIAGNOSIS — Z9889 Other specified postprocedural states: Secondary | ICD-10-CM | POA: Diagnosis not present

## 2018-07-20 DIAGNOSIS — Z8711 Personal history of peptic ulcer disease: Secondary | ICD-10-CM | POA: Diagnosis not present

## 2018-07-20 DIAGNOSIS — E785 Hyperlipidemia, unspecified: Secondary | ICD-10-CM | POA: Diagnosis present

## 2018-07-20 DIAGNOSIS — Z950 Presence of cardiac pacemaker: Secondary | ICD-10-CM | POA: Diagnosis present

## 2018-07-20 DIAGNOSIS — Z87891 Personal history of nicotine dependence: Secondary | ICD-10-CM | POA: Diagnosis not present

## 2018-07-20 DIAGNOSIS — R1013 Epigastric pain: Secondary | ICD-10-CM | POA: Diagnosis not present

## 2018-07-20 DIAGNOSIS — K828 Other specified diseases of gallbladder: Secondary | ICD-10-CM | POA: Diagnosis present

## 2018-07-20 DIAGNOSIS — R109 Unspecified abdominal pain: Secondary | ICD-10-CM | POA: Diagnosis present

## 2018-07-20 DIAGNOSIS — Z79899 Other long term (current) drug therapy: Secondary | ICD-10-CM | POA: Diagnosis not present

## 2018-07-20 DIAGNOSIS — J449 Chronic obstructive pulmonary disease, unspecified: Secondary | ICD-10-CM | POA: Diagnosis not present

## 2018-07-20 DIAGNOSIS — D696 Thrombocytopenia, unspecified: Secondary | ICD-10-CM | POA: Diagnosis present

## 2018-07-20 DIAGNOSIS — I5032 Chronic diastolic (congestive) heart failure: Secondary | ICD-10-CM | POA: Diagnosis present

## 2018-07-20 DIAGNOSIS — Z96652 Presence of left artificial knee joint: Secondary | ICD-10-CM | POA: Diagnosis not present

## 2018-07-20 DIAGNOSIS — D691 Qualitative platelet defects: Secondary | ICD-10-CM | POA: Diagnosis not present

## 2018-07-20 DIAGNOSIS — I110 Hypertensive heart disease with heart failure: Secondary | ICD-10-CM | POA: Diagnosis not present

## 2018-07-20 DIAGNOSIS — K7469 Other cirrhosis of liver: Secondary | ICD-10-CM | POA: Diagnosis not present

## 2018-07-20 DIAGNOSIS — I25118 Atherosclerotic heart disease of native coronary artery with other forms of angina pectoris: Secondary | ICD-10-CM | POA: Diagnosis not present

## 2018-07-20 LAB — URINALYSIS, ROUTINE W REFLEX MICROSCOPIC
BACTERIA UA: NONE SEEN
Bilirubin Urine: NEGATIVE
Glucose, UA: NEGATIVE mg/dL
KETONES UR: NEGATIVE mg/dL
LEUKOCYTES UA: NEGATIVE
Nitrite: NEGATIVE
PH: 7 (ref 5.0–8.0)
Protein, ur: NEGATIVE mg/dL
Specific Gravity, Urine: 1.034 — ABNORMAL HIGH (ref 1.005–1.030)

## 2018-07-20 LAB — COMPREHENSIVE METABOLIC PANEL
ALBUMIN: 2.5 g/dL — AB (ref 3.5–5.0)
ALT: 31 U/L (ref 0–44)
AST: 51 U/L — AB (ref 15–41)
Alkaline Phosphatase: 130 U/L — ABNORMAL HIGH (ref 38–126)
Anion gap: 9 (ref 5–15)
BUN: 9 mg/dL (ref 8–23)
CHLORIDE: 103 mmol/L (ref 98–111)
CO2: 25 mmol/L (ref 22–32)
Calcium: 8.6 mg/dL — ABNORMAL LOW (ref 8.9–10.3)
Creatinine, Ser: 0.85 mg/dL (ref 0.61–1.24)
GFR calc Af Amer: >60 mL/min (ref >60)
GLUCOSE: 156 mg/dL — AB (ref 70–99)
POTASSIUM: 3.4 mmol/L — AB (ref 3.5–5.1)
SODIUM: 137 mmol/L (ref 135–145)
Total Bilirubin: 5.8 mg/dL — ABNORMAL HIGH (ref 0.3–1.2)
Total Protein: 7.3 g/dL (ref 6.5–8.1)

## 2018-07-20 LAB — CBC
HEMATOCRIT: 46.1 % (ref 39.0–52.0)
Hemoglobin: 16 g/dL (ref 13.0–17.0)
MCH: 33.8 pg (ref 26.0–34.0)
MCHC: 34.7 g/dL (ref 30.0–36.0)
MCV: 97.3 fL (ref 78.0–100.0)
Platelets: 42 10*3/uL — ABNORMAL LOW (ref 150–400)
RBC: 4.74 MIL/uL (ref 4.22–5.81)
RDW: 13.9 % (ref 11.5–15.5)
WBC: 12.5 10*3/uL — AB (ref 4.0–10.5)

## 2018-07-20 LAB — LIPASE, BLOOD: Lipase: 25 U/L (ref 11–51)

## 2018-07-20 MED ORDER — SODIUM CHLORIDE 0.9 % IJ SOLN
INTRAMUSCULAR | Status: AC
  Filled 2018-07-20: qty 50

## 2018-07-20 MED ORDER — METRONIDAZOLE IN NACL 5-0.79 MG/ML-% IV SOLN
500.0000 mg | Freq: Three times a day (TID) | INTRAVENOUS | Status: DC
  Administered 2018-07-21 – 2018-07-24 (×10): 500 mg via INTRAVENOUS
  Filled 2018-07-20 (×10): qty 100

## 2018-07-20 MED ORDER — IOPAMIDOL (ISOVUE-300) INJECTION 61%
100.0000 mL | Freq: Once | INTRAVENOUS | Status: AC | PRN
  Administered 2018-07-20: 100 mL via INTRAVENOUS

## 2018-07-20 MED ORDER — ACETAMINOPHEN 325 MG PO TABS
650.0000 mg | ORAL_TABLET | Freq: Four times a day (QID) | ORAL | Status: DC | PRN
  Administered 2018-07-20 – 2018-07-22 (×7): 650 mg via ORAL
  Administered 2018-07-23: 325 mg via ORAL
  Administered 2018-07-24: 650 mg via ORAL
  Filled 2018-07-20 (×10): qty 2

## 2018-07-20 MED ORDER — GI COCKTAIL ~~LOC~~
30.0000 mL | Freq: Once | ORAL | Status: AC
  Administered 2018-07-20: 30 mL via ORAL
  Filled 2018-07-20: qty 30

## 2018-07-20 MED ORDER — ONDANSETRON HCL 4 MG PO TABS: 4.0000 mg | ORAL_TABLET | Freq: Four times a day (QID) | ORAL | Status: DC | PRN

## 2018-07-20 MED ORDER — SODIUM CHLORIDE 0.9 % IV SOLN
2.0000 g | INTRAVENOUS | Status: DC
  Administered 2018-07-21 – 2018-07-24 (×4): 2 g via INTRAVENOUS
  Filled 2018-07-20: qty 20
  Filled 2018-07-20 (×3): qty 2

## 2018-07-20 MED ORDER — METRONIDAZOLE IN NACL 5-0.79 MG/ML-% IV SOLN
500.0000 mg | Freq: Once | INTRAVENOUS | Status: AC
  Administered 2018-07-20: 500 mg via INTRAVENOUS
  Filled 2018-07-20 (×2): qty 100

## 2018-07-20 MED ORDER — ONDANSETRON HCL 4 MG/2ML IJ SOLN: 4.0000 mg | Freq: Four times a day (QID) | INTRAMUSCULAR | Status: DC | PRN

## 2018-07-20 MED ORDER — SODIUM CHLORIDE 0.9 % IV BOLUS
1000.0000 mL | Freq: Once | INTRAVENOUS | Status: AC
  Administered 2018-07-20: 1000 mL via INTRAVENOUS

## 2018-07-20 MED ORDER — FAMOTIDINE IN NACL 20-0.9 MG/50ML-% IV SOLN
20.0000 mg | Freq: Once | INTRAVENOUS | Status: AC
  Administered 2018-07-20: 20 mg via INTRAVENOUS
  Filled 2018-07-20: qty 50

## 2018-07-20 MED ORDER — SODIUM CHLORIDE 0.9 % IV SOLN
INTRAVENOUS | Status: DC | PRN
  Administered 2018-07-20: 250 mL via INTRAVENOUS

## 2018-07-20 MED ORDER — IOPAMIDOL (ISOVUE-300) INJECTION 61%
INTRAVENOUS | Status: AC
  Filled 2018-07-20: qty 100

## 2018-07-20 MED ORDER — SODIUM CHLORIDE 0.9 % IV SOLN
2.0000 g | Freq: Once | INTRAVENOUS | Status: AC
  Administered 2018-07-20: 2 g via INTRAVENOUS
  Filled 2018-07-20: qty 20

## 2018-07-20 MED ORDER — ACETAMINOPHEN 650 MG RE SUPP: 650.0000 mg | Freq: Four times a day (QID) | RECTAL | Status: DC | PRN

## 2018-07-20 NOTE — ED Notes (Signed)
Ultrasound at bedside, unable to transport at this time

## 2018-07-20 NOTE — ED Triage Notes (Signed)
Pt states that he has not had a "good" bowel movement in 3 days. Pt states that he had some fried food and key lime pie 2 days ago, which he doesn't normally eat. Pt states he has had nausea and vomiting, mostly undigested food. Pt states that he has been taking lactulose to deal with his cirrhosis.

## 2018-07-20 NOTE — Progress Notes (Signed)
PHARMACY NOTE:  ANTIMICROBIAL  DOSAGE ADJUSTMENT  Current antimicrobial regimen includes a mismatch between antimicrobial dosage and indication.  As per policy approved by the Pharmacy & Therapeutics and Medical Executive Committees, the antimicrobial dosage will be adjusted accordingly.  Current antimicrobial dosage:  Rocephin 1 gm IV q24h  Indication: IAI  Renal Function:  Estimated Creatinine Clearance: 95.1 mL/min (by C-G formula based on SCr of 0.85 mg/dL). []      On intermittent HD, scheduled: []      On CRRT    Antimicrobial dosage has been changed to:  Rocephin 2 gm IV q24h     Thank you for allowing pharmacy to be a part of this patient's care.  Dorrene German, [facility] 07/20/2018 10:24 PM

## 2018-07-20 NOTE — H&P (Addendum)
History and Physical    Hunter Carter NWG:[id number] DOB: 02/10/1951 DOA: 07/20/2018  PCP: Nuala Alpha, DO  Patient coming from: Home.  Chief Complaint: Abdominal pain.  HPI: Hunter Carter is a 67 y.o. male with history of cirrhosis from previous history of alcohol abuse and hepatitis C, as per the patient hepatitis C he has been treated, hypertension, CAD status post CABG, COPD, paroxysmal atrial fibrillation, chronic leukopenia and thrombocytopenia presents to the ER because of increasing abdominal pain over the last 3 days.  Pain is diffuse with some nausea and had one episode of vomiting.  Patient states his abdomen is getting more distended.  Usually has 3-4 bowel movements daily with use of lactulose.  ED Course: In the ER abdomen appears mildly distended mild tenderness in the right upper quadrant.  CT abdomen pelvis done shows gallstones.  On-call general surgeon was consulted by ER physician who recommended to get sonogram of the abdomen which also shows gallstones with no definite signs of cholecystitis.  Radiologist has recommended HIDA scan.  Patient has been placed on empiric antibiotics for now.  Will admit for further management of abdominal pain.  LFTs show mild elevation of AST lipase was normal total bilirubin was elevated.  Mild leukocytosis.  Thrombocytopenia which is chronic.  Review of Systems: As per HPI, rest all negative.   Past Medical History:  Diagnosis Date  . Acute hepatic encephalopathy 10/10/2015   [name] 10/10/2015  . Arthritis    "joints ache" (10/10/2015)  . Bleeding stomach ulcer    "recently" (10/10/2015)  . CAD (coronary artery disease)   . CHF (congestive heart failure) ([place])   . Cirrhosis ([place])    due to HCV/notes 10/10/2015  . ETOH abuse   . GERD (gastroesophageal reflux disease)   . Heart block AV second degree    permanent pacemaker 02/2004  . Hepatitis C   . Hyperlipidemia   . Inferior MI ([place]) 2005  . Presence of permanent  cardiac pacemaker   . RBBB   . Systemic hypertension   . Tubulovillous adenoma of colon   . Ventricular tachycardia (HCC)    nonsustained    Past Surgical History:  Procedure Laterality Date  . CARDIAC CATHETERIZATION  03/06/04   significant 3 vessel disease, totalled graft obtuse marginal  . CORONARY ARTERY BYPASS GRAFT  02/10/03   LIMA to LAD,SVG to first diagonal, SVT to OM1  . ESOPHAGOGASTRODUODENOSCOPY N/A 07/29/2015   Procedure: ESOPHAGOGASTRODUODENOSCOPY (EGD);  Surgeon: Clarene Essex, MD;  Location: [facility] ENDOSCOPY;  Service: Endoscopy;  Laterality: N/A;  . INSERT / REPLACE / REMOVE PACEMAKER  03/2010   Medtronic  . KNEE ARTHROSCOPY Left [phone]  . MEDIASTINAL EXPLORATION  02/10/2003  . NM MYOCAR PERF WALL MOTION  04/05/09   low risk scan-abnormal-mod. perfusion defect basal inferoseptal,basal inferior,mid inferoseptal,mid inferior and apical inferior regions  . TOTAL KNEE ARTHROPLASTY Left   . US ECHOCARDIOGRAPHY  01/20/07   mild MR,mild to mod. TR,trace AI,mild PI     reports that he quit smoking about 8 months ago. His smoking use included cigarettes. He has a 10.25 pack-year smoking history. He has never used smokeless tobacco. He reports that he drinks alcohol. He reports that he has current or past drug history. Drug: Marijuana.  No Known Allergies  Family History  Problem Relation Age of Onset  . Heart failure Mother   . Liver disease Father   . Heart failure Sister   . Hypertension Sister   . Hypertension Sister   .  Colon cancer Neg Hx     Prior to Admission medications   Medication Sig Start Date End Date Taking? Authorizing Provider  furosemide (LASIX) 40 MG tablet TAKE 1 TABLET BY MOUTH  DAILY AS NEEDED 07/15/18  Yes Lockamy, Timothy, DO  lactulose (CHRONULAC) 10 GM/15ML solution Take 45 mLs (30 g total) by mouth 2 (two) times daily. Titrate for 3-4 BM's/ day 03/22/18  Yes Sheikh, Omair Latif, DO  losartan (COZAAR) 25 MG tablet TAKE 1 TABLET BY MOUTH  DAILY  04/30/18  Yes Lockamy, Timothy, DO  metoprolol tartrate (LOPRESSOR) 50 MG tablet TAKE 1 TABLET BY MOUTH TWO  TIMES DAILY 07/15/18  Yes Lockamy, Timothy, DO  omeprazole (PRILOSEC) 20 MG capsule TAKE 1 CAPSULE BY MOUTH  DAILY 07/15/18  Yes Lockamy, Timothy, DO  PROAIR HFA 108 (90 Base) MCG/ACT inhaler USE 2 PUFFS BY MOUTH EVERY  4 HOURS AS NEEDED FOR  WHEEZING OR SHORTNESS OF  BREATH (OR COUGHING) 06/20/18  Yes Nuala Alpha, DO  simvastatin (ZOCOR) 20 MG tablet TAKE 1 TABLET BY MOUTH  DAILY 07/15/18  Yes Nuala Alpha, DO    Physical Exam: Vitals:   07/20/18 1900 07/20/18 1930 07/20/18 2000 07/20/18 2135  BP: 124/90 116/65 127/84 123/70  Pulse: 70 72 69 (!) 59  Resp: 15 15 14 18   Temp:    98 F (36.7 C)  TempSrc:    Oral  SpO2: 97% 94% 91% 94%  Weight:      Height:          Constitutional: Moderately built and nourished. Vitals:   07/20/18 1900 07/20/18 1930 07/20/18 2000 07/20/18 2135  BP: 124/90 116/65 127/84 123/70  Pulse: 70 72 69 (!) 59  Resp: 15 15 14 18   Temp:    98 F (36.7 C)  TempSrc:    Oral  SpO2: 97% 94% 91% 94%  Weight:      Height:       Eyes: Anicteric no pallor. ENMT: No discharge from the ears eyes nose or mouth. Neck: No mass or.  No neck rigidity but no JVD appreciated. Respiratory: No rhonchi or crepitations. Cardiovascular: S1-S2 heard no murmurs appreciated. Abdomen: Distended right upper quadrant tenderness no guarding or rigidity. Musculoskeletal: No edema. Skin: No rash. Neurologic: Alert awake oriented to time place and person.  Moves all extremities. Psychiatric: Appears normal per normal affect.   Labs on Admission: I have personally reviewed following labs and imaging studies  CBC: Recent Labs  Lab 07/20/18 1521  WBC 12.5*  HGB 16.0  HCT 46.1  MCV 97.3  PLT 42*   Basic Metabolic Panel: Recent Labs  Lab 07/20/18 1521  NA 137  K 3.4*  CL 103  CO2 25  GLUCOSE 156*  BUN 9  CREATININE 0.85  CALCIUM 8.6*   GFR: Estimated  Creatinine Clearance: 95.1 mL/min (by C-G formula based on SCr of 0.85 mg/dL). Liver Function Tests: Recent Labs  Lab 07/20/18 1521  AST 51*  ALT 31  ALKPHOS 130*  BILITOT 5.8*  PROT 7.3  ALBUMIN 2.5*   Recent Labs  Lab 07/20/18 1521  LIPASE 25   No results for input(s): AMMONIA in the last 168 hours. Coagulation Profile: No results for input(s): INR, PROTIME in the last 168 hours. Cardiac Enzymes: No results for input(s): CKTOTAL, CKMB, CKMBINDEX, TROPONINI in the last 168 hours. BNP (last 3 results) No results for input(s): PROBNP in the last 8760 hours. HbA1C: No results for input(s): HGBA1C in the last 72 hours. CBG: No results  for input(s): GLUCAP in the last 168 hours. Lipid Profile: No results for input(s): CHOL, HDL, LDLCALC, TRIG, CHOLHDL, LDLDIRECT in the last 72 hours. Thyroid Function Tests: No results for input(s): TSH, T4TOTAL, FREET4, T3FREE, THYROIDAB in the last 72 hours. Anemia Panel: No results for input(s): VITAMINB12, FOLATE, FERRITIN, TIBC, IRON, RETICCTPCT in the last 72 hours. Urine analysis:    Component Value Date/Time   COLORURINE YELLOW 07/20/2018 1521   APPEARANCEUR CLEAR 07/20/2018 1521   LABSPEC 1.034 (H) 07/20/2018 1521   PHURINE 7.0 07/20/2018 1521   GLUCOSEU NEGATIVE 07/20/2018 1521   HGBUR LARGE (A) 07/20/2018 1521   BILIRUBINUR NEGATIVE 07/20/2018 1521   KETONESUR NEGATIVE 07/20/2018 1521   PROTEINUR NEGATIVE 07/20/2018 1521   NITRITE NEGATIVE 07/20/2018 1521   LEUKOCYTESUR NEGATIVE 07/20/2018 1521   Sepsis Labs: @LABRCNTIP (procalcitonin:4,lacticidven:4) )No results found for this or any previous visit (from the past 240 hour(s)).   Radiological Exams on Admission: Ct Abdomen Pelvis W Contrast  Result Date: 07/20/2018 CLINICAL DATA:  Abdominal pain and distension. EXAM: CT ABDOMEN AND PELVIS WITH CONTRAST TECHNIQUE: Multidetector CT imaging of the abdomen and pelvis was performed using the standard protocol following bolus  administration of intravenous contrast. CONTRAST:  164mL ISOVUE-300 IOPAMIDOL (ISOVUE-300) INJECTION 61% COMPARISON:  Noncontrast CT 03/20/2018 FINDINGS: Lower chest: Large paraesophageal varices noted. Hepatobiliary: Liver is heterogeneous with nodular contour consistent with cirrhosis. Gallbladder is markedly distended with multiple calcified stones measuring around 10 mm each. Gallbladder wall is ill-defined with a suggestion of pericholecystic edema. Axial image 21 of series 2 shows an 8 mm stone in the region of the porta hepatis. There is no stone at this location on the previous study and imaging features are suspicious for a cystic duct stone. No intrahepatic or extrahepatic biliary dilation. Pancreas: No focal mass lesion. No dilatation of the main duct. No intraparenchymal cyst. No peripancreatic edema. Spleen: No splenomegaly. No focal mass lesion. Adrenals/Urinary Tract: No adrenal nodule or mass. Tiny low-density cortical lesions in each kidney are too small to characterize but are likely benign. No evidence for hydroureter. The urinary bladder appears normal for the degree of distention. Stomach/Bowel: Stomach is nondistended. No gastric wall thickening. No evidence of outlet obstruction. Duodenum is normally positioned as is the ligament of Treitz. Small bowel loops in the right lower quadrant are fluid-filled mildly distended up to 2.5 cm diameter. The terminal ileum is normal. The appendix is normal. Diverticular changes are noted in the left colon without evidence of diverticulitis. Vascular/Lymphatic: There is abdominal aortic atherosclerosis without aneurysm. Right common iliac artery is dilated up to 2 cm diameter. Portal vein appears enlarged but patent. Superior mesenteric vein and splenic vein are patent. Recanalized paraumbilical vein is prominent. As mentioned above, there are prominent paraesophageal varices. Vascular collateralization in the upper abdomen associated. Upper normal lymph  nodes in the hepato duodenal ligament likely related to underlying liver disease. No pelvic sidewall lymphadenopathy. Reproductive: Prostate gland upper normal with dystrophic calcification in the parenchyma. Other: Tiny volume free fluid noted in the pelvis. There is edema and stranding in the right upper quadrant around the gallbladder and hepatic flexure. Tiny amount of fluid is seen adjacent to the liver. Musculoskeletal: No worrisome lytic or sclerotic osseous abnormality. IMPRESSION: 1. Distended gallbladder with numerous calcified stones in the lumen. Gallbladder wall appears irregular and there is edema/inflammation in the right upper quadrant around the gallbladder, inferior tip of the right liver, and hepatic flexure of the colon. A single stone is identified in the porta hepatis  and is concerning for a cystic duct stone. Constellation of findings suggest acute cholecystitis. If clinical picture is equivocal, abdominal ultrasound or nuclear scintigraphy may prove helpful to further evaluate. 2. Cirrhosis with prominent recanalized paraumbilical vein and large esophageal varices, consistent with associated portal venous hypertension. 3. Tiny volume fluid noted adjacent to the liver and in the anatomic pelvis. Electronically Signed   By: Misty Stanley M.D.   On: 07/20/2018 17:37   US Abdomen Limited Ruq  Result Date: 07/20/2018 CLINICAL DATA:  Epigastric pain. Right upper quadrant pain since this morning. EXAM: ULTRASOUND ABDOMEN LIMITED RIGHT UPPER QUADRANT COMPARISON:  CT abdomen/pelvis earlier this day FINDINGS: Gallbladder: Distended gallbladder spanning 14.7 cm in length. Gallbladder sludge with multiple intraluminal gallstones. The stone in the porta hepatis on CT is not definitively localized sonographically. No gallbladder wall thickening, normal wall thickness of 2 mm. Small volume perihepatic ascites and pericholecystic fluid. No sonographic Murphy sign noted by sonographer. Common bile duct:  Diameter: 4-5 mm. Liver: No focal lesion identified. Nodular hepatic contours with increased echogenicity. Portal vein is patent on color Doppler imaging with normal direction of blood flow towards the liver. IMPRESSION: 1. Distended gallbladder containing multiple intraluminal stones. No wall thickening or sonographic Murphy sign. Stone in the porta hepatis on CT is not localized sonographically. Consider nuclear medicine hepatic biliary scan to evaluate for cystic duct patency. 2. Hepatic cirrhosis. 3. Small amount of ascites in the right upper quadrant adjacent to the gallbladder and liver. Electronically Signed   By: Keith Rake M.D.   On: 07/20/2018 21:02      Assessment/Plan Principal Problem:   Abdominal pain Active Problems:   HTN (hypertension)   CAD s/p CABG 2004   Pacemaker - dual chamber Medtronic 2011   Thrombocytopathia [facility])   Paroxysmal atrial fibrillation ([place])   Cirrhosis ([place])    1. Abdominal pain mostly epigastric and right upper quadrant with CAT scan showing gallstones and sonogram also shows gallstones but no Murphy sign -we will get HIDA scan continue with antibiotics for now.  Follow LFTs.  If HIDA scan is negative we will try to get paracentesis to rule out SBP.  Surgery was notified by ER physician.  On ceftriaxone and Flagyl. 2. Hypertension we will keep patient on PRN hydralazine since patient is going to be n.p.o. 3. Chronic thrombocytopenia likely from cirrhosis we will follow CBC. 4. History of A. fib not on anticoagulation due to thrombocytopenia and previous history of bleeding. 5. CAD status post CABG denies any chest pain or since patient has epigastric pain will check troponin. 6. History of dual-chamber pacemaker placement. 7. History of cirrhosis from hep C and previous history of alcohol abuse. 8. COPD not actively wheezing.   DVT prophylaxis: SCDs.  In anticipation of procedure. Code Status: Full code. Family Communication: Discussed with  patient. Disposition Plan: Home. Consults called: ER physician discussed with general surgery.  If HIDA scan is positive please reconsult surgery. Admission status: Observation.   Rise Patience MD [facility] Pager [phone].  If 7PM-7AM, please contact night-coverage [email or web address] Password [email or web address]  07/20/2018, 10:19 PM

## 2018-07-20 NOTE — ED Provider Notes (Signed)
[facility] Emergency Department Provider Note MRN:  [id number]  [place] date & time: 07/20/18     Chief Complaint   Abdominal pain History of Present Illness   Hunter Carter is a 67 y.o. year-old male with a history of cirrhosis, CAD, CHF presenting to the ED with chief complaint of abdominal pain.  Pain is located in the epigastrium.  Began gradually 3 days ago, progressively worsening.  Associated with decreased bowel movements, nausea, decreased appetite.  Patient feels generally bloated.  Denies fevers, no chest pain or shortness of breath.  Lightheadedness today.  Endorsing black tarry stool today.  Has a history of GI bleeds in the past.  Review of Systems  A complete 10 system review of systems was obtained and all systems are negative except as noted in the HPI and PMH.   Patient's Health History    Past Medical History:  Diagnosis Date  . Acute hepatic encephalopathy 10/10/2015   [name] 10/10/2015  . Arthritis    "joints ache" (10/10/2015)  . Bleeding stomach ulcer    "recently" (10/10/2015)  . CAD (coronary artery disease)   . CHF (congestive heart failure) ([place])   . Cirrhosis ([place])    due to HCV/notes 10/10/2015  . ETOH abuse   . GERD (gastroesophageal reflux disease)   . Heart block AV second degree    permanent pacemaker 02/2004  . Hepatitis C   . Hyperlipidemia   . Inferior MI ([place]) 2005  . Presence of permanent cardiac pacemaker   . RBBB   . Systemic hypertension   . Tubulovillous adenoma of colon   . Ventricular tachycardia (HCC)    nonsustained    Past Surgical History:  Procedure Laterality Date  . CARDIAC CATHETERIZATION  03/06/04   significant 3 vessel disease, totalled graft obtuse marginal  . CORONARY ARTERY BYPASS GRAFT  02/10/03   LIMA to LAD,SVG to first diagonal, SVT to OM1  . ESOPHAGOGASTRODUODENOSCOPY N/A 07/29/2015   Procedure: ESOPHAGOGASTRODUODENOSCOPY (EGD);  Surgeon: Clarene Essex, MD;  Location: [facility] ENDOSCOPY;   Service: Endoscopy;  Laterality: N/A;  . INSERT / REPLACE / REMOVE PACEMAKER  03/2010   Medtronic  . KNEE ARTHROSCOPY Left [phone]  . MEDIASTINAL EXPLORATION  02/10/2003  . NM MYOCAR PERF WALL MOTION  04/05/09   low risk scan-abnormal-mod. perfusion defect basal inferoseptal,basal inferior,mid inferoseptal,mid inferior and apical inferior regions  . TOTAL KNEE ARTHROPLASTY Left   . US ECHOCARDIOGRAPHY  01/20/07   mild MR,mild to mod. TR,trace AI,mild PI    Family History  Problem Relation Age of Onset  . Heart failure Mother   . Liver disease Father   . Heart failure Sister   . Hypertension Sister   . Hypertension Sister   . Colon cancer Neg Hx     Social History   Socioeconomic History  . Marital status: Widowed    Spouse name: Not on file  . Number of children: Not on file  . Years of education: Not on file  . Highest education level: Not on file  Occupational History  . Not on file  Social Needs  . Financial resource strain: Not on file  . Food insecurity:    Worry: Not on file    Inability: Not on file  . Transportation needs:    Medical: Not on file    Non-medical: Not on file  Tobacco Use  . Smoking status: Former Smoker    Packs/day: 0.25    Years: 41.00    Pack years: 10.25  Types: Cigarettes    Last attempt to quit: 11/15/2017    Years since quitting: 0.6  . Smokeless tobacco: Never Used  Substance and Sexual Activity  . Alcohol use: Yes    Comment: 10/10/2015 "last drink was 2-3 months ago; h/o abuse"  . Drug use: Yes    Types: Marijuana    Comment: last use 2015  . Sexual activity: Not on file  Lifestyle  . Physical activity:    Days per week: Not on file    Minutes per session: Not on file  . Stress: Not on file  Relationships  . Social connections:    Talks on phone: Not on file    Gets together: Not on file    Attends religious service: Not on file    Active member of club or organization: Not on file    Attends meetings of clubs or  organizations: Not on file    Relationship status: Not on file  . Intimate partner violence:    Fear of current or ex partner: Not on file    Emotionally abused: Not on file    Physically abused: Not on file    Forced sexual activity: Not on file  Other Topics Concern  . Not on file  Social History Narrative  . Not on file     Physical Exam  Vital Signs and Nursing Notes reviewed Vitals:   07/20/18 2000 07/20/18 2135  BP: 127/84 123/70  Pulse: 69 (!) 59  Resp: 14 18  Temp:  98 F (36.7 C)  SpO2: 91% 94%    CONSTITUTIONAL: Well-appearing, NAD NEURO:  Alert and oriented x 3, no focal deficits EYES:  eyes equal and reactive ENT/NECK:  no LAD, no JVD CARDIO: Regular rate, well-perfused, normal S1 and S2 PULM:  CTAB no wheezing or rhonchi GI/GU:  normal bowel sounds, mildly distended, mild epigastric tenderness to palpation MSK/SPINE:  No gross deformities, no edema SKIN:  no rash, atraumatic PSYCH:  Appropriate speech and behavior  Diagnostic and Interventional Summary    EKG Interpretation  Date/Time:    Ventricular Rate:    PR Interval:    QRS Duration:   QT Interval:    QTC Calculation:   R Axis:     Text Interpretation:        Labs Reviewed  COMPREHENSIVE METABOLIC PANEL - Abnormal; Notable for the following components:      Result Value   Potassium 3.4 (*)    Glucose, Bld 156 (*)    Calcium 8.6 (*)    Albumin 2.5 (*)    AST 51 (*)    Alkaline Phosphatase 130 (*)    Total Bilirubin 5.8 (*)    All other components within normal limits  CBC - Abnormal; Notable for the following components:   WBC 12.5 (*)    Platelets 42 (*)    All other components within normal limits  URINALYSIS, ROUTINE W REFLEX MICROSCOPIC - Abnormal; Notable for the following components:   Specific Gravity, Urine 1.034 (*)    Hgb urine dipstick LARGE (*)    All other components within normal limits  LIPASE, BLOOD    US Abdomen Limited RUQ  Final Result    CT ABDOMEN PELVIS W  CONTRAST  Final Result      Medications  iopamidol (ISOVUE-300) 61 % injection (  Hold 07/20/18 1733)  sodium chloride 0.9 % injection (0 mLs  Hold 07/20/18 1733)  metroNIDAZOLE (FLAGYL) IVPB 500 mg (has no administration in time range)  sodium chloride 0.9 % bolus 1,000 mL (0 mLs Intravenous Stopped 07/20/18 1907)  famotidine (PEPCID) IVPB 20 mg premix (0 mg Intravenous Stopped 07/20/18 1839)  gi cocktail (Maalox,Lidocaine,Donnatal) (30 mLs Oral Given 07/20/18 1739)  iopamidol (ISOVUE-300) 61 % injection 100 mL (100 mLs Intravenous Contrast Given 07/20/18 1714)  cefTRIAXone (ROCEPHIN) 2 g in sodium chloride 0.9 % 100 mL IVPB (0 g Intravenous Stopped 07/20/18 2049)     Procedures Critical Care  ED Course and Medical Decision Making  I have reviewed the triage vital signs and the nursing notes.  Pertinent labs & imaging results that were available during my care of the patient were reviewed by me and considered in my medical decision making (see below for details).  Question of upper GI bleed in this 67 year old male with history of cirrhosis.  No hematemesis.  Also considering bowel obstruction.  Will CT for further clarification.  Concern for cholecystitis on CT scan.  Discussed with general surgery, who prefers ultrasound for further clarification and medicine admission.  Accepted for admission by hospitalist service.  Given IV ceftriaxone and Flagyl in the ED.  Barth Kirks. [place] Small, [place] [email or web address]  Final Clinical Impressions(s) / ED Diagnoses     ICD-10-CM   1. Epigastric pain R10.13 US Abdomen Limited RUQ    US Abdomen Limited RUQ    CANCELED: US Abdomen Limited RUQ    CANCELED: US Abdomen Limited RUQ    ED Discharge Orders    None         Maudie Flakes, MD 07/20/18 2219

## 2018-07-21 ENCOUNTER — Observation Stay (HOSPITAL_COMMUNITY): Payer: Medicare Other

## 2018-07-21 DIAGNOSIS — Z96652 Presence of left artificial knee joint: Secondary | ICD-10-CM | POA: Diagnosis present

## 2018-07-21 DIAGNOSIS — Z8711 Personal history of peptic ulcer disease: Secondary | ICD-10-CM | POA: Diagnosis not present

## 2018-07-21 DIAGNOSIS — Z8249 Family history of ischemic heart disease and other diseases of the circulatory system: Secondary | ICD-10-CM | POA: Diagnosis not present

## 2018-07-21 DIAGNOSIS — Z87891 Personal history of nicotine dependence: Secondary | ICD-10-CM | POA: Diagnosis not present

## 2018-07-21 DIAGNOSIS — R1084 Generalized abdominal pain: Secondary | ICD-10-CM | POA: Diagnosis not present

## 2018-07-21 DIAGNOSIS — R1013 Epigastric pain: Secondary | ICD-10-CM

## 2018-07-21 DIAGNOSIS — I10 Essential (primary) hypertension: Secondary | ICD-10-CM | POA: Diagnosis not present

## 2018-07-21 DIAGNOSIS — I25118 Atherosclerotic heart disease of native coronary artery with other forms of angina pectoris: Secondary | ICD-10-CM

## 2018-07-21 DIAGNOSIS — K828 Other specified diseases of gallbladder: Secondary | ICD-10-CM | POA: Diagnosis present

## 2018-07-21 DIAGNOSIS — K7469 Other cirrhosis of liver: Secondary | ICD-10-CM | POA: Diagnosis not present

## 2018-07-21 DIAGNOSIS — Z86010 Personal history of colonic polyps: Secondary | ICD-10-CM | POA: Diagnosis not present

## 2018-07-21 DIAGNOSIS — R1011 Right upper quadrant pain: Secondary | ICD-10-CM | POA: Diagnosis not present

## 2018-07-21 DIAGNOSIS — Z951 Presence of aortocoronary bypass graft: Secondary | ICD-10-CM | POA: Diagnosis not present

## 2018-07-21 DIAGNOSIS — E785 Hyperlipidemia, unspecified: Secondary | ICD-10-CM | POA: Diagnosis present

## 2018-07-21 DIAGNOSIS — I110 Hypertensive heart disease with heart failure: Secondary | ICD-10-CM | POA: Diagnosis not present

## 2018-07-21 DIAGNOSIS — I2510 Atherosclerotic heart disease of native coronary artery without angina pectoris: Secondary | ICD-10-CM | POA: Diagnosis not present

## 2018-07-21 DIAGNOSIS — I480 Paroxysmal atrial fibrillation: Secondary | ICD-10-CM | POA: Diagnosis present

## 2018-07-21 DIAGNOSIS — R188 Other ascites: Secondary | ICD-10-CM | POA: Diagnosis not present

## 2018-07-21 DIAGNOSIS — D691 Qualitative platelet defects: Secondary | ICD-10-CM

## 2018-07-21 DIAGNOSIS — K8020 Calculus of gallbladder without cholecystitis without obstruction: Secondary | ICD-10-CM | POA: Diagnosis not present

## 2018-07-21 DIAGNOSIS — K219 Gastro-esophageal reflux disease without esophagitis: Secondary | ICD-10-CM | POA: Diagnosis present

## 2018-07-21 DIAGNOSIS — B1920 Unspecified viral hepatitis C without hepatic coma: Secondary | ICD-10-CM | POA: Diagnosis present

## 2018-07-21 DIAGNOSIS — I5032 Chronic diastolic (congestive) heart failure: Secondary | ICD-10-CM | POA: Diagnosis not present

## 2018-07-21 DIAGNOSIS — M1990 Unspecified osteoarthritis, unspecified site: Secondary | ICD-10-CM | POA: Diagnosis present

## 2018-07-21 DIAGNOSIS — J449 Chronic obstructive pulmonary disease, unspecified: Secondary | ICD-10-CM | POA: Diagnosis present

## 2018-07-21 DIAGNOSIS — I252 Old myocardial infarction: Secondary | ICD-10-CM | POA: Diagnosis not present

## 2018-07-21 DIAGNOSIS — Z950 Presence of cardiac pacemaker: Secondary | ICD-10-CM

## 2018-07-21 DIAGNOSIS — Z79899 Other long term (current) drug therapy: Secondary | ICD-10-CM | POA: Diagnosis not present

## 2018-07-21 DIAGNOSIS — K8000 Calculus of gallbladder with acute cholecystitis without obstruction: Secondary | ICD-10-CM | POA: Diagnosis not present

## 2018-07-21 DIAGNOSIS — D6959 Other secondary thrombocytopenia: Secondary | ICD-10-CM | POA: Diagnosis present

## 2018-07-21 DIAGNOSIS — Z9889 Other specified postprocedural states: Secondary | ICD-10-CM | POA: Diagnosis not present

## 2018-07-21 DIAGNOSIS — K7030 Alcoholic cirrhosis of liver without ascites: Secondary | ICD-10-CM | POA: Diagnosis present

## 2018-07-21 LAB — BASIC METABOLIC PANEL
Anion gap: 7 (ref 5–15)
BUN: 12 mg/dL (ref 8–23)
CHLORIDE: 103 mmol/L (ref 98–111)
CO2: 26 mmol/L (ref 22–32)
Calcium: 7.8 mg/dL — ABNORMAL LOW (ref 8.9–10.3)
Creatinine, Ser: 0.98 mg/dL (ref 0.61–1.24)
Glucose, Bld: 128 mg/dL — ABNORMAL HIGH (ref 70–99)
POTASSIUM: 3.4 mmol/L — AB (ref 3.5–5.1)
SODIUM: 136 mmol/L (ref 135–145)

## 2018-07-21 LAB — CBC WITH DIFFERENTIAL/PLATELET
BASOS ABS: 0 10*3/uL (ref 0.0–0.1)
Basophils Relative: 0 %
EOS PCT: 1 %
Eosinophils Absolute: 0.1 10*3/uL (ref 0.0–0.7)
HEMATOCRIT: 41 % (ref 39.0–52.0)
Hemoglobin: 14 g/dL (ref 13.0–17.0)
LYMPHS ABS: 1.1 10*3/uL (ref 0.7–4.0)
LYMPHS PCT: 9 %
MCH: 33.3 pg (ref 26.0–34.0)
MCHC: 34.1 g/dL (ref 30.0–36.0)
MCV: 97.6 fL (ref 78.0–100.0)
Monocytes Absolute: 2.2 10*3/uL — ABNORMAL HIGH (ref 0.1–1.0)
Monocytes Relative: 17 %
NEUTROS ABS: 9.1 10*3/uL — AB (ref 1.7–7.7)
Neutrophils Relative %: 73 %
Platelets: 42 10*3/uL — ABNORMAL LOW (ref 150–400)
RBC: 4.2 MIL/uL — ABNORMAL LOW (ref 4.22–5.81)
RDW: 14.1 % (ref 11.5–15.5)
WBC: 12.5 10*3/uL — ABNORMAL HIGH (ref 4.0–10.5)

## 2018-07-21 LAB — CBC
HEMATOCRIT: 42.9 % (ref 39.0–52.0)
HEMOGLOBIN: 14.7 g/dL (ref 13.0–17.0)
MCH: 33.7 pg (ref 26.0–34.0)
MCHC: 34.3 g/dL (ref 30.0–36.0)
MCV: 98.4 fL (ref 78.0–100.0)
Platelets: 40 10*3/uL — ABNORMAL LOW (ref 150–400)
RBC: 4.36 MIL/uL (ref 4.22–5.81)
RDW: 14 % (ref 11.5–15.5)
WBC: 12.5 10*3/uL — AB (ref 4.0–10.5)

## 2018-07-21 LAB — HEPATIC FUNCTION PANEL
ALBUMIN: 2 g/dL — AB (ref 3.5–5.0)
ALK PHOS: 109 U/L (ref 38–126)
ALT: 25 U/L (ref 0–44)
AST: 40 U/L (ref 15–41)
BILIRUBIN INDIRECT: 2.7 mg/dL — AB (ref 0.3–0.9)
Bilirubin, Direct: 1.2 mg/dL — ABNORMAL HIGH (ref 0.0–0.2)
TOTAL PROTEIN: 6.1 g/dL — AB (ref 6.5–8.1)
Total Bilirubin: 3.9 mg/dL — ABNORMAL HIGH (ref 0.3–1.2)

## 2018-07-21 LAB — TROPONIN I: Troponin I: 0.03 ng/mL

## 2018-07-21 LAB — TYPE AND SCREEN
ABO/RH(D): A POS
Antibody Screen: NEGATIVE

## 2018-07-21 LAB — ABO/RH: ABO/RH(D): A POS

## 2018-07-21 MED ORDER — MORPHINE SULFATE (PF) 4 MG/ML IV SOLN
3.9500 mg | Freq: Once | INTRAVENOUS | Status: AC
  Administered 2018-07-21: 3.95 mg via INTRAVENOUS

## 2018-07-21 MED ORDER — MORPHINE SULFATE (PF) 4 MG/ML IV SOLN
INTRAVENOUS | Status: AC
  Administered 2018-07-21: 3.95 mg via INTRAVENOUS
  Filled 2018-07-21: qty 1

## 2018-07-21 MED ORDER — HYDRALAZINE HCL 20 MG/ML IJ SOLN: 5.0000 mg | INTRAMUSCULAR | Status: DC | PRN

## 2018-07-21 MED ORDER — TECHNETIUM TC 99M MEBROFENIN IV KIT
7.4000 | PACK | Freq: Once | INTRAVENOUS | Status: AC | PRN
  Administered 2018-07-21: 7.4 via INTRAVENOUS

## 2018-07-21 NOTE — Progress Notes (Addendum)
PROGRESS NOTE  Hunter Carter  GGE:[id number] DOB: December 22, 1950 DOA: 07/20/2018 PCP: Nuala Alpha, DO   Brief Narrative: Hunter Carter is a 67 y.o. male with a history of hepatic cirrhosis complicated by esophageal varices, leukopenia and thrombocytopenia, history of alcohol abuse and treated hepatitis C, HTN, CAD s/p CABG, COPD, and PAF who presented with 3 days of worsening abdominal swelling and pain worst in the RUQ with an episode of dry heaving. He was afebrile with WBC 12.5, distended abdomen with CT abdomen showing a distended, edematous gallbladder with cholelithiasis on a background of changes consistent with portal hypertension and "tiny volume fluid" around the liver and in pelvis. There was concern for stone in the cystic duct. U/S confirmed distension with cholelithiasis but no wall thickening or Murphy's sign. HIDA is ordered and pending. Empiric antibiotics, ceftriaxone and flagyl, were started and surgery consulted.   Assessment & Plan: Principal Problem:   Abdominal pain Active Problems:   HTN (hypertension)   CAD s/p CABG 2004   Pacemaker - dual chamber Medtronic 2011   Thrombocytopathia [facility])   Paroxysmal atrial fibrillation (HCC)   Cirrhosis ([place])  Abdominal pain and distention: Radiologic evidence of biliary etiology though not conclusive. SBP also in DDx.  - HIDA pending - Continue empiric ceftriaxone, flagyl.  - NPO - Surgery consulted - Consider Dx and Tx paracentesis if work up above is negative.   Hepatic cirrhosis: Multifactorial due to alcohol abuse and hepatitis C which per pt has been treated. Noted on CT with esophageal varices. Bilirubin elevated at baseline.  - Restart home lactulose when taking po, mentating at baseline. Consider restart of lasix pending clinical course and addition of spironolactone if tolerable.  - Check INR (baseline appears to be 1.6 - 1.7).  Thrombocytopenia: Stable, due to cirrhosis.  - Monitor  CAD s/p CABG: No typical  anginal symptoms currently. Doesn't follow regularly with cardiology. No ischemic signs on ECG, troponin 0.03 which has been baseline, not consistent with ACS.  - Continue beta blocker, statin (benefit > risk with CAD despite cirrhosis with stable LFTs) when taking po as BP tolerates.   PAF: Rate controlled, not on anticoagulation due to thrombocytopenia.  - Monitor vital signs per protocol.  - Continue metoprolol when taking po.  HTN:  - BP currently 109/59, so will hold lasix, metoprolol, losartan. Will monitor and add back as able.  - prn hydralazine  History of dual-chamber pacemaker placement.  COPD: No exacerbation - Prns  DVT prophylaxis: SCDs Code Status: Full Family Communication: None at bedside Disposition Plan: Continued work up. Pending HIDA scan. Given that he remains NPO pending further work up and may require surgery and ongoing IV antibiotics, will change to inpatient. Due to comorbidities including decompensated liver failure, he is at high risk of decompensation. Will additionally discuss with the [facility] at [facility] to consider transfer for continuity.  Consultants:   General surgery  Procedures:   None  Antimicrobials:  Ceftriaxone 10/6 >>   Flagyl 10/6 >>    Subjective: Abdominal pain is stable, severe with distention in his abdomen pretty acutely, last few days. He had emesis yesterday but is hungry today.  Objective: Vitals:   07/20/18 2000 07/20/18 2135 07/21/18 0425 07/21/18 0553  BP: 127/84 123/70  (!) 109/56  Pulse: 69 (!) 59  69  Resp: 14 18  18   Temp:  98 F (36.7 C)  98 F (36.7 C)  TempSrc:  Oral  Oral  SpO2: 91% 94%  94%  Weight:  98.7 kg   Height:        Intake/Output Summary (Last 24 hours) at 07/21/2018 1221 Last data filed at 07/21/2018 1021 Gross per 24 hour  Intake 1712.36 ml  Output 100 ml  Net 1612.36 ml   Filed Weights   07/20/18 1515 07/21/18 0425  Weight: 98.4 kg 98.7 kg    Gen: 67 y.o. male in no distress Pulm:  Non-labored breathing. Clear to auscultation bilaterally.  CV: Regular rate and rhythm. No murmur, rub, or gallop. No JVD, Trace pedal edema. GI: Abdomen distended with tenderness in epigastrium and RUQ. +Murphy's on my exam. Normoactive bowel sounds.   Ext: Warm, no deformities Skin: No jaundice, rashes, lesions or ulcers Neuro: Alert and oriented. No asterixis. No focal neurological deficits. Psych: Judgement and insight appear normal. Mood & affect appropriate.   Data Reviewed: I have personally reviewed following labs and imaging studies  CBC: Recent Labs  Lab 07/20/18 1521 07/20/18 2342 07/21/18 0352  WBC 12.5* 12.5* 12.5*  NEUTROABS  --   --  9.1*  HGB 16.0 14.7 14.0  HCT 46.1 42.9 41.0  MCV 97.3 98.4 97.6  PLT 42* 40* 42*   Basic Metabolic Panel: Recent Labs  Lab 07/20/18 1521 07/21/18 0352  NA 137 136  K 3.4* 3.4*  CL 103 103  CO2 25 26  GLUCOSE 156* 128*  BUN 9 12  CREATININE 0.85 0.98  CALCIUM 8.6* 7.8*   GFR: Estimated Creatinine Clearance: 82.7 mL/min (by C-G formula based on SCr of 0.98 mg/dL). Liver Function Tests: Recent Labs  Lab 07/20/18 1521 07/21/18 0352  AST 51* 40  ALT 31 25  ALKPHOS 130* 109  BILITOT 5.8* 3.9*  PROT 7.3 6.1*  ALBUMIN 2.5* 2.0*   Recent Labs  Lab 07/20/18 1521  LIPASE 25   No results for input(s): AMMONIA in the last 168 hours. Coagulation Profile: No results for input(s): INR, PROTIME in the last 168 hours. Cardiac Enzymes: Recent Labs  Lab 07/21/18 0720  TROPONINI 0.03*   BNP (last 3 results) No results for input(s): PROBNP in the last 8760 hours. HbA1C: No results for input(s): HGBA1C in the last 72 hours. CBG: No results for input(s): GLUCAP in the last 168 hours. Lipid Profile: No results for input(s): CHOL, HDL, LDLCALC, TRIG, CHOLHDL, LDLDIRECT in the last 72 hours. Thyroid Function Tests: No results for input(s): TSH, T4TOTAL, FREET4, T3FREE, THYROIDAB in the last 72 hours. Anemia Panel: No  results for input(s): VITAMINB12, FOLATE, FERRITIN, TIBC, IRON, RETICCTPCT in the last 72 hours. Urine analysis:    Component Value Date/Time   COLORURINE YELLOW 07/20/2018 1521   APPEARANCEUR CLEAR 07/20/2018 1521   LABSPEC 1.034 (H) 07/20/2018 1521   PHURINE 7.0 07/20/2018 1521   GLUCOSEU NEGATIVE 07/20/2018 1521   HGBUR LARGE (A) 07/20/2018 1521   BILIRUBINUR NEGATIVE 07/20/2018 1521   KETONESUR NEGATIVE 07/20/2018 1521   PROTEINUR NEGATIVE 07/20/2018 1521   NITRITE NEGATIVE 07/20/2018 1521   LEUKOCYTESUR NEGATIVE 07/20/2018 1521   No results found for this or any previous visit (from the past 240 hour(s)).    Radiology Studies: Ct Abdomen Pelvis W Contrast  Result Date: 07/20/2018 CLINICAL DATA:  Abdominal pain and distension. EXAM: CT ABDOMEN AND PELVIS WITH CONTRAST TECHNIQUE: Multidetector CT imaging of the abdomen and pelvis was performed using the standard protocol following bolus administration of intravenous contrast. CONTRAST:  188mL ISOVUE-300 IOPAMIDOL (ISOVUE-300) INJECTION 61% COMPARISON:  Noncontrast CT 03/20/2018 FINDINGS: Lower chest: Large paraesophageal varices noted. Hepatobiliary: Liver is heterogeneous with  nodular contour consistent with cirrhosis. Gallbladder is markedly distended with multiple calcified stones measuring around 10 mm each. Gallbladder wall is ill-defined with a suggestion of pericholecystic edema. Axial image 21 of series 2 shows an 8 mm stone in the region of the porta hepatis. There is no stone at this location on the previous study and imaging features are suspicious for a cystic duct stone. No intrahepatic or extrahepatic biliary dilation. Pancreas: No focal mass lesion. No dilatation of the main duct. No intraparenchymal cyst. No peripancreatic edema. Spleen: No splenomegaly. No focal mass lesion. Adrenals/Urinary Tract: No adrenal nodule or mass. Tiny low-density cortical lesions in each kidney are too small to characterize but are likely benign.  No evidence for hydroureter. The urinary bladder appears normal for the degree of distention. Stomach/Bowel: Stomach is nondistended. No gastric wall thickening. No evidence of outlet obstruction. Duodenum is normally positioned as is the ligament of Treitz. Small bowel loops in the right lower quadrant are fluid-filled mildly distended up to 2.5 cm diameter. The terminal ileum is normal. The appendix is normal. Diverticular changes are noted in the left colon without evidence of diverticulitis. Vascular/Lymphatic: There is abdominal aortic atherosclerosis without aneurysm. Right common iliac artery is dilated up to 2 cm diameter. Portal vein appears enlarged but patent. Superior mesenteric vein and splenic vein are patent. Recanalized paraumbilical vein is prominent. As mentioned above, there are prominent paraesophageal varices. Vascular collateralization in the upper abdomen associated. Upper normal lymph nodes in the hepato duodenal ligament likely related to underlying liver disease. No pelvic sidewall lymphadenopathy. Reproductive: Prostate gland upper normal with dystrophic calcification in the parenchyma. Other: Tiny volume free fluid noted in the pelvis. There is edema and stranding in the right upper quadrant around the gallbladder and hepatic flexure. Tiny amount of fluid is seen adjacent to the liver. Musculoskeletal: No worrisome lytic or sclerotic osseous abnormality. IMPRESSION: 1. Distended gallbladder with numerous calcified stones in the lumen. Gallbladder wall appears irregular and there is edema/inflammation in the right upper quadrant around the gallbladder, inferior tip of the right liver, and hepatic flexure of the colon. A single stone is identified in the porta hepatis and is concerning for a cystic duct stone. Constellation of findings suggest acute cholecystitis. If clinical picture is equivocal, abdominal ultrasound or nuclear scintigraphy may prove helpful to further evaluate. 2.  Cirrhosis with prominent recanalized paraumbilical vein and large esophageal varices, consistent with associated portal venous hypertension. 3. Tiny volume fluid noted adjacent to the liver and in the anatomic pelvis. Electronically Signed   By: Misty Stanley M.D.   On: 07/20/2018 17:37   US Abdomen Limited Ruq  Result Date: 07/20/2018 CLINICAL DATA:  Epigastric pain. Right upper quadrant pain since this morning. EXAM: ULTRASOUND ABDOMEN LIMITED RIGHT UPPER QUADRANT COMPARISON:  CT abdomen/pelvis earlier this day FINDINGS: Gallbladder: Distended gallbladder spanning 14.7 cm in length. Gallbladder sludge with multiple intraluminal gallstones. The stone in the porta hepatis on CT is not definitively localized sonographically. No gallbladder wall thickening, normal wall thickness of 2 mm. Small volume perihepatic ascites and pericholecystic fluid. No sonographic Murphy sign noted by sonographer. Common bile duct: Diameter: 4-5 mm. Liver: No focal lesion identified. Nodular hepatic contours with increased echogenicity. Portal vein is patent on color Doppler imaging with normal direction of blood flow towards the liver. IMPRESSION: 1. Distended gallbladder containing multiple intraluminal stones. No wall thickening or sonographic Murphy sign. Stone in the porta hepatis on CT is not localized sonographically. Consider nuclear medicine hepatic biliary scan  to evaluate for cystic duct patency. 2. Hepatic cirrhosis. 3. Small amount of ascites in the right upper quadrant adjacent to the gallbladder and liver. Electronically Signed   By: Keith Rake M.D.   On: 07/20/2018 21:02    Scheduled Meds: Continuous Infusions: . sodium chloride Stopped (07/21/18 0543)  . cefTRIAXone (ROCEPHIN)  IV 2 g (07/21/18 0918)  . metronidazole 100 mL/hr at 07/21/18 0547     LOS: 0 days   Time spent: 25 minutes.  Patrecia Pour, MD [facility] [email or web address] Password [email or web address] 07/21/2018, 12:21 PM

## 2018-07-21 NOTE — Progress Notes (Signed)
Critical lab value troponin 0.03 MD paged awaiting response.

## 2018-07-21 NOTE — Progress Notes (Signed)
Patient ID: Hunter Carter, male   DOB: 1951/01/14, 67 y.o.   MRN: [id number] Patient's HIDA scan has been completed revealing cystic duct occlusion.  However, given his cirrhotic disease, this could also be a false positive for cholecystitis.  The patient's cirrhosis status, Child's C, has an 80% chance of mortality with an intra-abdominal operation.  He has numerous other risk factors that relate to his cirrhosis such as thrombocytopenia, esophageal varices, ascites, etc that put him at high risk for surgical intervention.  We will plan to NOT pursue operative intervention and treat medically with abx therapy.  We will continue to follow.  Henreitta Cea 4:10 PM 07/21/2018

## 2018-07-21 NOTE — Consult Note (Signed)
Reason for Consult:abd pain, nausea and vomiting Referring Physician: Dr Elijiah Mickley is an 67 y.o. male.  HPI: 67 y.o. M with ESLD due to hep C and etoh, also with h/o PAF, CAD, COPD who presents to the ED with worsening mid-epigastric abd pain over the last 2-3 days.  He endorses nausea and 1 episode of vomiting.  He c/o abd distention as well.  He has had similar pain for the last few months after eating fatty foods.  Past Medical History:  Diagnosis Date  . Acute hepatic encephalopathy 10/10/2015   [name] 10/10/2015  . Arthritis    "joints ache" (10/10/2015)  . Bleeding stomach ulcer    "recently" (10/10/2015)  . CAD (coronary artery disease)   . CHF (congestive heart failure) ([place])   . Cirrhosis ([place])    due to HCV/notes 10/10/2015  . ETOH abuse   . GERD (gastroesophageal reflux disease)   . Heart block AV second degree    permanent pacemaker 02/2004  . Hepatitis C   . Hyperlipidemia   . Inferior MI ([place]) 2005  . Presence of permanent cardiac pacemaker   . RBBB   . Systemic hypertension   . Tubulovillous adenoma of colon   . Ventricular tachycardia (HCC)    nonsustained    Past Surgical History:  Procedure Laterality Date  . CARDIAC CATHETERIZATION  03/06/04   significant 3 vessel disease, totalled graft obtuse marginal  . CORONARY ARTERY BYPASS GRAFT  02/10/03   LIMA to LAD,SVG to first diagonal, SVT to OM1  . ESOPHAGOGASTRODUODENOSCOPY N/A 07/29/2015   Procedure: ESOPHAGOGASTRODUODENOSCOPY (EGD);  Surgeon: Clarene Essex, MD;  Location: [facility] ENDOSCOPY;  Service: Endoscopy;  Laterality: N/A;  . INSERT / REPLACE / REMOVE PACEMAKER  03/2010   [employer]  . KNEE ARTHROSCOPY Left [phone]  . MEDIASTINAL EXPLORATION  02/10/2003  . NM MYOCAR PERF WALL MOTION  04/05/09   low risk scan-abnormal-mod. perfusion defect basal inferoseptal,basal inferior,mid inferoseptal,mid inferior and apical inferior regions  . TOTAL KNEE ARTHROPLASTY Left   . US ECHOCARDIOGRAPHY   01/20/07   mild MR,mild to mod. TR,trace AI,mild PI    Family History  Problem Relation Age of Onset  . Heart failure Mother   . Liver disease Father   . Heart failure Sister   . Hypertension Sister   . Hypertension Sister   . Colon cancer Neg Hx     Social History:  reports that he quit smoking about 8 months ago. His smoking use included cigarettes. He has a 10.25 pack-year smoking history. He has never used smokeless tobacco. He reports that he drinks alcohol. He reports that he has current or past drug history. Drug: Marijuana.  Allergies: No Known Allergies  Medications: I have reviewed the patient's current medications.  Results for orders placed or performed during the hospital encounter of 07/20/18 (from the past 48 hour(s))  Lipase, blood     Status: None   Collection Time: 07/20/18  3:21 PM  Result Value Ref Range   Lipase 25 11 - 51 U/L    Comment: Performed at [facility], [place] [address] [place], [place] [address]  Comprehensive metabolic panel     Status: Abnormal   Collection Time: 07/20/18  3:21 PM  Result Value Ref Range   Sodium 137 135 - 145 mmol/L   Potassium 3.4 (L) 3.5 - 5.1 mmol/L   Chloride 103 98 - 111 mmol/L   CO2 25 22 - 32 mmol/L   Glucose, Bld 156 (H)  70 - 99 mg/dL   BUN 9 8 - 23 mg/dL   Creatinine, Ser 0.85 0.61 - 1.24 mg/dL   Calcium 8.6 (L) 8.9 - 10.3 mg/dL   Total Protein 7.3 6.5 - 8.1 g/dL   Albumin 2.5 (L) 3.5 - 5.0 g/dL   AST 51 (H) 15 - 41 U/L   ALT 31 0 - 44 U/L   Alkaline Phosphatase 130 (H) 38 - 126 U/L   Total Bilirubin 5.8 (H) 0.3 - 1.2 mg/dL   GFR calc non Af Amer >60 >60 mL/min   GFR calc Af Amer >60 >60 mL/min    Comment: (NOTE) The eGFR has been calculated using the CKD EPI equation. This calculation has not been validated in all clinical situations. eGFR's persistently <60 mL/min signify possible Chronic Kidney Disease.    Anion gap 9 5 - 15    Comment: Performed at [facility],  [place] [address], [place], [place] [address]  CBC     Status: Abnormal   Collection Time: 07/20/18  3:21 PM  Result Value Ref Range   WBC 12.5 (H) 4.0 - 10.5 K/uL   RBC 4.74 4.22 - 5.81 MIL/uL   Hemoglobin 16.0 13.0 - 17.0 g/dL   HCT 46.1 39.0 - 52.0 %   MCV 97.3 78.0 - 100.0 fL   MCH 33.8 26.0 - 34.0 pg   MCHC 34.7 30.0 - 36.0 g/dL   RDW 13.9 11.5 - 15.5 %   Platelets 42 (L) 150 - 400 K/uL    Comment: RESULT REPEATED AND VERIFIED SPECIMEN CHECKED FOR CLOTS CONSISTENT WITH PREVIOUS RESULT Performed at [place] [address], [place], [place] [address]   Urinalysis, Routine w reflex microscopic     Status: Abnormal   Collection Time: 07/20/18  3:21 PM  Result Value Ref Range   Color, Urine YELLOW YELLOW   APPearance CLEAR CLEAR   Specific Gravity, Urine 1.034 (H) 1.005 - 1.030   pH 7.0 5.0 - 8.0   Glucose, UA NEGATIVE NEGATIVE mg/dL   Hgb urine dipstick LARGE (A) NEGATIVE   Bilirubin Urine NEGATIVE NEGATIVE   Ketones, ur NEGATIVE NEGATIVE mg/dL   Protein, ur NEGATIVE NEGATIVE mg/dL   Nitrite NEGATIVE NEGATIVE   Leukocytes, UA NEGATIVE NEGATIVE   RBC / HPF 11-20 0 - 5 RBC/hpf   WBC, UA 0-5 0 - 5 WBC/hpf   Bacteria, UA NONE SEEN NONE SEEN   Squamous Epithelial / LPF 0-5 0 - 5    Comment: Performed at [facility], [place] [address] [place], [place] [address]  ABO/Rh     Status: None   Collection Time: 07/20/18 11:36 PM  Result Value Ref Range   ABO/RH(D)      A POS Performed at [facility], [place] [address] [place], [place] [address]   Type and screen [place]     Status: None   Collection Time: 07/20/18 11:42 PM  Result Value Ref Range   ABO/RH(D) A POS    Antibody Screen NEG    Sample Expiration      07/23/2018 Performed at [facility], [place] [address], [place], [place] [address]   CBC     Status: Abnormal   Collection Time: 07/20/18 11:42 PM  Result Value Ref  Range   WBC 12.5 (H) 4.0 - 10.5 K/uL   RBC 4.36 4.22 - 5.81 MIL/uL   Hemoglobin 14.7 13.0 - 17.0 g/dL   HCT 42.9 39.0 -  52.0 %   MCV 98.4 78.0 - 100.0 fL   MCH 33.7 26.0 - 34.0 pg   MCHC 34.3 30.0 - 36.0 g/dL   RDW 14.0 11.5 - 15.5 %   Platelets 40 (L) 150 - 400 K/uL    Comment: SPECIMEN CHECKED FOR CLOTS REPEATED TO VERIFY PLATELET COUNT CONFIRMED BY SMEAR CONSISTENT WITH PREVIOUS RESULT Performed at [facility], [place] [address], [place], [place] [address]   Basic metabolic panel     Status: Abnormal   Collection Time: 07/21/18  3:52 AM  Result Value Ref Range   Sodium 136 135 - 145 mmol/L   Potassium 3.4 (L) 3.5 - 5.1 mmol/L   Chloride 103 98 - 111 mmol/L   CO2 26 22 - 32 mmol/L   Glucose, Bld 128 (H) 70 - 99 mg/dL   BUN 12 8 - 23 mg/dL   Creatinine, Ser 0.98 0.61 - 1.24 mg/dL   Calcium 7.8 (L) 8.9 - 10.3 mg/dL   GFR calc non Af Amer >60 >60 mL/min   GFR calc Af Amer >60 >60 mL/min    Comment: (NOTE) The eGFR has been calculated using the CKD EPI equation. This calculation has not been validated in all clinical situations. eGFR's persistently <60 mL/min signify possible Chronic Kidney Disease.    Anion gap 7 5 - 15    Comment: Performed at [facility], [place] [address], [place], [place] [address]  Hepatic function panel     Status: Abnormal   Collection Time: 07/21/18  3:52 AM  Result Value Ref Range   Total Protein 6.1 (L) 6.5 - 8.1 g/dL   Albumin 2.0 (L) 3.5 - 5.0 g/dL   AST 40 15 - 41 U/L   ALT 25 0 - 44 U/L   Alkaline Phosphatase 109 38 - 126 U/L   Total Bilirubin 3.9 (H) 0.3 - 1.2 mg/dL   Bilirubin, Direct 1.2 (H) 0.0 - 0.2 mg/dL   Indirect Bilirubin 2.7 (H) 0.3 - 0.9 mg/dL    Comment: Performed at [facility], [place] [address] [place], [place] [address]  CBC WITH DIFFERENTIAL     Status: Abnormal   Collection Time: 07/21/18  3:52 AM  Result Value Ref Range   WBC 12.5 (H) 4.0 - 10.5 K/uL   RBC 4.20 (L)  4.22 - 5.81 MIL/uL   Hemoglobin 14.0 13.0 - 17.0 g/dL   HCT 41.0 39.0 - 52.0 %   MCV 97.6 78.0 - 100.0 fL   MCH 33.3 26.0 - 34.0 pg   MCHC 34.1 30.0 - 36.0 g/dL   RDW 14.1 11.5 - 15.5 %   Platelets 42 (L) 150 - 400 K/uL    Comment: SPECIMEN CHECKED FOR CLOTS REPEATED TO VERIFY CONSISTENT WITH PREVIOUS RESULT PLATELET COUNT CONFIRMED BY SMEAR    Neutrophils Relative % 73 %   Neutro Abs 9.1 (H) 1.7 - 7.7 K/uL   Lymphocytes Relative 9 %   Lymphs Abs 1.1 0.7 - 4.0 K/uL   Monocytes Relative 17 %   Monocytes Absolute 2.2 (H) 0.1 - 1.0 K/uL   Eosinophils Relative 1 %   Eosinophils Absolute 0.1 0.0 - 0.7 K/uL   Basophils Relative 0 %   Basophils Absolute 0.0 0.0 - 0.1 K/uL    Comment: Performed at [facility], [place] [address] [place],  [address]    Ct Abdomen Pelvis W Contrast  Result Date: 07/20/2018 CLINICAL DATA:  Abdominal pain and distension. EXAM: CT ABDOMEN AND  PELVIS WITH CONTRAST TECHNIQUE: Multidetector CT imaging of the abdomen and pelvis was performed using the standard protocol following bolus administration of intravenous contrast. CONTRAST:  110m ISOVUE-300 IOPAMIDOL (ISOVUE-300) INJECTION 61% COMPARISON:  Noncontrast CT 03/20/2018 FINDINGS: Lower chest: Large paraesophageal varices noted. Hepatobiliary: Liver is heterogeneous with nodular contour consistent with cirrhosis. Gallbladder is markedly distended with multiple calcified stones measuring around 10 mm each. Gallbladder wall is ill-defined with a suggestion of pericholecystic edema. Axial image 21 of series 2 shows an 8 mm stone in the region of the porta hepatis. There is no stone at this location on the previous study and imaging features are suspicious for a cystic duct stone. No intrahepatic or extrahepatic biliary dilation. Pancreas: No focal mass lesion. No dilatation of the main duct. No intraparenchymal cyst. No peripancreatic edema. Spleen: No splenomegaly. No focal mass lesion.  Adrenals/Urinary Tract: No adrenal nodule or mass. Tiny low-density cortical lesions in each kidney are too small to characterize but are likely benign. No evidence for hydroureter. The urinary bladder appears normal for the degree of distention. Stomach/Bowel: Stomach is nondistended. No gastric wall thickening. No evidence of outlet obstruction. Duodenum is normally positioned as is the ligament of Treitz. Small bowel loops in the right lower quadrant are fluid-filled mildly distended up to 2.5 cm diameter. The terminal ileum is normal. The appendix is normal. Diverticular changes are noted in the left colon without evidence of diverticulitis. Vascular/Lymphatic: There is abdominal aortic atherosclerosis without aneurysm. Right common iliac artery is dilated up to 2 cm diameter. Portal vein appears enlarged but patent. Superior mesenteric vein and splenic vein are patent. Recanalized paraumbilical vein is prominent. As mentioned above, there are prominent paraesophageal varices. Vascular collateralization in the upper abdomen associated. Upper normal lymph nodes in the hepato duodenal ligament likely related to underlying liver disease. No pelvic sidewall lymphadenopathy. Reproductive: Prostate gland upper normal with dystrophic calcification in the parenchyma. Other: Tiny volume free fluid noted in the pelvis. There is edema and stranding in the right upper quadrant around the gallbladder and hepatic flexure. Tiny amount of fluid is seen adjacent to the liver. Musculoskeletal: No worrisome lytic or sclerotic osseous abnormality. IMPRESSION: 1. Distended gallbladder with numerous calcified stones in the lumen. Gallbladder wall appears irregular and there is edema/inflammation in the right upper quadrant around the gallbladder, inferior tip of the right liver, and hepatic flexure of the colon. A single stone is identified in the porta hepatis and is concerning for a cystic duct stone. Constellation of findings  suggest acute cholecystitis. If clinical picture is equivocal, abdominal ultrasound or nuclear scintigraphy may prove helpful to further evaluate. 2. Cirrhosis with prominent recanalized paraumbilical vein and large esophageal varices, consistent with associated portal venous hypertension. 3. Tiny volume fluid noted adjacent to the liver and in the anatomic pelvis. Electronically Signed   By: EMisty StanleyM.D.   On: 07/20/2018 17:37   [place]domen Limited Ruq  Result Date: 07/20/2018 CLINICAL DATA:  Epigastric pain. Right upper quadrant pain since this morning. EXAM: ULTRASOUND ABDOMEN LIMITED RIGHT UPPER QUADRANT COMPARISON:  CT abdomen/pelvis earlier this day FINDINGS: Gallbladder: Distended gallbladder spanning 14.7 cm in length. Gallbladder sludge with multiple intraluminal gallstones. The stone in the porta hepatis on CT is not definitively localized sonographically. No gallbladder wall thickening, normal wall thickness of 2 mm. Small volume perihepatic ascites and pericholecystic fluid. No sonographic Murphy sign noted by sonographer. Common bile duct: Diameter: 4-5 mm. Liver: No focal lesion identified. Nodular hepatic contours with increased echogenicity.  Portal vein is patent on color Doppler imaging with normal direction of blood flow towards the liver. IMPRESSION: 1. Distended gallbladder containing multiple intraluminal stones. No wall thickening or sonographic Murphy sign. Stone in the porta hepatis on CT is not localized sonographically. Consider nuclear medicine hepatic biliary scan to evaluate for cystic duct patency. 2. Hepatic cirrhosis. 3. Small amount of ascites in the right upper quadrant adjacent to the gallbladder and liver. Electronically Signed   By: Keith Rake M.D.   On: 07/20/2018 21:02    Review of Systems  Constitutional: Negative for chills and fever.  Respiratory: Negative for cough and shortness of breath.   Cardiovascular: Negative for chest pain and palpitations.   Gastrointestinal: Positive for abdominal pain, diarrhea, nausea and vomiting.  Genitourinary: Negative for dysuria, frequency and urgency.  Skin: Negative for itching and rash.  Neurological: Negative for dizziness and headaches.   Blood pressure (!) 109/56, pulse 69, temperature 98 F (36.7 C), temperature source Oral, resp. rate 18, height '5\' 6"'$  (1.676 m), weight 98.7 kg, SpO2 94 %. Physical Exam  Constitutional: He is oriented to person, place, and time. He appears well-developed and well-nourished.  HENT:  Head: Normocephalic and atraumatic.  Eyes: Pupils are equal, round, and reactive to light. Conjunctivae and EOM are normal.  Neck: Normal range of motion. Neck supple.  Cardiovascular: Normal rate and regular rhythm.  Respiratory: Effort normal. No respiratory distress.  GI: Soft.  Musculoskeletal: Normal range of motion.  Neurological: He is alert and oriented to person, place, and time.  Skin: Skin is warm and dry.  Abd: TTP RUQ  Assessment/Plan: 67 y.o. M with ESLD presents to ED with upper abd pain, nausea and hyperbilirubinemia.  RUQ shows stones only.  HIDA ordered.  Will f/u scan results.    Easter Schinke C. 37/10/6965, 8:93 AM

## 2018-07-22 ENCOUNTER — Telehealth: Payer: Self-pay | Admitting: Cardiology

## 2018-07-22 ENCOUNTER — Ambulatory Visit: Payer: Medicare Other | Admitting: *Deleted

## 2018-07-22 LAB — CBC
HCT: 42 % (ref 39.0–52.0)
Hemoglobin: 14.2 g/dL (ref 13.0–17.0)
MCH: 33.2 pg (ref 26.0–34.0)
MCHC: 33.8 g/dL (ref 30.0–36.0)
MCV: 98.1 fL (ref 80.0–100.0)
PLATELETS: 47 10*3/uL — AB (ref 150–400)
RBC: 4.28 MIL/uL (ref 4.22–5.81)
RDW: 14.1 % (ref 11.5–15.5)
WBC: 11.6 10*3/uL — ABNORMAL HIGH (ref 4.0–10.5)

## 2018-07-22 LAB — PROTIME-INR
INR: 2.17
Prothrombin Time: 24 seconds — ABNORMAL HIGH (ref 11.4–15.2)

## 2018-07-22 LAB — COMPREHENSIVE METABOLIC PANEL
ALT: 21 U/L (ref 0–44)
AST: 34 U/L (ref 15–41)
Albumin: 2.1 g/dL — ABNORMAL LOW (ref 3.5–5.0)
Alkaline Phosphatase: 109 U/L (ref 38–126)
Anion gap: 6 (ref 5–15)
BUN: 19 mg/dL (ref 8–23)
CHLORIDE: 104 mmol/L (ref 98–111)
CO2: 27 mmol/L (ref 22–32)
CREATININE: 0.96 mg/dL (ref 0.61–1.24)
Calcium: 8 mg/dL — ABNORMAL LOW (ref 8.9–10.3)
Glucose, Bld: 88 mg/dL (ref 70–99)
POTASSIUM: 3.6 mmol/L (ref 3.5–5.1)
SODIUM: 137 mmol/L (ref 135–145)
Total Bilirubin: 2.9 mg/dL — ABNORMAL HIGH (ref 0.3–1.2)
Total Protein: 6.2 g/dL — ABNORMAL LOW (ref 6.5–8.1)

## 2018-07-22 MED ORDER — LACTULOSE 10 GM/15ML PO SOLN
30.0000 g | Freq: Two times a day (BID) | ORAL | Status: DC
  Administered 2018-07-22 – 2018-07-24 (×5): 30 g via ORAL
  Filled 2018-07-22 (×5): qty 45

## 2018-07-22 MED ORDER — FUROSEMIDE 40 MG PO TABS
40.0000 mg | ORAL_TABLET | Freq: Every day | ORAL | Status: DC
  Administered 2018-07-22 – 2018-07-24 (×3): 40 mg via ORAL
  Filled 2018-07-22 (×3): qty 1

## 2018-07-22 NOTE — Telephone Encounter (Signed)
Spoke with pt and reminded pt of remote transmission that is due today. Pt verbalized understanding.   

## 2018-07-22 NOTE — Progress Notes (Signed)
[facility] Progress Note     Subjective: CC:  Reports 8/10 abdominal pain overnight, slightly improved s/p pain meds. Reports feeling distended. States he was having dark BMs when he came in but now is having loose, brown stools. Denies nausea or vomiting. States that last night he had some ginger ale and water but has not eaten any food - does not feel food exacerbated his abdominal pain, actually feeds eating helped his discomfort.  Objective: Vital signs in last 24 hours: Temp:  [97.7 F (36.5 C)-99.1 F (37.3 C)] 98.6 F (37 C) (10/08 0522) Pulse Rate:  [65-85] 85 (10/08 0522) Resp:  [18-20] 18 (10/08 0522) BP: (129-140)/(69-91) 129/79 (10/08 0522) SpO2:  [92 %-100 %] 95 % (10/08 0522) Weight:  [99.3 kg] 99.3 kg (10/08 0500) Last BM Date: 07/21/18  Intake/Output from previous day: 10/07 0701 - 10/08 0700 In: 1981.5 [P.O.:1380; I.V.:207.1; IV Piggyback:394.4] Out: 1200 [Urine:1200] Intake/Output this shift: No intake/output data recorded.  PE: Gen:  Alert, NAD, pleasant and cooperative  Card:  Regular rate and rhythm, No peripheral edema  Pulm:  Normal effort, clear to auscultation bilaterally Abd: firm, moderate distention, significantly TTP RUQ and epigastrium, +BS Skin: warm and dry, no rashes  Psych: A&Ox3    Lab Results:  Recent Labs    07/21/18 0352 07/22/18 0447  WBC 12.5* 11.6*  HGB 14.0 14.2  HCT 41.0 42.0  PLT 42* 47*   BMET Recent Labs    07/21/18 0352 07/22/18 0447  NA 136 137  K 3.4* 3.6  CL 103 104  CO2 26 27  GLUCOSE 128* 88  BUN 12 19  CREATININE 0.98 0.96  CALCIUM 7.8* 8.0*   PT/INR Recent Labs    07/22/18 0447  LABPROT 24.0*  INR 2.17   CMP     Component Value Date/Time   NA 137 07/22/2018 0447   NA 139 08/11/2015 1437   K 3.6 07/22/2018 0447   CL 104 07/22/2018 0447   CO2 27 07/22/2018 0447   GLUCOSE 88 07/22/2018 0447   BUN 19 07/22/2018 0447   BUN 11 08/11/2015 1437   CREATININE 0.96 07/22/2018 0447    CREATININE 0.78 08/25/2013 0803   CALCIUM 8.0 (L) 07/22/2018 0447   PROT 6.2 (L) 07/22/2018 0447   ALBUMIN 2.1 (L) 07/22/2018 0447   AST 34 07/22/2018 0447   ALT 21 07/22/2018 0447   ALKPHOS 109 07/22/2018 0447   BILITOT 2.9 (H) 07/22/2018 0447   GFRNONAA >60 07/22/2018 0447   GFRAA >60 07/22/2018 0447   Lipase     Component Value Date/Time   LIPASE 25 07/20/2018 1521       Studies/Results: Ct Abdomen Pelvis W Contrast  Result Date: 07/20/2018 CLINICAL DATA:  Abdominal pain and distension. EXAM: CT ABDOMEN AND PELVIS WITH CONTRAST TECHNIQUE: Multidetector CT imaging of the abdomen and pelvis was performed using the standard protocol following bolus administration of intravenous contrast. CONTRAST:  160mL ISOVUE-300 IOPAMIDOL (ISOVUE-300) INJECTION 61% COMPARISON:  Noncontrast CT 03/20/2018 FINDINGS: Lower chest: Large paraesophageal varices noted. Hepatobiliary: Liver is heterogeneous with nodular contour consistent with cirrhosis. Gallbladder is markedly distended with multiple calcified stones measuring around 10 mm each. Gallbladder wall is ill-defined with a suggestion of pericholecystic edema. Axial image 21 of series 2 shows an 8 mm stone in the region of the porta hepatis. There is no stone at this location on the previous study and imaging features are suspicious for a cystic duct stone. No intrahepatic or extrahepatic biliary dilation. Pancreas: No  focal mass lesion. No dilatation of the main duct. No intraparenchymal cyst. No peripancreatic edema. Spleen: No splenomegaly. No focal mass lesion. Adrenals/Urinary Tract: No adrenal nodule or mass. Tiny low-density cortical lesions in each kidney are too small to characterize but are likely benign. No evidence for hydroureter. The urinary bladder appears normal for the degree of distention. Stomach/Bowel: Stomach is nondistended. No gastric wall thickening. No evidence of outlet obstruction. Duodenum is normally positioned as is the  ligament of Treitz. Small bowel loops in the right lower quadrant are fluid-filled mildly distended up to 2.5 cm diameter. The terminal ileum is normal. The appendix is normal. Diverticular changes are noted in the left colon without evidence of diverticulitis. Vascular/Lymphatic: There is abdominal aortic atherosclerosis without aneurysm. Right common iliac artery is dilated up to 2 cm diameter. Portal vein appears enlarged but patent. Superior mesenteric vein and splenic vein are patent. Recanalized paraumbilical vein is prominent. As mentioned above, there are prominent paraesophageal varices. Vascular collateralization in the upper abdomen associated. Upper normal lymph nodes in the hepato duodenal ligament likely related to underlying liver disease. No pelvic sidewall lymphadenopathy. Reproductive: Prostate gland upper normal with dystrophic calcification in the parenchyma. Other: Tiny volume free fluid noted in the pelvis. There is edema and stranding in the right upper quadrant around the gallbladder and hepatic flexure. Tiny amount of fluid is seen adjacent to the liver. Musculoskeletal: No worrisome lytic or sclerotic osseous abnormality. IMPRESSION: 1. Distended gallbladder with numerous calcified stones in the lumen. Gallbladder wall appears irregular and there is edema/inflammation in the right upper quadrant around the gallbladder, inferior tip of the right liver, and hepatic flexure of the colon. A single stone is identified in the porta hepatis and is concerning for a cystic duct stone. Constellation of findings suggest acute cholecystitis. If clinical picture is equivocal, abdominal ultrasound or nuclear scintigraphy may prove helpful to further evaluate. 2. Cirrhosis with prominent recanalized paraumbilical vein and large esophageal varices, consistent with associated portal venous hypertension. 3. Tiny volume fluid noted adjacent to the liver and in the anatomic pelvis. Electronically Signed   By:  Misty Stanley M.D.   On: 07/20/2018 17:37   Nm Hepato W/eject Fract  Result Date: 07/21/2018 CLINICAL DATA:  Cholelithiasis. Epigastric and right upper quadrant abdominal pain since yesterday morning. EXAM: NUCLEAR MEDICINE HEPATOBILIARY IMAGING TECHNIQUE: Sequential images of the abdomen were obtained out to 60 minutes following intravenous administration of radiopharmaceutical. RADIOPHARMACEUTICALS:  7.4 mCi Tc-50m  Choletec IV COMPARISON:  Abdomen and pelvis CT dated 07/20/2018 and right upper quadrant abdomen ultrasound dated 07/20/2018. FINDINGS: Prompt visualization of the liver, common duct and small bowel. The gallbladder was not visualized initially. An additional 1.4 mCi of technetium 52m Choletec and 3.95 mg of morphine were then given intravenously. Additional imaging was obtained for 30 minutes following morphine administration. The gallbladder was still not visualized. IMPRESSION: Nonvisualized gallbladder. This is suspicious for obstruction of the cystic duct or gallbladder neck by one of the recently demonstrated gallstones. Electronically Signed   By: Claudie Revering M.D.   On: 07/21/2018 14:23   US Abdomen Limited Ruq  Result Date: 07/20/2018 CLINICAL DATA:  Epigastric pain. Right upper quadrant pain since this morning. EXAM: ULTRASOUND ABDOMEN LIMITED RIGHT UPPER QUADRANT COMPARISON:  CT abdomen/pelvis earlier this day FINDINGS: Gallbladder: Distended gallbladder spanning 14.7 cm in length. Gallbladder sludge with multiple intraluminal gallstones. The stone in the porta hepatis on CT is not definitively localized sonographically. No gallbladder wall thickening, normal wall thickness of 2  mm. Small volume perihepatic ascites and pericholecystic fluid. No sonographic Murphy sign noted by sonographer. Common bile duct: Diameter: 4-5 mm. Liver: No focal lesion identified. Nodular hepatic contours with increased echogenicity. Portal vein is patent on color Doppler imaging with normal direction of  blood flow towards the liver. IMPRESSION: 1. Distended gallbladder containing multiple intraluminal stones. No wall thickening or sonographic Murphy sign. Stone in the porta hepatis on CT is not localized sonographically. Consider nuclear medicine hepatic biliary scan to evaluate for cystic duct patency. 2. Hepatic cirrhosis. 3. Small amount of ascites in the right upper quadrant adjacent to the gallbladder and liver. Electronically Signed   By: Keith Rake M.D.   On: 07/20/2018 21:02    Anti-infectives: Anti-infectives (From admission, onward)   Start     Dose/Rate Route Frequency Ordered Stop   07/21/18 1000  cefTRIAXone (ROCEPHIN) 2 g in sodium chloride 0.9 % 100 mL IVPB     2 g 200 mL/hr over 30 Minutes Intravenous Every 24 hours 07/20/18 2218     07/21/18 0400  metroNIDAZOLE (FLAGYL) IVPB 500 mg     500 mg 100 mL/hr over 60 Minutes Intravenous Every 8 hours 07/20/18 2218     07/20/18 2000  cefTRIAXone (ROCEPHIN) 2 g in sodium chloride 0.9 % 100 mL IVPB     2 g 200 mL/hr over 30 Minutes Intravenous  Once 07/20/18 1946 07/20/18 2049   07/20/18 2000  metroNIDAZOLE (FLAGYL) IVPB 500 mg     500 mg 100 mL/hr over 60 Minutes Intravenous  Once 07/20/18 1946 07/20/18 2339     Assessment/Plan HTN Hep C  Cirrhosis, Childs C, with esophageal varices  Thrombocytopenia  PAF PMH pacemaker placement COPD  Abdominal pain Cholelithiasis  Possible cholecystitis - positive HIDA 10/8  - high risk surgical candidate given above conditions  - proceed with non-operative management of possible cholecystitis  - allow full liquids, advance to [facility] diet as tolerated.   - continue IV abx  - mobilize/OOB    LOS: 1 day    Obie Dredge, [facility] Surgery Pager: [phone]

## 2018-07-22 NOTE — Progress Notes (Signed)
PROGRESS NOTE  Hunter Carter  MBW:[id number] DOB: 01-Oct-1951 DOA: 07/20/2018 PCP: Nuala Alpha, DO   Brief Narrative: Hunter Carter is a 67 y.o. male with a history of hepatic cirrhosis complicated by esophageal varices, leukopenia and thrombocytopenia, history of alcohol abuse and treated hepatitis C, HTN, CAD s/p CABG, COPD, and PAF who presented with 3 days of worsening abdominal swelling and pain worst in the RUQ with an episode of dry heaving. He was afebrile with WBC 12.5, distended abdomen with CT abdomen showing a distended, edematous gallbladder with cholelithiasis on a background of changes consistent with portal hypertension and "tiny volume fluid" around the liver and in pelvis. There was concern for stone in the cystic duct. U/S confirmed distension with cholelithiasis but no wall thickening or Murphy's sign. HIDA is ordered and pending. Empiric antibiotics, ceftriaxone and flagyl, were started and surgery consulted. HIDA scan appeared to be positive though surgery was concerned that due to advanced cirrhosis this could be a false positive findings and even were to indicate cholecystitis, his surgical risk os prohibitive. Medical management is continued.  Assessment & Plan: Principal Problem:   Abdominal pain Active Problems:   HTN (hypertension)   CAD s/p CABG 2004   Pacemaker - dual chamber Medtronic 2011   Thrombocytopathia [facility])   Paroxysmal atrial fibrillation (HCC)   Cirrhosis ([place])  Abdominal pain and distention due to cholecystitis and ascites: Radiologic evidence of biliary etiology, +HIDA. SBP also in DDx.  - Continue empiric ceftriaxone, flagyl.  - Advancing diet as tolerated, so far taken some liquids and modest solid in 8.5/10 pain.  - Surgery is following, though he has a reported 80% mortality perioperatively for lap cholecystectomy.  - Consider Dx and Tx paracentesis  Hepatic cirrhosis: Multifactorial due to alcohol abuse and hepatitis C which per pt has  been treated by hepatologist, Dr. Caryl Pina. Noted on CT with esophageal varices. Bilirubin elevated at baseline. INR noted to be 2.17. Child-Pugh class C.  - Lactulose, lasix, consider spironolactone and possible para as above.   Thrombocytopenia: Stable, due to cirrhosis.  - Monitor  CAD s/p CABG: No typical anginal symptoms currently. Doesn't follow regularly with cardiology. No ischemic signs on ECG, troponin 0.03 which has been baseline, not consistent with ACS.  - Continue beta blocker, statin (benefit > risk with CAD despite cirrhosis with stable LFTs) when taking po as BP tolerates.   PAF: Rate controlled, not on anticoagulation due to thrombocytopenia.  - Monitor vital signs per protocol.  - Continue metoprolol when taking po.  HTN:  - Restart lasix, hold metoprolol, losartan. Will monitor and add back as able.  - prn hydralazine  History of dual-chamber pacemaker placement.  COPD: No exacerbation - Prns  DVT prophylaxis: SCDs Code Status: Full Family Communication: Daughter at bedside Disposition Plan: Continue medical management of presumed acute cholecystitis, cholelithiasis.   Consultants:   General surgery  Procedures:   None  Antimicrobials:  Ceftriaxone 10/6 >>   Flagyl 10/6 >>    Subjective: Still with 8.5/10 RUQ abdominal pain that is constant, waxing/waning, improved with tylenol, and only somewhat worsened with food. Eating a little. Having normal BMs. No fever.  Objective: Vitals:   07/21/18 1401 07/21/18 2059 07/22/18 0500 07/22/18 0522  BP: 140/69 (!) 139/91  129/79  Pulse: 65 72  85  Resp: 18 20  18   Temp: 97.7 F (36.5 C) 99.1 F (37.3 C)  98.6 F (37 C)  TempSrc: Oral Oral  Oral  SpO2: 92% 100%  95%  Weight:   99.3 kg   Height:        Intake/Output Summary (Last 24 hours) at 07/22/2018 1444 Last data filed at 07/22/2018 1422 Gross per 24 hour  Intake 2317.56 ml  Output 1200 ml  Net 1117.56 ml   Filed Weights   07/20/18 1515  07/21/18 0425 07/22/18 0500  Weight: 98.4 kg 98.7 kg 99.3 kg   Gen: 67 y.o. male in no distress Pulm: Nonlabored breathing room air. Clear. CV: Regular rate and rhythm. No murmur, rub, or gallop. No JVD, trace dependent edema. GI: Abdomen distended diffusely tender worst in RUQ. +BS.  Ext: Warm, no deformities Skin: No rashes, lesions or ulcers on visualized skin. No jaundice.  Neuro: Alert and oriented, no asterixis or encephalopathy. No focal neurological deficits. Psych: Judgement and insight appear fair. Mood euthymic & affect congruent. Behavior is appropriate.    Data Reviewed: I have personally reviewed following labs and imaging studies  CBC: Recent Labs  Lab 07/20/18 1521 07/20/18 2342 07/21/18 0352 07/22/18 0447  WBC 12.5* 12.5* 12.5* 11.6*  NEUTROABS  --   --  9.1*  --   HGB 16.0 14.7 14.0 14.2  HCT 46.1 42.9 41.0 42.0  MCV 97.3 98.4 97.6 98.1  PLT 42* 40* 42* 47*   Basic Metabolic Panel: Recent Labs  Lab 07/20/18 1521 07/21/18 0352 07/22/18 0447  NA 137 136 137  K 3.4* 3.4* 3.6  CL 103 103 104  CO2 25 26 27   GLUCOSE 156* 128* 88  BUN 9 12 19   CREATININE 0.85 0.98 0.96  CALCIUM 8.6* 7.8* 8.0*   GFR: Estimated Creatinine Clearance: 84.6 mL/min (by C-G formula based on SCr of 0.96 mg/dL). Liver Function Tests: Recent Labs  Lab 07/20/18 1521 07/21/18 0352 07/22/18 0447  AST 51* 40 34  ALT 31 25 21   ALKPHOS 130* 109 109  BILITOT 5.8* 3.9* 2.9*  PROT 7.3 6.1* 6.2*  ALBUMIN 2.5* 2.0* 2.1*   Recent Labs  Lab 07/20/18 1521  LIPASE 25   Coagulation Profile: Recent Labs  Lab 07/22/18 0447  INR 2.17   Cardiac Enzymes: Recent Labs  Lab 07/21/18 0720  TROPONINI 0.03*   Urine analysis:    Component Value Date/Time   COLORURINE YELLOW 07/20/2018 1521   APPEARANCEUR CLEAR 07/20/2018 1521   LABSPEC 1.034 (H) 07/20/2018 1521   PHURINE 7.0 07/20/2018 1521   GLUCOSEU NEGATIVE 07/20/2018 1521   HGBUR LARGE (A) 07/20/2018 1521   BILIRUBINUR  NEGATIVE 07/20/2018 1521   KETONESUR NEGATIVE 07/20/2018 1521   PROTEINUR NEGATIVE 07/20/2018 1521   NITRITE NEGATIVE 07/20/2018 1521   LEUKOCYTESUR NEGATIVE 07/20/2018 1521   No results found for this or any previous visit (from the past 240 hour(s)).    Radiology Studies: Ct Abdomen Pelvis W Contrast  Result Date: 07/20/2018 CLINICAL DATA:  Abdominal pain and distension. EXAM: CT ABDOMEN AND PELVIS WITH CONTRAST TECHNIQUE: Multidetector CT imaging of the abdomen and pelvis was performed using the standard protocol following bolus administration of intravenous contrast. CONTRAST:  154mL ISOVUE-300 IOPAMIDOL (ISOVUE-300) INJECTION 61% COMPARISON:  Noncontrast CT 03/20/2018 FINDINGS: Lower chest: Large paraesophageal varices noted. Hepatobiliary: Liver is heterogeneous with nodular contour consistent with cirrhosis. Gallbladder is markedly distended with multiple calcified stones measuring around 10 mm each. Gallbladder wall is ill-defined with a suggestion of pericholecystic edema. Axial image 21 of series 2 shows an 8 mm stone in the region of the porta hepatis. There is no stone at this location on the previous study and imaging features are  suspicious for a cystic duct stone. No intrahepatic or extrahepatic biliary dilation. Pancreas: No focal mass lesion. No dilatation of the main duct. No intraparenchymal cyst. No peripancreatic edema. Spleen: No splenomegaly. No focal mass lesion. Adrenals/Urinary Tract: No adrenal nodule or mass. Tiny low-density cortical lesions in each kidney are too small to characterize but are likely benign. No evidence for hydroureter. The urinary bladder appears normal for the degree of distention. Stomach/Bowel: Stomach is nondistended. No gastric wall thickening. No evidence of outlet obstruction. Duodenum is normally positioned as is the ligament of Treitz. Small bowel loops in the right lower quadrant are fluid-filled mildly distended up to 2.5 cm diameter. The terminal  ileum is normal. The appendix is normal. Diverticular changes are noted in the left colon without evidence of diverticulitis. Vascular/Lymphatic: There is abdominal aortic atherosclerosis without aneurysm. Right common iliac artery is dilated up to 2 cm diameter. Portal vein appears enlarged but patent. Superior mesenteric vein and splenic vein are patent. Recanalized paraumbilical vein is prominent. As mentioned above, there are prominent paraesophageal varices. Vascular collateralization in the upper abdomen associated. Upper normal lymph nodes in the hepato duodenal ligament likely related to underlying liver disease. No pelvic sidewall lymphadenopathy. Reproductive: Prostate gland upper normal with dystrophic calcification in the parenchyma. Other: Tiny volume free fluid noted in the pelvis. There is edema and stranding in the right upper quadrant around the gallbladder and hepatic flexure. Tiny amount of fluid is seen adjacent to the liver. Musculoskeletal: No worrisome lytic or sclerotic osseous abnormality. IMPRESSION: 1. Distended gallbladder with numerous calcified stones in the lumen. Gallbladder wall appears irregular and there is edema/inflammation in the right upper quadrant around the gallbladder, inferior tip of the right liver, and hepatic flexure of the colon. A single stone is identified in the porta hepatis and is concerning for a cystic duct stone. Constellation of findings suggest acute cholecystitis. If clinical picture is equivocal, abdominal ultrasound or nuclear scintigraphy may prove helpful to further evaluate. 2. Cirrhosis with prominent recanalized paraumbilical vein and large esophageal varices, consistent with associated portal venous hypertension. 3. Tiny volume fluid noted adjacent to the liver and in the anatomic pelvis. Electronically Signed   By: Misty Stanley M.D.   On: 07/20/2018 17:37   Nm Hepato W/eject Fract  Result Date: 07/21/2018 CLINICAL DATA:  Cholelithiasis.  Epigastric and right upper quadrant abdominal pain since yesterday morning. EXAM: NUCLEAR MEDICINE HEPATOBILIARY IMAGING TECHNIQUE: Sequential images of the abdomen were obtained out to 60 minutes following intravenous administration of radiopharmaceutical. RADIOPHARMACEUTICALS:  7.4 mCi Tc-42m  Choletec IV COMPARISON:  Abdomen and pelvis CT dated 07/20/2018 and right upper quadrant abdomen ultrasound dated 07/20/2018. FINDINGS: Prompt visualization of the liver, common duct and small bowel. The gallbladder was not visualized initially. An additional 1.4 mCi of technetium 26m Choletec and 3.95 mg of morphine were then given intravenously. Additional imaging was obtained for 30 minutes following morphine administration. The gallbladder was still not visualized. IMPRESSION: Nonvisualized gallbladder. This is suspicious for obstruction of the cystic duct or gallbladder neck by one of the recently demonstrated gallstones. Electronically Signed   By: Claudie Revering M.D.   On: 07/21/2018 14:23   US Abdomen Limited Ruq  Result Date: 07/20/2018 CLINICAL DATA:  Epigastric pain. Right upper quadrant pain since this morning. EXAM: ULTRASOUND ABDOMEN LIMITED RIGHT UPPER QUADRANT COMPARISON:  CT abdomen/pelvis earlier this day FINDINGS: Gallbladder: Distended gallbladder spanning 14.7 cm in length. Gallbladder sludge with multiple intraluminal gallstones. The stone in the porta hepatis on CT  is not definitively localized sonographically. No gallbladder wall thickening, normal wall thickness of 2 mm. Small volume perihepatic ascites and pericholecystic fluid. No sonographic Murphy sign noted by sonographer. Common bile duct: Diameter: 4-5 mm. Liver: No focal lesion identified. Nodular hepatic contours with increased echogenicity. Portal vein is patent on color Doppler imaging with normal direction of blood flow towards the liver. IMPRESSION: 1. Distended gallbladder containing multiple intraluminal stones. No wall thickening or  sonographic Murphy sign. Stone in the porta hepatis on CT is not localized sonographically. Consider nuclear medicine hepatic biliary scan to evaluate for cystic duct patency. 2. Hepatic cirrhosis. 3. Small amount of ascites in the right upper quadrant adjacent to the gallbladder and liver. Electronically Signed   By: Keith Rake M.D.   On: 07/20/2018 21:02    Scheduled Meds: Continuous Infusions: . sodium chloride 10 mL/hr at 07/22/18 0600  . cefTRIAXone (ROCEPHIN)  IV 2 g (07/22/18 0941)  . metronidazole 500 mg (07/22/18 1303)     LOS: 1 day   Time spent: 25 minutes.  Patrecia Pour, MD [facility] [email or web address] Password [email or web address] 07/22/2018, 2:44 PM

## 2018-07-23 NOTE — Progress Notes (Signed)
PROGRESS NOTE  Hunter Carter  XTG:[id number] DOB: 1950-11-27 DOA: 07/20/2018 PCP: Nuala Alpha, DO   Brief Narrative: Hunter Carter is a 67 y.o. male with a history of hepatic cirrhosis complicated by esophageal varices, leukopenia and thrombocytopenia, history of alcohol abuse and treated hepatitis C, HTN, CAD s/p CABG, COPD, and PAF who presented with 3 days of worsening abdominal swelling and pain worst in the RUQ with an episode of dry heaving. He was afebrile with WBC 12.5, distended abdomen with CT abdomen showing a distended, edematous gallbladder with cholelithiasis on a background of changes consistent with portal hypertension and "tiny volume fluid" around the liver and in pelvis. There was concern for stone in the cystic duct. U/S confirmed distension with cholelithiasis but no wall thickening or Murphy's sign. HIDA is ordered and pending. Empiric antibiotics, ceftriaxone and flagyl, were started and surgery consulted. HIDA scan appeared to be positive though surgery was concerned that due to advanced cirrhosis this could be a false positive findings and even were to indicate cholecystitis, his surgical risk os prohibitive. Medical management is continued and abdominal pain has started to improve. PO intake is still limited.  Assessment & Plan: Principal Problem:   Abdominal pain Active Problems:   HTN (hypertension)   CAD s/p CABG 2004   Pacemaker - dual chamber Med[employer]onic 2011   Thrombocytopathia [facility])   Paroxysmal atrial fibrillation (HCC)   Cirrhosis ([place])  Abdominal pain and distention due to cholecystitis and ascites: Radiologic evidence of biliary etiology, +HIDA. though advanced cirrhosis places the patient at ~80% mortality per surgery, so managing nonoperatively.  - Continue empiric ceftriaxone, flagyl. Suspect if pain and leukocytosis continue to improve for the next 24 hours, could be safely discharged on oral antibiotics. Discussed with surgery. - Pain improving,  leukocytosis remains, will reorder for the morning.   Hepatic cirrhosis: Child-Pugh class C. Multifactorial due to alcohol abuse and hepatitis C which per pt has been treated by hepatologist, Dr. Caryl Pina. Noted on CT with esophageal varices. Bilirubin elevated at baseline and trending downward. INR noted to be 2.17.  - Restarted lactulose, lasix, consider spironolactone.   Thrombocytopenia: Stable, due to cirrhosis.  - Monitor  CAD s/p CABG: No typical anginal symptoms currently. Doesn't follow regularly with cardiology. No ischemic signs on ECG, troponin 0.03 which has been baseline, not consistent with ACS.  - Continue beta blocker, statin (benefit > risk with CAD despite cirrhosis with stable LFTs) when taking po as BP tolerates.   PAF: Rate controlled, not on anticoagulation due to thrombocytopenia.  - Monitor vital signs per protocol.  - Continue metoprolol when taking po.  HTN:  - Restarted lasix, hold metoprolol, losartan. Will monitor and add back as able.  - prn hydralazine  History of dual-chamber pacemaker placement.  COPD: No exacerbation - Prns  DVT prophylaxis: SCDs Code Status: Full Family Communication: None at bedside this AM Disposition Plan: [place] home once consistently taking po and pain is bearable/improving.  Consultants:   General surgery  Procedures:   None  Antimicrobials:  Ceftriaxone 10/6 >>   Flagyl 10/6 >>    Subjective: RUQ pain now 5/10 after medications, sitting up more and cough still worsens pain. Starting to eat breakfast this morning. No dark stools or bleeding or fever.   Objective: Vitals:   07/22/18 2119 07/23/18 0500 07/23/18 0528 07/23/18 0846  BP: 128/77  117/69 140/81  Pulse: (!) 44  83 90  Resp: 16  20 15   Temp: 98.1 F (36.7 C)  97.8  F (36.6 C) 97.9 F (36.6 C)  TempSrc: Oral  Oral Oral  SpO2: 100%  100% 97%  Weight:  99.6 kg    Height:        Intake/Output Summary (Last 24 hours) at 07/23/2018  1146 Last data filed at 07/23/2018 0534 Gross per 24 hour  Intake 1270.57 ml  Output 150 ml  Net 1120.57 ml   Filed Weights   07/21/18 0425 07/22/18 0500 07/23/18 0500  Weight: 98.7 kg 99.3 kg 99.6 kg   Gen: 67 y.o. male in no distress Pulm: Nonlabored breathing room air. Clear. CV: Regular rate and rhythm. No murmur, rub, or gallop. No JVD, trace dependent edema. GI: Abdomen distended with ongoing tenderness to RUQ palpation though improved from prior exam, +BS. Still with technically positive Murphy's sign.  Ext: Warm, no deformities Skin: No new rashes, lesions or ulcers on visualized skin.  Neuro: Alert and oriented. No focal neurological deficits. Psych: Judgement and insight appear fair. Mood euthymic & affect congruent. Behavior is appropriate.    Data Reviewed: I have personally reviewed following labs and imaging studies  CBC: Recent Labs  Lab 07/20/18 1521 07/20/18 2342 07/21/18 0352 07/22/18 0447  WBC 12.5* 12.5* 12.5* 11.6*  NEUTROABS  --   --  9.1*  --   HGB 16.0 14.7 14.0 14.2  HCT 46.1 42.9 41.0 42.0  MCV 97.3 98.4 97.6 98.1  PLT 42* 40* 42* 47*   Basic Metabolic Panel: Recent Labs  Lab 07/20/18 1521 07/21/18 0352 07/22/18 0447  NA 137 136 137  K 3.4* 3.4* 3.6  CL 103 103 104  CO2 25 26 27   GLUCOSE 156* 128* 88  BUN 9 12 19   CREATININE 0.85 0.98 0.96  CALCIUM 8.6* 7.8* 8.0*   GFR: Estimated Creatinine Clearance: 84.7 mL/min (by C-G formula based on SCr of 0.96 mg/dL). Liver Function Tests: Recent Labs  Lab 07/20/18 1521 07/21/18 0352 07/22/18 0447  AST 51* 40 34  ALT 31 25 21   ALKPHOS 130* 109 109  BILITOT 5.8* 3.9* 2.9*  PROT 7.3 6.1* 6.2*  ALBUMIN 2.5* 2.0* 2.1*   Recent Labs  Lab 07/20/18 1521  LIPASE 25   Coagulation Profile: Recent Labs  Lab 07/22/18 0447  INR 2.17   Cardiac Enzymes: Recent Labs  Lab 07/21/18 0720  TROPONINI 0.03*   Urine analysis:    Component Value Date/Time   COLORURINE YELLOW 07/20/2018 1521    APPEARANCEUR CLEAR 07/20/2018 1521   LABSPEC 1.034 (H) 07/20/2018 1521   PHURINE 7.0 07/20/2018 1521   GLUCOSEU NEGATIVE 07/20/2018 1521   HGBUR LARGE (A) 07/20/2018 1521   BILIRUBINUR NEGATIVE 07/20/2018 1521   KETONESUR NEGATIVE 07/20/2018 1521   PROTEINUR NEGATIVE 07/20/2018 1521   NITRITE NEGATIVE 07/20/2018 1521   LEUKOCYTESUR NEGATIVE 07/20/2018 1521   No results found for this or any previous visit (from the past 240 hour(s)).    Radiology Studies: Nm Hepato W/eject Fract  Result Date: 07/21/2018 CLINICAL DATA:  Cholelithiasis. Epigastric and right upper quadrant abdominal pain since yesterday morning. EXAM: NUCLEAR MEDICINE HEPATOBILIARY IMAGING TECHNIQUE: Sequential images of the abdomen were obtained out to 60 minutes following intravenous administration of radiopharmaceutical. RADIOPHARMACEUTICALS:  7.4 mCi Tc-7m  Choletec IV COMPARISON:  Abdomen and pelvis CT dated 07/20/2018 and right upper quadrant abdomen ultrasound dated 07/20/2018. FINDINGS: Prompt visualization of the liver, common duct and small bowel. The gallbladder was not visualized initially. An additional 1.4 mCi of technetium 80m Choletec and 3.95 mg of morphine were then given intravenously. Additional  imaging was obtained for 30 minutes following morphine administration. The gallbladder was still not visualized. IMPRESSION: Nonvisualized gallbladder. This is suspicious for obstruction of the cystic duct or gallbladder neck by one of the recently demonstrated gallstones. Electronically Signed   By: Claudie Revering M.D.   On: 07/21/2018 14:23    Scheduled Meds: . furosemide  40 mg Oral Daily  . lactulose  30 g Oral BID   Continuous Infusions: . sodium chloride Stopped (07/23/18 0533)  . cefTRIAXone (ROCEPHIN)  IV 2 g (07/23/18 0921)  . metronidazole 100 mL/hr at 07/23/18 0534     LOS: 2 days   Time spent: 25 minutes.  Patrecia Pour, MD [facility] [email or web address] Password [email or web address] 07/23/2018, 11:46 AM

## 2018-07-23 NOTE — Progress Notes (Signed)
[facility] Progress Note     Subjective: CC:  Sitting up eating breakfast (omlette and potatoes). Rates pain 5/10, exacerbated by coughing. Feels less distended. Reports multiple non-bloody BMs yesterday. Denies nausea or vomiting.  Objective: Vital signs in last 24 hours: Temp:  [97.8 F (36.6 C)-99 F (37.2 C)] 97.8 F (36.6 C) (10/09 0528) Pulse Rate:  [44-88] 83 (10/09 0528) Resp:  [16-20] 20 (10/09 0528) BP: (117-147)/(69-88) 117/69 (10/09 0528) SpO2:  [93 %-100 %] 100 % (10/09 0528) Weight:  [99.6 kg] 99.6 kg (10/09 0500) Last BM Date: 07/22/18  Intake/Output from previous day: 10/08 0701 - 10/09 0700 In: 1620.2 [P.O.:1020; I.V.:200.1; IV Piggyback:400.1] Out: 150 [Urine:150] Intake/Output this shift: No intake/output data recorded.  PE: Gen:  Alert, NAD, pleasant Card:  Regular rate and rhythm, no peripheral edema  Pulm:  Normal effort, clear to auscultation bilaterally Abd: Firm, mild TTP RUQ, mild distention, bowel sounds present  Skin: warm and dry, no rashes  Psych: A&Ox3   Lab Results:  Recent Labs    07/21/18 0352 07/22/18 0447  WBC 12.5* 11.6*  HGB 14.0 14.2  HCT 41.0 42.0  PLT 42* 47*   BMET Recent Labs    07/21/18 0352 07/22/18 0447  NA 136 137  K 3.4* 3.6  CL 103 104  CO2 26 27  GLUCOSE 128* 88  BUN 12 19  CREATININE 0.98 0.96  CALCIUM 7.8* 8.0*   PT/INR Recent Labs    07/22/18 0447  LABPROT 24.0*  INR 2.17   CMP     Component Value Date/Time   NA 137 07/22/2018 0447   NA 139 08/11/2015 1437   K 3.6 07/22/2018 0447   CL 104 07/22/2018 0447   CO2 27 07/22/2018 0447   GLUCOSE 88 07/22/2018 0447   BUN 19 07/22/2018 0447   BUN 11 08/11/2015 1437   CREATININE 0.96 07/22/2018 0447   CREATININE 0.78 08/25/2013 0803   CALCIUM 8.0 (L) 07/22/2018 0447   PROT 6.2 (L) 07/22/2018 0447   ALBUMIN 2.1 (L) 07/22/2018 0447   AST 34 07/22/2018 0447   ALT 21 07/22/2018 0447   ALKPHOS 109 07/22/2018 0447   BILITOT 2.9 (H)  07/22/2018 0447   GFRNONAA >60 07/22/2018 0447   GFRAA >60 07/22/2018 0447   Lipase     Component Value Date/Time   LIPASE 25 07/20/2018 1521       Studies/Results: Nm Hepato W/eject Fract  Result Date: 07/21/2018 CLINICAL DATA:  Cholelithiasis. Epigastric and right upper quadrant abdominal pain since yesterday morning. EXAM: NUCLEAR MEDICINE HEPATOBILIARY IMAGING TECHNIQUE: Sequential images of the abdomen were obtained out to 60 minutes following intravenous administration of radiopharmaceutical. RADIOPHARMACEUTICALS:  7.4 mCi Tc-74m  Choletec IV COMPARISON:  Abdomen and pelvis CT dated 07/20/2018 and right upper quadrant abdomen ultrasound dated 07/20/2018. FINDINGS: Prompt visualization of the liver, common duct and small bowel. The gallbladder was not visualized initially. An additional 1.4 mCi of technetium 51m Choletec and 3.95 mg of morphine were then given intravenously. Additional imaging was obtained for 30 minutes following morphine administration. The gallbladder was still not visualized. IMPRESSION: Nonvisualized gallbladder. This is suspicious for obstruction of the cystic duct or gallbladder neck by one of the recently demonstrated gallstones. Electronically Signed   By: Claudie Revering M.D.   On: 07/21/2018 14:23    Anti-infectives: Anti-infectives (From admission, onward)   Start     Dose/Rate Route Frequency Ordered Stop   07/21/18 1000  cefTRIAXone (ROCEPHIN) 2 g in sodium chloride 0.9 %  100 mL IVPB     2 g 200 mL/hr over 30 Minutes Intravenous Every 24 hours 07/20/18 2218     07/21/18 0400  metroNIDAZOLE (FLAGYL) IVPB 500 mg     500 mg 100 mL/hr over 60 Minutes Intravenous Every 8 hours 07/20/18 2218     07/20/18 2000  cefTRIAXone (ROCEPHIN) 2 g in sodium chloride 0.9 % 100 mL IVPB     2 g 200 mL/hr over 30 Minutes Intravenous  Once 07/20/18 1946 07/20/18 2049   07/20/18 2000  metroNIDAZOLE (FLAGYL) IVPB 500 mg     500 mg 100 mL/hr over 60 Minutes Intravenous  Once  07/20/18 1946 07/20/18 2339       Assessment/Plan HTN Hep C  Cirrhosis, Childs C, with esophageal varices  Thrombocytopenia  PAF PMH pacemaker placement COPD  Abdominal pain Cholelithiasis  Possible cholecystitis - positive HIDA 10/8  - high risk surgical candidate given above conditions  - WBC has been trending down, not ordered for today. - allow HH diet  - continue IV abx  - mobilize/OOB  - proceed with non-operative management of possible cholecystitis. IF patient continues to improve I think he could go home tomorrow on PO abx.   LOS: 2 days    Obie Dredge, [facility] Pager: [phone]

## 2018-07-24 LAB — CBC
HCT: 42 % (ref 39.0–52.0)
HEMOGLOBIN: 13.9 g/dL (ref 13.0–17.0)
MCH: 32.3 pg (ref 26.0–34.0)
MCHC: 33.1 g/dL (ref 30.0–36.0)
MCV: 97.4 fL (ref 80.0–100.0)
Platelets: 53 10*3/uL — ABNORMAL LOW (ref 150–400)
RBC: 4.31 MIL/uL (ref 4.22–5.81)
RDW: 13.7 % (ref 11.5–15.5)
WBC: 8.9 10*3/uL (ref 4.0–10.5)
nRBC: 0 % (ref 0.0–0.2)

## 2018-07-24 LAB — COMPREHENSIVE METABOLIC PANEL
ALK PHOS: 112 U/L (ref 38–126)
ALT: 21 U/L (ref 0–44)
ANION GAP: 7 (ref 5–15)
AST: 41 U/L (ref 15–41)
Albumin: 1.9 g/dL — ABNORMAL LOW (ref 3.5–5.0)
BILIRUBIN TOTAL: 2.5 mg/dL — AB (ref 0.3–1.2)
BUN: 13 mg/dL (ref 8–23)
CHLORIDE: 104 mmol/L (ref 98–111)
CO2: 26 mmol/L (ref 22–32)
Calcium: 7.8 mg/dL — ABNORMAL LOW (ref 8.9–10.3)
Creatinine, Ser: 0.8 mg/dL (ref 0.61–1.24)
GFR calc Af Amer: >60 mL/min (ref >60)
Glucose, Bld: 105 mg/dL — ABNORMAL HIGH (ref 70–99)
Potassium: 3.3 mmol/L — ABNORMAL LOW (ref 3.5–5.1)
SODIUM: 137 mmol/L (ref 135–145)
TOTAL PROTEIN: 6.2 g/dL — AB (ref 6.5–8.1)

## 2018-07-24 MED ORDER — POTASSIUM CHLORIDE CRYS ER 20 MEQ PO TBCR
40.0000 meq | EXTENDED_RELEASE_TABLET | Freq: Once | ORAL | Status: AC
  Administered 2018-07-24: 40 meq via ORAL
  Filled 2018-07-24: qty 2

## 2018-07-24 MED ORDER — AMOXICILLIN-POT CLAVULANATE 875-125 MG PO TABS: 1.0000 | ORAL_TABLET | Freq: Two times a day (BID) | ORAL | 0 refills | Status: AC

## 2018-07-24 NOTE — Care Management Important Message (Signed)
Important Message  Patient Details  Name: Hunter Carter MRN: [id number] Date of Birth: [birth year]   Medicare Important Message Given:  Yes    Kerin Salen 07/24/2018, 11:00 AMImportant Message  Patient Details  Name: Hunter Carter MRN: [id number] Date of Birth: Mar 01, 1951   Medicare Important Message Given:  Yes    Kerin Salen 07/24/2018, 11:00 AM

## 2018-07-24 NOTE — Discharge Summary (Addendum)
Physician Discharge Summary  Hunter Carter IRC:[id number] DOB: 06/11/1951 DOA: 07/20/2018  PCP: Nuala Alpha, DO  Admit date: 07/20/2018 Discharge date: 07/24/2018  Admitted From: Home Disposition: Home   Recommendations for Outpatient Follow-up:  1. Follow up with PCP in 1-2 weeks  Home Health: None Equipment/Devices: None Discharge Condition: Stable CODE STATUS: Full Diet recommendation: Heart healthy  Brief/Interim Summary: Hunter Carter is a 67 y.o. male with a history of hepatic cirrhosis complicated by esophageal varices, leukopenia and thrombocytopenia, history of alcohol abuse and treated hepatitis C, HTN, CAD s/p CABG, COPD, and PAF who presented with 3 days of worsening abdominal swelling and pain worst in the RUQ with an episode of dry heaving. He was afebrile with WBC 12.5, distended abdomen with CT abdomen showing a distended, edematous gallbladder with cholelithiasis on a background of changes consistent with portal hypertension and "tiny volume fluid" around the liver and in pelvis. There was concern for stone in the cystic duct. U/S confirmed distension with cholelithiasis but no wall thickening or Murphy's sign. Empiric antibiotics, ceftriaxone and flagyl, were started and surgery consulted. HIDA scan appeared to be positive though surgery was concerned that due to advanced cirrhosis this could be a false positive findings and even were it to indicate cholecystitis, his surgical risk is prohibitive. Medical management was continued with steady improvement in pain and per oral intake. He has ambulated without difficulty and is stable on oral medications. He will be discharged home with plans to follow up with PCP.  Discharge Diagnoses:  Principal Problem:   Abdominal pain Active Problems:   HTN (hypertension)   CAD s/p CABG 2004   Pacemaker - dual chamber Medtronic 2011   Thrombocytopathia [facility])   Paroxysmal atrial fibrillation (HCC)   Cirrhosis  ([place])  Abdominal pain and distention due to cholecystitis and ascites: Radiologic evidence of biliary etiology, +HIDA. though advanced cirrhosis places the patient at ~80% mortality per surgery, so managing nonoperatively.  - Continued empiric ceftriaxone, flagyl. Leukocytosis has resolved, pain has improved significantly. Medical team and surgery feel the patient is stable for discharge on oral antibiotics, augmentin for a total of 14 days.  Hepatic cirrhosis: Child-Pugh class C. Multifactorial due to alcohol abuse and hepatitis C which per pt has been treated by hepatologist, Dr. Caryl Pina. Noted on CT with esophageal varices. Bilirubin elevated at baseline and trending downward. INR noted to be 2.17.  - Restarted home medications. If BP becomes elevated, would consider spironolactone.   Thrombocytopenia: Stable, due to cirrhosis.  - Monitor  CAD s/p CABG: No typical anginal symptoms currently. Doesn't follow regularly with cardiology. No ischemic signs on ECG, troponin 0.03 which has been baseline, not consistent with ACS.  - Continue home medications.  Chronic diastolic CHF: Per history, not an active issue.  PAF: Rate controlled, not on anticoagulation due to thrombocytopenia.  - Continue home medications.  HTN:  - Continue home medications  History of dual-chamber pacemaker placement.  COPD: No exacerbation - Prns  Discharge Instructions Discharge Instructions    Diet - low sodium heart healthy   Complete by:  As directed    Discharge instructions   Complete by:  As directed    You were admitted with abdominal pain due to an inflammed gallbladder (cholecystitis) which is usually treated with surgery, though you are at very high risk for complications from surgery and have been treated medically with antibiotics. Fortuantely you have improved and are stable for discharge with the following recommendations:  - Take augmentin twice daily for  10 days to complete the course of  therapy - You may take tylenol as needed for pain but not for more than the next 10 days and no more than 3 grams total dose per day. - Continue taking other medications as you were - Follow up with your primary doctor in the next 2 weeks and remember if you need to come to the hospital again for any reason, you should consider going to [facility] where your primary doctor's practice can see you.  - If your symptoms WORSEN, seek medical attention right away.   Increase activity slowly   Complete by:  As directed      Allergies as of 07/24/2018   No Known Allergies     Medication List    TAKE these medications   amoxicillin-clavulanate 875-125 MG tablet Commonly known as:  AUGMENTIN Take 1 tablet by mouth 2 (two) times daily for 10 days.   furosemide 40 MG tablet Commonly known as:  LASIX TAKE 1 TABLET BY MOUTH  DAILY AS NEEDED   lactulose 10 GM/15ML solution Commonly known as:  CHRONULAC Take 45 mLs (30 g total) by mouth 2 (two) times daily. Titrate for 3-4 BM's/ day   losartan 25 MG tablet Commonly known as:  COZAAR TAKE 1 TABLET BY MOUTH  DAILY   metoprolol tartrate 50 MG tablet Commonly known as:  LOPRESSOR TAKE 1 TABLET BY MOUTH TWO  TIMES DAILY   omeprazole 20 MG capsule Commonly known as:  PRILOSEC TAKE 1 CAPSULE BY MOUTH  DAILY   PROAIR HFA 108 (90 Base) MCG/ACT inhaler Generic drug:  albuterol USE 2 PUFFS BY MOUTH EVERY  4 HOURS AS NEEDED FOR  WHEEZING OR SHORTNESS OF  BREATH (OR COUGHING)   simvastatin 20 MG tablet Commonly known as:  ZOCOR TAKE 1 TABLET BY MOUTH  DAILY      Follow-up Information    Nuala Alpha, DO. Schedule an appointment as soon as possible for a visit in 2 week(s).   Specialty:  Family Medicine Contact information: [address] [place] [address] [phone]          No Known Allergies  Consultations:  General surgerry, Dr. Redmond Pulling  Procedures/Studies: Ct Abdomen Pelvis W Contrast  Result Date:  07/20/2018 CLINICAL DATA:  Abdominal pain and distension. EXAM: CT ABDOMEN AND PELVIS WITH CONTRAST TECHNIQUE: Multidetector CT imaging of the abdomen and pelvis was performed using the standard protocol following bolus administration of intravenous contrast. CONTRAST:  13mL ISOVUE-300 IOPAMIDOL (ISOVUE-300) INJECTION 61% COMPARISON:  Noncontrast CT 03/20/2018 FINDINGS: Lower chest: Large paraesophageal varices noted. Hepatobiliary: Liver is heterogeneous with nodular contour consistent with cirrhosis. Gallbladder is markedly distended with multiple calcified stones measuring around 10 mm each. Gallbladder wall is ill-defined with a suggestion of pericholecystic edema. Axial image 21 of series 2 shows an 8 mm stone in the region of the porta hepatis. There is no stone at this location on the previous study and imaging features are suspicious for a cystic duct stone. No intrahepatic or extrahepatic biliary dilation. Pancreas: No focal mass lesion. No dilatation of the main duct. No intraparenchymal cyst. No peripancreatic edema. Spleen: No splenomegaly. No focal mass lesion. Adrenals/Urinary Tract: No adrenal nodule or mass. Tiny low-density cortical lesions in each kidney are too small to characterize but are likely benign. No evidence for hydroureter. The urinary bladder appears normal for the degree of distention. Stomach/Bowel: Stomach is nondistended. No gastric wall thickening. No evidence of outlet obstruction. Duodenum is normally positioned as  is the ligament of Treitz. Small bowel loops in the right lower quadrant are fluid-filled mildly distended up to 2.5 cm diameter. The terminal ileum is normal. The appendix is normal. Diverticular changes are noted in the left colon without evidence of diverticulitis. Vascular/Lymphatic: There is abdominal aortic atherosclerosis without aneurysm. Right common iliac artery is dilated up to 2 cm diameter. Portal vein appears enlarged but patent. Superior mesenteric vein  and splenic vein are patent. Recanalized paraumbilical vein is prominent. As mentioned above, there are prominent paraesophageal varices. Vascular collateralization in the upper abdomen associated. Upper normal lymph nodes in the hepato duodenal ligament likely related to underlying liver disease. No pelvic sidewall lymphadenopathy. Reproductive: Prostate gland upper normal with dystrophic calcification in the parenchyma. Other: Tiny volume free fluid noted in the pelvis. There is edema and stranding in the right upper quadrant around the gallbladder and hepatic flexure. Tiny amount of fluid is seen adjacent to the liver. Musculoskeletal: No worrisome lytic or sclerotic osseous abnormality. IMPRESSION: 1. Distended gallbladder with numerous calcified stones in the lumen. Gallbladder wall appears irregular and there is edema/inflammation in the right upper quadrant around the gallbladder, inferior tip of the right liver, and hepatic flexure of the colon. A single stone is identified in the porta hepatis and is concerning for a cystic duct stone. Constellation of findings suggest acute cholecystitis. If clinical picture is equivocal, abdominal ultrasound or nuclear scintigraphy may prove helpful to further evaluate. 2. Cirrhosis with prominent recanalized paraumbilical vein and large esophageal varices, consistent with associated portal venous hypertension. 3. Tiny volume fluid noted adjacent to the liver and in the anatomic pelvis. Electronically Signed   By: Misty Stanley M.D.   On: 07/20/2018 17:37   Nm Hepato W/eject Fract  Result Date: 07/21/2018 CLINICAL DATA:  Cholelithiasis. Epigastric and right upper quadrant abdominal pain since yesterday morning. EXAM: NUCLEAR MEDICINE HEPATOBILIARY IMAGING TECHNIQUE: Sequential images of the abdomen were obtained out to 60 minutes following intravenous administration of radiopharmaceutical. RADIOPHARMACEUTICALS:  7.4 mCi Tc-2m  Choletec IV COMPARISON:  Abdomen and  pelvis CT dated 07/20/2018 and right upper quadrant abdomen ultrasound dated 07/20/2018. FINDINGS: Prompt visualization of the liver, common duct and small bowel. The gallbladder was not visualized initially. An additional 1.4 mCi of technetium 43m Choletec and 3.95 mg of morphine were then given intravenously. Additional imaging was obtained for 30 minutes following morphine administration. The gallbladder was still not visualized. IMPRESSION: Nonvisualized gallbladder. This is suspicious for obstruction of the cystic duct or gallbladder neck by one of the recently demonstrated gallstones. Electronically Signed   By: Claudie Revering M.D.   On: 07/21/2018 14:23   US Abdomen Limited Ruq  Result Date: 07/20/2018 CLINICAL DATA:  Epigastric pain. Right upper quadrant pain since this morning. EXAM: ULTRASOUND ABDOMEN LIMITED RIGHT UPPER QUADRANT COMPARISON:  CT abdomen/pelvis earlier this day FINDINGS: Gallbladder: Distended gallbladder spanning 14.7 cm in length. Gallbladder sludge with multiple intraluminal gallstones. The stone in the porta hepatis on CT is not definitively localized sonographically. No gallbladder wall thickening, normal wall thickness of 2 mm. Small volume perihepatic ascites and pericholecystic fluid. No sonographic Murphy sign noted by sonographer. Common bile duct: Diameter: 4-5 mm. Liver: No focal lesion identified. Nodular hepatic contours with increased echogenicity. Portal vein is patent on color Doppler imaging with normal direction of blood flow towards the liver. IMPRESSION: 1. Distended gallbladder containing multiple intraluminal stones. No wall thickening or sonographic Murphy sign. Stone in the porta hepatis on CT is not localized sonographically. Consider nuclear  medicine hepatic biliary scan to evaluate for cystic duct patency. 2. Hepatic cirrhosis. 3. Small amount of ascites in the right upper quadrant adjacent to the gallbladder and liver. Electronically Signed   By: Keith Rake M.D.   On: 07/20/2018 21:02   Subjective: Abdominal pain now down to a 4/10 in RUQ, nonradiating, constant, waxing/waning, not worse with food, able to tolerate po without nausea or vomiting. PT walked with him for >465feet without need for assistance. No follow up recommended.   Discharge Exam: Vitals:   07/24/18 0543 07/24/18 0952  BP: 138/80 136/80  Pulse: 86 77  Resp: 18   Temp: 98.4 F (36.9 C)   SpO2: 98%    General: Pt is alert, awake, not in acute distress Cardiovascular: RRR, S1/S2 +, no rubs, no gallops Respiratory: CTA bilaterally, no wheezing, no rhonchi Abdominal: Softer than prior exams, minimally tender in RUQ without rebound or guarding, ND, bowel sounds + Extremities: No edema, no cyanosis  Labs: BNP (last 3 results) No results for input(s): BNP in the last 8760 hours. Basic Metabolic Panel: Recent Labs  Lab 07/20/18 1521 07/21/18 0352 07/22/18 0447 07/24/18 0458  NA 137 136 137 137  K 3.4* 3.4* 3.6 3.3*  CL 103 103 104 104  CO2 25 26 27 26   GLUCOSE 156* 128* 88 105*  BUN 9 12 19 13   CREATININE 0.85 0.98 0.96 0.80  CALCIUM 8.6* 7.8* 8.0* 7.8*   Liver Function Tests: Recent Labs  Lab 07/20/18 1521 07/21/18 0352 07/22/18 0447 07/24/18 0458  AST 51* 40 34 41  ALT 31 25 21 21   ALKPHOS 130* 109 109 112  BILITOT 5.8* 3.9* 2.9* 2.5*  PROT 7.3 6.1* 6.2* 6.2*  ALBUMIN 2.5* 2.0* 2.1* 1.9*   Recent Labs  Lab 07/20/18 1521  LIPASE 25   No results for input(s): AMMONIA in the last 168 hours. CBC: Recent Labs  Lab 07/20/18 1521 07/20/18 2342 07/21/18 0352 07/22/18 0447 07/24/18 0458  WBC 12.5* 12.5* 12.5* 11.6* 8.9  NEUTROABS  --   --  9.1*  --   --   HGB 16.0 14.7 14.0 14.2 13.9  HCT 46.1 42.9 41.0 42.0 42.0  MCV 97.3 98.4 97.6 98.1 97.4  PLT 42* 40* 42* 47* 53*   Cardiac Enzymes: Recent Labs  Lab 07/21/18 0720  TROPONINI 0.03*   BNP: Invalid input(s): POCBNP CBG: No results for input(s): GLUCAP in the last 168  hours. D-Dimer No results for input(s): DDIMER in the last 72 hours. Hgb A1c No results for input(s): HGBA1C in the last 72 hours. Lipid Profile No results for input(s): CHOL, HDL, LDLCALC, TRIG, CHOLHDL, LDLDIRECT in the last 72 hours. Thyroid function studies No results for input(s): TSH, T4TOTAL, T3FREE, THYROIDAB in the last 72 hours.  Invalid input(s): FREET3 Anemia work up No results for input(s): VITAMINB12, FOLATE, FERRITIN, TIBC, IRON, RETICCTPCT in the last 72 hours. Urinalysis    Component Value Date/Time   COLORURINE YELLOW 07/20/2018 1521   APPEARANCEUR CLEAR 07/20/2018 1521   LABSPEC 1.034 (H) 07/20/2018 1521   PHURINE 7.0 07/20/2018 1521   GLUCOSEU NEGATIVE 07/20/2018 1521   HGBUR LARGE (A) 07/20/2018 1521   BILIRUBINUR NEGATIVE 07/20/2018 1521   KETONESUR NEGATIVE 07/20/2018 1521   PROTEINUR NEGATIVE 07/20/2018 1521   NITRITE NEGATIVE 07/20/2018 1521   LEUKOCYTESUR NEGATIVE 07/20/2018 1521    Microbiology No results found for this or any previous visit (from the past 240 hour(s)).  Time coordinating discharge: Approximately 40 minutes  Patrecia Pour,  MD  [facility] 07/24/2018, 1:30 PM Pager [phone]

## 2018-07-24 NOTE — Progress Notes (Signed)
Discharge instructions reviewed with patient. All questions answered. Patient wheeled to vehicle with belongings by nurse tech. 

## 2018-07-24 NOTE — Progress Notes (Signed)
[facility] Progress Note     Subjective: CC:  Patient feels he is overall improving - less abdominal pain/pressure. Denies nausea or vomiting. Having loose, non-bloody BMs. States he has not been mobilizing in the hospital 2/2 abdominal discomfort. Expresses some hesitation about hospital discharge because he lives alone - states his house does not have stairs and is all one story.   Objective: Vital signs in last 24 hours: Temp:  [97.9 F (36.6 C)-99.1 F (37.3 C)] 98.4 F (36.9 C) (10/10 0543) Pulse Rate:  [39-90] 86 (10/10 0543) Resp:  [15-22] 18 (10/10 0543) BP: (118-162)/(72-95) 138/80 (10/10 0543) SpO2:  [94 %-98 %] 98 % (10/10 0543) Weight:  [101.8 kg] 101.8 kg (10/10 0547) Last BM Date: 07/23/18  Intake/Output from previous day: 10/09 0701 - 10/10 0700 In: 1100.4 [P.O.:960; I.V.:140.4] Out: 1100 [Urine:1100] Intake/Output this shift: No intake/output data recorded.  PE: Card:  Regular rate and rhythm, no peripheral edema  Pulm:  Normal effort, clear to auscultation bilaterally Abd: soft, protuberant, mild TTP RUQ - no guarding or peritonitis, mild distention, bowel sounds present  Skin: warm and dry, no rashes  Psych: A&Ox3   Lab Results:  Recent Labs    07/22/18 0447 07/24/18 0458  WBC 11.6* 8.9  HGB 14.2 13.9  HCT 42.0 42.0  PLT 47* 53*   BMET Recent Labs    07/22/18 0447 07/24/18 0458  NA 137 137  K 3.6 3.3*  CL 104 104  CO2 27 26  GLUCOSE 88 105*  BUN 19 13  CREATININE 0.96 0.80  CALCIUM 8.0* 7.8*   PT/INR Recent Labs    07/22/18 0447  LABPROT 24.0*  INR 2.17   CMP     Component Value Date/Time   NA 137 07/24/2018 0458   NA 139 08/11/2015 1437   K 3.3 (L) 07/24/2018 0458   CL 104 07/24/2018 0458   CO2 26 07/24/2018 0458   GLUCOSE 105 (H) 07/24/2018 0458   BUN 13 07/24/2018 0458   BUN 11 08/11/2015 1437   CREATININE 0.80 07/24/2018 0458   CREATININE 0.78 08/25/2013 0803   CALCIUM 7.8 (L) 07/24/2018 0458   PROT  6.2 (L) 07/24/2018 0458   ALBUMIN 1.9 (L) 07/24/2018 0458   AST 41 07/24/2018 0458   ALT 21 07/24/2018 0458   ALKPHOS 112 07/24/2018 0458   BILITOT 2.5 (H) 07/24/2018 0458   GFRNONAA >60 07/24/2018 0458   GFRAA >60 07/24/2018 0458   Lipase     Component Value Date/Time   LIPASE 25 07/20/2018 1521       Studies/Results: No results found.  Anti-infectives: Anti-infectives (From admission, onward)   Start     Dose/Rate Route Frequency Ordered Stop   07/21/18 1000  cefTRIAXone (ROCEPHIN) 2 g in sodium chloride 0.9 % 100 mL IVPB     2 g 200 mL/hr over 30 Minutes Intravenous Every 24 hours 07/20/18 2218     07/21/18 0400  metroNIDAZOLE (FLAGYL) IVPB 500 mg     500 mg 100 mL/hr over 60 Minutes Intravenous Every 8 hours 07/20/18 2218     07/20/18 2000  cefTRIAXone (ROCEPHIN) 2 g in sodium chloride 0.9 % 100 mL IVPB     2 g 200 mL/hr over 30 Minutes Intravenous  Once 07/20/18 1946 07/20/18 2049   07/20/18 2000  metroNIDAZOLE (FLAGYL) IVPB 500 mg     500 mg 100 mL/hr over 60 Minutes Intravenous  Once 07/20/18 1946 07/20/18 2339       Assessment/Plan HTN Hep  C  Cirrhosis, Childs C, with esophageal varices  Thrombocytopenia  PAF PMH pacemaker placement COPD  Abdominal pain Cholelithiasis  Possible cholecystitis - positive HIDA 10/8  - high risk surgical candidate given above conditions  - WBC now normal after 4 days of IV abx  - HH diet - mobilize/OOB; I have ordered PT eval w/ imminent DC due to patient mobility concerns. - stable for discharge home from a surgical perspective - would recommend continuing PO abx to complete a TOTAL of 14 days of abx therapy.  .  LOS: 3 days     Obie Dredge, [facility] Pager: [phone]

## 2018-07-24 NOTE — Evaluation (Signed)
Physical Therapy Evaluation-1x Patient Details Name: Hunter Carter MRN: [id number] DOB: 10/10/1951 Today's Date: 07/24/2018   History of Present Illness  67 yo male admitted with abd pain, cholelithiasis, possible cholecystitis. Hx of HEP C, alcoholism, CAD, CABG, A fib, chronic thrombocytopenia, falls, R BBB, pacemaker, liver cirrhosis    Clinical Impression  On eval, pt was Mod Ind with mobility. He walked ~400 feet while pushing Iv pole. No LOB. Pt tolerated activity well. 1x eval. Will sign off.     Follow Up Recommendations No PT follow up    Equipment Recommendations  None recommended by PT    Recommendations for Other Services       Precautions / Restrictions Precautions Precautions: Fall Restrictions Weight Bearing Restrictions: No      Mobility  Bed Mobility Overal bed mobility: Modified Independent                Transfers Overall transfer level: Modified independent                  Ambulation/Gait Ambulation/Gait assistance: Modified independent (Device/Increase time) Gait Distance (Feet): 400 Feet Assistive device: IV Pole Gait Pattern/deviations: Step-through pattern;Decreased stride length     General Gait Details: slow gait speed. No LOB.   Stairs            Wheelchair Mobility    Modified Rankin (Stroke Patients Only)       Balance Overall balance assessment: Mild deficits observed, not formally tested                                           Pertinent Vitals/Pain Pain Assessment: Faces Faces Pain Scale: Hurts little more Pain Location: abdomen Pain Descriptors / Indicators: Sore Pain Intervention(s): Monitored during session    Home Living Family/patient expects to be discharged to:: Private residence Living Arrangements: Alone   Type of Home: Apartment Home Access: Level entry     Home Layout: One level Home Equipment: Cane - single point      Prior Function Level of Independence:  Independent with assistive device(s)         Comments: uses PRN-mostly in community. still drives some.      Hand Dominance        Extremity/Trunk Assessment   Upper Extremity Assessment Upper Extremity Assessment: Overall WFL for tasks assessed    Lower Extremity Assessment Lower Extremity Assessment: Generalized weakness    Cervical / Trunk Assessment Cervical / Trunk Assessment: Normal  Communication   Communication: No difficulties  Cognition Arousal/Alertness: Awake/alert Behavior During Therapy: WFL for tasks assessed/performed Overall Cognitive Status: Within Functional Limits for tasks assessed                                        General Comments      Exercises     Assessment/Plan    PT Assessment Patent does not need any further PT services  PT Problem List         PT Treatment Interventions      PT Goals (Current goals can be found in the Care Plan section)  Acute Rehab PT Goals Patient Stated Goal: home soon PT Goal Formulation: All assessment and education complete, DC therapy    Frequency     Barriers to discharge  Co-evaluation               AM-PAC PT "6 Clicks" Daily Activity  Outcome Measure Difficulty turning over in bed (including adjusting bedclothes, sheets and blankets)?: None Difficulty moving from lying on back to sitting on the side of the bed? : None   Help needed moving to and from a bed to chair (including a wheelchair)?: None Help needed walking in hospital room?: A Little Help needed climbing 3-5 steps with a railing? : A Little 6 Click Score: 18    End of Session   Activity Tolerance: Patient tolerated treatment well Patient left: in bed;with call bell/phone within reach        Time: 0937-1013 PT Time Calculation (min) (ACUTE ONLY): 36 min   Charges:   PT Evaluation $PT Eval Moderate Complexity: 1 Mod PT Treatments $Gait Training: 8-22 mins          Weston Anna,  PT Acute Rehabilitation Services Pager: [phone] Office: [phone]

## 2018-07-25 ENCOUNTER — Telehealth: Payer: Self-pay | Admitting: Cardiovascular Disease

## 2018-07-25 NOTE — Telephone Encounter (Signed)
New Message   Patient is calling because he was just recently discharged from the hospital. He said that he was told to follow up with his cardiologist because his pacemaker acted weird why he was in the hospital. He said that his HR rate was low. Now he is having bowel movements about every three minutes. Therefore he is not sure what Dr. Sallyanne Kuster wants him to do. Please call to discuss.

## 2018-07-25 NOTE — Telephone Encounter (Signed)
Patient states he was discharged from the hospital yesterday. He reports his heart rate was "all over the place" during his stay but it was back to normal when he was discharged. Patient states he has not done his remote pacemaker check that was due on 07/22/18 due to hospitalization at the time. Encouraged him to go ahead and do that today so it can be reviewed. Patient completed this while we were on the phone.   Patient reports frequent diarrhea every few minutes. Explained to him that is expected with him taking lactulose three times daily. Patient had no further complaints. Reminded him of his follow up appointment with his PCP on 08/15/18 and advised him to contact our office with any further questions. Patient verbalized understanding.

## 2018-07-25 NOTE — Telephone Encounter (Signed)
Remote transmission received. Presenting rhythm: ApVs w/PACs. 0.7% AT/AF burden, last 07/21/18 x 74hrs, no OAC d/t bleed risk. (1) new VHR episode on 10/7 x14sec - AF/RVR. Stable lead measurements. Normal device function.  Will add to the remote schedule for processing.

## 2018-07-28 ENCOUNTER — Ambulatory Visit (INDEPENDENT_AMBULATORY_CARE_PROVIDER_SITE_OTHER): Payer: Medicare Other | Admitting: *Deleted

## 2018-07-28 DIAGNOSIS — I480 Paroxysmal atrial fibrillation: Secondary | ICD-10-CM

## 2018-07-29 NOTE — Progress Notes (Signed)
Remote pacemaker transmission.   

## 2018-07-31 ENCOUNTER — Encounter: Payer: Self-pay | Admitting: Cardiology

## 2018-08-15 ENCOUNTER — Ambulatory Visit (INDEPENDENT_AMBULATORY_CARE_PROVIDER_SITE_OTHER): Payer: Medicare Other | Admitting: Family Medicine

## 2018-08-15 VITALS — BP 126/80 | HR 80 | Temp 97.9°F | Wt 213.0 lb

## 2018-08-15 DIAGNOSIS — Z0000 Encounter for general adult medical examination without abnormal findings: Secondary | ICD-10-CM

## 2018-08-15 DIAGNOSIS — Z23 Encounter for immunization: Secondary | ICD-10-CM | POA: Diagnosis not present

## 2018-08-15 DIAGNOSIS — E669 Obesity, unspecified: Secondary | ICD-10-CM

## 2018-08-15 DIAGNOSIS — K8000 Calculus of gallbladder with acute cholecystitis without obstruction: Secondary | ICD-10-CM

## 2018-08-15 DIAGNOSIS — I10 Essential (primary) hypertension: Secondary | ICD-10-CM | POA: Diagnosis not present

## 2018-08-15 DIAGNOSIS — B182 Chronic viral hepatitis C: Secondary | ICD-10-CM

## 2018-08-15 DIAGNOSIS — E785 Hyperlipidemia, unspecified: Secondary | ICD-10-CM | POA: Diagnosis not present

## 2018-08-15 DIAGNOSIS — L0232 Furuncle of buttock: Secondary | ICD-10-CM

## 2018-08-15 DIAGNOSIS — K7469 Other cirrhosis of liver: Secondary | ICD-10-CM

## 2018-08-15 MED ORDER — CLINDAMYCIN HCL 300 MG PO CAPS: 300.0000 mg | ORAL_CAPSULE | Freq: Four times a day (QID) | ORAL | 0 refills | Status: AC

## 2018-08-15 NOTE — Progress Notes (Signed)
Subjective: Chief Complaint  Patient presents with  . Hospitalization Follow-up    gall stones  . Rectal Pain    boil in between cheeks     HPI: Hunter Carter is a 67 y.o. presenting to clinic today to discuss the following:  Hospital Follow Up Patient presents to the clinic today due to a hospital follow up due to cholecystitis which was managed non-operatively due to his risk of morbidity being at 80% if he had the surgery. He has finished his course of antibiotics and states his RUQ pain has resolved. He is able to eat but is having frequent diarrhea and has to wear adult diapers due to frequent bowel movements that are difficult to control. He denies any fever, chills, nausea, vomiting, abdominal pain, or difficulty eating.  Boil Patient states he has a painful boil on the inside of his "backside" that is painful. He states it makes it difficult to wipe and get comfortable while sitting down but otherwise is not bothering him.  Health Maintenance: none     ROS noted in HPI.   Past Medical, Surgical, Social, and Family History Reviewed & Updated per EMR.   Pertinent Historical Findings include:   Social History   Tobacco Use  Smoking Status Former Smoker  . Packs/day: 0.25  . Years: 41.00  . Pack years: 10.25  . Types: Cigarettes  . Last attempt to quit: 11/15/2017  . Years since quitting: 0.7  Smokeless Tobacco Never Used    Objective: BP 126/80 (BP Location: Left Arm, Patient Position: Sitting, Cuff Size: Normal)   Pulse 80   Temp 97.9 F (36.6 C) (Oral)   Wt 213 lb (96.6 kg)   SpO2 97%   BMI 34.38 kg/m  Vitals and nursing notes reviewed  Physical Exam Gen: Alert and Oriented x 3, NAD HEENT: Normocephalic, atraumatic, PERRLA, EOMI CV: RRR, no murmurs, normal S1, S2 split Resp: CTAB, no wheezing, rales, or rhonchi, comfortable work of breathing Abd: distended, mildly tender in the LLQ, soft, no RUQ tenderness, +bs in all four quadrants,  hepatomegaly Ext: no clubbing, cyanosis, or edema, no asterixis Neuro: No gross deficits Skin: warm, dry, intact, no rashes  LABS: CBC Latest Ref Rng & Units 08/15/2018 07/24/2018 07/22/2018  WBC 3.4 - 10.8 x10E3/uL 4.7 8.9 11.6(H)  Hemoglobin 13.0 - 17.7 g/dL 13.0 13.9 14.2  Hematocrit 37.5 - 51.0 % 35.2(L) 42.0 42.0  Platelets 150 - 450 x10E3/uL 57(LL) 53(L) 47(L)    Assessment/Plan:  Boil of buttock Open and draining at home but no active drainage seen today.  - Clindamycin 300mg  4 times per day for 7 days to cover for MRSA - Home health to keep area clean and dry with dressings as patient cannot effectively do this himself - Sitz baths to help relieve discomfort - Follow up with me after he finishes ABX if no improvement   Calculus of gallbladder with acute cholecystitis Improved. Stable. Patient was hospitalized and treated non-operatively due to risk of surgery for him being too high given his risk factors of cirrohsis. I did discuss with patient this could return and if so he should present to the ED again. - Monitor and follow up with me as needed. - Could be a candidate for percutaneous cholecystostomy if it continues to recur but given patient thrombocytopenia even this would be a high risk procedure and not preferable to ABX.  I did advise patient to follow up with Hepatologist for his frequency diarrhea to see if  it would be possible to adjust his Lactulose to get better control of his diarrhea without causing him to once again to into hepatic encephalopathy.  PATIENT EDUCATION PROVIDED: See AVS    Diagnosis and plan along with any newly prescribed medication(s) were discussed in detail with this patient today. The patient verbalized understanding and agreed with the plan. Patient advised if symptoms worsen return to clinic or ER.   Health Maintainance: None performed today   Orders Placed This Encounter  Procedures  . Varicella-zoster vaccine subcutaneous  . Flu  vaccine HIGH DOSE PF  . Pneumococcal conjugate vaccine 13-valent  . Hemoglobin A1c  . CBC with Differential  . Comprehensive metabolic panel  . TSH  . Lipid Panel  . Ambulatory referral to Home Health    Referral Priority:   Routine    Referral Type:   Home Health Care    Referral Reason:   Specialty Services Required    Requested Specialty:   [place]    Number of Visits Requested:   1    Meds ordered this encounter  Medications  . clindamycin (CLEOCIN) 300 MG capsule    Sig: Take 1 capsule (300 mg total) by mouth 4 (four) times daily for 7 days.    Dispense:  28 capsule    Refill:  0     Tim Garlan Fillers, DO 08/15/2018, 9:02 AM PGY-2 [place]

## 2018-08-15 NOTE — Patient Instructions (Addendum)
It was great to see you today! Thank you for letting me participate in your care!  Today, we discussed your recent hospitalization due to your gallbladder that was managed with medications. I am glad you are feeling better and are recovering. If you begin to feel the pain again and/or have a fever I would go back to the hospital.  For your boil it will be important to keep it clean and dry. I will send in orders via home health and I have started you on a medication called Clindamycin. Please take it a prescribed for a total of 7 days.  For you cough take Robitussin DM at night time and if it does not improve please return to the clinic.  For your diarrhea I would like you to follow up with your hepatoligist, Dr. Caryl Pina.   Be well, Harolyn Rutherford, DO PGY-2, [facility]

## 2018-08-16 LAB — COMPREHENSIVE METABOLIC PANEL
ALBUMIN: 2.3 g/dL — AB (ref 3.6–4.8)
ALK PHOS: 123 IU/L — AB (ref 39–117)
ALT: 15 IU/L (ref 0–44)
AST: 36 IU/L (ref 0–40)
Albumin/Globulin Ratio: 0.5 — ABNORMAL LOW (ref 1.2–2.2)
BILIRUBIN TOTAL: 2.1 mg/dL — AB (ref 0.0–1.2)
BUN / CREAT RATIO: 9 — AB (ref 10–24)
BUN: 8 mg/dL (ref 8–27)
CHLORIDE: 104 mmol/L (ref 96–106)
CO2: 24 mmol/L (ref 20–29)
Calcium: 7.8 mg/dL — ABNORMAL LOW (ref 8.6–10.2)
Creatinine, Ser: 0.85 mg/dL (ref 0.76–1.27)
GFR calc Af Amer: 106 mL/min/{1.73_m2} (ref >59)
GFR calc non Af Amer: 91 mL/min/{1.73_m2} (ref >59)
Globulin, Total: 4.8 g/dL — ABNORMAL HIGH (ref 1.5–4.5)
Glucose: 85 mg/dL (ref 65–99)
POTASSIUM: 3.8 mmol/L (ref 3.5–5.2)
Sodium: 137 mmol/L (ref 134–144)
Total Protein: 7.1 g/dL (ref 6.0–8.5)

## 2018-08-16 LAB — CBC WITH DIFFERENTIAL/PLATELET
Basophils Absolute: 0.1 10*3/uL (ref 0.0–0.2)
Basos: 1 %
EOS (ABSOLUTE): 0.2 10*3/uL (ref 0.0–0.4)
EOS: 5 %
HEMOGLOBIN: 13 g/dL (ref 13.0–17.7)
Hematocrit: 35.2 % — ABNORMAL LOW (ref 37.5–51.0)
Immature Grans (Abs): 0 10*3/uL (ref 0.0–0.1)
Immature Granulocytes: 1 %
LYMPHS ABS: 1.5 10*3/uL (ref 0.7–3.1)
Lymphs: 33 %
MCH: 33.7 pg — AB (ref 26.6–33.0)
MCHC: 36.9 g/dL — AB (ref 31.5–35.7)
MCV: 91 fL (ref 79–97)
MONOS ABS: 1 10*3/uL — AB (ref 0.1–0.9)
Monocytes: 20 %
NEUTROS PCT: 40 %
Neutrophils Absolute: 1.9 10*3/uL (ref 1.4–7.0)
Platelets: 57 10*3/uL — CL (ref 150–450)
RBC: 3.86 x10E6/uL — AB (ref 4.14–5.80)
RDW: 12.5 % (ref 12.3–15.4)
WBC: 4.7 10*3/uL (ref 3.4–10.8)

## 2018-08-16 LAB — LIPID PANEL
CHOLESTEROL TOTAL: 64 mg/dL — AB (ref 100–199)
Chol/HDL Ratio: 2.2 ratio (ref 0.0–5.0)
HDL: 29 mg/dL — ABNORMAL LOW (ref >39)
LDL CALC: 23 mg/dL (ref 0–99)
TRIGLYCERIDES: 58 mg/dL (ref 0–149)
VLDL CHOLESTEROL CAL: 12 mg/dL (ref 5–40)

## 2018-08-16 LAB — HEMOGLOBIN A1C
ESTIMATED AVERAGE GLUCOSE: 94 mg/dL
HEMOGLOBIN A1C: 4.9 % (ref 4.8–5.6)

## 2018-08-16 LAB — TSH: TSH: 0.847 u[IU]/mL (ref 0.450–4.500)

## 2018-08-20 DIAGNOSIS — K810 Acute cholecystitis: Secondary | ICD-10-CM | POA: Insufficient documentation

## 2018-08-20 DIAGNOSIS — L0232 Furuncle of buttock: Secondary | ICD-10-CM | POA: Insufficient documentation

## 2018-08-20 NOTE — Assessment & Plan Note (Signed)
Improved. Stable. Patient was hospitalized and treated non-operatively due to risk of surgery for him being too high given his risk factors of cirrohsis. I did discuss with patient this could return and if so he should present to the ED again. - Monitor and follow up with me as needed. - Could be a candidate for percutaneous cholecystostomy if it continues to recur but given patient thrombocytopenia even this would be a high risk procedure and not preferable to ABX.

## 2018-08-20 NOTE — Assessment & Plan Note (Addendum)
Open and draining at home but no active drainage seen today.  - Clindamycin 300mg  4 times per day for 7 days to cover for MRSA - Home health to keep area clean and dry with dressings as patient cannot effectively do this himself - Sitz baths to help relieve discomfort - Follow up with me after he finishes ABX if no improvement

## 2018-08-22 LAB — CUP PACEART REMOTE DEVICE CHECK
Battery Impedance: 397 Ohm
Battery Remaining Longevity: 113 mo
Brady Statistic AS VP Percent: 0 %
Brady Statistic AS VS Percent: 36 %
Implantable Lead Implant Date: 20110614
Implantable Lead Location: 753859
Implantable Lead Model: 5076
Implantable Pulse Generator Implant Date: 20110614
Lead Channel Pacing Threshold Amplitude: 0.75 V
Lead Channel Pacing Threshold Amplitude: 0.75 V
Lead Channel Pacing Threshold Pulse Width: 0.4 ms
Lead Channel Setting Pacing Amplitude: 1.5 V
Lead Channel Setting Pacing Amplitude: 2 V
Lead Channel Setting Sensing Sensitivity: 2.8 mV
MDC IDC LEAD IMPLANT DT: 20050527
MDC IDC LEAD LOCATION: 753860
MDC IDC MSMT BATTERY VOLTAGE: 2.78 V
MDC IDC MSMT LEADCHNL RA IMPEDANCE VALUE: 482 Ohm
MDC IDC MSMT LEADCHNL RA PACING THRESHOLD PULSEWIDTH: 0.4 ms
MDC IDC MSMT LEADCHNL RV IMPEDANCE VALUE: 574 Ohm
MDC IDC SESS DTM: 20191011154409
MDC IDC SET LEADCHNL RV PACING PULSEWIDTH: 0.4 ms
MDC IDC STAT BRADY AP VP PERCENT: 1 %
MDC IDC STAT BRADY AP VS PERCENT: 63 %

## 2018-08-30 ENCOUNTER — Inpatient Hospital Stay (HOSPITAL_COMMUNITY)
Admission: EM | Admit: 2018-08-30 | Discharge: 2018-09-10 | DRG: 872 | Disposition: A | Discharge status: Skilled Nursing Facility | Payer: Medicare Other | Attending: Family Medicine | Admitting: Family Medicine

## 2018-08-30 ENCOUNTER — Encounter (HOSPITAL_COMMUNITY): Payer: Self-pay | Admitting: Emergency Medicine

## 2018-08-30 ENCOUNTER — Emergency Department (HOSPITAL_COMMUNITY): Payer: Medicare Other

## 2018-08-30 ENCOUNTER — Inpatient Hospital Stay (HOSPITAL_COMMUNITY): Payer: Medicare Other

## 2018-08-30 DIAGNOSIS — A409 Streptococcal sepsis, unspecified: Secondary | ICD-10-CM

## 2018-08-30 DIAGNOSIS — I471 Supraventricular tachycardia: Secondary | ICD-10-CM | POA: Diagnosis not present

## 2018-08-30 DIAGNOSIS — D689 Coagulation defect, unspecified: Secondary | ICD-10-CM | POA: Diagnosis not present

## 2018-08-30 DIAGNOSIS — E871 Hypo-osmolality and hyponatremia: Secondary | ICD-10-CM | POA: Diagnosis present

## 2018-08-30 DIAGNOSIS — T8579XS Infection and inflammatory reaction due to other internal prosthetic devices, implants and grafts, sequela: Secondary | ICD-10-CM | POA: Diagnosis not present

## 2018-08-30 DIAGNOSIS — D6959 Other secondary thrombocytopenia: Secondary | ICD-10-CM | POA: Diagnosis present

## 2018-08-30 DIAGNOSIS — I700 Atherosclerosis of aorta: Secondary | ICD-10-CM | POA: Diagnosis not present

## 2018-08-30 DIAGNOSIS — I480 Paroxysmal atrial fibrillation: Secondary | ICD-10-CM | POA: Diagnosis not present

## 2018-08-30 DIAGNOSIS — K7460 Unspecified cirrhosis of liver: Secondary | ICD-10-CM | POA: Diagnosis present

## 2018-08-30 DIAGNOSIS — R600 Localized edema: Secondary | ICD-10-CM | POA: Diagnosis not present

## 2018-08-30 DIAGNOSIS — R1312 Dysphagia, oropharyngeal phase: Secondary | ICD-10-CM | POA: Diagnosis not present

## 2018-08-30 DIAGNOSIS — E875 Hyperkalemia: Secondary | ICD-10-CM | POA: Diagnosis present

## 2018-08-30 DIAGNOSIS — R609 Edema, unspecified: Secondary | ICD-10-CM | POA: Diagnosis not present

## 2018-08-30 DIAGNOSIS — Z434 Encounter for attention to other artificial openings of digestive tract: Secondary | ICD-10-CM | POA: Diagnosis not present

## 2018-08-30 DIAGNOSIS — R2689 Other abnormalities of gait and mobility: Secondary | ICD-10-CM | POA: Diagnosis not present

## 2018-08-30 DIAGNOSIS — Z951 Presence of aortocoronary bypass graft: Secondary | ICD-10-CM

## 2018-08-30 DIAGNOSIS — K819 Cholecystitis, unspecified: Secondary | ICD-10-CM | POA: Diagnosis not present

## 2018-08-30 DIAGNOSIS — R140 Abdominal distension (gaseous): Secondary | ICD-10-CM | POA: Diagnosis not present

## 2018-08-30 DIAGNOSIS — I110 Hypertensive heart disease with heart failure: Secondary | ICD-10-CM | POA: Diagnosis present

## 2018-08-30 DIAGNOSIS — K7290 Hepatic failure, unspecified without coma: Secondary | ICD-10-CM | POA: Diagnosis not present

## 2018-08-30 DIAGNOSIS — I441 Atrioventricular block, second degree: Secondary | ICD-10-CM | POA: Diagnosis present

## 2018-08-30 DIAGNOSIS — I2510 Atherosclerotic heart disease of native coronary artery without angina pectoris: Secondary | ICD-10-CM | POA: Diagnosis present

## 2018-08-30 DIAGNOSIS — Z950 Presence of cardiac pacemaker: Secondary | ICD-10-CM

## 2018-08-30 DIAGNOSIS — I361 Nonrheumatic tricuspid (valve) insufficiency: Secondary | ICD-10-CM | POA: Diagnosis not present

## 2018-08-30 DIAGNOSIS — I5032 Chronic diastolic (congestive) heart failure: Secondary | ICD-10-CM | POA: Diagnosis not present

## 2018-08-30 DIAGNOSIS — I25810 Atherosclerosis of coronary artery bypass graft(s) without angina pectoris: Secondary | ICD-10-CM | POA: Diagnosis not present

## 2018-08-30 DIAGNOSIS — Z79899 Other long term (current) drug therapy: Secondary | ICD-10-CM

## 2018-08-30 DIAGNOSIS — I252 Old myocardial infarction: Secondary | ICD-10-CM

## 2018-08-30 DIAGNOSIS — K659 Peritonitis, unspecified: Secondary | ICD-10-CM | POA: Diagnosis not present

## 2018-08-30 DIAGNOSIS — I4892 Unspecified atrial flutter: Secondary | ICD-10-CM | POA: Diagnosis present

## 2018-08-30 DIAGNOSIS — R9431 Abnormal electrocardiogram [ECG] [EKG]: Secondary | ICD-10-CM | POA: Diagnosis not present

## 2018-08-30 DIAGNOSIS — F1010 Alcohol abuse, uncomplicated: Secondary | ICD-10-CM | POA: Diagnosis present

## 2018-08-30 DIAGNOSIS — R0602 Shortness of breath: Secondary | ICD-10-CM | POA: Diagnosis not present

## 2018-08-30 DIAGNOSIS — B182 Chronic viral hepatitis C: Secondary | ICD-10-CM | POA: Diagnosis not present

## 2018-08-30 DIAGNOSIS — K769 Liver disease, unspecified: Secondary | ICD-10-CM | POA: Diagnosis present

## 2018-08-30 DIAGNOSIS — K8000 Calculus of gallbladder with acute cholecystitis without obstruction: Secondary | ICD-10-CM | POA: Diagnosis not present

## 2018-08-30 DIAGNOSIS — E785 Hyperlipidemia, unspecified: Secondary | ICD-10-CM | POA: Diagnosis present

## 2018-08-30 DIAGNOSIS — R109 Unspecified abdominal pain: Secondary | ICD-10-CM | POA: Diagnosis not present

## 2018-08-30 DIAGNOSIS — A419 Sepsis, unspecified organism: Principal | ICD-10-CM

## 2018-08-30 DIAGNOSIS — Z1611 Resistance to penicillins: Secondary | ICD-10-CM | POA: Diagnosis not present

## 2018-08-30 DIAGNOSIS — D684 Acquired coagulation factor deficiency: Secondary | ICD-10-CM | POA: Diagnosis not present

## 2018-08-30 DIAGNOSIS — Z87891 Personal history of nicotine dependence: Secondary | ICD-10-CM | POA: Diagnosis not present

## 2018-08-30 DIAGNOSIS — I340 Nonrheumatic mitral (valve) insufficiency: Secondary | ICD-10-CM | POA: Diagnosis not present

## 2018-08-30 DIAGNOSIS — R112 Nausea with vomiting, unspecified: Secondary | ICD-10-CM | POA: Diagnosis not present

## 2018-08-30 DIAGNOSIS — N189 Chronic kidney disease, unspecified: Secondary | ICD-10-CM | POA: Diagnosis not present

## 2018-08-30 DIAGNOSIS — R188 Other ascites: Secondary | ICD-10-CM | POA: Diagnosis present

## 2018-08-30 DIAGNOSIS — J449 Chronic obstructive pulmonary disease, unspecified: Secondary | ICD-10-CM | POA: Diagnosis present

## 2018-08-30 DIAGNOSIS — K7469 Other cirrhosis of liver: Secondary | ICD-10-CM | POA: Diagnosis not present

## 2018-08-30 DIAGNOSIS — K828 Other specified diseases of gallbladder: Secondary | ICD-10-CM | POA: Diagnosis not present

## 2018-08-30 DIAGNOSIS — N183 Chronic kidney disease, stage 3 (moderate): Secondary | ICD-10-CM | POA: Diagnosis not present

## 2018-08-30 DIAGNOSIS — K810 Acute cholecystitis: Secondary | ICD-10-CM

## 2018-08-30 DIAGNOSIS — K717 Toxic liver disease with fibrosis and cirrhosis of liver: Secondary | ICD-10-CM | POA: Diagnosis not present

## 2018-08-30 DIAGNOSIS — N179 Acute kidney failure, unspecified: Secondary | ICD-10-CM | POA: Diagnosis present

## 2018-08-30 DIAGNOSIS — K8020 Calculus of gallbladder without cholecystitis without obstruction: Secondary | ICD-10-CM | POA: Diagnosis not present

## 2018-08-30 DIAGNOSIS — R2681 Unsteadiness on feet: Secondary | ICD-10-CM | POA: Diagnosis not present

## 2018-08-30 DIAGNOSIS — R05 Cough: Secondary | ICD-10-CM | POA: Diagnosis not present

## 2018-08-30 DIAGNOSIS — K7031 Alcoholic cirrhosis of liver with ascites: Secondary | ICD-10-CM | POA: Diagnosis not present

## 2018-08-30 DIAGNOSIS — E876 Hypokalemia: Secondary | ICD-10-CM | POA: Diagnosis present

## 2018-08-30 DIAGNOSIS — Z66 Do not resuscitate: Secondary | ICD-10-CM | POA: Diagnosis present

## 2018-08-30 DIAGNOSIS — Z7189 Other specified counseling: Secondary | ICD-10-CM | POA: Diagnosis not present

## 2018-08-30 DIAGNOSIS — R6520 Severe sepsis without septic shock: Secondary | ICD-10-CM | POA: Diagnosis not present

## 2018-08-30 DIAGNOSIS — M6281 Muscle weakness (generalized): Secondary | ICD-10-CM | POA: Diagnosis not present

## 2018-08-30 DIAGNOSIS — K7030 Alcoholic cirrhosis of liver without ascites: Secondary | ICD-10-CM | POA: Diagnosis not present

## 2018-08-30 DIAGNOSIS — T501X5A Adverse effect of loop [high-ceiling] diuretics, initial encounter: Secondary | ICD-10-CM | POA: Diagnosis not present

## 2018-08-30 DIAGNOSIS — Z515 Encounter for palliative care: Secondary | ICD-10-CM | POA: Diagnosis not present

## 2018-08-30 DIAGNOSIS — D696 Thrombocytopenia, unspecified: Secondary | ICD-10-CM | POA: Diagnosis not present

## 2018-08-30 HISTORY — PX: IR PERC CHOLECYSTOSTOMY: IMG2326

## 2018-08-30 HISTORY — PX: IR PARACENTESIS: IMG2679

## 2018-08-30 LAB — CBC WITH DIFFERENTIAL/PLATELET
Abs Immature Granulocytes: 0.08 10*3/uL — ABNORMAL HIGH (ref 0.00–0.07)
BASOS PCT: 0 %
Basophils Absolute: 0 10*3/uL (ref 0.0–0.1)
EOS ABS: 0 10*3/uL (ref 0.0–0.5)
Eosinophils Relative: 0 %
HEMATOCRIT: 44.6 % (ref 39.0–52.0)
Hemoglobin: 14.4 g/dL (ref 13.0–17.0)
IMMATURE GRANULOCYTES: 1 %
LYMPHS ABS: 1.6 10*3/uL (ref 0.7–4.0)
Lymphocytes Relative: 13 %
MCH: 32.2 pg (ref 26.0–34.0)
MCHC: 32.3 g/dL (ref 30.0–36.0)
MCV: 99.8 fL (ref 80.0–100.0)
Monocytes Absolute: 1 10*3/uL (ref 0.1–1.0)
Monocytes Relative: 8 %
NEUTROS PCT: 78 %
NRBC: 0 % (ref 0.0–0.2)
Neutro Abs: 9.7 10*3/uL — ABNORMAL HIGH (ref 1.7–7.7)
PLATELETS: 61 10*3/uL — AB (ref 150–400)
RBC: 4.47 MIL/uL (ref 4.22–5.81)
RDW: 14 % (ref 11.5–15.5)
WBC: 12.5 10*3/uL — ABNORMAL HIGH (ref 4.0–10.5)

## 2018-08-30 LAB — COMPREHENSIVE METABOLIC PANEL
ALT: 17 U/L (ref 0–44)
ANION GAP: 8 (ref 5–15)
AST: 41 U/L (ref 15–41)
Albumin: 1.7 g/dL — ABNORMAL LOW (ref 3.5–5.0)
Alkaline Phosphatase: 92 U/L (ref 38–126)
BUN: 8 mg/dL (ref 8–23)
CO2: 18 mmol/L — ABNORMAL LOW (ref 22–32)
Calcium: 7.7 mg/dL — ABNORMAL LOW (ref 8.9–10.3)
Chloride: 105 mmol/L (ref 98–111)
Creatinine, Ser: 1.47 mg/dL — ABNORMAL HIGH (ref 0.61–1.24)
GFR calc Af Amer: 56 mL/min — ABNORMAL LOW (ref >60)
GFR calc non Af Amer: 48 mL/min — ABNORMAL LOW (ref >60)
Glucose, Bld: 112 mg/dL — ABNORMAL HIGH (ref 70–99)
POTASSIUM: 4.3 mmol/L (ref 3.5–5.1)
SODIUM: 131 mmol/L — AB (ref 135–145)
TOTAL PROTEIN: 8.5 g/dL — AB (ref 6.5–8.1)
Total Bilirubin: 3.9 mg/dL — ABNORMAL HIGH (ref 0.3–1.2)

## 2018-08-30 LAB — I-STAT CG4 LACTIC ACID, ED
LACTIC ACID, VENOUS: 4.3 mmol/L — AB (ref 0.5–1.9)
LACTIC ACID, VENOUS: 3.26 mmol/L — AB (ref 0.5–1.9)
Lactic Acid, Venous: 3.64 mmol/L (ref 0.5–1.9)
Lactic Acid, Venous: 3.43 mmol/L (ref 0.5–1.9)

## 2018-08-30 LAB — URINALYSIS, ROUTINE W REFLEX MICROSCOPIC
Bacteria, UA: NONE SEEN
GLUCOSE, UA: NEGATIVE mg/dL
Ketones, ur: NEGATIVE mg/dL
Leukocytes, UA: NEGATIVE
NITRITE: NEGATIVE
Protein, ur: NEGATIVE mg/dL
RBC / HPF: >50 RBC/hpf — ABNORMAL HIGH (ref 0–5)
pH: 5 (ref 5.0–8.0)

## 2018-08-30 LAB — PROTIME-INR
INR: 2.08
PROTHROMBIN TIME: 23.1 s — AB (ref 11.4–15.2)

## 2018-08-30 LAB — MRSA PCR SCREENING: MRSA by PCR: NEGATIVE

## 2018-08-30 LAB — LIPASE, BLOOD: LIPASE: 28 U/L (ref 11–51)

## 2018-08-30 LAB — AMMONIA: Ammonia: 59 umol/L — ABNORMAL HIGH (ref 9–35)

## 2018-08-30 LAB — TROPONIN I

## 2018-08-30 LAB — TYPE AND SCREEN
ABO/RH(D): A POS
Antibody Screen: NEGATIVE

## 2018-08-30 LAB — BRAIN NATRIURETIC PEPTIDE: B Natriuretic Peptide: 133 pg/mL — ABNORMAL HIGH (ref 0.0–100.0)

## 2018-08-30 MED ORDER — IOHEXOL 300 MG/ML  SOLN
100.0000 mL | Freq: Once | INTRAMUSCULAR | Status: AC | PRN
  Administered 2018-08-30: 100 mL via INTRAVENOUS

## 2018-08-30 MED ORDER — MORPHINE SULFATE (PF) 2 MG/ML IV SOLN
2.0000 mg | INTRAVENOUS | Status: DC | PRN
  Administered 2018-08-30 – 2018-09-01 (×8): 2 mg via INTRAVENOUS
  Filled 2018-08-30 (×8): qty 1

## 2018-08-30 MED ORDER — ACETAMINOPHEN 650 MG RE SUPP
650.0000 mg | Freq: Once | RECTAL | Status: AC
  Administered 2018-08-30: 650 mg via RECTAL
  Filled 2018-08-30: qty 1

## 2018-08-30 MED ORDER — SODIUM CHLORIDE 0.9 % IV BOLUS
1000.0000 mL | Freq: Once | INTRAVENOUS | Status: DC
  Administered 2018-08-30: 1000 mL via INTRAVENOUS

## 2018-08-30 MED ORDER — SODIUM CHLORIDE 0.9 % IV BOLUS
1000.0000 mL | Freq: Once | INTRAVENOUS | Status: AC
  Administered 2018-08-30: 1000 mL via INTRAVENOUS

## 2018-08-30 MED ORDER — FENTANYL CITRATE (PF) 100 MCG/2ML IJ SOLN
50.0000 ug | Freq: Once | INTRAMUSCULAR | Status: AC
  Administered 2018-08-30: 50 ug via INTRAVENOUS
  Filled 2018-08-30: qty 2

## 2018-08-30 MED ORDER — NOREPINEPHRINE 4 MG/250ML-% IV SOLN: 2.0000 ug/min | INTRAVENOUS | Status: DC

## 2018-08-30 MED ORDER — SODIUM CHLORIDE 0.9 % IV SOLN
250.0000 mL | INTRAVENOUS | Status: DC
  Administered 2018-08-30: 250 mL via INTRAVENOUS

## 2018-08-30 MED ORDER — MIDAZOLAM HCL 2 MG/2ML IJ SOLN
INTRAMUSCULAR | Status: AC | PRN
  Administered 2018-08-30: 1 mg via INTRAVENOUS

## 2018-08-30 MED ORDER — FENTANYL CITRATE (PF) 100 MCG/2ML IJ SOLN
INTRAMUSCULAR | Status: AC | PRN
  Administered 2018-08-30 (×2): 25 ug via INTRAVENOUS

## 2018-08-30 MED ORDER — MIDAZOLAM HCL 2 MG/2ML IJ SOLN
INTRAMUSCULAR | Status: AC
  Filled 2018-08-30: qty 2

## 2018-08-30 MED ORDER — FENTANYL CITRATE (PF) 100 MCG/2ML IJ SOLN
INTRAMUSCULAR | Status: AC | PRN
  Administered 2018-08-30: 25 ug via INTRAVENOUS

## 2018-08-30 MED ORDER — FENTANYL CITRATE (PF) 100 MCG/2ML IJ SOLN
INTRAMUSCULAR | Status: AC
  Filled 2018-08-30: qty 2

## 2018-08-30 MED ORDER — IOPAMIDOL (ISOVUE-370) INJECTION 76%
INTRAVENOUS | Status: AC
  Administered 2018-08-30: 100 mL
  Filled 2018-08-30: qty 100

## 2018-08-30 MED ORDER — PIPERACILLIN-TAZOBACTAM 3.375 G IVPB
3.3750 g | Freq: Three times a day (TID) | INTRAVENOUS | Status: DC
  Administered 2018-08-30 – 2018-09-01 (×6): 3.375 g via INTRAVENOUS
  Filled 2018-08-30 (×6): qty 50

## 2018-08-30 MED ORDER — METRONIDAZOLE IN NACL 5-0.79 MG/ML-% IV SOLN
500.0000 mg | Freq: Three times a day (TID) | INTRAVENOUS | Status: DC
  Administered 2018-08-30 – 2018-09-02 (×9): 500 mg via INTRAVENOUS
  Filled 2018-08-30 (×9): qty 100

## 2018-08-30 MED ORDER — BENZONATATE 100 MG PO CAPS
200.0000 mg | ORAL_CAPSULE | Freq: Three times a day (TID) | ORAL | Status: DC | PRN
  Administered 2018-08-31 – 2018-09-03 (×2): 200 mg via ORAL
  Filled 2018-08-30 (×2): qty 2

## 2018-08-30 MED ORDER — VANCOMYCIN HCL IN DEXTROSE 750-5 MG/150ML-% IV SOLN
750.0000 mg | Freq: Two times a day (BID) | INTRAVENOUS | Status: DC
  Administered 2018-08-30 – 2018-09-02 (×7): 750 mg via INTRAVENOUS
  Filled 2018-08-30 (×7): qty 150

## 2018-08-30 MED ORDER — SODIUM CHLORIDE 0.9 % IV SOLN
2.0000 g | Freq: Once | INTRAVENOUS | Status: AC
  Administered 2018-08-30: 2 g via INTRAVENOUS
  Filled 2018-08-30: qty 2

## 2018-08-30 MED ORDER — SODIUM CHLORIDE 0.9 % IV SOLN: INTRAVENOUS | Status: AC

## 2018-08-30 MED ORDER — ONDANSETRON HCL 4 MG/2ML IJ SOLN
4.0000 mg | Freq: Once | INTRAMUSCULAR | Status: AC
  Administered 2018-08-30: 4 mg via INTRAVENOUS
  Filled 2018-08-30: qty 2

## 2018-08-30 MED ORDER — ACETAMINOPHEN 325 MG PO TABS: 650.0000 mg | ORAL_TABLET | Freq: Once | ORAL | Status: DC

## 2018-08-30 MED ORDER — LIDOCAINE HCL 1 % IJ SOLN
INTRAMUSCULAR | Status: AC
  Filled 2018-08-30: qty 20

## 2018-08-30 MED ORDER — MIDAZOLAM HCL 2 MG/2ML IJ SOLN
INTRAMUSCULAR | Status: AC | PRN
  Administered 2018-08-30: 0.5 mg via INTRAVENOUS

## 2018-08-30 MED ORDER — LIDOCAINE HCL 1 % IJ SOLN
INTRAMUSCULAR | Status: AC | PRN
  Administered 2018-08-30: 20 mL

## 2018-08-30 MED ORDER — IOPAMIDOL (ISOVUE-300) INJECTION 61%
INTRAVENOUS | Status: AC
  Administered 2018-08-30: 10 mL
  Filled 2018-08-30: qty 50

## 2018-08-30 MED ORDER — SODIUM CHLORIDE 0.9% IV SOLUTION
Freq: Once | INTRAVENOUS | Status: AC
  Administered 2018-08-30: 15:00:00 via INTRAVENOUS

## 2018-08-30 MED ORDER — ALBUTEROL SULFATE (2.5 MG/3ML) 0.083% IN NEBU
3.0000 mL | INHALATION_SOLUTION | RESPIRATORY_TRACT | Status: DC | PRN
  Administered 2018-09-01 – 2018-09-02 (×3): 3 mL via RESPIRATORY_TRACT
  Filled 2018-08-30 (×3): qty 3

## 2018-08-30 NOTE — ED Provider Notes (Addendum)
[facility] Provider Note   CSN: [id number] Arrival date & time: 08/30/18  7124     History   Chief Complaint Chief Complaint  Patient presents with  . Abdominal Pain    HPI Hunter Carter is a 67 y.o. male.  HPI   67 yo F with h/o hepatic encephalopathy, CAD, CHF, cirrhosis, chronic EtOH abuse here with RUQ and diffuse pain. Pt reports that over the past 2 weeks, he's had progressively worsening RUQ pain that is aching, throbbing, and now generalized. He's had associated nausea, vomiting, and diarrhea. The pain is worse with eating and palpation. No alleviating factors. No increased LE edema or swelling. No CP. Pain is similar to his recent admission for cholecystitis w/ ascites, managed empirically. He denies any fevers. No recent med changes.  Past Medical History:  Diagnosis Date  . Acute hepatic encephalopathy 10/10/2015   [name] 10/10/2015  . Arthritis    "joints ache" (10/10/2015)  . Bleeding stomach ulcer    "recently" (10/10/2015)  . CAD (coronary artery disease)   . CHF (congestive heart failure) ([place])   . Cirrhosis ([place])    due to HCV/notes 10/10/2015  . ETOH abuse   . GERD (gastroesophageal reflux disease)   . Heart block AV second degree    permanent pacemaker 02/2004  . Hepatitis C   . Hyperlipidemia   . Inferior MI ([place]) 2005  . Presence of permanent cardiac pacemaker   . RBBB   . Systemic hypertension   . Tubulovillous adenoma of colon   . Ventricular tachycardia ([place])    nonsustained    Patient Active Problem List   Diagnosis Date Noted  . Sepsis ([place]) 08/30/2018  . Boil of buttock 08/20/2018  . Calculus of gallbladder with acute cholecystitis 08/20/2018  . Abdominal pain 07/20/2018  . Acute hepatic encephalopathy 03/20/2018  . Gastroesophageal reflux disease 03/11/2018  . CAD of autologous vein bypass graft without angina 03/01/2018  . Healthcare maintenance 02/13/2018  . Skin lesion  02/13/2018  . Cirrhosis ([place]) 08/08/2016  . Paroxysmal atrial fibrillation ([place]) 01/05/2016  . Tobacco abuse 01/05/2016  . Hepatic encephalopathy ([place]) 10/10/2015  . Pain, joint, ankle, left 10/10/2015  . Thrombocytopathia ([place]) 10/10/2015  . HLD (hyperlipidemia) 10/10/2015  . Chronic diastolic congestive heart failure ([place]) 10/10/2015  . Altered mental status   . Increased ammonia level   . Leg swelling   . Encephalopathy acute 08/01/2015  . GI bleed 07/29/2015  . Acute GI bleeding 07/29/2015  . Acute respiratory failure ([place])   . Bleeding gastrointestinal   . Hemorrhagic shock ([place])   . CAD s/p CABG 2004 07/20/2013  . Pacemaker - dual chamber Medtronic 2011 07/20/2013  . Hepatitis C 07/20/2013  . Hyperlipidemia 07/20/2013  . PAT (paroxysmal atrial tachycardia) ([place]) 07/20/2013  . Obesity (BMI 30-39.9) 07/20/2013  . NSVT (nonsustained ventricular tachycardia) ([place]) 07/20/2013  . HTN (hypertension) 07/16/2013    Past Surgical History:  Procedure Laterality Date  . CARDIAC CATHETERIZATION  03/06/04   significant 3 vessel disease, totalled graft obtuse marginal  . CORONARY ARTERY BYPASS GRAFT  02/10/03   LIMA to LAD,SVG to first diagonal, SVT to OM1  . ESOPHAGOGASTRODUODENOSCOPY N/A 07/29/2015   Procedure: ESOPHAGOGASTRODUODENOSCOPY (EGD);  Surgeon: Clarene Essex, MD;  Location: [facility] ENDOSCOPY;  Service: Endoscopy;  Laterality: N/A;  . INSERT / REPLACE / REMOVE PACEMAKER  03/2010   Medtronic  . KNEE ARTHROSCOPY Left [phone]  . MEDIASTINAL EXPLORATION  02/10/2003  . NM MYOCAR PERF  WALL MOTION  04/05/09   low risk scan-abnormal-mod. perfusion defect basal inferoseptal,basal inferior,mid inferoseptal,mid inferior and apical inferior regions  . TOTAL KNEE ARTHROPLASTY Left   . US ECHOCARDIOGRAPHY  01/20/07   mild MR,mild to mod. TR,trace AI,mild PI        Home Medications    Prior to Admission medications   Medication Sig Start Date End Date Taking? Authorizing Provider    furosemide (LASIX) 40 MG tablet TAKE 1 TABLET BY MOUTH  DAILY AS NEEDED Patient taking differently: Take 40 mg by mouth daily. Make take an additional 40 mg if needed 07/15/18  Yes Lockamy, Timothy, DO  lactulose (CHRONULAC) 10 GM/15ML solution Take 45 mLs (30 g total) by mouth 2 (two) times daily. Titrate for 3-4 BM's/ day Patient taking differently: Take 30 g by mouth 3 (three) times daily. Titrate for 3-4 BM's/ day 03/22/18  Yes Sheikh, Omair Latif, DO  losartan (COZAAR) 25 MG tablet TAKE 1 TABLET BY MOUTH  DAILY Patient taking differently: Take 25 mg by mouth daily.  04/30/18  Yes Lockamy, Timothy, DO  metoprolol tartrate (LOPRESSOR) 50 MG tablet TAKE 1 TABLET BY MOUTH TWO  TIMES DAILY Patient taking differently: Take 50 mg by mouth daily.  07/15/18  Yes Lockamy, Timothy, DO  omeprazole (PRILOSEC) 20 MG capsule TAKE 1 CAPSULE BY MOUTH  DAILY Patient taking differently: Take 20 mg by mouth daily.  07/15/18  Yes Lockamy, Timothy, DO  PROAIR HFA 108 (90 Base) MCG/ACT inhaler USE 2 PUFFS BY MOUTH EVERY  4 HOURS AS NEEDED FOR  WHEEZING OR SHORTNESS OF  BREATH (OR COUGHING) Patient taking differently: Inhale 1-2 puffs into the lungs every 4 (four) hours as needed for wheezing.  06/20/18  Yes Lockamy, Timothy, DO  simvastatin (ZOCOR) 20 MG tablet TAKE 1 TABLET BY MOUTH  DAILY Patient taking differently: Take 20 mg by mouth daily at 6 PM.  07/15/18  Yes Nuala Alpha, DO    Family History Family History  Problem Relation Age of Onset  . Heart failure Mother   . Liver disease Father   . Heart failure Sister   . Hypertension Sister   . Hypertension Sister   . Colon cancer Neg Hx     Social History Social History   Tobacco Use  . Smoking status: Former Smoker    Packs/day: 0.25    Years: 41.00    Pack years: 10.25    Types: Cigarettes    Last attempt to quit: 11/15/2017    Years since quitting: 0.7  . Smokeless tobacco: Never Used  Substance Use Topics  . Alcohol use: Yes    Comment:  10/10/2015 "last drink was 2-3 months ago; h/o abuse"  . Drug use: Yes    Types: Marijuana    Comment: last use 2015     Allergies   Patient has no known allergies.   Review of Systems Review of Systems  Constitutional: Positive for chills and fatigue. Negative for fever.  HENT: Negative for congestion and rhinorrhea.   Eyes: Negative for visual disturbance.  Respiratory: Negative for cough, shortness of breath and wheezing.   Cardiovascular: Negative for chest pain and leg swelling.  Gastrointestinal: Positive for abdominal pain, diarrhea, nausea and vomiting.  Genitourinary: Negative for dysuria and flank pain.  Musculoskeletal: Negative for neck pain and neck stiffness.  Skin: Negative for rash and wound.  Allergic/Immunologic: Negative for immunocompromised state.  Neurological: Positive for weakness. Negative for syncope and headaches.  All other systems reviewed and are negative.  Physical Exam Updated Vital Signs BP 92/68   Pulse 69   Temp (!) 100.5 F (38.1 C) (Rectal)   Resp (!) 24   Ht 5\' 6"  (1.676 m)   Wt 96.6 kg   SpO2 96%   BMI 34.37 kg/m   Physical Exam  Constitutional: He is oriented to person, place, and time. He appears well-developed and well-nourished. He appears ill. No distress.  HENT:  Head: Normocephalic and atraumatic.  Eyes: Conjunctivae are normal.  Neck: Neck supple.  Cardiovascular: Normal rate, regular rhythm and normal heart sounds. Exam reveals no friction rub.  No murmur heard. Pulmonary/Chest: Effort normal and breath sounds normal. No respiratory distress. He has no wheezes. He has no rales.  Abdominal: Soft. Normal appearance. He exhibits no distension. There is generalized tenderness and tenderness in the right upper quadrant and periumbilical area. There is guarding. There is no rigidity and no rebound.  Musculoskeletal: He exhibits no edema.  Neurological: He is alert and oriented to person, place, and time. He exhibits  normal muscle tone.  Skin: Skin is warm. Capillary refill takes less than 2 seconds.  Psychiatric: He has a normal mood and affect.  Nursing note and vitals reviewed.    ED Treatments / Results  Labs (all labs ordered are listed, but only abnormal results are displayed) Labs Reviewed  CBC WITH DIFFERENTIAL/PLATELET - Abnormal; Notable for the following components:      Result Value   WBC 12.5 (*)    Platelets 61 (*)    Neutro Abs 9.7 (*)    Abs Immature Granulocytes 0.08 (*)    All other components within normal limits  COMPREHENSIVE METABOLIC PANEL - Abnormal; Notable for the following components:   Sodium 131 (*)    CO2 18 (*)    Glucose, Bld 112 (*)    Creatinine, Ser 1.47 (*)    Calcium 7.7 (*)    Total Protein 8.5 (*)    Albumin 1.7 (*)    Total Bilirubin 3.9 (*)    GFR calc non Af Amer 48 (*)    GFR calc Af Amer 56 (*)    All other components within normal limits  PROTIME-INR - Abnormal; Notable for the following components:   Prothrombin Time 23.1 (*)    All other components within normal limits  AMMONIA - Abnormal; Notable for the following components:   Ammonia 59 (*)    All other components within normal limits  URINALYSIS, ROUTINE W REFLEX MICROSCOPIC - Abnormal; Notable for the following components:   Color, Urine AMBER (*)    APPearance HAZY (*)    Specific Gravity, Urine >1.046 (*)    Hgb urine dipstick LARGE (*)    Bilirubin Urine SMALL (*)    RBC / HPF >50 (*)    All other components within normal limits  BRAIN NATRIURETIC PEPTIDE - Abnormal; Notable for the following components:   B Natriuretic Peptide 133.0 (*)    All other components within normal limits  I-STAT CG4 LACTIC ACID, ED - Abnormal; Notable for the following components:   Lactic Acid, Venous 4.30 (*)    All other components within normal limits  I-STAT CG4 LACTIC ACID, ED - Abnormal; Notable for the following components:   Lactic Acid, Venous 3.64 (*)    All other components within  normal limits  I-STAT CG4 LACTIC ACID, ED - Abnormal; Notable for the following components:   Lactic Acid, Venous 3.26 (*)    All other components within normal limits  CULTURE, BLOOD (ROUTINE X 2)  CULTURE, BLOOD (ROUTINE X 2)  URINE CULTURE  LIPASE, BLOOD  TROPONIN I  I-STAT CG4 LACTIC ACID, ED  PREPARE FRESH FROZEN PLASMA  TYPE AND SCREEN    EKG EKG Interpretation  Date/Time:  Saturday August 30 2018 07:03:54 EST Ventricular Rate:  70 PR Interval:    QRS Duration: 147 QT Interval:  459 QTC Calculation: 496 R Axis:   54 Text Interpretation:  Sinus rhythm Right bundle branch block Inferior infarct, old No significant change since last tracing Confirmed by Duffy Bruce [phone]) on 08/30/2018 7:08:23 AM Also confirmed by Duffy Bruce [phone]), editor Hattie Perch ([id number])  on 08/30/2018 8:59:26 AM   Radiology Ct Angio Chest Pe W Or Wo Contrast  Result Date: 08/30/2018 CLINICAL DATA:  Central abdominal pain and pressure since last night. History of myocardial infarction and cirrhosis. EXAM: CT ANGIOGRAPHY CHEST CT ABDOMEN AND PELVIS WITH CONTRAST TECHNIQUE: Multidetector CT imaging of the chest was performed using the standard protocol during bolus administration of intravenous contrast. Multiplanar CT image reconstructions and MIPs were obtained to evaluate the vascular anatomy. Multidetector CT imaging of the abdomen and pelvis was performed using the standard protocol during bolus administration of intravenous contrast. CONTRAST:  139mL ISOVUE-370 IOPAMIDOL (ISOVUE-370) INJECTION 76% COMPARISON:  Abdominopelvic CT 07/20/2018. FINDINGS: CTA CHEST FINDINGS Cardiovascular: The pulmonary arteries are well opacified with contrast to the level of the subsegmental branches. There is no evidence of acute pulmonary embolism. There is diffuse atherosclerosis of the aorta, great vessels and coronary arteries status post median sternotomy and CABG. Left subclavian pacemaker leads  extend into the right atrium and right ventricle. The heart is mildly enlarged. There is no pericardial effusion. Mediastinum/Nodes: There are no enlarged mediastinal, hilar or axillary lymph nodes. There is a small hiatal hernia. Distal esophageal varices are again noted. The trachea and thyroid gland appear unremarkable. Lungs/Pleura: Trace bilateral pleural effusions. There is no pneumothorax. Mild dependent atelectasis at both lung bases. No confluent airspace opacity or suspicious pulmonary nodule. Musculoskeletal/Chest wall: There is no chest wall mass or suspicious osseous finding. Review of the MIP images confirms the above findings. CT ABDOMEN and PELVIS FINDINGS Hepatobiliary: There are diffuse morphologic changes of cirrhosis. No focal hepatic lesion or abnormal enhancement identified. There is a large recanalized left paraumbilical vein. The portal vein is patent. There are multiple calcified gallstones. There is new diffuse gallbladder wall thickening with pericholecystic fluid inferiorly, highly suspicious for acute cholecystitis. No significant biliary dilatation demonstrated. Pancreas: Unremarkable. No pancreatic ductal dilatation or surrounding inflammatory changes. Spleen: Normal in size without focal abnormality. Adrenals/Urinary Tract: Both adrenal glands appear normal. There are tiny low-density renal lesions which are likely cysts. No evidence of urinary tract calculus or hydronephrosis. The bladder appears normal. Stomach/Bowel: The stomach and small bowel demonstrate no significant findings. There is mild wall thickening throughout the proximal colon, likely related to the patient's underlying liver disease. No focal bowel wall thickening, significant distention or surrounding inflammation identified. Mild sigmoid diverticulosis. Vascular/Lymphatic: Mildly prominent lymph nodes in the porta hepatis are stable. There is moderate diffuse aortic and branch vessel atherosclerosis. As above, there  is a recanalized paraumbilical vein with multiple large venous collaterals in the anterior abdominal wall and distal esophageal varices. No acute vascular findings are seen. Reproductive: There stable asymmetric calcifications within the left prostate lobe. The seminal vesicles appear normal. Other: There is a small amount of ascites around the liver and in the pelvis. There is mild diffuse mesenteric edema. No  focal extraluminal fluid collections are seen. Musculoskeletal: No acute or significant osseous findings. Lower lumbar spondylosis, similar to previous examination. IMPRESSION: 1. New extensive gallbladder wall thickening and pericholecystic fluid highly suspicious for acute cholecystitis. There are multiple underlying calcified gallstones. No significant biliary dilatation. 2. Cirrhosis and portal hypertension with multiple varices, similar to previous study. 3. Ascites and diffuse mesenteric edema. 4. No acute vascular findings are identified. There is no evidence of acute pulmonary embolism. Aortic Atherosclerosis (ICD10-I70.0). 5. Preliminary results have been discussed with Dr. Ellender Hose. Electronically Signed   By: Richardean Sale M.D.   On: 08/30/2018 10:53   Ct Abdomen Pelvis W Contrast  Result Date: 08/30/2018 CLINICAL DATA:  Central abdominal pain and pressure since last night. History of myocardial infarction and cirrhosis. EXAM: CT ANGIOGRAPHY CHEST CT ABDOMEN AND PELVIS WITH CONTRAST TECHNIQUE: Multidetector CT imaging of the chest was performed using the standard protocol during bolus administration of intravenous contrast. Multiplanar CT image reconstructions and MIPs were obtained to evaluate the vascular anatomy. Multidetector CT imaging of the abdomen and pelvis was performed using the standard protocol during bolus administration of intravenous contrast. CONTRAST:  143mL ISOVUE-370 IOPAMIDOL (ISOVUE-370) INJECTION 76% COMPARISON:  Abdominopelvic CT 07/20/2018. FINDINGS: CTA CHEST FINDINGS  Cardiovascular: The pulmonary arteries are well opacified with contrast to the level of the subsegmental branches. There is no evidence of acute pulmonary embolism. There is diffuse atherosclerosis of the aorta, great vessels and coronary arteries status post median sternotomy and CABG. Left subclavian pacemaker leads extend into the right atrium and right ventricle. The heart is mildly enlarged. There is no pericardial effusion. Mediastinum/Nodes: There are no enlarged mediastinal, hilar or axillary lymph nodes. There is a small hiatal hernia. Distal esophageal varices are again noted. The trachea and thyroid gland appear unremarkable. Lungs/Pleura: Trace bilateral pleural effusions. There is no pneumothorax. Mild dependent atelectasis at both lung bases. No confluent airspace opacity or suspicious pulmonary nodule. Musculoskeletal/Chest wall: There is no chest wall mass or suspicious osseous finding. Review of the MIP images confirms the above findings. CT ABDOMEN and PELVIS FINDINGS Hepatobiliary: There are diffuse morphologic changes of cirrhosis. No focal hepatic lesion or abnormal enhancement identified. There is a large recanalized left paraumbilical vein. The portal vein is patent. There are multiple calcified gallstones. There is new diffuse gallbladder wall thickening with pericholecystic fluid inferiorly, highly suspicious for acute cholecystitis. No significant biliary dilatation demonstrated. Pancreas: Unremarkable. No pancreatic ductal dilatation or surrounding inflammatory changes. Spleen: Normal in size without focal abnormality. Adrenals/Urinary Tract: Both adrenal glands appear normal. There are tiny low-density renal lesions which are likely cysts. No evidence of urinary tract calculus or hydronephrosis. The bladder appears normal. Stomach/Bowel: The stomach and small bowel demonstrate no significant findings. There is mild wall thickening throughout the proximal colon, likely related to the  patient's underlying liver disease. No focal bowel wall thickening, significant distention or surrounding inflammation identified. Mild sigmoid diverticulosis. Vascular/Lymphatic: Mildly prominent lymph nodes in the porta hepatis are stable. There is moderate diffuse aortic and branch vessel atherosclerosis. As above, there is a recanalized paraumbilical vein with multiple large venous collaterals in the anterior abdominal wall and distal esophageal varices. No acute vascular findings are seen. Reproductive: There stable asymmetric calcifications within the left prostate lobe. The seminal vesicles appear normal. Other: There is a small amount of ascites around the liver and in the pelvis. There is mild diffuse mesenteric edema. No focal extraluminal fluid collections are seen. Musculoskeletal: No acute or significant osseous findings. Lower lumbar spondylosis,  similar to previous examination. IMPRESSION: 1. New extensive gallbladder wall thickening and pericholecystic fluid highly suspicious for acute cholecystitis. There are multiple underlying calcified gallstones. No significant biliary dilatation. 2. Cirrhosis and portal hypertension with multiple varices, similar to previous study. 3. Ascites and diffuse mesenteric edema. 4. No acute vascular findings are identified. There is no evidence of acute pulmonary embolism. Aortic Atherosclerosis (ICD10-I70.0). 5. Preliminary results have been discussed with Dr. Ellender Hose. Electronically Signed   By: Richardean Sale M.D.   On: 08/30/2018 10:53   Dg Chest Portable 1 View  Result Date: 08/30/2018 CLINICAL DATA:  Abdominal pain with nausea, vomiting and cough for 3 weeks. History congestive heart failure and pacemaker. EXAM: PORTABLE CHEST 1 VIEW COMPARISON:  03/20/2018 and 09/07/2017 radiographs. FINDINGS: 0732 hour. The left subclavian pacemaker leads appear unchanged within the right atrium and right ventricle. The heart size is stable status post median sternotomy  and CABG. The lungs remain clear. There is no pleural effusion or pneumothorax. The bones appear unchanged. Telemetry leads overlie the chest. IMPRESSION: Stable chest post CABG.  No active cardiopulmonary process. Electronically Signed   By: Richardean Sale M.D.   On: 08/30/2018 08:07    Procedures .Critical Care Performed by: Duffy Bruce, MD Authorized by: Duffy Bruce, MD   Critical care provider statement:    Critical care time (minutes):  45   Critical care time was exclusive of:  Separately billable procedures and treating other patients and teaching time   Critical care was necessary to treat or prevent imminent or life-threatening deterioration of the following conditions:  Circulatory failure, cardiac failure, respiratory failure and sepsis   Critical care was time spent personally by me on the following activities:  Development of treatment plan with patient or surrogate, discussions with consultants, evaluation of patient's response to treatment, examination of patient, obtaining history from patient or surrogate, ordering and performing treatments and interventions, ordering and review of laboratory studies, ordering and review of radiographic studies, pulse oximetry, re-evaluation of patient's condition and review of old charts   I assumed direction of critical care for this patient from another provider in my specialty: no     (including critical care time)  Medications Ordered in ED Medications  metroNIDAZOLE (FLAGYL) IVPB 500 mg (0 mg Intravenous Stopped 08/30/18 0909)  0.9 %  sodium chloride infusion (250 mLs Intravenous New Bag/Given 08/30/18 1239)  norepinephrine (LEVOPHED) 4mg  in D5W 272mL premix infusion (0 mcg/min Intravenous Hold 08/30/18 1242)  piperacillin-tazobactam (ZOSYN) IVPB 3.375 g (has no administration in time range)  vancomycin (VANCOCIN) IVPB 750 mg/150 ml premix (0 mg Intravenous Stopped 08/30/18 1329)  0.9 %  sodium chloride infusion (Manually  program via Guardrails IV Fluids) (has no administration in time range)  ondansetron (ZOFRAN) injection 4 mg (4 mg Intravenous Given 08/30/18 0759)  ceFEPIme (MAXIPIME) 2 g in sodium chloride 0.9 % 100 mL IVPB (0 g Intravenous Stopped 08/30/18 0829)  sodium chloride 0.9 % bolus 1,000 mL (0 mLs Intravenous Stopped 08/30/18 0806)  sodium chloride 0.9 % bolus 1,000 mL (0 mLs Intravenous Stopped 08/30/18 0907)  fentaNYL (SUBLIMAZE) injection 50 mcg (50 mcg Intravenous Given 08/30/18 0920)  iohexol (OMNIPAQUE) 300 MG/ML solution 100 mL (100 mLs Intravenous Contrast Given 08/30/18 0957)  iopamidol (ISOVUE-370) 76 % injection (100 mLs  Contrast Given 08/30/18 1015)  acetaminophen (TYLENOL) suppository 650 mg (650 mg Rectal Given 08/30/18 1123)     Initial Impression / Assessment and Plan / ED Course  I have reviewed the triage vital signs  and the nursing notes.  Pertinent labs & imaging results that were available during my care of the patient were reviewed by me and considered in my medical decision making (see chart for details).  Clinical Course as of Aug 30 1433  Sat Aug 30, 2018  0716 67 yo m with extensive history including CHF, cirrhosis, h/o cholecystitis tx medically in October here with n/v/d and hypotension. CODE SEPSIS initiated 2/2 hypotension, concern for intra-abd pathology. CT scan ordered. Will start broad ABX, fluids though will be cautious in setting of CHF, cirrhosis.   [CI]  [id number] LA 4.3 - likely multifactorial with component of dehydration, possible sepsis, and liver disease - IVF running, BP improving   [CI]  0822 @ baseline  Ammonia(!) [CI]    Clinical Course User Index [CI] Duffy Bruce, MD    Imaging c/f recurrent cholecystitis. Will admit to ICU given persistent hypotension. Do not see obvious perforation at this point, and pt is poor surgical candidate. CT Angio was alos obtained 2/2 pleurisy, low-grade temperature, tachypnea w/ increased WOB and immobility- no  large PEs on my prelim read. He's stable on BIPAP at this time, and is full code but if CT shows large perforaiton, will plan to discuss end-of-life care with pt. Attempted to d/w Radiology Dr. Romelle Starcher, who will read when is able. Given pt's persistent hypotension, no apparent perf will c/s medical ICU for admission.  ICU has seen, does not feel ICU needed. Will admit to [facility] Step down. Dr. Molli Posey of [facility] to see. Pt remains stable clinically. Airway intact.  Final Clinical Impressions(s) / ED Diagnoses   Final diagnoses:  Severe sepsis ([place])  Cholecystitis, acute    ED Discharge Orders    None       Duffy Bruce, MD 08/30/18 1434    Duffy Bruce, MD 09/15/18 1944

## 2018-08-30 NOTE — Progress Notes (Signed)
Chief Complaint: Patient was seen in consultation today for perc chole drain at the request of Dr. Verita Lamb  Referring Physician(s):  Dr. Verita Lamb  Supervising Physician: Markus Daft  Patient Status: [facility] - In-pt  History of Present Illness: Hunter Carter is a 67 y.o. 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  He was hospitalized in October with acute cholecystitis and was treated with antibiotics because of his prohibitively high operative risk.  Over the last two weeks, he has had worsening pain, nausea, vomiting, and diarrhea. He presented to the ED and his CT scan shows interval worsening of acute cholecystitis. He is to be admitted for sepsis and placed on IV abx.  He is not a good surgical candidate due to his underlying comorbidities and therefore IR is asked to place perc chole drain Chart, imaging, meds, labs reviewed. Chronic coagulopathy due to his liver disease, FFP has been ordered. Has been NPO all day today. No family present at this time.   Past Medical History:  Diagnosis Date  . Acute hepatic encephalopathy 10/10/2015   [name] 10/10/2015  . Arthritis    "joints ache" (10/10/2015)  . Bleeding stomach ulcer    "recently" (10/10/2015)  . CAD (coronary artery disease)   . CHF (congestive heart failure) ([place])   . Cirrhosis ([place])    due to HCV/notes 10/10/2015  . ETOH abuse   . GERD (gastroesophageal reflux disease)   . Heart block AV second degree    permanent pacemaker 02/2004  . Hepatitis C   . Hyperlipidemia   . Inferior MI ([place]) 2005  . Presence of permanent cardiac pacemaker   . RBBB   . Systemic hypertension   . Tubulovillous adenoma of colon   . Ventricular tachycardia (HCC)    nonsustained    Past Surgical History:  Procedure Laterality Date  . CARDIAC CATHETERIZATION  03/06/04   significant 3 vessel disease, totalled graft obtuse marginal  .  CORONARY ARTERY BYPASS GRAFT  02/10/03   LIMA to LAD,SVG to first diagonal, SVT to OM1  . ESOPHAGOGASTRODUODENOSCOPY N/A 07/29/2015   Procedure: ESOPHAGOGASTRODUODENOSCOPY (EGD);  Surgeon: Clarene Essex, MD;  Location: [facility] ENDOSCOPY;  Service: Endoscopy;  Laterality: N/A;  . INSERT / REPLACE / REMOVE PACEMAKER  03/2010   [employer]  . KNEE ARTHROSCOPY Left [phone]  . MEDIASTINAL EXPLORATION  02/10/2003  . NM MYOCAR PERF WALL MOTION  04/05/09   low risk scan-abnormal-mod. perfusion defect basal inferoseptal,basal inferior,mid inferoseptal,mid inferior and apical inferior regions  . TOTAL KNEE ARTHROPLASTY Left   . US ECHOCARDIOGRAPHY  01/20/07   mild MR,mild to mod. TR,trace AI,mild PI    Allergies: Patient has no known allergies.  Medications: Prior to Admission medications   Medication Sig Start Date End Date Taking? Authorizing Provider  furosemide (LASIX) 40 MG tablet TAKE 1 TABLET BY MOUTH  DAILY AS NEEDED Patient taking differently: Take 40 mg by mouth daily. Make take an additional 40 mg if needed 07/15/18  Yes Lockamy, Timothy, DO  lactulose (CHRONULAC) 10 GM/15ML solution Take 45 mLs (30 g total) by mouth 2 (two) times daily. Titrate for 3-4 BM's/ day Patient taking differently: Take 30 g by mouth 3 (three) times daily. Titrate for 3-4 BM's/ day 03/22/18  Yes Sheikh, Omair Latif, DO  losartan (COZAAR) 25 MG tablet TAKE 1 TABLET BY MOUTH  DAILY Patient taking differently: Take 25 mg by mouth daily.  04/30/18  Yes Nuala Alpha,  DO  metoprolol tartrate (LOPRESSOR) 50 MG tablet TAKE 1 TABLET BY MOUTH TWO  TIMES DAILY Patient taking differently: Take 50 mg by mouth daily.  07/15/18  Yes Lockamy, Timothy, DO  omeprazole (PRILOSEC) 20 MG capsule TAKE 1 CAPSULE BY MOUTH  DAILY Patient taking differently: Take 20 mg by mouth daily.  07/15/18  Yes Lockamy, Timothy, DO  PROAIR HFA 108 (90 Base) MCG/ACT inhaler USE 2 PUFFS BY MOUTH EVERY  4 HOURS AS NEEDED FOR  WHEEZING OR SHORTNESS OF  BREATH  (OR COUGHING) Patient taking differently: Inhale 1-2 puffs into the lungs every 4 (four) hours as needed for wheezing.  06/20/18  Yes Lockamy, Timothy, DO  simvastatin (ZOCOR) 20 MG tablet TAKE 1 TABLET BY MOUTH  DAILY Patient taking differently: Take 20 mg by mouth daily at 6 PM.  07/15/18  Yes Nuala Alpha, DO     Family History  Problem Relation Age of Onset  . Heart failure Mother   . Liver disease Father   . Heart failure Sister   . Hypertension Sister   . Hypertension Sister   . Colon cancer Neg Hx     Social History   Socioeconomic History  . Marital status: Widowed    Spouse name: Not on file  . Number of children: Not on file  . Years of education: Not on file  . Highest education level: Not on file  Occupational History  . Not on file  Social Needs  . Financial resource strain: Not on file  . Food insecurity:    Worry: Not on file    Inability: Not on file  . Transportation needs:    Medical: Not on file    Non-medical: Not on file  Tobacco Use  . Smoking status: Former Smoker    Packs/day: 0.25    Years: 41.00    Pack years: 10.25    Types: Cigarettes    Last attempt to quit: 11/15/2017    Years since quitting: 0.7  . Smokeless tobacco: Never Used  Substance and Sexual Activity  . Alcohol use: Yes    Comment: 10/10/2015 "last drink was 2-3 months ago; h/o abuse"  . Drug use: Yes    Types: Marijuana    Comment: last use 2015  . Sexual activity: Not on file  Lifestyle  . Physical activity:    Days per week: Not on file    Minutes per session: Not on file  . Stress: Not on file  Relationships  . Social connections:    Talks on phone: Not on file    Gets together: Not on file    Attends religious service: Not on file    Active member of club or organization: Not on file    Attends meetings of clubs or organizations: Not on file    Relationship status: Not on file  Other Topics Concern  . Not on file  Social History Narrative  . Not on file      Review of Systems: A 12 point ROS discussed and pertinent positives are indicated in the HPI above.  All other systems are negative.  Review of Systems  Vital Signs: BP (!) 84/51   Pulse 66   Temp 97.9 F (36.6 C) (Oral)   Resp (!) 28   Ht 5\' 6"  (1.676 m)   Wt 96.6 kg   SpO2 92%   BMI 34.37 kg/m   Physical Exam  Constitutional: He is oriented to person, place, and time. He appears well-developed. No  distress.  HENT:  Head: Normocephalic.  Mouth/Throat: Oropharynx is clear and moist.  Neck: Normal range of motion. No JVD present.  Cardiovascular: Normal rate, regular rhythm and normal heart sounds.  Pulmonary/Chest: Effort normal and breath sounds normal. No respiratory distress.  Abdominal: Soft. There is tenderness. There is guarding.  RUQ tenderness with mild guarding  Neurological: He is alert and oriented to person, place, and time.  Skin: Skin is warm and dry. He is not diaphoretic.    Imaging: Ct Angio Chest Pe W Or Wo Contrast  Result Date: 08/30/2018 CLINICAL DATA:  Central abdominal pain and pressure since last night. History of myocardial infarction and cirrhosis. EXAM: CT ANGIOGRAPHY CHEST CT ABDOMEN AND PELVIS WITH CONTRAST TECHNIQUE: Multidetector CT imaging of the chest was performed using the standard protocol during bolus administration of intravenous contrast. Multiplanar CT image reconstructions and MIPs were obtained to evaluate the vascular anatomy. Multidetector CT imaging of the abdomen and pelvis was performed using the standard protocol during bolus administration of intravenous contrast. CONTRAST:  19mL ISOVUE-370 IOPAMIDOL (ISOVUE-370) INJECTION 76% COMPARISON:  Abdominopelvic CT 07/20/2018. FINDINGS: CTA CHEST FINDINGS Cardiovascular: The pulmonary arteries are well opacified with contrast to the level of the subsegmental branches. There is no evidence of acute pulmonary embolism. There is diffuse atherosclerosis of the aorta, great vessels and  coronary arteries status post median sternotomy and CABG. Left subclavian pacemaker leads extend into the right atrium and right ventricle. The heart is mildly enlarged. There is no pericardial effusion. Mediastinum/Nodes: There are no enlarged mediastinal, hilar or axillary lymph nodes. There is a small hiatal hernia. Distal esophageal varices are again noted. The trachea and thyroid gland appear unremarkable. Lungs/Pleura: Trace bilateral pleural effusions. There is no pneumothorax. Mild dependent atelectasis at both lung bases. No confluent airspace opacity or suspicious pulmonary nodule. Musculoskeletal/Chest wall: There is no chest wall mass or suspicious osseous finding. Review of the MIP images confirms the above findings. CT ABDOMEN and PELVIS FINDINGS Hepatobiliary: There are diffuse morphologic changes of cirrhosis. No focal hepatic lesion or abnormal enhancement identified. There is a large recanalized left paraumbilical vein. The portal vein is patent. There are multiple calcified gallstones. There is new diffuse gallbladder wall thickening with pericholecystic fluid inferiorly, highly suspicious for acute cholecystitis. No significant biliary dilatation demonstrated. Pancreas: Unremarkable. No pancreatic ductal dilatation or surrounding inflammatory changes. Spleen: Normal in size without focal abnormality. Adrenals/Urinary Tract: Both adrenal glands appear normal. There are tiny low-density renal lesions which are likely cysts. No evidence of urinary tract calculus or hydronephrosis. The bladder appears normal. Stomach/Bowel: The stomach and small bowel demonstrate no significant findings. There is mild wall thickening throughout the proximal colon, likely related to the patient's underlying liver disease. No focal bowel wall thickening, significant distention or surrounding inflammation identified. Mild sigmoid diverticulosis. Vascular/Lymphatic: Mildly prominent lymph nodes in the porta hepatis are  stable. There is moderate diffuse aortic and branch vessel atherosclerosis. As above, there is a recanalized paraumbilical vein with multiple large venous collaterals in the anterior abdominal wall and distal esophageal varices. No acute vascular findings are seen. Reproductive: There stable asymmetric calcifications within the left prostate lobe. The seminal vesicles appear normal. Other: There is a small amount of ascites around the liver and in the pelvis. There is mild diffuse mesenteric edema. No focal extraluminal fluid collections are seen. Musculoskeletal: No acute or significant osseous findings. Lower lumbar spondylosis, similar to previous examination. IMPRESSION: 1. New extensive gallbladder wall thickening and pericholecystic fluid highly suspicious for acute  cholecystitis. There are multiple underlying calcified gallstones. No significant biliary dilatation. 2. Cirrhosis and portal hypertension with multiple varices, similar to previous study. 3. Ascites and diffuse mesenteric edema. 4. No acute vascular findings are identified. There is no evidence of acute pulmonary embolism. Aortic Atherosclerosis (ICD10-I70.0). 5. Preliminary results have been discussed with Dr. Ellender Hose. Electronically Signed   By: Richardean Sale M.D.   On: 08/30/2018 10:53   Ct Abdomen Pelvis W Contrast  Result Date: 08/30/2018 CLINICAL DATA:  Central abdominal pain and pressure since last night. History of myocardial infarction and cirrhosis. EXAM: CT ANGIOGRAPHY CHEST CT ABDOMEN AND PELVIS WITH CONTRAST TECHNIQUE: Multidetector CT imaging of the chest was performed using the standard protocol during bolus administration of intravenous contrast. Multiplanar CT image reconstructions and MIPs were obtained to evaluate the vascular anatomy. Multidetector CT imaging of the abdomen and pelvis was performed using the standard protocol during bolus administration of intravenous contrast. CONTRAST:  12mL ISOVUE-370 IOPAMIDOL  (ISOVUE-370) INJECTION 76% COMPARISON:  Abdominopelvic CT 07/20/2018. FINDINGS: CTA CHEST FINDINGS Cardiovascular: The pulmonary arteries are well opacified with contrast to the level of the subsegmental branches. There is no evidence of acute pulmonary embolism. There is diffuse atherosclerosis of the aorta, great vessels and coronary arteries status post median sternotomy and CABG. Left subclavian pacemaker leads extend into the right atrium and right ventricle. The heart is mildly enlarged. There is no pericardial effusion. Mediastinum/Nodes: There are no enlarged mediastinal, hilar or axillary lymph nodes. There is a small hiatal hernia. Distal esophageal varices are again noted. The trachea and thyroid gland appear unremarkable. Lungs/Pleura: Trace bilateral pleural effusions. There is no pneumothorax. Mild dependent atelectasis at both lung bases. No confluent airspace opacity or suspicious pulmonary nodule. Musculoskeletal/Chest wall: There is no chest wall mass or suspicious osseous finding. Review of the MIP images confirms the above findings. CT ABDOMEN and PELVIS FINDINGS Hepatobiliary: There are diffuse morphologic changes of cirrhosis. No focal hepatic lesion or abnormal enhancement identified. There is a large recanalized left paraumbilical vein. The portal vein is patent. There are multiple calcified gallstones. There is new diffuse gallbladder wall thickening with pericholecystic fluid inferiorly, highly suspicious for acute cholecystitis. No significant biliary dilatation demonstrated. Pancreas: Unremarkable. No pancreatic ductal dilatation or surrounding inflammatory changes. Spleen: Normal in size without focal abnormality. Adrenals/Urinary Tract: Both adrenal glands appear normal. There are tiny low-density renal lesions which are likely cysts. No evidence of urinary tract calculus or hydronephrosis. The bladder appears normal. Stomach/Bowel: The stomach and small bowel demonstrate no significant  findings. There is mild wall thickening throughout the proximal colon, likely related to the patient's underlying liver disease. No focal bowel wall thickening, significant distention or surrounding inflammation identified. Mild sigmoid diverticulosis. Vascular/Lymphatic: Mildly prominent lymph nodes in the porta hepatis are stable. There is moderate diffuse aortic and branch vessel atherosclerosis. As above, there is a recanalized paraumbilical vein with multiple large venous collaterals in the anterior abdominal wall and distal esophageal varices. No acute vascular findings are seen. Reproductive: There stable asymmetric calcifications within the left prostate lobe. The seminal vesicles appear normal. Other: There is a small amount of ascites around the liver and in the pelvis. There is mild diffuse mesenteric edema. No focal extraluminal fluid collections are seen. Musculoskeletal: No acute or significant osseous findings. Lower lumbar spondylosis, similar to previous examination. IMPRESSION: 1. New extensive gallbladder wall thickening and pericholecystic fluid highly suspicious for acute cholecystitis. There are multiple underlying calcified gallstones. No significant biliary dilatation. 2. Cirrhosis and portal hypertension  with multiple varices, similar to previous study. 3. Ascites and diffuse mesenteric edema. 4. No acute vascular findings are identified. There is no evidence of acute pulmonary embolism. Aortic Atherosclerosis (ICD10-I70.0). 5. Preliminary results have been discussed with Dr. Ellender Hose. Electronically Signed   By: Richardean Sale M.D.   On: 08/30/2018 10:53   Dg Chest Portable 1 View  Result Date: 08/30/2018 CLINICAL DATA:  Abdominal pain with nausea, vomiting and cough for 3 weeks. History congestive heart failure and pacemaker. EXAM: PORTABLE CHEST 1 VIEW COMPARISON:  03/20/2018 and 09/07/2017 radiographs. FINDINGS: 0732 hour. The left subclavian pacemaker leads appear unchanged within  the right atrium and right ventricle. The heart size is stable status post median sternotomy and CABG. The lungs remain clear. There is no pleural effusion or pneumothorax. The bones appear unchanged. Telemetry leads overlie the chest. IMPRESSION: Stable chest post CABG.  No active cardiopulmonary process. Electronically Signed   By: Richardean Sale M.D.   On: 08/30/2018 08:07    Labs:  CBC: Recent Labs    07/22/18 0447 07/24/18 0458 08/15/18 1112 08/30/18 0705  WBC 11.6* 8.9 4.7 12.5*  HGB 14.2 13.9 13.0 14.4  HCT 42.0 42.0 35.2* 44.6  PLT 47* 53* 57* 61*    COAGS: Recent Labs    03/20/18 1918 03/22/18 0356 07/22/18 0447 08/30/18 0705  INR 1.63 1.69 2.17 2.08    BMP: Recent Labs    07/22/18 0447 07/24/18 0458 08/15/18 1112 08/30/18 0705  NA 137 137 137 131*  K 3.6 3.3* 3.8 4.3  CL 104 104 104 105  CO2 27 26 24  18*  GLUCOSE 88 105* 85 112*  BUN 19 13 8 8   CALCIUM 8.0* 7.8* 7.8* 7.7*  CREATININE 0.96 0.80 0.85 1.47*  GFRNONAA >60 >60 91 48*  GFRAA >60 >60 106 56*    LIVER FUNCTION TESTS: Recent Labs    07/22/18 0447 07/24/18 0458 08/15/18 1112 08/30/18 0705  BILITOT 2.9* 2.5* 2.1* 3.9*  AST 34 41 36 41  ALT 21 21 15 17   ALKPHOS 109 112 123* 92  PROT 6.2* 6.2* 7.1 8.5*  ALBUMIN 2.1* 1.9* 2.3* 1.7*    TUMOR MARKERS: No results for input(s): AFPTM, CEA, CA199, CHROMGRNA in the last 8760 hours.  Assessment and Plan: Progressive acute cholecystitis with possible early rupture based on imaging. Underlying end stage liver disease with associated coagulopathy. Plan for perc chole drain. Pt to get FFP prior/during procedure this afternoon. Already a bit hypotensive, will have to be minimal sedation. Risks and benefits discussed with the patient including, but not limited to bleeding, infection, gallbladder perforation, bile leak, sepsis or even death. Also discussed that tube would be in place a minimum of 2 months, if not for the rest of his life.  All  of the patient's questions were answered, patient is agreeable to proceed. Consent signed and in chart.    Thank you for this interesting consult.  I greatly enjoyed meeting Hunter Carter and look forward to participating in their care.  A copy of this report was sent to the requesting provider on this date.  Electronically Signed: Ascencion Dike, PA-C 08/30/2018, 2:48 PM   I spent a total of 30 minutes in face to face in clinical consultation, greater than 50% of which was counseling/coordinating care for perc chole drain

## 2018-08-30 NOTE — Consult Note (Signed)
Reason for Consult:Cholecystitis Referring Physician: Lundon Verdejo is an 67 y.o. male.  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  He was hospitalized in October with acute cholecystitis and was treated with antibiotics because of his prohibitively high operative risk.  Over the last two weeks, he has had worsening pain, nausea, vomiting, and diarrhea.     Past Medical History:  Diagnosis Date  . Acute hepatic encephalopathy 10/10/2015   [name] 10/10/2015  . Arthritis    "joints ache" (10/10/2015)  . Bleeding stomach ulcer    "recently" (10/10/2015)  . CAD (coronary artery disease)   . CHF (congestive heart failure) ([place])   . Cirrhosis ([place])    due to HCV/notes 10/10/2015  . ETOH abuse   . GERD (gastroesophageal reflux disease)   . Heart block AV second degree    permanent pacemaker 02/2004  . Hepatitis C   . Hyperlipidemia   . Inferior MI ([place]) 2005  . Presence of permanent cardiac pacemaker   . RBBB   . Systemic hypertension   . Tubulovillous adenoma of colon   . Ventricular tachycardia (HCC)    nonsustained    Past Surgical History:  Procedure Laterality Date  . CARDIAC CATHETERIZATION  03/06/04   significant 3 vessel disease, totalled graft obtuse marginal  . CORONARY ARTERY BYPASS GRAFT  02/10/03   LIMA to LAD,SVG to first diagonal, SVT to OM1  . ESOPHAGOGASTRODUODENOSCOPY N/A 07/29/2015   Procedure: ESOPHAGOGASTRODUODENOSCOPY (EGD);  Surgeon: Clarene Essex, MD;  Location: [facility] ENDOSCOPY;  Service: Endoscopy;  Laterality: N/A;  . INSERT / REPLACE / REMOVE PACEMAKER  03/2010   [employer]  . KNEE ARTHROSCOPY Left [phone]  . MEDIASTINAL EXPLORATION  02/10/2003  . NM MYOCAR PERF WALL MOTION  04/05/09   low risk scan-abnormal-mod. perfusion defect basal inferoseptal,basal inferior,mid inferoseptal,mid inferior and apical inferior regions  .  TOTAL KNEE ARTHROPLASTY Left   . US ECHOCARDIOGRAPHY  01/20/07   mild MR,mild to mod. TR,trace AI,mild PI    Family History  Problem Relation Age of Onset  . Heart failure Mother   . Liver disease Father   . Heart failure Sister   . Hypertension Sister   . Hypertension Sister   . Colon cancer Neg Hx     Social History:  reports that he quit smoking about 9 months ago. His smoking use included cigarettes. He has a 10.25 pack-year smoking history. He has never used smokeless tobacco. He reports that he drinks alcohol. He reports that he has current or past drug history. Drug: Marijuana.  Allergies: No Known Allergies  Medications  Prior to Admission medications   Medication Sig Start Date End Date Taking? Authorizing Provider  furosemide (LASIX) 40 MG tablet TAKE 1 TABLET BY MOUTH  DAILY AS NEEDED Patient taking differently: Take 40 mg by mouth daily. Make take an additional 40 mg if needed 07/15/18  Yes Lockamy, Timothy, DO  lactulose (CHRONULAC) 10 GM/15ML solution Take 45 mLs (30 g total) by mouth 2 (two) times daily. Titrate for 3-4 BM's/ day Patient taking differently: Take 30 g by mouth 3 (three) times daily. Titrate for 3-4 BM's/ day 03/22/18  Yes Sheikh, Omair Latif, DO  losartan (COZAAR) 25 MG tablet TAKE 1 TABLET BY MOUTH  DAILY Patient taking differently: Take 25 mg by mouth daily.  04/30/18  Yes Lockamy, Timothy, DO  metoprolol tartrate (LOPRESSOR) 50 MG tablet TAKE  1 TABLET BY MOUTH TWO  TIMES DAILY Patient taking differently: Take 50 mg by mouth daily.  07/15/18  Yes Lockamy, Timothy, DO  omeprazole (PRILOSEC) 20 MG capsule TAKE 1 CAPSULE BY MOUTH  DAILY Patient taking differently: Take 20 mg by mouth daily.  07/15/18  Yes Lockamy, Timothy, DO  PROAIR HFA 108 (90 Base) MCG/ACT inhaler USE 2 PUFFS BY MOUTH EVERY  4 HOURS AS NEEDED FOR  WHEEZING OR SHORTNESS OF  BREATH (OR COUGHING) Patient taking differently: Inhale 1-2 puffs into the lungs every 4 (four) hours as needed for  wheezing.  06/20/18  Yes Lockamy, Timothy, DO  simvastatin (ZOCOR) 20 MG tablet TAKE 1 TABLET BY MOUTH  DAILY Patient taking differently: Take 20 mg by mouth daily at 6 PM.  07/15/18  Yes Nuala Alpha, DO     Results for orders placed or performed during the hospital encounter of 08/30/18 (from the past 48 hour(s))  CBC with Differential     Status: Abnormal   Collection Time: 08/30/18  7:05 AM  Result Value Ref Range   WBC 12.5 (H) 4.0 - 10.5 K/uL   RBC 4.47 4.22 - 5.81 MIL/uL   Hemoglobin 14.4 13.0 - 17.0 g/dL   HCT 44.6 39.0 - 52.0 %   MCV 99.8 80.0 - 100.0 fL   MCH 32.2 26.0 - 34.0 pg   MCHC 32.3 30.0 - 36.0 g/dL   RDW 14.0 11.5 - 15.5 %   Platelets 61 (L) 150 - 400 K/uL    Comment: Immature Platelet Fraction may be clinically indicated, consider ordering this additional test [id number]    nRBC 0.0 0.0 - 0.2 %   Neutrophils Relative % 78 %   Neutro Abs 9.7 (H) 1.7 - 7.7 K/uL   Lymphocytes Relative 13 %   Lymphs Abs 1.6 0.7 - 4.0 K/uL   Monocytes Relative 8 %   Monocytes Absolute 1.0 0.1 - 1.0 K/uL   Eosinophils Relative 0 %   Eosinophils Absolute 0.0 0.0 - 0.5 K/uL   Basophils Relative 0 %   Basophils Absolute 0.0 0.0 - 0.1 K/uL   Immature Granulocytes 1 %   Abs Immature Granulocytes 0.08 (H) 0.00 - 0.07 K/uL    Comment: Performed at [facility], [address] [place], [place] [address]  Comprehensive metabolic panel     Status: Abnormal   Collection Time: 08/30/18  7:05 AM  Result Value Ref Range   Sodium 131 (L) 135 - 145 mmol/L   Potassium 4.3 3.5 - 5.1 mmol/L   Chloride 105 98 - 111 mmol/L   CO2 18 (L) 22 - 32 mmol/L   Glucose, Bld 112 (H) 70 - 99 mg/dL   BUN 8 8 - 23 mg/dL   Creatinine, Ser 1.47 (H) 0.61 - 1.24 mg/dL   Calcium 7.7 (L) 8.9 - 10.3 mg/dL   Total Protein 8.5 (H) 6.5 - 8.1 g/dL   Albumin 1.7 (L) 3.5 - 5.0 g/dL   AST 41 15 - 41 U/L   ALT 17 0 - 44 U/L   Alkaline Phosphatase 92 38 - 126 U/L   Total Bilirubin 3.9 (H) 0.3 - 1.2 mg/dL    GFR calc non Af Amer 48 (L) >60 mL/min   GFR calc Af Amer 56 (L) >60 mL/min    Comment: (NOTE) The eGFR has been calculated using the CKD EPI equation. This calculation has not been validated in all clinical situations. eGFR's persistently <60 mL/min signify possible Chronic Kidney Disease.  Anion gap 8 5 - 15    Comment: Performed at [facility] [address] [place], [place] [address]  Lipase, blood     Status: None   Collection Time: 08/30/18  7:05 AM  Result Value Ref Range   Lipase 28 11 - 51 U/L    Comment: Performed at [facility] [address], [place], [place] [address]  Protime-INR     Status: Abnormal   Collection Time: 08/30/18  7:05 AM  Result Value Ref Range   Prothrombin Time 23.1 (H) 11.4 - 15.2 seconds   INR 2.08     Comment: Performed at [facility] [address] [place], [place] [address]  Troponin I - ONCE - STAT     Status: None   Collection Time: 08/30/18  7:05 AM  Result Value Ref Range   Troponin I <0.03 <0.03 ng/mL    Comment: Performed at [facility] [address], [place], [place] [address]  Ammonia     Status: Abnormal   Collection Time: 08/30/18  7:05 AM  Result Value Ref Range   Ammonia 59 (H) 9 - 35 umol/L    Comment: Performed at [facility], [place] [address], [place], [place] [address]  Brain natriuretic peptide     Status: Abnormal   Collection Time: 08/30/18  7:05 AM  Result Value Ref Range   B Natriuretic Peptide 133.0 (H) 0.0 - 100.0 pg/mL    Comment: Performed at [facility] [address] [place], [place] [address]  I-Stat CG4 Lactic Acid, ED     Status: Abnormal   Collection Time: 08/30/18  7:19 AM  Result Value Ref Range   Lactic Acid, Venous 4.30 (HH) 0.5 - 1.9 mmol/L  I-Stat CG4 Lactic Acid, ED     Status: Abnormal   Collection Time: 08/30/18  9:38 AM  Result Value Ref Range   Lactic Acid, Venous 3.64 (HH) 0.5 - 1.9 mmol/L   Comment NOTIFIED PHYSICIAN     Ct Angio Chest Pe W  Or Wo Contrast  Result Date: 08/30/2018 CLINICAL DATA:  Central abdominal pain and pressure since last night. History of myocardial infarction and cirrhosis. EXAM: CT ANGIOGRAPHY CHEST CT ABDOMEN AND PELVIS WITH CONTRAST TECHNIQUE: Multidetector CT imaging of the chest was performed using the standard protocol during bolus administration of intravenous contrast. Multiplanar CT image reconstructions and MIPs were obtained to evaluate the vascular anatomy. Multidetector CT imaging of the abdomen and pelvis was performed using the standard protocol during bolus administration of intravenous contrast. CONTRAST:  147m ISOVUE-370 IOPAMIDOL (ISOVUE-370) INJECTION 76% COMPARISON:  Abdominopelvic CT 07/20/2018. FINDINGS: CTA CHEST FINDINGS Cardiovascular: The pulmonary arteries are well opacified with contrast to the level of the subsegmental branches. There is no evidence of acute pulmonary embolism. There is diffuse atherosclerosis of the aorta, great vessels and coronary arteries status post median sternotomy and CABG. Left subclavian pacemaker leads extend into the right atrium and right ventricle. The heart is mildly enlarged. There is no pericardial effusion. Mediastinum/Nodes: There are no enlarged mediastinal, hilar or axillary lymph nodes. There is a small hiatal hernia. Distal esophageal varices are again noted. The trachea and thyroid gland appear unremarkable. Lungs/Pleura: Trace bilateral pleural effusions. There is no pneumothorax. Mild dependent atelectasis at both lung bases. No confluent airspace opacity or suspicious pulmonary nodule. Musculoskeletal/Chest wall: There is no chest wall mass or suspicious osseous finding. Review of the MIP images confirms the above findings.  CT ABDOMEN and PELVIS FINDINGS Hepatobiliary: There are diffuse morphologic changes of cirrhosis. No focal hepatic lesion or abnormal enhancement identified. There is a large recanalized left paraumbilical vein. The portal vein is  patent. There are multiple calcified gallstones. There is new diffuse gallbladder wall thickening with pericholecystic fluid inferiorly, highly suspicious for acute cholecystitis. No significant biliary dilatation demonstrated. Pancreas: Unremarkable. No pancreatic ductal dilatation or surrounding inflammatory changes. Spleen: Normal in size without focal abnormality. Adrenals/Urinary Tract: Both adrenal glands appear normal. There are tiny low-density renal lesions which are likely cysts. No evidence of urinary tract calculus or hydronephrosis. The bladder appears normal. Stomach/Bowel: The stomach and small bowel demonstrate no significant findings. There is mild wall thickening throughout the proximal colon, likely related to the patient's underlying liver disease. No focal bowel wall thickening, significant distention or surrounding inflammation identified. Mild sigmoid diverticulosis. Vascular/Lymphatic: Mildly prominent lymph nodes in the porta hepatis are stable. There is moderate diffuse aortic and branch vessel atherosclerosis. As above, there is a recanalized paraumbilical vein with multiple large venous collaterals in the anterior abdominal wall and distal esophageal varices. No acute vascular findings are seen. Reproductive: There stable asymmetric calcifications within the left prostate lobe. The seminal vesicles appear normal. Other: There is a small amount of ascites around the liver and in the pelvis. There is mild diffuse mesenteric edema. No focal extraluminal fluid collections are seen. Musculoskeletal: No acute or significant osseous findings. Lower lumbar spondylosis, similar to previous examination. IMPRESSION: 1. New extensive gallbladder wall thickening and pericholecystic fluid highly suspicious for acute cholecystitis. There are multiple underlying calcified gallstones. No significant biliary dilatation. 2. Cirrhosis and portal hypertension with multiple varices, similar to previous study.  3. Ascites and diffuse mesenteric edema. 4. No acute vascular findings are identified. There is no evidence of acute pulmonary embolism. Aortic Atherosclerosis (ICD10-I70.0). 5. Preliminary results have been discussed with Dr. Ellender Hose. Electronically Signed   By: Richardean Sale M.D.   On: 08/30/2018 10:53   Ct Abdomen Pelvis W Contrast  Result Date: 08/30/2018 CLINICAL DATA:  Central abdominal pain and pressure since last night. History of myocardial infarction and cirrhosis. EXAM: CT ANGIOGRAPHY CHEST CT ABDOMEN AND PELVIS WITH CONTRAST TECHNIQUE: Multidetector CT imaging of the chest was performed using the standard protocol during bolus administration of intravenous contrast. Multiplanar CT image reconstructions and MIPs were obtained to evaluate the vascular anatomy. Multidetector CT imaging of the abdomen and pelvis was performed using the standard protocol during bolus administration of intravenous contrast. CONTRAST:  151m ISOVUE-370 IOPAMIDOL (ISOVUE-370) INJECTION 76% COMPARISON:  Abdominopelvic CT 07/20/2018. FINDINGS: CTA CHEST FINDINGS Cardiovascular: The pulmonary arteries are well opacified with contrast to the level of the subsegmental branches. There is no evidence of acute pulmonary embolism. There is diffuse atherosclerosis of the aorta, great vessels and coronary arteries status post median sternotomy and CABG. Left subclavian pacemaker leads extend into the right atrium and right ventricle. The heart is mildly enlarged. There is no pericardial effusion. Mediastinum/Nodes: There are no enlarged mediastinal, hilar or axillary lymph nodes. There is a small hiatal hernia. Distal esophageal varices are again noted. The trachea and thyroid gland appear unremarkable. Lungs/Pleura: Trace bilateral pleural effusions. There is no pneumothorax. Mild dependent atelectasis at both lung bases. No confluent airspace opacity or suspicious pulmonary nodule. Musculoskeletal/Chest wall: There is no chest wall  mass or suspicious osseous finding. Review of the MIP images confirms the above findings. CT ABDOMEN and PELVIS FINDINGS Hepatobiliary: There are diffuse morphologic changes of cirrhosis. No focal  hepatic lesion or abnormal enhancement identified. There is a large recanalized left paraumbilical vein. The portal vein is patent. There are multiple calcified gallstones. There is new diffuse gallbladder wall thickening with pericholecystic fluid inferiorly, highly suspicious for acute cholecystitis. No significant biliary dilatation demonstrated. Pancreas: Unremarkable. No pancreatic ductal dilatation or surrounding inflammatory changes. Spleen: Normal in size without focal abnormality. Adrenals/Urinary Tract: Both adrenal glands appear normal. There are tiny low-density renal lesions which are likely cysts. No evidence of urinary tract calculus or hydronephrosis. The bladder appears normal. Stomach/Bowel: The stomach and small bowel demonstrate no significant findings. There is mild wall thickening throughout the proximal colon, likely related to the patient's underlying liver disease. No focal bowel wall thickening, significant distention or surrounding inflammation identified. Mild sigmoid diverticulosis. Vascular/Lymphatic: Mildly prominent lymph nodes in the porta hepatis are stable. There is moderate diffuse aortic and branch vessel atherosclerosis. As above, there is a recanalized paraumbilical vein with multiple large venous collaterals in the anterior abdominal wall and distal esophageal varices. No acute vascular findings are seen. Reproductive: There stable asymmetric calcifications within the left prostate lobe. The seminal vesicles appear normal. Other: There is a small amount of ascites around the liver and in the pelvis. There is mild diffuse mesenteric edema. No focal extraluminal fluid collections are seen. Musculoskeletal: No acute or significant osseous findings. Lower lumbar spondylosis, similar to  previous examination. IMPRESSION: 1. New extensive gallbladder wall thickening and pericholecystic fluid highly suspicious for acute cholecystitis. There are multiple underlying calcified gallstones. No significant biliary dilatation. 2. Cirrhosis and portal hypertension with multiple varices, similar to previous study. 3. Ascites and diffuse mesenteric edema. 4. No acute vascular findings are identified. There is no evidence of acute pulmonary embolism. Aortic Atherosclerosis (ICD10-I70.0). 5. Preliminary results have been discussed with Dr. Ellender Hose. Electronically Signed   By: Richardean Sale M.D.   On: 08/30/2018 10:53   Dg Chest Portable 1 View  Result Date: 08/30/2018 CLINICAL DATA:  Abdominal pain with nausea, vomiting and cough for 3 weeks. History congestive heart failure and pacemaker. EXAM: PORTABLE CHEST 1 VIEW COMPARISON:  03/20/2018 and 09/07/2017 radiographs. FINDINGS: 0732 hour. The left subclavian pacemaker leads appear unchanged within the right atrium and right ventricle. The heart size is stable status post median sternotomy and CABG. The lungs remain clear. There is no pleural effusion or pneumothorax. The bones appear unchanged. Telemetry leads overlie the chest. IMPRESSION: Stable chest post CABG.  No active cardiopulmonary process. Electronically Signed   By: Richardean Sale M.D.   On: 08/30/2018 08:07    Review of Systems  Constitutional: Positive for chills. Negative for weight loss.  HENT: Negative for ear discharge, ear pain, hearing loss and tinnitus.   Eyes: Negative for blurred vision, double vision, photophobia and pain.  Respiratory: Negative for cough, sputum production and shortness of breath.   Cardiovascular: Negative for chest pain.  Gastrointestinal: Positive for abdominal pain, diarrhea, nausea and vomiting.  Genitourinary: Negative for dysuria, flank pain, frequency and urgency.  Musculoskeletal: Negative for back pain, falls, joint pain, myalgias and neck pain.   Neurological: Positive for weakness. Negative for dizziness, tingling, sensory change, focal weakness, loss of consciousness and headaches.  Endo/Heme/Allergies: Does not bruise/bleed easily.  Psychiatric/Behavioral: Negative for depression, memory loss and substance abuse. The patient is not nervous/anxious.    Blood pressure 96/64, pulse 69, temperature (!) 100.5 F (38.1 C), temperature source Rectal, resp. rate (!) 28, height '5\' 6"'$  (1.676 m), weight 96.6 kg, SpO2 100 %. Physical Exam Ill-appearing  male in NAD Eyes:  Pupils equal, round; sclera with mild icterus HENT:  Oral mucosa moist; good dentition  Neck:  No masses palpated, no thyromegaly Lungs:  CTA bilaterally; normal respiratory effort CV:  Regular rate and rhythm; no murmurs; extremities well-perfused with no edema Abd:  +bowel sounds, soft, tender in RUQ and periumbilical region, no palpable organomegaly; Skin:  Warm, dry; no sign of jaundice Psychiatric - alert and oriented x 4; calm mood and affect  Urine - orange-brown color   Assessment/Plan: Probable acute calculus cholecystitis Cirrhosis [facility] C) with hepatic encephalopathy Auto-anticoagulated/ thrombocytopenia secondary to liver disease Hyperbilirubinemia secondary to cirrhosis  Very high surgical risk - over 80% predicted mortality  Would reverse elevated INR with Vit K, possible FFP.  Will likely need platelet transfusion. Would consult IR for percutaneous cholecystostomy tube placement after coags/ platelets improved.    Will follow.    Imogene Burn Samil Mecham 08/30/2018, 12:40 PM

## 2018-08-30 NOTE — Progress Notes (Signed)
Late entry for 1603 Received report from [place] in ED. Spoke Dr. Lindell Noe about need for vasopressors and levophed discontinued, no need for them at this time. IVF ordered at 15. Awaiting patient to come from ED or interventional radiology.

## 2018-08-30 NOTE — ED Notes (Signed)
IR report:  Percutaneous tube placed in gall bladder with 90 mL of fluids drained.  Paracentesis performed with 300 mL fluid removed.  Pt voided ~ 150 mL of red cd colored urine.

## 2018-08-30 NOTE — Progress Notes (Signed)
RT note: RT and RN transported patient on BIPAP to CT and back to ED. Vital signs stable through out.

## 2018-08-30 NOTE — ED Notes (Signed)
ED Provider at bedside. 

## 2018-08-30 NOTE — Progress Notes (Signed)
FPTS Progress Note:   Patient will be admitted to [facility] for Severe Sepsis, likely cholecystitis as etiology. On our assessment, mentating well despite hypotension. Will admit to stepdown, appreciate surgery recs. Surgery recommends attempting to reverse INR, although elevated INR 2/2 liver dysfunction rather than anticoagulation, so no predictable response to vitamin K. On literature review of PubMed and UptoDate, Vit K is not routinely recommended for INR reversal in ESLD patients. Discussed case with Dr. Anselm Pancoast (IR), who will see the patient. We will give FFP 2 units emergent release prior to placement of percutaneous tube, will not give vitamin K at this time. Of note, patient with normal mental status in ED and elects for DNR. Full orders and note to follow.   Ralene Ok, MD PGY3 FM

## 2018-08-30 NOTE — Progress Notes (Signed)
Hunter Carter is a 67 y.o. male patient admitted from ED awake, alert - oriented  X 4 - no acute distress noted.  VSS - Blood pressure (!) 88/64, pulse 69, temperature 97.7 F (36.5 C), temperature source Oral, resp. rate (!) 22, height 5\' 6"  (1.676 m), weight 96.6 kg, SpO2 94 %.    IVs in place, occlusive dsg intact without redness.  Orientation to room, and floor completed with information packet given to patient/family.  Patient declined safety video at this time.  Admission INP armband ID verified with patient/family, and in place.   SR up x 2, fall assessment complete, with patient and family able to verbalize understanding of risk associated with falls, and verbalized understanding to call nsg before up out of bed.  Call light within reach, patient able to voice, and demonstrate understanding.  Skin, clean-dry- intact without evidence of bruising, or skin tears. Percutaneous drain for gallbladder assessed; right lower abdomen; drainage bright red blood.  No evidence of skin break down noted on exam.     Will cont to eval and treat per MD orders.  Howard Pouch, RN 08/30/2018 6:26 PM

## 2018-08-30 NOTE — Procedures (Signed)
  Pre-operative Diagnosis: Acute cholecystitis and sepsis       Post-operative Diagnosis: Acute cholecystitis, concern for gallbladder perforation with infected peritoneal fluid.   Indications: Acute cholecystitis with sepsis.  End stage liver disease and not surgical candidate  Procedure: 1)  US guided paracentesis 2)  Image guided cholecystostomy   Findings: Cloudy amber fluid (300 ml) removed from perihepatic space (concern for underlying gallbladder perforation)  Thick clay colored fluid removed from gallbladder (90 ml)  10 Fr drain in gallbladder.  Gallbladder decompressed with stones at end of procedure   Complications: None     EBL: None  Plan: Send fluids for culture. Follow drain output. Sepsis treatment per primary team.

## 2018-08-30 NOTE — Progress Notes (Signed)
Pharmacy Antibiotic Note  Hunter Carter is a 67 y.o. male admitted on 08/30/2018 with sepsis.  Pharmacy has been consulted for Vanco/Zosyn dosing.  CC/HPI: Abdominal pain with NVD  PMH: hepatic encephalopathy, CAD, CHF, cirrhosis, chronic EtOH abuse   Significant events: Recent hospitalization for cholecystitis  ID: Recent buttock wound and cholecystitis. (discharged on [place] x 7d) Temp 100.5. LA 3.64. WBC 12.5. CXR clear.  Vanco 11/16>> Zosyn 11/16>> Flagyl 11/16>> Cefepime 11/16 x 1  Plan: Vancomycin 750mg  IV q 12h hrs. Trough at steady stae Flagyl 500mg  IV q 8 hrs Zosyn 3.375g IV q 8hr for broader coverage Could narrow anaerobic coverage   Height: 5\' 6"  (167.6 cm) Weight: 212 lb 15.4 oz (96.6 kg) IBW/kg (Calculated) : 63.8  Temp (24hrs), Avg:99.2 F (37.3 C), Min:97.9 F (36.6 C), Max:100.5 F (38.1 C)  Recent Labs  Lab 08/30/18 0705 08/30/18 0719 08/30/18 0938  WBC 12.5*  --   --   CREATININE 1.47*  --   --   LATICACIDVEN  --  4.30* 3.64*    Estimated Creatinine Clearance: 54.5 mL/min (A) (by C-G formula based on SCr of 1.47 mg/dL (H)).    No Known Allergies  Cayleigh Paull S. Alford Highland, PharmD, BCPS Clinical Staff Pharmacist Eilene Ghazi Stillinger 08/30/2018 10:17 AM

## 2018-08-30 NOTE — ED Triage Notes (Signed)
Reports central abdominal pain that started real "heavy" yesterday evening.    Also endorses n/v and diarrhea.

## 2018-08-30 NOTE — ED Notes (Signed)
Admitting at bedside 

## 2018-08-30 NOTE — Progress Notes (Signed)
FPTS Interim Note:   PM check on patient after IR cholecystostomy. Patient feels much improved, family is present, and patient is joking and asking for food. Continues to mentate well. Denies pain.   Vitals:   08/30/18 1640 08/30/18 1718  BP: (!) 87/54 (!) 97/58  Pulse: 70 69  Resp: (!) 22   Temp:  97.7 F (36.5 C)  SpO2: 99% 94%   Sepsis: continue broad spectrum antibiotics. Patient with hypotension although excellent mentation. Continue to monitor and will reconsult CCM if MAP <60. Discussed with patient that we will continue close monitoring and broad spectrum antibiotics.   Ralene Ok, MD PGY 3FM

## 2018-08-30 NOTE — H&P (Addendum)
[facility] Admission History and Physical Service Pager: [phone]  Patient name: Hunter Carter Medical record number: [id number] Date of birth: December 24, 1950 Age: 67 y.o. Gender: male  Primary Care Provider: Nuala Alpha, DO Consultants: Surgery, CCM  Code Status: DNR   Chief Complaint: RUQ pain   Assessment and Plan: Hunter Carter is a 67 y.o. male presenting with diffuse abdominal pain, found to be septic with acute cholecystitis. PMH is significant for HTN, CAD s/p CABG in 2004 with pacemaker in place, paroxysmal atrial tachycardia, liver cirrhosis 2/2 chronic alcohol abuse and treated Hep C with previous hepatic encephalopathy, chronic diastolic CHF, and paroxysmal atrial fibrillation.   Sepsis 2/2 acute cholecystitis: Patient reports few day history of progressive severe RUQ abdominal pain associated with subjective fever, diaphoresis, and nausea, in setting of recent inpatient medical management of acute cholecystitis in 07/2018. Febrile to 100.5, tachypneic, and hypotensive (MAP currently >65). Positive murphy's sign on exam, diffuse abdominal tenderness however without signs of perforation at this time. WBC 63.0 with neutrophilic left shift, lactic acid 4.3 >3.26, Cr 1.47, Bili 3.9, lipase 28. CT abdomen showing extensive gallbladder wall thickening and pericholecystic fluid consistent with acute cholecystitis with several gallstones present. Surgery consulted in the ED, high risk of mortality with surgery, recommending IR consult to discuss percutaneous cholecystostomy. -Admit to stepdown, attending Dr. Mingo Amber -Consult IR, discuss percutaneous cholecystostomy, will give FFP prior to for INR 2  -Vitals per routine -Monitor BP closely, CCM consulted by ED MD and recommended step down at this time   -Cont IV Vancomycin, Zosyn, and flagyl, plan to narrow coverage as appropriate -NPO  -s/p 2L NS in ED, cont mIVF 61ml/hr  -Zofran PRN nausea  -Daily CBC,  CMP -Trend lactic acid   SOB with dry cough: Improving. Reports cough with deep inspiration and increased abdominal pain during coughing/inspiration. Lungs clear on exam. No documented desaturations, was briefly on Bipap in ED 2/2 WOB. Satting well on 4L , no increased WOB on evaluation. CXR without acute cardiopulmonary disease. Likely related to pleurisy given edematous gallbladder. Low concern for HF exacerbation given clear lungs and no lower extremity edema.  - wean O2 as able  -Monitor symptoms -Treatment for gallbladder as above  -Wean oxygen as tolerated   Liver cirrhosis 2/2 previous alcohol abuse and treated Hepatitis C: MELD-Na score 27, 27-32% 90-day mortality. Poor synthetic function: INR 2, albumin 1.7, platelets 61. Ammonia 59, AST/ALT wnl. Follows Roosevelt Locks, NP for cirrhosis. As previously noted, literature review recommends against correction of INR with vit K, as ESLD coagulopathy is a separate entity from reversing oral anticoagulants. Although his ammonia is elevated, patient mentating normally, carrying on long conversation about his medical history, able to give consent and [place] wishes on our exam.  -Hold home lasix in setting of AKI and hypotension  -Hold lactulose in setting of NPO status  -Consideration for adding spironolactone outpatient if stable at that time  -FFP for INR/platelets given hopeful procedure as above   Hypotension in the setting of chronic hypertension: In the setting of sepsis secondary to acute cholecystitis. MAP remaining above 65. S/p 2L fluid bolus in the ED. CCM evaluated in the ED given persistent hypotension, felt stable for stepdown at this time. Takes metoprolol, lasix, and losartan at home for control.  -Hold home medications  -Monitor BP closely, maintain MAP >65  -mIVF 75 ml/hr   AKI: Cr 1.47 on admit, from 0.85 on 08/15/2018. Baseline around 0.9. Likely in the setting of poor perfusion  secondary to acute sepsis.  -Avoid nephrotoxic  medications as possible  -daily BMP  -mIVF 75 ml/hr   Pseudohypocalcemia:  Ca 7.7, albumin 1.7. Corrected 9.5 mg/dl, within normal limits.   Stable, chronic conditions:   Chronic HFpEF: Stable.  Hypo-Euvolemic on exam. No recent echo or cath in records. Follows with Dr. Loletha Grayer.  -Consider echocardiogram in future if medically stable  -Holding home metoprolol and losartan in setting of hypotension and NPO  -Holding home statin as above   Paroxysmal AT and atrial fib with pacemaker in place: Stable.  Follows with Dr. Loletha Grayer outpatient. NSR currently, recent pacemaker interrogation in 07/2018 showing low prevalence (0.7%) of atrial fibrillation. Takes metoprolol daily. Not on anticoagulation, auto anticoagulated due to liver cirrhosis.   -Cardiac monitoring  -Hold home metoprolol as above   CAD s/p CABG in 2004: Stable.  EKG without ischemic changes, troponin <0.03.  -Hold home statin given NPO status, not on secondary prevention with aspirin daily given previous GI bleed and severe liver cirrhosis.  FEN/GI: NPO Prophylaxis: SCDs  Disposition: Admit to stepdown, attending Dr. Mingo Amber   History of Present Illness:  Hunter Carter is a 68 y.o. male presenting with acute RUQ abdominal pain. Past medical history significant for significant liver cirrhosis secondary to previous alcohol abuse and treated hepatitis C and recent hospitalization (discharged 10/11) due to acute cholecystitis treated medically given significant risk with surgical intervention. He reports complete resolution of symptoms following most recent admission, however states abdominal soreness slowly returned over the past few days with further progression over last night waking him from sleep. Endorses several RUQ abdominal pain, worsened with coughing and deep inspiration. Reports subjective fever with diaphoresis, and nausea, however denies any change in bowel movements consistency or color (on lactulose-chronically 3-4 softer/loose  stools daily), vomiting, melena, or hematochezia. Quit alcohol and tobacco use 3 years ago and follows a low sodium diet at home. Follows with Dr. Arrie Aran for his liver cirrhosis.   ED course: On arrival, appeared ill, febrile to 100.5,  Tachypneic to 20-30's, and hypotensive to 70/57. Endorsed shortness of breath and increased pain with coughing, started on Bipap for comfort. Labs notable for  Lactic acid 4.3, Cr 1.47, Na 130,  AST/ALT wnl, total bili 3.9, ammonia 59, WBC 12.5 with left shift, and Hgb 14.4. U/A with SG >1.046, large hgb and >50 RBC. CT abdomen showing evidence of acute cholecystitis without perforation. EKG NSR. CXR clear. Given 2L NS, zofran/fentanyl, and started on cefepime, flagyl, and vancomycin. CCM consulted due to continuous hypotension, evaluated patient at bedside and felt he would be stable for stepdown status at this time. Surgery also consulted, recommending IR consultation for percutaneous cholecystostomy. On our evaluation, off of bipap, breathing without concern and satting well on [place].   Review Of Systems: Per HPI with the following additions:   Review of Systems  Constitutional: Positive for diaphoresis and fever. Negative for chills.  HENT: Negative for congestion and sore throat.   Eyes: Negative for blurred vision.  Respiratory: Positive for cough and shortness of breath. Negative for hemoptysis, sputum production and wheezing.   Cardiovascular: Negative for chest pain, palpitations, orthopnea, claudication and leg swelling.  Gastrointestinal: Positive for abdominal pain, diarrhea and nausea. Negative for blood in stool, melena and vomiting.  Genitourinary: Negative for dysuria and urgency.  Musculoskeletal: Negative for myalgias.  Neurological: Positive for headaches. Negative for focal weakness and loss of consciousness.  Psychiatric/Behavioral: Negative for substance abuse and suicidal ideas. The patient is not nervous/anxious.  Patient Active Problem List    Diagnosis Date Noted  . Sepsis ([place]) 08/30/2018  . Boil of buttock 08/20/2018  . Calculus of gallbladder with acute cholecystitis 08/20/2018  . Abdominal pain 07/20/2018  . Acute hepatic encephalopathy 03/20/2018  . Gastroesophageal reflux disease 03/11/2018  . CAD of autologous vein bypass graft without angina 03/01/2018  . Healthcare maintenance 02/13/2018  . Skin lesion 02/13/2018  . Cirrhosis ([place]) 08/08/2016  . Paroxysmal atrial fibrillation ([place]) 01/05/2016  . Tobacco abuse 01/05/2016  . Hepatic encephalopathy ([place]) 10/10/2015  . Pain, joint, ankle, left 10/10/2015  . Thrombocytopathia ([place]) 10/10/2015  . HLD (hyperlipidemia) 10/10/2015  . Chronic diastolic congestive heart failure ([place]) 10/10/2015  . Altered mental status   . Increased ammonia level   . Leg swelling   . Encephalopathy acute 08/01/2015  . GI bleed 07/29/2015  . Acute GI bleeding 07/29/2015  . Acute respiratory failure ([place])   . Bleeding gastrointestinal   . Hemorrhagic shock ([place])   . CAD s/p CABG 2004 07/20/2013  . Pacemaker - dual chamber Medtronic 2011 07/20/2013  . Hepatitis C 07/20/2013  . Hyperlipidemia 07/20/2013  . PAT (paroxysmal atrial tachycardia) ([place]) 07/20/2013  . Obesity (BMI 30-39.9) 07/20/2013  . NSVT (nonsustained ventricular tachycardia) ([place]) 07/20/2013  . HTN (hypertension) 07/16/2013    Past Medical History: Past Medical History:  Diagnosis Date  . Acute hepatic encephalopathy 10/10/2015   [name] 10/10/2015  . Arthritis    "joints ache" (10/10/2015)  . Bleeding stomach ulcer    "recently" (10/10/2015)  . CAD (coronary artery disease)   . CHF (congestive heart failure) ([place])   . Cirrhosis ([place])    due to HCV/notes 10/10/2015  . ETOH abuse   . GERD (gastroesophageal reflux disease)   . Heart block AV second degree    permanent pacemaker 02/2004  . Hepatitis C   . Hyperlipidemia   . Inferior MI ([place]) 2005  . Presence of permanent cardiac pacemaker   . RBBB   .  Systemic hypertension   . Tubulovillous adenoma of colon   . Ventricular tachycardia ([facility])    nonsustained    Past Surgical History: Past Surgical History:  Procedure Laterality Date  . CARDIAC CATHETERIZATION  03/06/04   significant 3 vessel disease, totalled graft obtuse marginal  . CORONARY ARTERY BYPASS GRAFT  02/10/03   LIMA to LAD,SVG to first diagonal, SVT to OM1  . ESOPHAGOGASTRODUODENOSCOPY N/A 07/29/2015   Procedure: ESOPHAGOGASTRODUODENOSCOPY (EGD);  Surgeon: Clarene Essex, MD;  Location: [facility] ENDOSCOPY;  Service: Endoscopy;  Laterality: N/A;  . INSERT / REPLACE / REMOVE PACEMAKER  03/2010   Medtronic  . KNEE ARTHROSCOPY Left [phone]  . MEDIASTINAL EXPLORATION  02/10/2003  . NM MYOCAR PERF WALL MOTION  04/05/09   low risk scan-abnormal-mod. perfusion defect basal inferoseptal,basal inferior,mid inferoseptal,mid inferior and apical inferior regions  . TOTAL KNEE ARTHROPLASTY Left   . US ECHOCARDIOGRAPHY  01/20/07   mild MR,mild to mod. TR,trace AI,mild PI    Social History: Social History   Tobacco Use  . Smoking status: Former Smoker    Packs/day: 0.25    Years: 41.00    Pack years: 10.25    Types: Cigarettes    Last attempt to quit: 11/15/2017    Years since quitting: 0.7  . Smokeless tobacco: Never Used  Substance Use Topics  . Alcohol use: Yes    Comment: 10/10/2015 "last drink was 2-3 months ago; h/o abuse"  . Drug use: Yes    Types: Marijuana  Comment: last use 2015   Please also refer to relevant sections of EMR.  Family History: Family History  Problem Relation Age of Onset  . Heart failure Mother   . Liver disease Father   . Heart failure Sister   . Hypertension Sister   . Hypertension Sister   . Colon cancer Neg Hx    Allergies and Medications: No Known Allergies No current facility-administered medications on file prior to encounter.    Current Outpatient Medications on File Prior to Encounter  Medication Sig Dispense Refill  . furosemide  (LASIX) 40 MG tablet TAKE 1 TABLET BY MOUTH  DAILY AS NEEDED (Patient taking differently: Take 40 mg by mouth daily. Make take an additional 40 mg if needed) 90 tablet 0  . lactulose (CHRONULAC) 10 GM/15ML solution Take 45 mLs (30 g total) by mouth 2 (two) times daily. Titrate for 3-4 BM's/ day (Patient taking differently: Take 30 g by mouth 3 (three) times daily. Titrate for 3-4 BM's/ day) 240 mL 5  . losartan (COZAAR) 25 MG tablet TAKE 1 TABLET BY MOUTH  DAILY (Patient taking differently: Take 25 mg by mouth daily. ) 90 tablet 0  . metoprolol tartrate (LOPRESSOR) 50 MG tablet TAKE 1 TABLET BY MOUTH TWO  TIMES DAILY (Patient taking differently: Take 50 mg by mouth daily. ) 180 tablet 3  . omeprazole (PRILOSEC) 20 MG capsule TAKE 1 CAPSULE BY MOUTH  DAILY (Patient taking differently: Take 20 mg by mouth daily. ) 30 capsule 3  . PROAIR HFA 108 (90 Base) MCG/ACT inhaler USE 2 PUFFS BY MOUTH EVERY  4 HOURS AS NEEDED FOR  WHEEZING OR SHORTNESS OF  BREATH (OR COUGHING) (Patient taking differently: Inhale 1-2 puffs into the lungs every 4 (four) hours as needed for wheezing. ) 8.5 g 2  . simvastatin (ZOCOR) 20 MG tablet TAKE 1 TABLET BY MOUTH  DAILY (Patient taking differently: Take 20 mg by mouth daily at 6 PM. ) 90 tablet 0    Objective: BP (!) 91/56   Pulse 70   Temp (!) 100.5 F (38.1 C) (Rectal)   Resp (!) 25   Ht 5\' 6"  (1.676 m)   Wt 96.6 kg   SpO2 100%   BMI 34.37 kg/m  Exam: General: Alert, NAD HEENT: NCAT, MMM dry  Cardiac: Faint heart sounds, RRR no m/g/r Lungs: Clear anteriorly, no increased WOB on 4L [place], satting 96-100%  Abdomen: soft, diffuse tenderness with palpation, most prominent in RUQ, Slightly distended, with hypoactive bowel sounds. Murphy's sign positive. No guarding or rigidity.  Msk: Moves all extremities spontaneously  Ext: Warm, dry, 2+ distal pulses, no edema  Neuro: No focal deficits noted on exam, A&O x3.  Psych: normal affect    Labs and Imaging: CBC BMET   Recent Labs  Lab 08/30/18 0705  WBC 12.5*  HGB 14.4  HCT 44.6  PLT 61*   Recent Labs  Lab 08/30/18 0705  NA 131*  K 4.3  CL 105  CO2 18*  BUN 8  CREATININE 1.47*  GLUCOSE 112*  CALCIUM 7.7Patriciaann Clan, DO 08/30/2018, 1:43 PM PGY-1, [place] Intern pager: [phone], text pages welcome  FPTS Upper-Level Resident Addendum   I have independently interviewed and examined the patient. I have discussed the above with the original author and agree with their documentation. My edits for correction/addition/clarification are in blue. Please see also any attending notes.    Ralene Ok, MD PGY-3,  [place] Service pager: [phone] (text pages welcome through [place])

## 2018-08-30 NOTE — ED Notes (Signed)
PAGED ADMITTING 

## 2018-08-30 NOTE — Sedation Documentation (Signed)
paracentesis and cholecystostomy both completed

## 2018-08-31 ENCOUNTER — Encounter (HOSPITAL_COMMUNITY): Payer: Self-pay | Admitting: Diagnostic Radiology

## 2018-08-31 ENCOUNTER — Inpatient Hospital Stay (HOSPITAL_COMMUNITY): Payer: Medicare Other

## 2018-08-31 DIAGNOSIS — K7460 Unspecified cirrhosis of liver: Secondary | ICD-10-CM

## 2018-08-31 DIAGNOSIS — R6520 Severe sepsis without septic shock: Secondary | ICD-10-CM

## 2018-08-31 DIAGNOSIS — R188 Other ascites: Secondary | ICD-10-CM

## 2018-08-31 DIAGNOSIS — E875 Hyperkalemia: Secondary | ICD-10-CM

## 2018-08-31 DIAGNOSIS — K810 Acute cholecystitis: Secondary | ICD-10-CM

## 2018-08-31 DIAGNOSIS — K717 Toxic liver disease with fibrosis and cirrhosis of liver: Secondary | ICD-10-CM

## 2018-08-31 DIAGNOSIS — A419 Sepsis, unspecified organism: Principal | ICD-10-CM

## 2018-08-31 LAB — PREPARE FRESH FROZEN PLASMA: Unit division: 0

## 2018-08-31 LAB — CBC
HCT: 35.7 % — ABNORMAL LOW (ref 39.0–52.0)
Hemoglobin: 11.9 g/dL — ABNORMAL LOW (ref 13.0–17.0)
MCH: 32.7 pg (ref 26.0–34.0)
MCHC: 33.3 g/dL (ref 30.0–36.0)
MCV: 98.1 fL (ref 80.0–100.0)
NRBC: 0 % (ref 0.0–0.2)
PLATELETS: 57 10*3/uL — AB (ref 150–400)
RBC: 3.64 MIL/uL — AB (ref 4.22–5.81)
RDW: 14 % (ref 11.5–15.5)
WBC: 11.4 10*3/uL — AB (ref 4.0–10.5)

## 2018-08-31 LAB — BPAM FFP
BLOOD PRODUCT EXPIRATION DATE: 201911192359
BLOOD PRODUCT EXPIRATION DATE: 201911202359
ISSUE DATE / TIME: 201911161420
ISSUE DATE / TIME: 201911161524
UNIT TYPE AND RH: 6200
Unit Type and Rh: 6200

## 2018-08-31 LAB — BASIC METABOLIC PANEL
Anion gap: 5 (ref 5–15)
BUN: 17 mg/dL (ref 8–23)
CALCIUM: 7.8 mg/dL — AB (ref 8.9–10.3)
CO2: 22 mmol/L (ref 22–32)
CREATININE: 1.23 mg/dL (ref 0.61–1.24)
Chloride: 104 mmol/L (ref 98–111)
GFR calc non Af Amer: 60 mL/min — ABNORMAL LOW (ref >60)
Glucose, Bld: 103 mg/dL — ABNORMAL HIGH (ref 70–99)
Potassium: 5.8 mmol/L — ABNORMAL HIGH (ref 3.5–5.1)
SODIUM: 131 mmol/L — AB (ref 135–145)

## 2018-08-31 LAB — COMPREHENSIVE METABOLIC PANEL
ALK PHOS: 79 U/L (ref 38–126)
ALK PHOS: 71 U/L (ref 38–126)
ALT: 15 U/L (ref 0–44)
ALT: 16 U/L (ref 0–44)
ANION GAP: 5 (ref 5–15)
AST: 32 U/L (ref 15–41)
AST: 33 U/L (ref 15–41)
Albumin: 1.6 g/dL — ABNORMAL LOW (ref 3.5–5.0)
Albumin: 1.5 g/dL — ABNORMAL LOW (ref 3.5–5.0)
Anion gap: 5 (ref 5–15)
BILIRUBIN TOTAL: 5.1 mg/dL — AB (ref 0.3–1.2)
BILIRUBIN TOTAL: 4.7 mg/dL — AB (ref 0.3–1.2)
BUN: 15 mg/dL (ref 8–23)
BUN: 17 mg/dL (ref 8–23)
CALCIUM: 7.8 mg/dL — AB (ref 8.9–10.3)
CALCIUM: 7.8 mg/dL — AB (ref 8.9–10.3)
CHLORIDE: 102 mmol/L (ref 98–111)
CO2: 23 mmol/L (ref 22–32)
CO2: 22 mmol/L (ref 22–32)
CREATININE: 1.31 mg/dL — AB (ref 0.61–1.24)
CREATININE: 1.26 mg/dL — AB (ref 0.61–1.24)
Chloride: 105 mmol/L (ref 98–111)
GFR, EST NON AFRICAN AMERICAN: 56 mL/min — AB (ref >60)
GFR, EST NON AFRICAN AMERICAN: 58 mL/min — AB (ref >60)
Glucose, Bld: 87 mg/dL (ref 70–99)
Glucose, Bld: 146 mg/dL — ABNORMAL HIGH (ref 70–99)
Potassium: 5.4 mmol/L — ABNORMAL HIGH (ref 3.5–5.1)
Potassium: 4.5 mmol/L (ref 3.5–5.1)
Sodium: 133 mmol/L — ABNORMAL LOW (ref 135–145)
Sodium: 129 mmol/L — ABNORMAL LOW (ref 135–145)
TOTAL PROTEIN: 7.2 g/dL (ref 6.5–8.1)
TOTAL PROTEIN: 7.3 g/dL (ref 6.5–8.1)

## 2018-08-31 LAB — URINE CULTURE: Culture: NO GROWTH

## 2018-08-31 LAB — LACTIC ACID, PLASMA: LACTIC ACID, VENOUS: 3.2 mmol/L — AB (ref 0.5–1.9)

## 2018-08-31 LAB — PROTIME-INR
INR: 2.41
PROTHROMBIN TIME: 25.9 s — AB (ref 11.4–15.2)

## 2018-08-31 MED ORDER — DOCUSATE SODIUM 100 MG PO CAPS
100.0000 mg | ORAL_CAPSULE | Freq: Two times a day (BID) | ORAL | Status: DC
  Administered 2018-08-31 – 2018-09-10 (×17): 100 mg via ORAL
  Filled 2018-08-31 (×20): qty 1

## 2018-08-31 MED ORDER — LIP MEDEX EX OINT
TOPICAL_OINTMENT | CUTANEOUS | Status: DC | PRN
  Filled 2018-08-31: qty 7

## 2018-08-31 NOTE — Progress Notes (Signed)
  Progress Note: General Surgery Service   Assessment/Plan: Active Problems:   Sepsis ([place])  Acute calculus cholecystitis in setting of advanced cirrhosis. WBC 11 from 12 -successful cholecystostomy (IR) yesterday -rec continued antibiotics -can advance diet per primary    LOS: 1 day  Chief Complaint/Subjective: Pain moderate, tolerated some liquids  Objective: Vital signs in last 24 hours: Temp:  [97.6 F (36.4 C)-98.7 F (37.1 C)] 98.7 F (37.1 C) (11/17 0749) Pulse Rate:  [65-79] 77 (11/17 0749) Resp:  [20-36] 22 (11/17 0749) BP: (77-110)/(42-85) 104/64 (11/17 0749) SpO2:  [92 %-100 %] 95 % (11/17 0749) Last BM Date: 08/30/18  Intake/Output from previous day: 11/16 0701 - 11/17 0700 In: 3112.5 [I.V.:327; Blood:96.8; IV Piggyback:2688.7] Out: 345 [Urine:220; Drains:125] Intake/Output this shift: Total I/O In: 167.3 [IV Piggyback:167.3] Out: 100 [Urine:100]  Lungs: nonlabored breathing  Cardiovascular: RRR  Abd: soft, distended, tenderness right side  Extremities: no edema  Neuro: answers simple questions appropriately  Lab Results: CBC  Recent Labs    08/30/18 0705 08/31/18 0429  WBC 12.5* 11.4*  HGB 14.4 11.9*  HCT 44.6 35.7*  PLT 61* 57*   BMET Recent Labs    08/31/18 0429 08/31/18 0811  NA 133* 131*  K 5.4* 5.8*  CL 105 104  CO2 23 22  GLUCOSE 87 103*  BUN 15 17  CREATININE 1.31* 1.23  CALCIUM 7.8* 7.8*   PT/INR Recent Labs    08/30/18 0705 08/31/18 0429  LABPROT 23.1* 25.9*  INR 2.08 2.41   ABG No results for input(s): PHART, HCO3 in the last 72 hours.  Invalid input(s): PCO2, PO2  Studies/Results:  Anti-infectives: Anti-infectives (From admission, onward)   Start     Dose/Rate Route Frequency Ordered Stop   08/30/18 2000  piperacillin-tazobactam (ZOSYN) IVPB 3.375 g     3.375 g 12.5 mL/hr over 240 Minutes Intravenous Every 8 hours 08/30/18 1015     08/30/18 1100  vancomycin (VANCOCIN) IVPB 750 mg/150 ml premix     750 mg 150 mL/hr over 60 Minutes Intravenous Every 12 hours 08/30/18 1016     08/30/18 0715  ceFEPIme (MAXIPIME) 2 g in sodium chloride 0.9 % 100 mL IVPB     2 g 200 mL/hr over 30 Minutes Intravenous  Once 08/30/18 0713 08/30/18 0829   08/30/18 0715  metroNIDAZOLE (FLAGYL) IVPB 500 mg     500 mg 100 mL/hr over 60 Minutes Intravenous Every 8 hours 08/30/18 0713        Medications: Scheduled Meds: Continuous Infusions: . sodium chloride 250 mL (08/30/18 1239)  . metronidazole Stopped (08/31/18 0721)  . piperacillin-tazobactam (ZOSYN)  IV 3.375 g (08/31/18 0431)  . vancomycin 150 mL/hr at 08/31/18 1019   PRN Meds:.albuterol, benzonatate, morphine injection  Mickeal Skinner, MD Pg# [phone] [facility]

## 2018-08-31 NOTE — Progress Notes (Signed)
FPTS Interim Progress Note  S:PM recheck 2/2 labs from AM. Patient sitting up in chair, feels better than this morning. Reports more family is coming to visit this am. Got a nap. A little sleepy, but can carry on conversation (RN states he just got morphine 15 min prior).   O: BP 121/79   Pulse 83   Temp 98.4 F (36.9 C) (Oral)   Resp (!) 24   Ht 5\' 6"  (1.676 m)   Wt 96.6 kg   SpO2 94%   BMI 34.37 kg/m   Gen: elderly male sitting up in chair in no acute distress.  CV: RRR, no murmur Resp: normal WOB Abd: distended, TTP around drain site, normal BS  A/P: Sepsis 2/2 cholecystitis: patient exam and pain improved from this am. Lactate decreased from previous, likely continued elevation 2/2 ESLD and AKI.  -advance to soft diet -continue antibiotics  Hyperkalemia: resolved on repeat labs. Continue daily labs. EKG with no new changes, old RBBB.   -daily labs  Sela Hilding, MD 08/31/2018, 1:10 PM PGY-3, [place] Medicine Service pager [phone]

## 2018-08-31 NOTE — Progress Notes (Signed)
Chief Complaint: Patient was seen today for perc chole drain   Supervising Physician: Markus Daft  Patient Status: [facility] - In-pt  Subjective: S/p perc chole yesterday Feeling better today Tolerating some clears. A little sore at drain site  Objective: Physical Exam: BP 104/64   Pulse 77   Temp 98.7 F (37.1 C) (Oral)   Resp (!) 22   Ht 5\' 6"  (1.676 m)   Wt 96.6 kg   SpO2 95%   BMI 34.37 kg/m  RUQ drain intact. Serosanguinous output with some purulent stranding.   Current Facility-Administered Medications:  .  0.9 %  sodium chloride infusion, 250 mL, Intravenous, Continuous, Sela Hilding, MD, Last Rate: 20 mL/hr at 08/30/18 1239, 250 mL at 08/30/18 1239 .  albuterol (PROVENTIL) (2.5 MG/3ML) 0.083% nebulizer solution 3 mL, 3 mL, Inhalation, Q4H PRN, Sela Hilding, MD .  benzonatate (TESSALON) capsule 200 mg, 200 mg, Oral, TID PRN, Vanetta Shawl, Angela C, DO .  metroNIDAZOLE (FLAGYL) IVPB 500 mg, 500 mg, Intravenous, Q8H, Sela Hilding, MD, Last Rate: 100 mL/hr at 08/31/18 0621, 500 mg at 08/31/18 0621 .  morphine 2 MG/ML injection 2 mg, 2 mg, Intravenous, Q4H PRN, Riccio, Angela C, DO, 2 mg at 08/31/18 0729 .  piperacillin-tazobactam (ZOSYN) IVPB 3.375 g, 3.375 g, Intravenous, Q8H, Sela Hilding, MD, Last Rate: 12.5 mL/hr at 08/31/18 0431, 3.375 g at 08/31/18 0431 .  vancomycin (VANCOCIN) IVPB 750 mg/150 ml premix, 750 mg, Intravenous, Q12H, Sela Hilding, MD, Stopped at 08/30/18 2323  Labs: CBC Recent Labs    08/30/18 0705 08/31/18 0429  WBC 12.5* 11.4*  HGB 14.4 11.9*  HCT 44.6 35.7*  PLT 61* 57*   BMET Recent Labs    08/31/18 0429 08/31/18 0811  NA 133* 131*  K 5.4* 5.8*  CL 105 104  CO2 23 22  GLUCOSE 87 103*  BUN 15 17  CREATININE 1.31* 1.23  CALCIUM 7.8* 7.8*   LFT Recent Labs    08/30/18 0705 08/31/18 0429  PROT 8.5* 7.2  ALBUMIN 1.7* 1.6*  AST 41 32  ALT 17 15  ALKPHOS 92 79  BILITOT 3.9* 5.1*  LIPASE 28  --      PT/INR Recent Labs    08/30/18 0705 08/31/18 0429  LABPROT 23.1* 25.9*  INR 2.08 2.41     Studies/Results: Dg Abd 1 View  Result Date: 08/31/2018 CLINICAL DATA:  Sepsis EXAM: ABDOMEN - 1 VIEW COMPARISON:  08/30/2018 FINDINGS: Scattered large and small bowel gas is noted. Contrast material is noted within the bladder from previous CT examination. Multiple gallstones and cholecystostomy tube are noted in the right upper quadrant. No free air is seen. Degenerative changes of lumbar spine are noted. IMPRESSION: Cholelithiasis and cholecystostomy tube. No acute abnormality noted. Electronically Signed   By: Inez Catalina M.D.   On: 08/31/2018 09:36   Ct Angio Chest Pe W Or Wo Contrast  Result Date: 08/30/2018 CLINICAL DATA:  Central abdominal pain and pressure since last night. History of myocardial infarction and cirrhosis. EXAM: CT ANGIOGRAPHY CHEST CT ABDOMEN AND PELVIS WITH CONTRAST TECHNIQUE: Multidetector CT imaging of the chest was performed using the standard protocol during bolus administration of intravenous contrast. Multiplanar CT image reconstructions and MIPs were obtained to evaluate the vascular anatomy. Multidetector CT imaging of the abdomen and pelvis was performed using the standard protocol during bolus administration of intravenous contrast. CONTRAST:  183mL ISOVUE-370 IOPAMIDOL (ISOVUE-370) INJECTION 76% COMPARISON:  Abdominopelvic CT 07/20/2018. FINDINGS: CTA CHEST FINDINGS Cardiovascular: The pulmonary arteries  are well opacified with contrast to the level of the subsegmental branches. There is no evidence of acute pulmonary embolism. There is diffuse atherosclerosis of the aorta, great vessels and coronary arteries status post median sternotomy and CABG. Left subclavian pacemaker leads extend into the right atrium and right ventricle. The heart is mildly enlarged. There is no pericardial effusion. Mediastinum/Nodes: There are no enlarged mediastinal, hilar or axillary lymph  nodes. There is a small hiatal hernia. Distal esophageal varices are again noted. The trachea and thyroid gland appear unremarkable. Lungs/Pleura: Trace bilateral pleural effusions. There is no pneumothorax. Mild dependent atelectasis at both lung bases. No confluent airspace opacity or suspicious pulmonary nodule. Musculoskeletal/Chest wall: There is no chest wall mass or suspicious osseous finding. Review of the MIP images confirms the above findings. CT ABDOMEN and PELVIS FINDINGS Hepatobiliary: There are diffuse morphologic changes of cirrhosis. No focal hepatic lesion or abnormal enhancement identified. There is a large recanalized left paraumbilical vein. The portal vein is patent. There are multiple calcified gallstones. There is new diffuse gallbladder wall thickening with pericholecystic fluid inferiorly, highly suspicious for acute cholecystitis. No significant biliary dilatation demonstrated. Pancreas: Unremarkable. No pancreatic ductal dilatation or surrounding inflammatory changes. Spleen: Normal in size without focal abnormality. Adrenals/Urinary Tract: Both adrenal glands appear normal. There are tiny low-density renal lesions which are likely cysts. No evidence of urinary tract calculus or hydronephrosis. The bladder appears normal. Stomach/Bowel: The stomach and small bowel demonstrate no significant findings. There is mild wall thickening throughout the proximal colon, likely related to the patient's underlying liver disease. No focal bowel wall thickening, significant distention or surrounding inflammation identified. Mild sigmoid diverticulosis. Vascular/Lymphatic: Mildly prominent lymph nodes in the porta hepatis are stable. There is moderate diffuse aortic and branch vessel atherosclerosis. As above, there is a recanalized paraumbilical vein with multiple large venous collaterals in the anterior abdominal wall and distal esophageal varices. No acute vascular findings are seen. Reproductive:  There stable asymmetric calcifications within the left prostate lobe. The seminal vesicles appear normal. Other: There is a small amount of ascites around the liver and in the pelvis. There is mild diffuse mesenteric edema. No focal extraluminal fluid collections are seen. Musculoskeletal: No acute or significant osseous findings. Lower lumbar spondylosis, similar to previous examination. IMPRESSION: 1. New extensive gallbladder wall thickening and pericholecystic fluid highly suspicious for acute cholecystitis. There are multiple underlying calcified gallstones. No significant biliary dilatation. 2. Cirrhosis and portal hypertension with multiple varices, similar to previous study. 3. Ascites and diffuse mesenteric edema. 4. No acute vascular findings are identified. There is no evidence of acute pulmonary embolism. Aortic Atherosclerosis (ICD10-I70.0). 5. Preliminary results have been discussed with Dr. Ellender Hose. Electronically Signed   By: Richardean Sale M.D.   On: 08/30/2018 10:53   Ct Abdomen Pelvis W Contrast  Result Date: 08/30/2018 CLINICAL DATA:  Central abdominal pain and pressure since last night. History of myocardial infarction and cirrhosis. EXAM: CT ANGIOGRAPHY CHEST CT ABDOMEN AND PELVIS WITH CONTRAST TECHNIQUE: Multidetector CT imaging of the chest was performed using the standard protocol during bolus administration of intravenous contrast. Multiplanar CT image reconstructions and MIPs were obtained to evaluate the vascular anatomy. Multidetector CT imaging of the abdomen and pelvis was performed using the standard protocol during bolus administration of intravenous contrast. CONTRAST:  183mL ISOVUE-370 IOPAMIDOL (ISOVUE-370) INJECTION 76% COMPARISON:  Abdominopelvic CT 07/20/2018. FINDINGS: CTA CHEST FINDINGS Cardiovascular: The pulmonary arteries are well opacified with contrast to the level of the subsegmental branches. There is no evidence  of acute pulmonary embolism. There is diffuse  atherosclerosis of the aorta, great vessels and coronary arteries status post median sternotomy and CABG. Left subclavian pacemaker leads extend into the right atrium and right ventricle. The heart is mildly enlarged. There is no pericardial effusion. Mediastinum/Nodes: There are no enlarged mediastinal, hilar or axillary lymph nodes. There is a small hiatal hernia. Distal esophageal varices are again noted. The trachea and thyroid gland appear unremarkable. Lungs/Pleura: Trace bilateral pleural effusions. There is no pneumothorax. Mild dependent atelectasis at both lung bases. No confluent airspace opacity or suspicious pulmonary nodule. Musculoskeletal/Chest wall: There is no chest wall mass or suspicious osseous finding. Review of the MIP images confirms the above findings. CT ABDOMEN and PELVIS FINDINGS Hepatobiliary: There are diffuse morphologic changes of cirrhosis. No focal hepatic lesion or abnormal enhancement identified. There is a large recanalized left paraumbilical vein. The portal vein is patent. There are multiple calcified gallstones. There is new diffuse gallbladder wall thickening with pericholecystic fluid inferiorly, highly suspicious for acute cholecystitis. No significant biliary dilatation demonstrated. Pancreas: Unremarkable. No pancreatic ductal dilatation or surrounding inflammatory changes. Spleen: Normal in size without focal abnormality. Adrenals/Urinary Tract: Both adrenal glands appear normal. There are tiny low-density renal lesions which are likely cysts. No evidence of urinary tract calculus or hydronephrosis. The bladder appears normal. Stomach/Bowel: The stomach and small bowel demonstrate no significant findings. There is mild wall thickening throughout the proximal colon, likely related to the patient's underlying liver disease. No focal bowel wall thickening, significant distention or surrounding inflammation identified. Mild sigmoid diverticulosis. Vascular/Lymphatic: Mildly  prominent lymph nodes in the porta hepatis are stable. There is moderate diffuse aortic and branch vessel atherosclerosis. As above, there is a recanalized paraumbilical vein with multiple large venous collaterals in the anterior abdominal wall and distal esophageal varices. No acute vascular findings are seen. Reproductive: There stable asymmetric calcifications within the left prostate lobe. The seminal vesicles appear normal. Other: There is a small amount of ascites around the liver and in the pelvis. There is mild diffuse mesenteric edema. No focal extraluminal fluid collections are seen. Musculoskeletal: No acute or significant osseous findings. Lower lumbar spondylosis, similar to previous examination. IMPRESSION: 1. New extensive gallbladder wall thickening and pericholecystic fluid highly suspicious for acute cholecystitis. There are multiple underlying calcified gallstones. No significant biliary dilatation. 2. Cirrhosis and portal hypertension with multiple varices, similar to previous study. 3. Ascites and diffuse mesenteric edema. 4. No acute vascular findings are identified. There is no evidence of acute pulmonary embolism. Aortic Atherosclerosis (ICD10-I70.0). 5. Preliminary results have been discussed with Dr. Ellender Hose. Electronically Signed   By: Richardean Sale M.D.   On: 08/30/2018 10:53   Ir Perc Cholecystostomy  Result Date: 08/30/2018 INDICATION: 67 year old with acute cholecystitis, sepsis and end-stage liver disease. Patient is not a surgical candidate due to his liver disease. Plan for percutaneous cholecystostomy tube placement. Patient was given FFP prior to the procedure. EXAM: IMAGE GUIDED PERCUTANEOUS CHOLECYSTOSTOMY TUBE PLACEMENT ULTRASOUND-GUIDED PARACENTESIS MEDICATIONS: Patient received IV antibiotics in the emergency department prior to the procedure. ANESTHESIA/SEDATION: Moderate (conscious) sedation was employed during this procedure. A total of Versed 1.5 mg and Fentanyl 75  mcg was administered intravenously. Moderate Sedation Time: 13 minutes. The patient's level of consciousness and vital signs were monitored continuously by radiology nursing throughout the procedure under my direct supervision. FLUOROSCOPY TIME:  Fluoroscopy Time: 42 seconds, 9 mGy COMPLICATIONS: None immediate. PROCEDURE: Informed written consent was obtained from the patient after a thorough discussion of the procedural  risks, benefits and alternatives. All questions were addressed. A timeout was performed prior to the initiation of the procedure. Patient was placed supine on the interventional table. Gallbladder and liver was evaluated with ultrasound. Right abdomen was prepped and draped in sterile fashion. Maximal barrier sterile technique was utilized including caps, mask, sterile gowns, sterile gloves, sterile drape, hand hygiene and skin antiseptic. Skin was anesthetized with 1% lidocaine. Due to the perihepatic ascites, a Safe-T-Centesis catheter was directed in the perihepatic space with ultrasound guidance. 300 mL of cloudy amber colored fluid was removed. Attention was directed to the cholecystostomy tube placement. After the paracentesis was complete, the skin was anesthetized with 1% lidocaine near the gallbladder. 21 gauge needle was directed into the gallbladder fundus with ultrasound guidance. 0.018 wire was advanced into the gallbladder. The tract was dilated with an Accustick set. A J wire was advanced into the gallbladder and the tract was dilated to accommodate a 10.2 [place] multipurpose drain. 90 mL of clay colored purulent looking fluid was removed from the gallbladder. The gallbladder was decompressed based on ultrasound at the end of the procedure. Catheter was sutured to skin and attached to a gravity bag. Paracentesis catheter was also removed at the end of the procedure. FINDINGS: Initial ultrasound images demonstrate small to moderate amount of perihepatic ascites and fluid around the  gallbladder. Therefore, a paracentesis was performed and majority of the perihepatic fluid was removed. 300 mL of cloudy amber colored fluid removed from the perihepatic space. The fluid appearance was concerning for infection. Gallbladder wall was markedly thickened. Multiple echogenic stones. Catheter was successfully advanced into the gallbladder and the gallbladder was decompressed. 90 mL of clay colored purulent looking fluid was removed from the gallbladder. Gallbladder was decompressed at end of the procedure. IMPRESSION: Successful image guided percutaneous cholecystostomy tube placement. In addition, 300 mL perihepatic ascites was removed during the procedure. Appearance of the peritoneal fluid is concerning for infection and underlying gallbladder perforation. Gallbladder fluid and peritoneal fluid was sent for culture. Electronically Signed   By: Markus Daft M.D.   On: 08/30/2018 17:05   Dg Chest Portable 1 View  Result Date: 08/30/2018 CLINICAL DATA:  Abdominal pain with nausea, vomiting and cough for 3 weeks. History congestive heart failure and pacemaker. EXAM: PORTABLE CHEST 1 VIEW COMPARISON:  03/20/2018 and 09/07/2017 radiographs. FINDINGS: 0732 hour. The left subclavian pacemaker leads appear unchanged within the right atrium and right ventricle. The heart size is stable status post median sternotomy and CABG. The lungs remain clear. There is no pleural effusion or pneumothorax. The bones appear unchanged. Telemetry leads overlie the chest. IMPRESSION: Stable chest post CABG.  No active cardiopulmonary process. Electronically Signed   By: Richardean Sale M.D.   On: 08/30/2018 08:07   Ir Paracentesis  Result Date: 08/31/2018 INDICATION: 67 year old with acute cholecystitis, sepsis and end-stage liver disease. Patient is not a surgical candidate due to his liver disease. Plan for percutaneous cholecystostomy tube placement. Patient was given FFP prior to the procedure. EXAM: IMAGE GUIDED  PERCUTANEOUS CHOLECYSTOSTOMY TUBE PLACEMENT ULTRASOUND-GUIDED PARACENTESIS MEDICATIONS: Patient received IV antibiotics in the emergency department prior to the procedure. ANESTHESIA/SEDATION: Moderate (conscious) sedation was employed during this procedure. A total of Versed 1.5 mg and Fentanyl 75 mcg was administered intravenously. Moderate Sedation Time: 13 minutes. The patient's level of consciousness and vital signs were monitored continuously by radiology nursing throughout the procedure under my direct supervision. FLUOROSCOPY TIME:  Fluoroscopy Time: 42 seconds, 9 mGy COMPLICATIONS: None immediate. PROCEDURE: Informed  written consent was obtained from the patient after a thorough discussion of the procedural risks, benefits and alternatives. All questions were addressed. A timeout was performed prior to the initiation of the procedure. Patient was placed supine on the interventional table. Gallbladder and liver was evaluated with ultrasound. Right abdomen was prepped and draped in sterile fashion. Maximal barrier sterile technique was utilized including caps, mask, sterile gowns, sterile gloves, sterile drape, hand hygiene and skin antiseptic. Skin was anesthetized with 1% lidocaine. Due to the perihepatic ascites, a Safe-T-Centesis catheter was directed in the perihepatic space with ultrasound guidance. 300 mL of cloudy amber colored fluid was removed. Attention was directed to the cholecystostomy tube placement. After the paracentesis was complete, the skin was anesthetized with 1% lidocaine near the gallbladder. 21 gauge needle was directed into the gallbladder fundus with ultrasound guidance. 0.018 wire was advanced into the gallbladder. The tract was dilated with an Accustick set. A J wire was advanced into the gallbladder and the tract was dilated to accommodate a 10.2 [place] multipurpose drain. 90 mL of clay colored purulent looking fluid was removed from the gallbladder. The gallbladder was  decompressed based on ultrasound at the end of the procedure. Catheter was sutured to skin and attached to a gravity bag. Paracentesis catheter was also removed at the end of the procedure. FINDINGS: Initial ultrasound images demonstrate small to moderate amount of perihepatic ascites and fluid around the gallbladder. Therefore, a paracentesis was performed and majority of the perihepatic fluid was removed. 300 mL of cloudy amber colored fluid removed from the perihepatic space. The fluid appearance was concerning for infection. Gallbladder wall was markedly thickened. Multiple echogenic stones. Catheter was successfully advanced into the gallbladder and the gallbladder was decompressed. 90 mL of clay colored purulent looking fluid was removed from the gallbladder. Gallbladder was decompressed at end of the procedure. IMPRESSION: Successful image guided percutaneous cholecystostomy tube placement. In addition, 300 mL perihepatic ascites was removed during the procedure. Appearance of the peritoneal fluid is concerning for infection and underlying gallbladder perforation. Gallbladder fluid and peritoneal fluid was sent for culture. Electronically Signed   By: Markus Daft M.D.   On: 08/30/2018 17:05    Assessment/Plan: Cholecystitis S/p perc chole drain Await cultures. Drain fxn well. IR following.    LOS: 1 day   I spent a total of 15 minutes in face to face in clinical consultation, greater than 50% of which was counseling/coordinating care for perc chole drain  Ascencion Dike PA-C 08/31/2018 9:44 AM

## 2018-08-31 NOTE — Progress Notes (Signed)
Family Medicine Teaching Service Daily Progress Note Intern Pager: [phone]  Patient name: Hunter Carter Medical record number: [id number] Date of birth: Oct 26, 1950 Age: 67 y.o. Gender: male  Primary Care Provider: Nuala Alpha, DO Consultants: surgery, IR Code Status: DNR  Pt Overview and Major Events to Date:  11/16 admitted with sepsis 2/2 cholecystitis, perc cholecystostomy tube placed by IR 11/17 continued abd pain  Assessment and Plan: Hunter Carter is a 67 y.o. male presenting with diffuse abdominal pain, found to be septic with acute cholecystitis. PMH is significant for HTN, CAD s/p CABG in 2004 with pacemaker in place, paroxysmal atrial tachycardia, liver cirrhosis 2/2 chronic alcohol abuse and treated Hep C with previous hepatic encephalopathy, chronic diastolic CHF, and paroxysmal atrial fibrillation.   Sepsis 2/2 acute cholecystitis:  s/p perc tube 11/16, white count stable. Continued hypotension, although normal mentation. Outpatient BPs appear relatively normotensive. Afebrile, blood cultures NGTD. S/p 2U FFP prior to perc tube.  On my exam this morning, he has worsening abd pain despite recent morphine dosing. Abd is more tense than yesterday. Called surgery to eval, got STAT KUB. Nursing reports drain is flushing well.  -serial abdominal exams -Consult IR, discuss percutaneous cholecystostomy, will give FFP prior to for INR 2  -Vitals q4 -reconsult CCM if continued hypotension -Cont IV Vancomycin, Zosyn, and flagyl, plan to narrow coverage as appropriate -Zofran PRN nausea  -Daily CBC, CMP -NS 75cc/hr  Hyperkalemia: K 5.4 this am, likely mixed etiology 2/2 AKI and holding home lasix.  -STAT repeat  Liver cirrhosis 2/2 previous alcohol abuse and treated Hepatitis C: MELD-Na score 27, 27-32% 90-day mortality. Poor synthetic function- INR 2.41. If further electrolyte derangements, we will need to involve GI.  -Hold home lasix in setting of AKI and hypotension   -Hold lactulose in setting of NPO status  -Consideration for adding spironolactone outpatient if stable at that time   SOB with dry cough: Improving. CXR without acute cardiopulmonary disease. Likely related to pleurisy given edematous gallbladder. Removed O2 today on my exam.  -Monitor symptoms -Treatment for gallbladder as above   AKI: Cr 1.47 on admit, from 0.85 on 08/15/2018. Baseline around 0.9. Likely in the setting of poor perfusion secondary to acute sepsis.  -Avoid nephrotoxic medications as possible  -daily BMP  -mIVF 75 ml/hr   Stable, chronic conditions:   Chronic HFpEF: Stable.  Hypo-Euvolemic on exam. No recent echo or cath in records. Follows with Dr. Loletha Grayer.  -Consider echocardiogram in future if medically stable  -Holding home metoprolol and losartan in setting of hypotension and NPO  -Holding home statin as above   Paroxysmal AT and atrial fib with pacemaker in place: Stable.  Follows with Dr. Loletha Grayer outpatient. NSR currently, recent pacemaker interrogation in 07/2018 showing low prevalence (0.7%) of atrial fibrillation. Takes metoprolol daily. Not on anticoagulation, auto anticoagulated due to liver cirrhosis.   -Cardiac monitoring  -Hold home metoprolol as above   CAD s/p CABG in 2004: Stable.  EKG without ischemic changes, troponin <0.03.  -Hold home statin given NPO status, not on secondary prevention with aspirin daily given previous GI bleed and severe liver cirrhosis.  FEN/GI: clears, none PPx: autoanticoagulated, platelets 57  Disposition: stepdown  Subjective:  Patient with labored breathing 2/2 pain depsite recent morphine. Still carries on conversation and asks how I am doing.   Objective: Temp:  [97.6 F (36.4 C)-100.5 F (38.1 C)] 98.5 F (36.9 C) (11/17 0745) Pulse Rate:  [65-79] 78 (11/17 0305) Resp:  [20-36] 22 (11/17 0305) BP: (  77-110)/(42-85) 94/52 (11/17 0305) SpO2:  [92 %-100 %] 92 % (11/17 0305) Physical Exam: General: ill appearing  male lying in bed in acute pain  Cardiovascular: RRR, no murmur Respiratory: forced expirations 2/2 pain, clear lungs Abdomen: distended, tense, diffusely tender. Perc tube with good serosanguinous output.  Extremities: warm, no edema  Laboratory: Recent Labs  Lab 08/30/18 0705 08/31/18 0429  WBC 12.5* 11.4*  HGB 14.4 11.9*  HCT 44.6 35.7*  PLT 61* 57*   Recent Labs  Lab 08/30/18 0705 08/31/18 0429  NA 131* 133*  K 4.3 5.4*  CL 105 105  CO2 18* 23  BUN 8 15  CREATININE 1.47* 1.31*  CALCIUM 7.7* 7.8*  PROT 8.5* 7.2  BILITOT 3.9* 5.1*  ALKPHOS 92 79  ALT 17 15  AST 41 32  GLUCOSE 112* 87     Imaging/Diagnostic Tests: STAT KUB pending  Sela Hilding, MD 08/31/2018, 7:48 AM PGY-3, [facility] Intern pager: [phone], text pages welcome

## 2018-09-01 ENCOUNTER — Inpatient Hospital Stay (HOSPITAL_COMMUNITY): Payer: Medicare Other

## 2018-09-01 DIAGNOSIS — I480 Paroxysmal atrial fibrillation: Secondary | ICD-10-CM

## 2018-09-01 DIAGNOSIS — I441 Atrioventricular block, second degree: Secondary | ICD-10-CM

## 2018-09-01 DIAGNOSIS — Z950 Presence of cardiac pacemaker: Secondary | ICD-10-CM

## 2018-09-01 LAB — BASIC METABOLIC PANEL WITH GFR
Anion gap: 6 (ref 5–15)
BUN: 14 mg/dL (ref 8–23)
CO2: 22 mmol/L (ref 22–32)
Calcium: 7.9 mg/dL — ABNORMAL LOW (ref 8.9–10.3)
Chloride: 102 mmol/L (ref 98–111)
Creatinine, Ser: 1.08 mg/dL (ref 0.61–1.24)
GFR calc Af Amer: >60 mL/min
GFR calc non Af Amer: >60 mL/min
Glucose, Bld: 102 mg/dL — ABNORMAL HIGH (ref 70–99)
Potassium: 4.4 mmol/L (ref 3.5–5.1)
Sodium: 130 mmol/L — ABNORMAL LOW (ref 135–145)

## 2018-09-01 LAB — COMPREHENSIVE METABOLIC PANEL
ALK PHOS: 80 U/L (ref 38–126)
ALT: 16 U/L (ref 0–44)
AST: 32 U/L (ref 15–41)
Albumin: 1.7 g/dL — ABNORMAL LOW (ref 3.5–5.0)
Anion gap: 7 (ref 5–15)
BUN: 14 mg/dL (ref 8–23)
CHLORIDE: 102 mmol/L (ref 98–111)
CO2: 22 mmol/L (ref 22–32)
CREATININE: 1.03 mg/dL (ref 0.61–1.24)
Calcium: 7.9 mg/dL — ABNORMAL LOW (ref 8.9–10.3)
GFR calc Af Amer: >60 mL/min (ref >60)
GFR calc non Af Amer: >60 mL/min (ref >60)
Glucose, Bld: 93 mg/dL (ref 70–99)
Potassium: 4.6 mmol/L (ref 3.5–5.1)
SODIUM: 131 mmol/L — AB (ref 135–145)
Total Bilirubin: 4.5 mg/dL — ABNORMAL HIGH (ref 0.3–1.2)
Total Protein: 8.3 g/dL — ABNORMAL HIGH (ref 6.5–8.1)

## 2018-09-01 LAB — BODY FLUID CULTURE: Special Requests: NORMAL

## 2018-09-01 LAB — CBC
HCT: 40.9 % (ref 39.0–52.0)
HEMOGLOBIN: 12.8 g/dL — AB (ref 13.0–17.0)
MCH: 31.1 pg (ref 26.0–34.0)
MCHC: 31.3 g/dL (ref 30.0–36.0)
MCV: 99.5 fL (ref 80.0–100.0)
PLATELETS: 58 10*3/uL — AB (ref 150–400)
RBC: 4.11 MIL/uL — AB (ref 4.22–5.81)
RDW: 14.1 % (ref 11.5–15.5)
WBC: 11.2 10*3/uL — ABNORMAL HIGH (ref 4.0–10.5)
nRBC: 0 % (ref 0.0–0.2)

## 2018-09-01 LAB — PROTIME-INR
INR: 2.14
Prothrombin Time: 23.6 seconds — ABNORMAL HIGH (ref 11.4–15.2)

## 2018-09-01 LAB — MAGNESIUM: Magnesium: 1.4 mg/dL — ABNORMAL LOW (ref 1.7–2.4)

## 2018-09-01 MED ORDER — POLYETHYLENE GLYCOL 3350 17 G PO PACK
17.0000 g | PACK | Freq: Every day | ORAL | Status: DC
  Administered 2018-09-01 – 2018-09-07 (×4): 17 g via ORAL
  Filled 2018-09-01 (×9): qty 1

## 2018-09-01 MED ORDER — METOPROLOL TARTRATE 5 MG/5ML IV SOLN
5.0000 mg | Freq: Once | INTRAVENOUS | Status: AC
  Administered 2018-09-01: 5 mg via INTRAVENOUS
  Filled 2018-09-01: qty 5

## 2018-09-01 MED ORDER — IPRATROPIUM-ALBUTEROL 0.5-2.5 (3) MG/3ML IN SOLN
3.0000 mL | RESPIRATORY_TRACT | Status: AC
  Administered 2018-09-01: 3 mL via RESPIRATORY_TRACT
  Filled 2018-09-01: qty 3

## 2018-09-01 MED ORDER — IOHEXOL 300 MG/ML  SOLN
100.0000 mL | Freq: Once | INTRAMUSCULAR | Status: AC | PRN
  Administered 2018-09-01: 100 mL via INTRAVENOUS

## 2018-09-01 MED ORDER — SODIUM CHLORIDE 0.9 % IV SOLN
1.0000 g | Freq: Three times a day (TID) | INTRAVENOUS | Status: DC
  Administered 2018-09-01 – 2018-09-03 (×6): 1 g via INTRAVENOUS
  Filled 2018-09-01 (×7): qty 1

## 2018-09-01 MED ORDER — AMIODARONE HCL IN DEXTROSE 360-4.14 MG/200ML-% IV SOLN
30.0000 mg/h | INTRAVENOUS | Status: DC
  Administered 2018-09-01 – 2018-09-03 (×4): 30 mg/h via INTRAVENOUS
  Filled 2018-09-01 (×5): qty 200

## 2018-09-01 MED ORDER — AMIODARONE HCL IN DEXTROSE 360-4.14 MG/200ML-% IV SOLN
60.0000 mg/h | INTRAVENOUS | Status: AC
  Administered 2018-09-01 (×2): 60 mg/h via INTRAVENOUS
  Filled 2018-09-01: qty 200

## 2018-09-01 MED ORDER — FUROSEMIDE 10 MG/ML IJ SOLN
40.0000 mg | Freq: Once | INTRAMUSCULAR | Status: AC
  Administered 2018-09-01: 40 mg via INTRAVENOUS
  Filled 2018-09-01: qty 4

## 2018-09-01 MED ORDER — MORPHINE SULFATE (PF) 2 MG/ML IV SOLN
2.0000 mg | INTRAVENOUS | Status: DC | PRN
  Administered 2018-09-01 – 2018-09-04 (×9): 2 mg via INTRAVENOUS
  Filled 2018-09-01 (×9): qty 1

## 2018-09-01 MED ORDER — AMIODARONE LOAD VIA INFUSION
150.0000 mg | Freq: Once | INTRAVENOUS | Status: AC
  Administered 2018-09-01: 150 mg via INTRAVENOUS
  Filled 2018-09-01: qty 83.34

## 2018-09-01 MED ORDER — MORPHINE SULFATE (PF) 2 MG/ML IV SOLN
1.0000 mg | INTRAVENOUS | Status: AC
  Administered 2018-09-01: 1 mg via INTRAVENOUS
  Filled 2018-09-01: qty 1

## 2018-09-01 MED ORDER — MORPHINE SULFATE (PF) 2 MG/ML IV SOLN: 2.0000 mg | Freq: Once | INTRAVENOUS | Status: DC

## 2018-09-01 MED ORDER — MORPHINE SULFATE (PF) 2 MG/ML IV SOLN
2.0000 mg | Freq: Once | INTRAVENOUS | Status: AC
  Administered 2018-09-01: 2 mg via INTRAVENOUS
  Filled 2018-09-01: qty 1

## 2018-09-01 NOTE — Progress Notes (Signed)
FPTS Interim Progress Note:   S:  Paged by RN for increasing SOB and abdominal pain. Evaluated patient at bedside. He states he has had intermittent increasing abdominal pain since admission, feels his abdomen has become more distended and tender for the past few hours, similar to this morning. Insidious onset. Mainly sore on the R side, with occasional sharp pains. Also endorses shortness of breath and dry coughing with deep inspiration. Has been taking shallow breaths to avoid irritation. Morphine and ice pads having been helping. Lastly, notes central chest pain with coughing and deep breath. Last BM earlier this afternoon, normal color and consistency. Has been eating solid food today. Denies any N/V, melena, hematochezia, or calf tenderness.    O: Blood pressure 100/86, pulse 89, temperature 98.4 F (36.9 C), temperature source Oral, resp. rate (!) 23, height 5\' 6"  (1.676 m), weight 96.6 kg, SpO2 93 %. Gen: alert and oriented, in mild distress due to pain  Cardiac: faint heart sounds, RRR  Lungs: CTA with no increased WOB, able to speak full sentences, on 2L [place] satting well. Slightly tachypnea intermittently due to some shallow breathing 2/2 pain.  Abdomen: Tense distended abdomen, tender to RUQ and RLQ. No rebounding or guarding. Hypoactive BS. Perc tube in place with good serosanguinous drainage.  MSK: reproducible chest pain with palpable to chest intercostal spaces  Ext: warm, dry, mild edema to BLE, no calf tenderness, homan's sign negative b/l    A/P:    Abdominal pain, in setting of acute cholecystitis s/p cholecystostomy tube: Intermittent. KUB done this am due to increasing abdominal pain and distention, no acute abnormalities. Stable abdomen compared to previous exams. BM this afternoon, no N/V, perc tube with appropriate drainage and recently flushed. Last received morphine >1 hr ago.  -Additional Morphine 2 mg once, cont q4 PRN schedule  -Ice and heating pad to abdomen as  requested -Diet back to full liquid diet (from soft)  -Continued management per day team with serial abdominal exams   SOB with coughing: Present since admission. Lungs initially clear on evaluation, noted some mild expiratory wheezing on additional evaluation after some coughing. Appears consistent with pleurisy given edematous gallbladder. CXR clear on admission. Satting well on 2L [place] without documented desaturations. Wells score for PE, low risk.  -Breathing treatment x1, reassess for wheezing  -Treatment for abdominal pain as above   Chest pain: Progressing since admission. Exact pain reproducible on exam with palpation. Appears to be MSK likely related to excessive coughing. Not very bothersome to patient, concerned more for his abdominal pain. Low concern for cardiac etiology, VSS, cardiac RRR, troponin and EKG NSR without acute ischemic changes on admit.  -Continue to monitor, consider lidocaine patch if becomes more bothersome   -CM  Will continue to monitor, continued management per day team.   Patriciaann Clan, DO  Family medicine PGY-1

## 2018-09-01 NOTE — Progress Notes (Signed)
Referring Physician(s): Dr Jonny Ruiz   Supervising Physician: Daryll Brod  Patient Status:  [facility] - In-pt  Chief Complaint:  Perc chole drain placed 11/16  Subjective:  Up on side of bed Feels some better Sore 250 cc daily afeb   Allergies: Patient has no known allergies.  Medications: Prior to Admission medications   Medication Sig Start Date End Date Taking? Authorizing Provider  furosemide (LASIX) 40 MG tablet TAKE 1 TABLET BY MOUTH  DAILY AS NEEDED Patient taking differently: Take 40 mg by mouth daily. Make take an additional 40 mg if needed 07/15/18  Yes Lockamy, Timothy, DO  lactulose (CHRONULAC) 10 GM/15ML solution Take 45 mLs (30 g total) by mouth 2 (two) times daily. Titrate for 3-4 BM's/ day Patient taking differently: Take 30 g by mouth 3 (three) times daily. Titrate for 3-4 BM's/ day 03/22/18  Yes Sheikh, Omair Latif, DO  losartan (COZAAR) 25 MG tablet TAKE 1 TABLET BY MOUTH  DAILY Patient taking differently: Take 25 mg by mouth daily.  04/30/18  Yes Lockamy, Timothy, DO  metoprolol tartrate (LOPRESSOR) 50 MG tablet TAKE 1 TABLET BY MOUTH TWO  TIMES DAILY Patient taking differently: Take 50 mg by mouth daily.  07/15/18  Yes Lockamy, Timothy, DO  omeprazole (PRILOSEC) 20 MG capsule TAKE 1 CAPSULE BY MOUTH  DAILY Patient taking differently: Take 20 mg by mouth daily.  07/15/18  Yes Lockamy, Timothy, DO  PROAIR HFA 108 (90 Base) MCG/ACT inhaler USE 2 PUFFS BY MOUTH EVERY  4 HOURS AS NEEDED FOR  WHEEZING OR SHORTNESS OF  BREATH (OR COUGHING) Patient taking differently: Inhale 1-2 puffs into the lungs every 4 (four) hours as needed for wheezing.  06/20/18  Yes Lockamy, Timothy, DO  simvastatin (ZOCOR) 20 MG tablet TAKE 1 TABLET BY MOUTH  DAILY Patient taking differently: Take 20 mg by mouth daily at 6 PM.  07/15/18  Yes Lockamy, Timothy, DO     Vital Signs: BP 100/86   Pulse 89   Temp 98.4 F (36.9 C) (Oral)   Resp (!) 23   Ht 5\' 6"  (1.676 m)   Wt 212 lb 15.4 oz  (96.6 kg)   SpO2 93%   BMI 34.37 kg/m   Physical Exam  Abdominal: There is tenderness.  Skin: Skin is warm and dry.  Site is clean and dry Tender  To touch OP bloody- purulent 250 cc OP- GNRs   Vitals reviewed.   Imaging: Dg Abd 1 View  Result Date: 08/31/2018 CLINICAL DATA:  Sepsis EXAM: ABDOMEN - 1 VIEW COMPARISON:  08/30/2018 FINDINGS: Scattered large and small bowel gas is noted. Contrast material is noted within the bladder from previous CT examination. Multiple gallstones and cholecystostomy tube are noted in the right upper quadrant. No free air is seen. Degenerative changes of lumbar spine are noted. IMPRESSION: Cholelithiasis and cholecystostomy tube. No acute abnormality noted. Electronically Signed   By: Inez Catalina M.D.   On: 08/31/2018 09:36   Ct Angio Chest Pe W Or Wo Contrast  Result Date: 08/30/2018 CLINICAL DATA:  Central abdominal pain and pressure since last night. History of myocardial infarction and cirrhosis. EXAM: CT ANGIOGRAPHY CHEST CT ABDOMEN AND PELVIS WITH CONTRAST TECHNIQUE: Multidetector CT imaging of the chest was performed using the standard protocol during bolus administration of intravenous contrast. Multiplanar CT image reconstructions and MIPs were obtained to evaluate the vascular anatomy. Multidetector CT imaging of the abdomen and pelvis was performed using the standard protocol during bolus administration of intravenous  contrast. CONTRAST:  175mL ISOVUE-370 IOPAMIDOL (ISOVUE-370) INJECTION 76% COMPARISON:  Abdominopelvic CT 07/20/2018. FINDINGS: CTA CHEST FINDINGS Cardiovascular: The pulmonary arteries are well opacified with contrast to the level of the subsegmental branches. There is no evidence of acute pulmonary embolism. There is diffuse atherosclerosis of the aorta, great vessels and coronary arteries status post median sternotomy and CABG. Left subclavian pacemaker leads extend into the right atrium and right ventricle. The heart is mildly  enlarged. There is no pericardial effusion. Mediastinum/Nodes: There are no enlarged mediastinal, hilar or axillary lymph nodes. There is a small hiatal hernia. Distal esophageal varices are again noted. The trachea and thyroid gland appear unremarkable. Lungs/Pleura: Trace bilateral pleural effusions. There is no pneumothorax. Mild dependent atelectasis at both lung bases. No confluent airspace opacity or suspicious pulmonary nodule. Musculoskeletal/Chest wall: There is no chest wall mass or suspicious osseous finding. Review of the MIP images confirms the above findings. CT ABDOMEN and PELVIS FINDINGS Hepatobiliary: There are diffuse morphologic changes of cirrhosis. No focal hepatic lesion or abnormal enhancement identified. There is a large recanalized left paraumbilical vein. The portal vein is patent. There are multiple calcified gallstones. There is new diffuse gallbladder wall thickening with pericholecystic fluid inferiorly, highly suspicious for acute cholecystitis. No significant biliary dilatation demonstrated. Pancreas: Unremarkable. No pancreatic ductal dilatation or surrounding inflammatory changes. Spleen: Normal in size without focal abnormality. Adrenals/Urinary Tract: Both adrenal glands appear normal. There are tiny low-density renal lesions which are likely cysts. No evidence of urinary tract calculus or hydronephrosis. The bladder appears normal. Stomach/Bowel: The stomach and small bowel demonstrate no significant findings. There is mild wall thickening throughout the proximal colon, likely related to the patient's underlying liver disease. No focal bowel wall thickening, significant distention or surrounding inflammation identified. Mild sigmoid diverticulosis. Vascular/Lymphatic: Mildly prominent lymph nodes in the porta hepatis are stable. There is moderate diffuse aortic and branch vessel atherosclerosis. As above, there is a recanalized paraumbilical vein with multiple large venous  collaterals in the anterior abdominal wall and distal esophageal varices. No acute vascular findings are seen. Reproductive: There stable asymmetric calcifications within the left prostate lobe. The seminal vesicles appear normal. Other: There is a small amount of ascites around the liver and in the pelvis. There is mild diffuse mesenteric edema. No focal extraluminal fluid collections are seen. Musculoskeletal: No acute or significant osseous findings. Lower lumbar spondylosis, similar to previous examination. IMPRESSION: 1. New extensive gallbladder wall thickening and pericholecystic fluid highly suspicious for acute cholecystitis. There are multiple underlying calcified gallstones. No significant biliary dilatation. 2. Cirrhosis and portal hypertension with multiple varices, similar to previous study. 3. Ascites and diffuse mesenteric edema. 4. No acute vascular findings are identified. There is no evidence of acute pulmonary embolism. Aortic Atherosclerosis (ICD10-I70.0). 5. Preliminary results have been discussed with Dr. Ellender Hose. Electronically Signed   By: Richardean Sale M.D.   On: 08/30/2018 10:53   Ct Abdomen Pelvis W Contrast  Result Date: 08/30/2018 CLINICAL DATA:  Central abdominal pain and pressure since last night. History of myocardial infarction and cirrhosis. EXAM: CT ANGIOGRAPHY CHEST CT ABDOMEN AND PELVIS WITH CONTRAST TECHNIQUE: Multidetector CT imaging of the chest was performed using the standard protocol during bolus administration of intravenous contrast. Multiplanar CT image reconstructions and MIPs were obtained to evaluate the vascular anatomy. Multidetector CT imaging of the abdomen and pelvis was performed using the standard protocol during bolus administration of intravenous contrast. CONTRAST:  125mL ISOVUE-370 IOPAMIDOL (ISOVUE-370) INJECTION 76% COMPARISON:  Abdominopelvic CT 07/20/2018. FINDINGS: CTA  CHEST FINDINGS Cardiovascular: The pulmonary arteries are well opacified with  contrast to the level of the subsegmental branches. There is no evidence of acute pulmonary embolism. There is diffuse atherosclerosis of the aorta, great vessels and coronary arteries status post median sternotomy and CABG. Left subclavian pacemaker leads extend into the right atrium and right ventricle. The heart is mildly enlarged. There is no pericardial effusion. Mediastinum/Nodes: There are no enlarged mediastinal, hilar or axillary lymph nodes. There is a small hiatal hernia. Distal esophageal varices are again noted. The trachea and thyroid gland appear unremarkable. Lungs/Pleura: Trace bilateral pleural effusions. There is no pneumothorax. Mild dependent atelectasis at both lung bases. No confluent airspace opacity or suspicious pulmonary nodule. Musculoskeletal/Chest wall: There is no chest wall mass or suspicious osseous finding. Review of the MIP images confirms the above findings. CT ABDOMEN and PELVIS FINDINGS Hepatobiliary: There are diffuse morphologic changes of cirrhosis. No focal hepatic lesion or abnormal enhancement identified. There is a large recanalized left paraumbilical vein. The portal vein is patent. There are multiple calcified gallstones. There is new diffuse gallbladder wall thickening with pericholecystic fluid inferiorly, highly suspicious for acute cholecystitis. No significant biliary dilatation demonstrated. Pancreas: Unremarkable. No pancreatic ductal dilatation or surrounding inflammatory changes. Spleen: Normal in size without focal abnormality. Adrenals/Urinary Tract: Both adrenal glands appear normal. There are tiny low-density renal lesions which are likely cysts. No evidence of urinary tract calculus or hydronephrosis. The bladder appears normal. Stomach/Bowel: The stomach and small bowel demonstrate no significant findings. There is mild wall thickening throughout the proximal colon, likely related to the patient's underlying liver disease. No focal bowel wall thickening,  significant distention or surrounding inflammation identified. Mild sigmoid diverticulosis. Vascular/Lymphatic: Mildly prominent lymph nodes in the porta hepatis are stable. There is moderate diffuse aortic and branch vessel atherosclerosis. As above, there is a recanalized paraumbilical vein with multiple large venous collaterals in the anterior abdominal wall and distal esophageal varices. No acute vascular findings are seen. Reproductive: There stable asymmetric calcifications within the left prostate lobe. The seminal vesicles appear normal. Other: There is a small amount of ascites around the liver and in the pelvis. There is mild diffuse mesenteric edema. No focal extraluminal fluid collections are seen. Musculoskeletal: No acute or significant osseous findings. Lower lumbar spondylosis, similar to previous examination. IMPRESSION: 1. New extensive gallbladder wall thickening and pericholecystic fluid highly suspicious for acute cholecystitis. There are multiple underlying calcified gallstones. No significant biliary dilatation. 2. Cirrhosis and portal hypertension with multiple varices, similar to previous study. 3. Ascites and diffuse mesenteric edema. 4. No acute vascular findings are identified. There is no evidence of acute pulmonary embolism. Aortic Atherosclerosis (ICD10-I70.0). 5. Preliminary results have been discussed with Dr. Ellender Hose. Electronically Signed   By: Richardean Sale M.D.   On: 08/30/2018 10:53   Ir Perc Cholecystostomy  Result Date: 08/30/2018 INDICATION: 67 year old with acute cholecystitis, sepsis and end-stage liver disease. Patient is not a surgical candidate due to his liver disease. Plan for percutaneous cholecystostomy tube placement. Patient was given FFP prior to the procedure. EXAM: IMAGE GUIDED PERCUTANEOUS CHOLECYSTOSTOMY TUBE PLACEMENT ULTRASOUND-GUIDED PARACENTESIS MEDICATIONS: Patient received IV antibiotics in the emergency department prior to the procedure.  ANESTHESIA/SEDATION: Moderate (conscious) sedation was employed during this procedure. A total of Versed 1.5 mg and Fentanyl 75 mcg was administered intravenously. Moderate Sedation Time: 13 minutes. The patient's level of consciousness and vital signs were monitored continuously by radiology nursing throughout the procedure under my direct supervision. FLUOROSCOPY TIME:  Fluoroscopy Time: 44  seconds, 9 mGy COMPLICATIONS: None immediate. PROCEDURE: Informed written consent was obtained from the patient after a thorough discussion of the procedural risks, benefits and alternatives. All questions were addressed. A timeout was performed prior to the initiation of the procedure. Patient was placed supine on the interventional table. Gallbladder and liver was evaluated with ultrasound. Right abdomen was prepped and draped in sterile fashion. Maximal barrier sterile technique was utilized including caps, mask, sterile gowns, sterile gloves, sterile drape, hand hygiene and skin antiseptic. Skin was anesthetized with 1% lidocaine. Due to the perihepatic ascites, a Safe-T-Centesis catheter was directed in the perihepatic space with ultrasound guidance. 300 mL of cloudy amber colored fluid was removed. Attention was directed to the cholecystostomy tube placement. After the paracentesis was complete, the skin was anesthetized with 1% lidocaine near the gallbladder. 21 gauge needle was directed into the gallbladder fundus with ultrasound guidance. 0.018 wire was advanced into the gallbladder. The tract was dilated with an Accustick set. A J wire was advanced into the gallbladder and the tract was dilated to accommodate a 10.2 [place] multipurpose drain. 90 mL of clay colored purulent looking fluid was removed from the gallbladder. The gallbladder was decompressed based on ultrasound at the end of the procedure. Catheter was sutured to skin and attached to a gravity bag. Paracentesis catheter was also removed at the end of the  procedure. FINDINGS: Initial ultrasound images demonstrate small to moderate amount of perihepatic ascites and fluid around the gallbladder. Therefore, a paracentesis was performed and majority of the perihepatic fluid was removed. 300 mL of cloudy amber colored fluid removed from the perihepatic space. The fluid appearance was concerning for infection. Gallbladder wall was markedly thickened. Multiple echogenic stones. Catheter was successfully advanced into the gallbladder and the gallbladder was decompressed. 90 mL of clay colored purulent looking fluid was removed from the gallbladder. Gallbladder was decompressed at end of the procedure. IMPRESSION: Successful image guided percutaneous cholecystostomy tube placement. In addition, 300 mL perihepatic ascites was removed during the procedure. Appearance of the peritoneal fluid is concerning for infection and underlying gallbladder perforation. Gallbladder fluid and peritoneal fluid was sent for culture. Electronically Signed   By: Markus Daft M.D.   On: 08/30/2018 17:05   Dg Chest Portable 1 View  Result Date: 08/30/2018 CLINICAL DATA:  Abdominal pain with nausea, vomiting and cough for 3 weeks. History congestive heart failure and pacemaker. EXAM: PORTABLE CHEST 1 VIEW COMPARISON:  03/20/2018 and 09/07/2017 radiographs. FINDINGS: 0732 hour. The left subclavian pacemaker leads appear unchanged within the right atrium and right ventricle. The heart size is stable status post median sternotomy and CABG. The lungs remain clear. There is no pleural effusion or pneumothorax. The bones appear unchanged. Telemetry leads overlie the chest. IMPRESSION: Stable chest post CABG.  No active cardiopulmonary process. Electronically Signed   By: Richardean Sale M.D.   On: 08/30/2018 08:07   Dg Abd Portable 1v  Result Date: 09/01/2018 CLINICAL DATA:  Three-week history of lower abdominal pain. The patient reports the abdomen is distended and firm on the lower left. No  bowel habit changes or nausea or vomiting. History of gastric ulcer disease with bleeding, percutaneous cholecystostomy on August 30, 2018 and paracentesis on the same day. EXAM: PORTABLE ABDOMEN - 1 VIEW COMPARISON:  Abdominal radiographs of August 31, 2018. FINDINGS: The bowel gas pattern is within the limits of normal. Multiple gallstones are noted in the right upper quadrant with a cholecystostomy tube present and stable. No bony abnormalities are observed.  IMPRESSION: Fairly normal appearing bowel gas pattern. Known gallstones and cholecystostomy tube. No significant change since the previous study. Electronically Signed   By: David  Martinique M.D.   On: 09/01/2018 09:10   Ir Paracentesis  Result Date: 08/31/2018 INDICATION: 67 year old with acute cholecystitis, sepsis and end-stage liver disease. Patient is not a surgical candidate due to his liver disease. Plan for percutaneous cholecystostomy tube placement. Patient was given FFP prior to the procedure. EXAM: IMAGE GUIDED PERCUTANEOUS CHOLECYSTOSTOMY TUBE PLACEMENT ULTRASOUND-GUIDED PARACENTESIS MEDICATIONS: Patient received IV antibiotics in the emergency department prior to the procedure. ANESTHESIA/SEDATION: Moderate (conscious) sedation was employed during this procedure. A total of Versed 1.5 mg and Fentanyl 75 mcg was administered intravenously. Moderate Sedation Time: 13 minutes. The patient's level of consciousness and vital signs were monitored continuously by radiology nursing throughout the procedure under my direct supervision. FLUOROSCOPY TIME:  Fluoroscopy Time: 42 seconds, 9 mGy COMPLICATIONS: None immediate. PROCEDURE: Informed written consent was obtained from the patient after a thorough discussion of the procedural risks, benefits and alternatives. All questions were addressed. A timeout was performed prior to the initiation of the procedure. Patient was placed supine on the interventional table. Gallbladder and liver was evaluated  with ultrasound. Right abdomen was prepped and draped in sterile fashion. Maximal barrier sterile technique was utilized including caps, mask, sterile gowns, sterile gloves, sterile drape, hand hygiene and skin antiseptic. Skin was anesthetized with 1% lidocaine. Due to the perihepatic ascites, a Safe-T-Centesis catheter was directed in the perihepatic space with ultrasound guidance. 300 mL of cloudy amber colored fluid was removed. Attention was directed to the cholecystostomy tube placement. After the paracentesis was complete, the skin was anesthetized with 1% lidocaine near the gallbladder. 21 gauge needle was directed into the gallbladder fundus with ultrasound guidance. 0.018 wire was advanced into the gallbladder. The tract was dilated with an Accustick set. A J wire was advanced into the gallbladder and the tract was dilated to accommodate a 10.2 [place] multipurpose drain. 90 mL of clay colored purulent looking fluid was removed from the gallbladder. The gallbladder was decompressed based on ultrasound at the end of the procedure. Catheter was sutured to skin and attached to a gravity bag. Paracentesis catheter was also removed at the end of the procedure. FINDINGS: Initial ultrasound images demonstrate small to moderate amount of perihepatic ascites and fluid around the gallbladder. Therefore, a paracentesis was performed and majority of the perihepatic fluid was removed. 300 mL of cloudy amber colored fluid removed from the perihepatic space. The fluid appearance was concerning for infection. Gallbladder wall was markedly thickened. Multiple echogenic stones. Catheter was successfully advanced into the gallbladder and the gallbladder was decompressed. 90 mL of clay colored purulent looking fluid was removed from the gallbladder. Gallbladder was decompressed at end of the procedure. IMPRESSION: Successful image guided percutaneous cholecystostomy tube placement. In addition, 300 mL perihepatic ascites was  removed during the procedure. Appearance of the peritoneal fluid is concerning for infection and underlying gallbladder perforation. Gallbladder fluid and peritoneal fluid was sent for culture. Electronically Signed   By: Markus Daft M.D.   On: 08/30/2018 17:05    Labs:  CBC: Recent Labs    08/15/18 1112 08/30/18 0705 08/31/18 0429 09/01/18 0602  WBC 4.7 12.5* 11.4* 11.2*  HGB 13.0 14.4 11.9* 12.8*  HCT 35.2* 44.6 35.7* 40.9  PLT 57* 61* 57* 58*    COAGS: Recent Labs    07/22/18 0447 08/30/18 0705 08/31/18 0429 09/01/18 0602  INR 2.17 2.08 2.41 2.14  BMP: Recent Labs    08/31/18 0811 08/31/18 1141 09/01/18 0602 09/01/18 0928  NA 131* 129* 131* 130*  K 5.8* 4.5 4.6 4.4  CL 104 102 102 102  CO2 22 22 22 22   GLUCOSE 103* 146* 93 102*  BUN 17 17 14 14   CALCIUM 7.8* 7.8* 7.9* 7.9*  CREATININE 1.23 1.26* 1.03 1.08  GFRNONAA 60* 58* >60 >60  GFRAA >60 >60 >60 >60    LIVER FUNCTION TESTS: Recent Labs    08/30/18 0705 08/31/18 0429 08/31/18 1141 09/01/18 0602  BILITOT 3.9* 5.1* 4.7* 4.5*  AST 41 32 33 32  ALT 17 15 16 16   ALKPHOS 92 79 71 80  PROT 8.5* 7.2 7.3 8.3*  ALBUMIN 1.7* 1.6* 1.5* 1.7*    Assessment and Plan:  Perc chole drain placed 11/16 Will follow Will need OP IR Clinic follow up 8 weeks Need flush daily at home Record OP He will hear form clinic for time and date  Electronically Signed: Teague Goynes A, PA-C 09/01/2018, 12:59 PM   I spent a total of 15 Minutes at the the patient's bedside AND on the patient's hospital floor or unit, greater than 50% of which was counseling/coordinating care for perc chole drain

## 2018-09-01 NOTE — Progress Notes (Signed)
FPTS Interim Progress Note:   S: Evaluated patient at bedside for evening rounds. Continues to endorse abdominal pain, remaining the same over the course of today. Improved by sitting up in bed or in the chair. Tolerating liquid diet well, no N/V. No BM since yesterday afternoon.  Taking shallow breaths, continues to have irritation with deep inhalation.   O: Blood pressure 123/84, pulse 78, temperature 98.6 F (37 C), temperature source Oral, resp. rate 20, height 5\' 6"  (1.676 m), weight 96.6 kg, SpO2 96 %.  Gen: Mild distress, noticeably in pain. Alert.  Cardiac: RRR, no m/g/r  Lungs: Clear anteriorly, inspiratory bibasilar crackles. Sentences spaced due to shallow breathing and tachypnea. Satting 86-88% without O2, placed 2L [place] during exam, then 95%  Abdomen: Tense, distended, TTP in all quadrants with minimal palpation. Hypoactive BS. Perc tube with serosanguinous drainage.  Ext: Warm, dry, 1+ (2+ in some places) pitting edema to BLE to mid shin. Palpable distal pulses   A/P:   SOB: Likely multifactorial with components of pleurisy in additional to fluid overload vs atelectasis with shallow breathing. Concern for hypervolemia given new onset crackles noted in bilateral lower lobes with oxygen desaturations and increasing LE swelling since yesterday's evaluation. Stopped home lasix on admission due to hypotension and AKI. Cr now 1.08 (1.33 on admit) and BP stable around 115-726'O systolic, consistent MAP >03. Fluids already KVO'd.  -One time IV lasix 40mg  tonight, reevaluate  -CXR for the am  -Continued treatment for cholecystitis- paracentesis tomorrow am  -Morphine q2 PRN for abdominal pain   Will continue to monitor overnight, appreciate continued care by day team.   Patriciaann Clan, [place] PGY-1

## 2018-09-01 NOTE — Progress Notes (Addendum)
[facility] Progress Note     Subjective: CC-  Up in chair. Patient states that he feels better than he did on date of admission, but still has some abdominal discomfort. Pain is diffuse. States that he feels more bloated. Denies any n/v. He is tolerating liquids. No flatus or BM. WBC stable at 11.2, VSS.  Objective: Vital signs in last 24 hours: Temp:  [98.3 F (36.8 C)-98.5 F (36.9 C)] 98.4 F (36.9 C) (11/18 0015) Pulse Rate:  [81-90] 89 (11/18 0450) Resp:  [18-27] 23 (11/18 0450) BP: (100-127)/(63-86) 100/86 (11/18 0450) SpO2:  [93 %-96 %] 93 % (11/18 0450) Last BM Date: 08/30/18  Intake/Output from previous day: 11/17 0701 - 11/18 0700 In: 941.1 [P.O.:220; I.V.:220; IV Piggyback:496.1] Out: 800 [Urine:550; Drains:250] Intake/Output this shift: No intake/output data recorded.  PE: Gen:  Alert, NAD, pleasant HEENT: EOM's intact, pupils equal and round Pulm:  effort normal Abd: Soft, distended, mild diffuse TTP, few BS heard, perc chole tube with SS drainage in pouch Skin: no rashes noted, warm and dry  Lab Results:  Recent Labs    08/31/18 0429 09/01/18 0602  WBC 11.4* 11.2*  HGB 11.9* 12.8*  HCT 35.7* 40.9  PLT 57* 58*   BMET Recent Labs    08/31/18 1141 09/01/18 0602  NA 129* 131*  K 4.5 4.6  CL 102 102  CO2 22 22  GLUCOSE 146* 93  BUN 17 14  CREATININE 1.26* 1.03  CALCIUM 7.8* 7.9*   PT/INR Recent Labs    08/31/18 0429 09/01/18 0602  LABPROT 25.9* 23.6*  INR 2.41 2.14   CMP     Component Value Date/Time   NA 131 (L) 09/01/2018 0602   NA 137 08/15/2018 1112   K 4.6 09/01/2018 0602   CL 102 09/01/2018 0602   CO2 22 09/01/2018 0602   GLUCOSE 93 09/01/2018 0602   BUN 14 09/01/2018 0602   BUN 8 08/15/2018 1112   CREATININE 1.03 09/01/2018 0602   CREATININE 0.78 08/25/2013 0803   CALCIUM 7.9 (L) 09/01/2018 0602   PROT 8.3 (H) 09/01/2018 0602   PROT 7.1 08/15/2018 1112   ALBUMIN 1.7 (L) 09/01/2018 0602   ALBUMIN 2.3  (L) 08/15/2018 1112   AST 32 09/01/2018 0602   ALT 16 09/01/2018 0602   ALKPHOS 80 09/01/2018 0602   BILITOT 4.5 (H) 09/01/2018 0602   BILITOT 2.1 (H) 08/15/2018 1112   GFRNONAA >60 09/01/2018 0602   GFRAA >60 09/01/2018 0602   Lipase     Component Value Date/Time   LIPASE 28 08/30/2018 0705       Studies/Results: Dg Abd 1 View  Result Date: 08/31/2018 CLINICAL DATA:  Sepsis EXAM: ABDOMEN - 1 VIEW COMPARISON:  08/30/2018 FINDINGS: Scattered large and small bowel gas is noted. Contrast material is noted within the bladder from previous CT examination. Multiple gallstones and cholecystostomy tube are noted in the right upper quadrant. No free air is seen. Degenerative changes of lumbar spine are noted. IMPRESSION: Cholelithiasis and cholecystostomy tube. No acute abnormality noted. Electronically Signed   By: Inez Catalina M.D.   On: 08/31/2018 09:36   Ct Angio Chest Pe W Or Wo Contrast  Result Date: 08/30/2018 CLINICAL DATA:  Central abdominal pain and pressure since last night. History of myocardial infarction and cirrhosis. EXAM: CT ANGIOGRAPHY CHEST CT ABDOMEN AND PELVIS WITH CONTRAST TECHNIQUE: Multidetector CT imaging of the chest was performed using the standard protocol during bolus administration of intravenous contrast. Multiplanar CT image reconstructions and  MIPs were obtained to evaluate the vascular anatomy. Multidetector CT imaging of the abdomen and pelvis was performed using the standard protocol during bolus administration of intravenous contrast. CONTRAST:  175mL ISOVUE-370 IOPAMIDOL (ISOVUE-370) INJECTION 76% COMPARISON:  Abdominopelvic CT 07/20/2018. FINDINGS: CTA CHEST FINDINGS Cardiovascular: The pulmonary arteries are well opacified with contrast to the level of the subsegmental branches. There is no evidence of acute pulmonary embolism. There is diffuse atherosclerosis of the aorta, great vessels and coronary arteries status post median sternotomy and CABG. Left  subclavian pacemaker leads extend into the right atrium and right ventricle. The heart is mildly enlarged. There is no pericardial effusion. Mediastinum/Nodes: There are no enlarged mediastinal, hilar or axillary lymph nodes. There is a small hiatal hernia. Distal esophageal varices are again noted. The trachea and thyroid gland appear unremarkable. Lungs/Pleura: Trace bilateral pleural effusions. There is no pneumothorax. Mild dependent atelectasis at both lung bases. No confluent airspace opacity or suspicious pulmonary nodule. Musculoskeletal/Chest wall: There is no chest wall mass or suspicious osseous finding. Review of the MIP images confirms the above findings. CT ABDOMEN and PELVIS FINDINGS Hepatobiliary: There are diffuse morphologic changes of cirrhosis. No focal hepatic lesion or abnormal enhancement identified. There is a large recanalized left paraumbilical vein. The portal vein is patent. There are multiple calcified gallstones. There is new diffuse gallbladder wall thickening with pericholecystic fluid inferiorly, highly suspicious for acute cholecystitis. No significant biliary dilatation demonstrated. Pancreas: Unremarkable. No pancreatic ductal dilatation or surrounding inflammatory changes. Spleen: Normal in size without focal abnormality. Adrenals/Urinary Tract: Both adrenal glands appear normal. There are tiny low-density renal lesions which are likely cysts. No evidence of urinary tract calculus or hydronephrosis. The bladder appears normal. Stomach/Bowel: The stomach and small bowel demonstrate no significant findings. There is mild wall thickening throughout the proximal colon, likely related to the patient's underlying liver disease. No focal bowel wall thickening, significant distention or surrounding inflammation identified. Mild sigmoid diverticulosis. Vascular/Lymphatic: Mildly prominent lymph nodes in the porta hepatis are stable. There is moderate diffuse aortic and branch vessel  atherosclerosis. As above, there is a recanalized paraumbilical vein with multiple large venous collaterals in the anterior abdominal wall and distal esophageal varices. No acute vascular findings are seen. Reproductive: There stable asymmetric calcifications within the left prostate lobe. The seminal vesicles appear normal. Other: There is a small amount of ascites around the liver and in the pelvis. There is mild diffuse mesenteric edema. No focal extraluminal fluid collections are seen. Musculoskeletal: No acute or significant osseous findings. Lower lumbar spondylosis, similar to previous examination. IMPRESSION: 1. New extensive gallbladder wall thickening and pericholecystic fluid highly suspicious for acute cholecystitis. There are multiple underlying calcified gallstones. No significant biliary dilatation. 2. Cirrhosis and portal hypertension with multiple varices, similar to previous study. 3. Ascites and diffuse mesenteric edema. 4. No acute vascular findings are identified. There is no evidence of acute pulmonary embolism. Aortic Atherosclerosis (ICD10-I70.0). 5. Preliminary results have been discussed with Dr. Ellender Hose. Electronically Signed   By: Richardean Sale M.D.   On: 08/30/2018 10:53   Ct Abdomen Pelvis W Contrast  Result Date: 08/30/2018 CLINICAL DATA:  Central abdominal pain and pressure since last night. History of myocardial infarction and cirrhosis. EXAM: CT ANGIOGRAPHY CHEST CT ABDOMEN AND PELVIS WITH CONTRAST TECHNIQUE: Multidetector CT imaging of the chest was performed using the standard protocol during bolus administration of intravenous contrast. Multiplanar CT image reconstructions and MIPs were obtained to evaluate the vascular anatomy. Multidetector CT imaging of the abdomen and pelvis  was performed using the standard protocol during bolus administration of intravenous contrast. CONTRAST:  154mL ISOVUE-370 IOPAMIDOL (ISOVUE-370) INJECTION 76% COMPARISON:  Abdominopelvic CT  07/20/2018. FINDINGS: CTA CHEST FINDINGS Cardiovascular: The pulmonary arteries are well opacified with contrast to the level of the subsegmental branches. There is no evidence of acute pulmonary embolism. There is diffuse atherosclerosis of the aorta, great vessels and coronary arteries status post median sternotomy and CABG. Left subclavian pacemaker leads extend into the right atrium and right ventricle. The heart is mildly enlarged. There is no pericardial effusion. Mediastinum/Nodes: There are no enlarged mediastinal, hilar or axillary lymph nodes. There is a small hiatal hernia. Distal esophageal varices are again noted. The trachea and thyroid gland appear unremarkable. Lungs/Pleura: Trace bilateral pleural effusions. There is no pneumothorax. Mild dependent atelectasis at both lung bases. No confluent airspace opacity or suspicious pulmonary nodule. Musculoskeletal/Chest wall: There is no chest wall mass or suspicious osseous finding. Review of the MIP images confirms the above findings. CT ABDOMEN and PELVIS FINDINGS Hepatobiliary: There are diffuse morphologic changes of cirrhosis. No focal hepatic lesion or abnormal enhancement identified. There is a large recanalized left paraumbilical vein. The portal vein is patent. There are multiple calcified gallstones. There is new diffuse gallbladder wall thickening with pericholecystic fluid inferiorly, highly suspicious for acute cholecystitis. No significant biliary dilatation demonstrated. Pancreas: Unremarkable. No pancreatic ductal dilatation or surrounding inflammatory changes. Spleen: Normal in size without focal abnormality. Adrenals/Urinary Tract: Both adrenal glands appear normal. There are tiny low-density renal lesions which are likely cysts. No evidence of urinary tract calculus or hydronephrosis. The bladder appears normal. Stomach/Bowel: The stomach and small bowel demonstrate no significant findings. There is mild wall thickening throughout the  proximal colon, likely related to the patient's underlying liver disease. No focal bowel wall thickening, significant distention or surrounding inflammation identified. Mild sigmoid diverticulosis. Vascular/Lymphatic: Mildly prominent lymph nodes in the porta hepatis are stable. There is moderate diffuse aortic and branch vessel atherosclerosis. As above, there is a recanalized paraumbilical vein with multiple large venous collaterals in the anterior abdominal wall and distal esophageal varices. No acute vascular findings are seen. Reproductive: There stable asymmetric calcifications within the left prostate lobe. The seminal vesicles appear normal. Other: There is a small amount of ascites around the liver and in the pelvis. There is mild diffuse mesenteric edema. No focal extraluminal fluid collections are seen. Musculoskeletal: No acute or significant osseous findings. Lower lumbar spondylosis, similar to previous examination. IMPRESSION: 1. New extensive gallbladder wall thickening and pericholecystic fluid highly suspicious for acute cholecystitis. There are multiple underlying calcified gallstones. No significant biliary dilatation. 2. Cirrhosis and portal hypertension with multiple varices, similar to previous study. 3. Ascites and diffuse mesenteric edema. 4. No acute vascular findings are identified. There is no evidence of acute pulmonary embolism. Aortic Atherosclerosis (ICD10-I70.0). 5. Preliminary results have been discussed with Dr. Ellender Hose. Electronically Signed   By: Richardean Sale M.D.   On: 08/30/2018 10:53   Ir Perc Cholecystostomy  Result Date: 08/30/2018 INDICATION: 67 year old with acute cholecystitis, sepsis and end-stage liver disease. Patient is not a surgical candidate due to his liver disease. Plan for percutaneous cholecystostomy tube placement. Patient was given FFP prior to the procedure. EXAM: IMAGE GUIDED PERCUTANEOUS CHOLECYSTOSTOMY TUBE PLACEMENT ULTRASOUND-GUIDED PARACENTESIS  MEDICATIONS: Patient received IV antibiotics in the emergency department prior to the procedure. ANESTHESIA/SEDATION: Moderate (conscious) sedation was employed during this procedure. A total of Versed 1.5 mg and Fentanyl 75 mcg was administered intravenously. Moderate Sedation Time: 13 minutes.  The patient's level of consciousness and vital signs were monitored continuously by radiology nursing throughout the procedure under my direct supervision. FLUOROSCOPY TIME:  Fluoroscopy Time: 42 seconds, 9 mGy COMPLICATIONS: None immediate. PROCEDURE: Informed written consent was obtained from the patient after a thorough discussion of the procedural risks, benefits and alternatives. All questions were addressed. A timeout was performed prior to the initiation of the procedure. Patient was placed supine on the interventional table. Gallbladder and liver was evaluated with ultrasound. Right abdomen was prepped and draped in sterile fashion. Maximal barrier sterile technique was utilized including caps, mask, sterile gowns, sterile gloves, sterile drape, hand hygiene and skin antiseptic. Skin was anesthetized with 1% lidocaine. Due to the perihepatic ascites, a Safe-T-Centesis catheter was directed in the perihepatic space with ultrasound guidance. 300 mL of cloudy amber colored fluid was removed. Attention was directed to the cholecystostomy tube placement. After the paracentesis was complete, the skin was anesthetized with 1% lidocaine near the gallbladder. 21 gauge needle was directed into the gallbladder fundus with ultrasound guidance. 0.018 wire was advanced into the gallbladder. The tract was dilated with an Accustick set. A J wire was advanced into the gallbladder and the tract was dilated to accommodate a 10.2 [place] multipurpose drain. 90 mL of clay colored purulent looking fluid was removed from the gallbladder. The gallbladder was decompressed based on ultrasound at the end of the procedure. Catheter was sutured  to skin and attached to a gravity bag. Paracentesis catheter was also removed at the end of the procedure. FINDINGS: Initial ultrasound images demonstrate small to moderate amount of perihepatic ascites and fluid around the gallbladder. Therefore, a paracentesis was performed and majority of the perihepatic fluid was removed. 300 mL of cloudy amber colored fluid removed from the perihepatic space. The fluid appearance was concerning for infection. Gallbladder wall was markedly thickened. Multiple echogenic stones. Catheter was successfully advanced into the gallbladder and the gallbladder was decompressed. 90 mL of clay colored purulent looking fluid was removed from the gallbladder. Gallbladder was decompressed at end of the procedure. IMPRESSION: Successful image guided percutaneous cholecystostomy tube placement. In addition, 300 mL perihepatic ascites was removed during the procedure. Appearance of the peritoneal fluid is concerning for infection and underlying gallbladder perforation. Gallbladder fluid and peritoneal fluid was sent for culture. Electronically Signed   By: Markus Daft M.D.   On: 08/30/2018 17:05   Ir Paracentesis  Result Date: 08/31/2018 INDICATION: 67 year old with acute cholecystitis, sepsis and end-stage liver disease. Patient is not a surgical candidate due to his liver disease. Plan for percutaneous cholecystostomy tube placement. Patient was given FFP prior to the procedure. EXAM: IMAGE GUIDED PERCUTANEOUS CHOLECYSTOSTOMY TUBE PLACEMENT ULTRASOUND-GUIDED PARACENTESIS MEDICATIONS: Patient received IV antibiotics in the emergency department prior to the procedure. ANESTHESIA/SEDATION: Moderate (conscious) sedation was employed during this procedure. A total of Versed 1.5 mg and Fentanyl 75 mcg was administered intravenously. Moderate Sedation Time: 13 minutes. The patient's level of consciousness and vital signs were monitored continuously by radiology nursing throughout the procedure  under my direct supervision. FLUOROSCOPY TIME:  Fluoroscopy Time: 42 seconds, 9 mGy COMPLICATIONS: None immediate. PROCEDURE: Informed written consent was obtained from the patient after a thorough discussion of the procedural risks, benefits and alternatives. All questions were addressed. A timeout was performed prior to the initiation of the procedure. Patient was placed supine on the interventional table. Gallbladder and liver was evaluated with ultrasound. Right abdomen was prepped and draped in sterile fashion. Maximal barrier sterile technique was utilized including  caps, mask, sterile gowns, sterile gloves, sterile drape, hand hygiene and skin antiseptic. Skin was anesthetized with 1% lidocaine. Due to the perihepatic ascites, a Safe-T-Centesis catheter was directed in the perihepatic space with ultrasound guidance. 300 mL of cloudy amber colored fluid was removed. Attention was directed to the cholecystostomy tube placement. After the paracentesis was complete, the skin was anesthetized with 1% lidocaine near the gallbladder. 21 gauge needle was directed into the gallbladder fundus with ultrasound guidance. 0.018 wire was advanced into the gallbladder. The tract was dilated with an Accustick set. A J wire was advanced into the gallbladder and the tract was dilated to accommodate a 10.2 [place] multipurpose drain. 90 mL of clay colored purulent looking fluid was removed from the gallbladder. The gallbladder was decompressed based on ultrasound at the end of the procedure. Catheter was sutured to skin and attached to a gravity bag. Paracentesis catheter was also removed at the end of the procedure. FINDINGS: Initial ultrasound images demonstrate small to moderate amount of perihepatic ascites and fluid around the gallbladder. Therefore, a paracentesis was performed and majority of the perihepatic fluid was removed. 300 mL of cloudy amber colored fluid removed from the perihepatic space. The fluid appearance was  concerning for infection. Gallbladder wall was markedly thickened. Multiple echogenic stones. Catheter was successfully advanced into the gallbladder and the gallbladder was decompressed. 90 mL of clay colored purulent looking fluid was removed from the gallbladder. Gallbladder was decompressed at end of the procedure. IMPRESSION: Successful image guided percutaneous cholecystostomy tube placement. In addition, 300 mL perihepatic ascites was removed during the procedure. Appearance of the peritoneal fluid is concerning for infection and underlying gallbladder perforation. Gallbladder fluid and peritoneal fluid was sent for culture. Electronically Signed   By: Markus Daft M.D.   On: 08/30/2018 17:05    Anti-infectives: Anti-infectives (From admission, onward)   Start     Dose/Rate Route Frequency Ordered Stop   08/30/18 2000  piperacillin-tazobactam (ZOSYN) IVPB 3.375 g     3.375 g 12.5 mL/hr over 240 Minutes Intravenous Every 8 hours 08/30/18 1015     08/30/18 1100  vancomycin (VANCOCIN) IVPB 750 mg/150 ml premix     750 mg 150 mL/hr over 60 Minutes Intravenous Every 12 hours 08/30/18 1016     08/30/18 0715  ceFEPIme (MAXIPIME) 2 g in sodium chloride 0.9 % 100 mL IVPB     2 g 200 mL/hr over 30 Minutes Intravenous  Once 08/30/18 0713 08/30/18 0829   08/30/18 0715  metroNIDAZOLE (FLAGYL) IVPB 500 mg     500 mg 100 mL/hr over 60 Minutes Intravenous Every 8 hours 08/30/18 9381         Assessment/Plan Child C cirrhosis EtOH abuse CAD CHF  Acute calculus cholecystitis in setting of advanced cirrhosis S/p percutaneous cholecystostomy tube placement 11/16 - culture growing gram neg rods, report pending - drain is working  ID - zosyn/vanco/flagyl 11/16>> FEN - IVF, FLD VTE - SCDs Foley - none  Plan - Check abdominal xray, suspect ileus. Continue drain and antibiotics, follow culture. Encourage mobilization. Add daily miralax.   LOS: 2 days    Wellington Hampshire , [facility]  Surgery 09/01/2018, 8:28 AM Pager: [phone] Mon 7:00 am -11:30 AM Tues-Fri 7:00 am-4:30 pm Sat-Sun 7:00 am-11:30 am

## 2018-09-01 NOTE — Progress Notes (Signed)
Family Medicine Teaching Service Daily Progress Note Intern Pager: [phone]  Patient name: Hunter Carter Medical record number: [id number] Date of birth: 1951-01-23 Age: 67 y.o. Gender: male  Primary Care Provider: Nuala Alpha, DO Consultants: surgery, IR Code Status: DNR  Pt Overview and Major Events to Date:  11/16 admitted with sepsis 2/2 cholecystitis, perc cholecystostomy tube placed by IR 11/17 continued abd pain  Assessment and Plan: Hunter Carter a 67 y.o.malepresenting with diffuse abdominal pain, found to be septic with acute cholecystitis. PMH is significant forHTN, CAD s/p CABG in 2004 with pacemaker in place, paroxysmal atrial tachycardia, liver cirrhosis 2/2 chronic alcohol abuse and treated Hep C with previous hepatic encephalopathy, chronic diastolic CHF, and paroxysmal atrial fibrillation.   Sepsis 2/2 acute cholecystitis: s/p perc tube 11/16, white count stable.  S/p 2U FFP prior to perc tube.  Drain is flushing well.  Bilious fluid collected growing few grams variable rods, with WBCs predominantly PMNs.  Culture growing moderate Enterobacter resistant to Zosyn, but sensitive to cefepime. CT Abdomen 11/18 shows ascites causing distention and scattered colonic diverticula without focal source of infection. -serial abdominal exams -Consult IR - percutaneous cholecystostomy performed 11/17. Paracentesis 11/19. -N.p.o. at midnight 11/19. -Vitals q4 -reconsult CCM if continued hypotension -Cont IV Vancomycin and flagyl.  Add cefepime. Discontinue Zosyn. -Zofran PRN nausea -Daily CBC, CMP -NS 75cc/hr  Hypotension, improving: On admission blood pressure was 94/52, with most recent measurement being 110/74. -Monitor daily vitals -Continue holding metoprolol and Lasix  Hyperkalemia, resolved: K 5.4 on admission now 0.4.  -Monitor  Liver cirrhosis 2/2previousalcohol abuse and treated Hepatitis C: MELD-Na score 27, 27-32% 90-day mortality. Poor  synthetic function- INR 2.41. If further electrolyte derangements, we will need to involve GI.  -Hold home lasix in setting of AKI and hypotension  -Hold lactulose in setting of NPO status  -Consideration for adding spironolactone outpatient if stable at that time   SOB with dry cough, improving: CXR without acute cardiopulmonary disease. Likely related to pleurisy given edematous gallbladder. Removed O2 today on my exam. Patient received DuoNeb nebulizer. -Monitor symptoms -Treatment for gallbladder as above -Albuterol nebulizer every 4 hours as needed for wheezing  AKI:Cr 1.47 > 1.03 11/18. Baseline around 0.9. Likely in the setting of poor perfusion secondary to acute sepsis.  -Avoid nephrotoxic medications as possible  -daily BMP  -mIVF 75 ml/hr   Chronic HFpEF stable:Hypo-Euvolemic on exam. No recent echo or cath in records. Follows with Dr. Loletha Grayer.  -Consider echo in future if medically stable  -Holding home metoprolol and losartan in setting of hypotension  -Holding home statin  Paroxysmal ATand atrial fibwith pacemaker, stable: Follows with Dr. Loletha Grayer outpatient. NSR currently, recent pacemaker interrogation in 07/2018 showing low prevalence (0.7%) of atrial fibrillation. Takes metoprolol daily. Not on anticoagulation, auto anticoagulated due to liver cirrhosis. Was given 1mg  Metoprolol during SVT with HR 160s. -Cardiac monitoring -Amiodarone 150mg  given 11/18  -Hold home metoprolol   CAD s/p CABG in 2004, stable:EKG without ischemic changes, troponin <0.03.  -Hold home statin, not on secondary prevention with aspirin daily given previous GI bleed and severe liver cirrhosis.  FEN/GI: clears, none PPx: SCDs  Disposition: stepdown  Subjective:  Patient is seen this morning sitting upright in his recliner 16, severity of pain increasing with movement and leaning forward to auscultate lungs.  He states he has a limited appetite and that his abdomen hurts about the same as  it did when he came in.  He reports continued left-sided chest pain with inhalation.  He has no new concerns at this time.  Objective: Temp:  [98.3 F (36.8 C)-98.5 F (36.9 C)] 98.4 F (36.9 C) (11/18 0015) Pulse Rate:  [81-90] 89 (11/18 0450) Resp:  [18-27] 23 (11/18 0450) BP: (100-127)/(63-86) 100/86 (11/18 0450) SpO2:  [93 %-96 %] 93 % (11/18 0450)  Physical Exam  Constitutional: He is oriented to person, place, and time. He appears well-developed and well-nourished. He appears ill.  Cardiovascular: Normal rate, regular rhythm and normal heart sounds.  Pulmonary/Chest: Effort normal and breath sounds normal.  Abdominal: Bowel sounds are normal. He exhibits distension. There is rebound.  Neurological: He is alert and oriented to person, place, and time.  Skin: Skin is warm and dry. Capillary refill takes less than 2 seconds.  Psychiatric: He has a normal mood and affect.   Laboratory: Recent Labs  Lab 08/30/18 0705 08/31/18 0429 09/01/18 0602  WBC 12.5* 11.4* 11.2*  HGB 14.4 11.9* 12.8*  HCT 44.6 35.7* 40.9  PLT 61* 57* 58*   Recent Labs  Lab 08/31/18 0429 08/31/18 0811 08/31/18 1141 09/01/18 0602  NA 133* 131* 129* 131*  K 5.4* 5.8* 4.5 4.6  CL 105 104 102 102  CO2 23 22 22 22   BUN 15 17 17 14   CREATININE 1.31* 1.23 1.26* 1.03  CALCIUM 7.8* 7.8* 7.8* 7.9*  PROT 7.2  --  7.3 8.3*  BILITOT 5.1*  --  4.7* 4.5*  ALKPHOS 79  --  71 80  ALT 15  --  16 16  AST 32  --  33 32  GLUCOSE 87 103* 146* 93   Imaging/Diagnostic Tests: Dg Abd 1 View  Result Date: 08/31/2018 CLINICAL DATA:  Sepsis EXAM: ABDOMEN - 1 VIEW COMPARISON:  08/30/2018 FINDINGS: Scattered large and small bowel gas is noted. Contrast material is noted within the bladder from previous CT examination. Multiple gallstones and cholecystostomy tube are noted in the right upper quadrant. No free air is seen. Degenerative changes of lumbar spine are noted. IMPRESSION: Cholelithiasis and cholecystostomy tube.  No acute abnormality noted. Electronically Signed   By: Inez Catalina M.D.   On: 08/31/2018 09:36   Ct Angio Chest Pe W Or Wo Contrast  Result Date: 08/30/2018 CLINICAL DATA:  Central abdominal pain and pressure since last night. History of myocardial infarction and cirrhosis. EXAM: CT ANGIOGRAPHY CHEST CT ABDOMEN AND PELVIS WITH CONTRAST TECHNIQUE: Multidetector CT imaging of the chest was performed using the standard protocol during bolus administration of intravenous contrast. Multiplanar CT image reconstructions and MIPs were obtained to evaluate the vascular anatomy. Multidetector CT imaging of the abdomen and pelvis was performed using the standard protocol during bolus administration of intravenous contrast. CONTRAST:  123mL ISOVUE-370 IOPAMIDOL (ISOVUE-370) INJECTION 76% COMPARISON:  Abdominopelvic CT 07/20/2018. FINDINGS: CTA CHEST FINDINGS Cardiovascular: The pulmonary arteries are well opacified with contrast to the level of the subsegmental branches. There is no evidence of acute pulmonary embolism. There is diffuse atherosclerosis of the aorta, great vessels and coronary arteries status post median sternotomy and CABG. Left subclavian pacemaker leads extend into the right atrium and right ventricle. The heart is mildly enlarged. There is no pericardial effusion. Mediastinum/Nodes: There are no enlarged mediastinal, hilar or axillary lymph nodes. There is a small hiatal hernia. Distal esophageal varices are again noted. The trachea and thyroid gland appear unremarkable. Lungs/Pleura: Trace bilateral pleural effusions. There is no pneumothorax. Mild dependent atelectasis at both lung bases. No confluent airspace opacity or suspicious pulmonary nodule. Musculoskeletal/Chest wall: There is no chest wall  mass or suspicious osseous finding. Review of the MIP images confirms the above findings. CT ABDOMEN and PELVIS FINDINGS Hepatobiliary: There are diffuse morphologic changes of cirrhosis. No focal  hepatic lesion or abnormal enhancement identified. There is a large recanalized left paraumbilical vein. The portal vein is patent. There are multiple calcified gallstones. There is new diffuse gallbladder wall thickening with pericholecystic fluid inferiorly, highly suspicious for acute cholecystitis. No significant biliary dilatation demonstrated. Pancreas: Unremarkable. No pancreatic ductal dilatation or surrounding inflammatory changes. Spleen: Normal in size without focal abnormality. Adrenals/Urinary Tract: Both adrenal glands appear normal. There are tiny low-density renal lesions which are likely cysts. No evidence of urinary tract calculus or hydronephrosis. The bladder appears normal. Stomach/Bowel: The stomach and small bowel demonstrate no significant findings. There is mild wall thickening throughout the proximal colon, likely related to the patient's underlying liver disease. No focal bowel wall thickening, significant distention or surrounding inflammation identified. Mild sigmoid diverticulosis. Vascular/Lymphatic: Mildly prominent lymph nodes in the porta hepatis are stable. There is moderate diffuse aortic and branch vessel atherosclerosis. As above, there is a recanalized paraumbilical vein with multiple large venous collaterals in the anterior abdominal wall and distal esophageal varices. No acute vascular findings are seen. Reproductive: There stable asymmetric calcifications within the left prostate lobe. The seminal vesicles appear normal. Other: There is a small amount of ascites around the liver and in the pelvis. There is mild diffuse mesenteric edema. No focal extraluminal fluid collections are seen. Musculoskeletal: No acute or significant osseous findings. Lower lumbar spondylosis, similar to previous examination. IMPRESSION: 1. New extensive gallbladder wall thickening and pericholecystic fluid highly suspicious for acute cholecystitis. There are multiple underlying calcified  gallstones. No significant biliary dilatation. 2. Cirrhosis and portal hypertension with multiple varices, similar to previous study. 3. Ascites and diffuse mesenteric edema. 4. No acute vascular findings are identified. There is no evidence of acute pulmonary embolism. Aortic Atherosclerosis (ICD10-I70.0). 5. Preliminary results have been discussed with Dr. Ellender Hose. Electronically Signed   By: Richardean Sale M.D.   On: 08/30/2018 10:53   Ct Abdomen Pelvis W Contrast  Result Date: 08/30/2018 CLINICAL DATA:  Central abdominal pain and pressure since last night. History of myocardial infarction and cirrhosis. EXAM: CT ANGIOGRAPHY CHEST CT ABDOMEN AND PELVIS WITH CONTRAST TECHNIQUE: Multidetector CT imaging of the chest was performed using the standard protocol during bolus administration of intravenous contrast. Multiplanar CT image reconstructions and MIPs were obtained to evaluate the vascular anatomy. Multidetector CT imaging of the abdomen and pelvis was performed using the standard protocol during bolus administration of intravenous contrast. CONTRAST:  164mL ISOVUE-370 IOPAMIDOL (ISOVUE-370) INJECTION 76% COMPARISON:  Abdominopelvic CT 07/20/2018. FINDINGS: CTA CHEST FINDINGS Cardiovascular: The pulmonary arteries are well opacified with contrast to the level of the subsegmental branches. There is no evidence of acute pulmonary embolism. There is diffuse atherosclerosis of the aorta, great vessels and coronary arteries status post median sternotomy and CABG. Left subclavian pacemaker leads extend into the right atrium and right ventricle. The heart is mildly enlarged. There is no pericardial effusion. Mediastinum/Nodes: There are no enlarged mediastinal, hilar or axillary lymph nodes. There is a small hiatal hernia. Distal esophageal varices are again noted. The trachea and thyroid gland appear unremarkable. Lungs/Pleura: Trace bilateral pleural effusions. There is no pneumothorax. Mild dependent  atelectasis at both lung bases. No confluent airspace opacity or suspicious pulmonary nodule. Musculoskeletal/Chest wall: There is no chest wall mass or suspicious osseous finding. Review of the MIP images confirms the above findings. CT  ABDOMEN and PELVIS FINDINGS Hepatobiliary: There are diffuse morphologic changes of cirrhosis. No focal hepatic lesion or abnormal enhancement identified. There is a large recanalized left paraumbilical vein. The portal vein is patent. There are multiple calcified gallstones. There is new diffuse gallbladder wall thickening with pericholecystic fluid inferiorly, highly suspicious for acute cholecystitis. No significant biliary dilatation demonstrated. Pancreas: Unremarkable. No pancreatic ductal dilatation or surrounding inflammatory changes. Spleen: Normal in size without focal abnormality. Adrenals/Urinary Tract: Both adrenal glands appear normal. There are tiny low-density renal lesions which are likely cysts. No evidence of urinary tract calculus or hydronephrosis. The bladder appears normal. Stomach/Bowel: The stomach and small bowel demonstrate no significant findings. There is mild wall thickening throughout the proximal colon, likely related to the patient's underlying liver disease. No focal bowel wall thickening, significant distention or surrounding inflammation identified. Mild sigmoid diverticulosis. Vascular/Lymphatic: Mildly prominent lymph nodes in the porta hepatis are stable. There is moderate diffuse aortic and branch vessel atherosclerosis. As above, there is a recanalized paraumbilical vein with multiple large venous collaterals in the anterior abdominal wall and distal esophageal varices. No acute vascular findings are seen. Reproductive: There stable asymmetric calcifications within the left prostate lobe. The seminal vesicles appear normal. Other: There is a small amount of ascites around the liver and in the pelvis. There is mild diffuse mesenteric edema. No  focal extraluminal fluid collections are seen. Musculoskeletal: No acute or significant osseous findings. Lower lumbar spondylosis, similar to previous examination. IMPRESSION: 1. New extensive gallbladder wall thickening and pericholecystic fluid highly suspicious for acute cholecystitis. There are multiple underlying calcified gallstones. No significant biliary dilatation. 2. Cirrhosis and portal hypertension with multiple varices, similar to previous study. 3. Ascites and diffuse mesenteric edema. 4. No acute vascular findings are identified. There is no evidence of acute pulmonary embolism. Aortic Atherosclerosis (ICD10-I70.0). 5. Preliminary results have been discussed with Dr. Ellender Hose. Electronically Signed   By: Richardean Sale M.D.   On: 08/30/2018 10:53   Ir Perc Cholecystostomy  Result Date: 08/30/2018 INDICATION: 67 year old with acute cholecystitis, sepsis and end-stage liver disease. Patient is not a surgical candidate due to his liver disease. Plan for percutaneous cholecystostomy tube placement. Patient was given FFP prior to the procedure. EXAM: IMAGE GUIDED PERCUTANEOUS CHOLECYSTOSTOMY TUBE PLACEMENT ULTRASOUND-GUIDED PARACENTESIS MEDICATIONS: Patient received IV antibiotics in the emergency department prior to the procedure. ANESTHESIA/SEDATION: Moderate (conscious) sedation was employed during this procedure. A total of Versed 1.5 mg and Fentanyl 75 mcg was administered intravenously. Moderate Sedation Time: 13 minutes. The patient's level of consciousness and vital signs were monitored continuously by radiology nursing throughout the procedure under my direct supervision. FLUOROSCOPY TIME:  Fluoroscopy Time: 42 seconds, 9 mGy COMPLICATIONS: None immediate. PROCEDURE: Informed written consent was obtained from the patient after a thorough discussion of the procedural risks, benefits and alternatives. All questions were addressed. A timeout was performed prior to the initiation of the  procedure. Patient was placed supine on the interventional table. Gallbladder and liver was evaluated with ultrasound. Right abdomen was prepped and draped in sterile fashion. Maximal barrier sterile technique was utilized including caps, mask, sterile gowns, sterile gloves, sterile drape, hand hygiene and skin antiseptic. Skin was anesthetized with 1% lidocaine. Due to the perihepatic ascites, a Safe-T-Centesis catheter was directed in the perihepatic space with ultrasound guidance. 300 mL of cloudy amber colored fluid was removed. Attention was directed to the cholecystostomy tube placement. After the paracentesis was complete, the skin was anesthetized with 1% lidocaine near the gallbladder. 21 gauge needle  was directed into the gallbladder fundus with ultrasound guidance. 0.018 wire was advanced into the gallbladder. The tract was dilated with an Accustick set. A J wire was advanced into the gallbladder and the tract was dilated to accommodate a 10.2 [place] multipurpose drain. 90 mL of clay colored purulent looking fluid was removed from the gallbladder. The gallbladder was decompressed based on ultrasound at the end of the procedure. Catheter was sutured to skin and attached to a gravity bag. Paracentesis catheter was also removed at the end of the procedure. FINDINGS: Initial ultrasound images demonstrate small to moderate amount of perihepatic ascites and fluid around the gallbladder. Therefore, a paracentesis was performed and majority of the perihepatic fluid was removed. 300 mL of cloudy amber colored fluid removed from the perihepatic space. The fluid appearance was concerning for infection. Gallbladder wall was markedly thickened. Multiple echogenic stones. Catheter was successfully advanced into the gallbladder and the gallbladder was decompressed. 90 mL of clay colored purulent looking fluid was removed from the gallbladder. Gallbladder was decompressed at end of the procedure. IMPRESSION: Successful  image guided percutaneous cholecystostomy tube placement. In addition, 300 mL perihepatic ascites was removed during the procedure. Appearance of the peritoneal fluid is concerning for infection and underlying gallbladder perforation. Gallbladder fluid and peritoneal fluid was sent for culture. Electronically Signed   By: Markus Daft M.D.   On: 08/30/2018 17:05   Dg Chest Portable 1 View  Result Date: 08/30/2018 CLINICAL DATA:  Abdominal pain with nausea, vomiting and cough for 3 weeks. History congestive heart failure and pacemaker. EXAM: PORTABLE CHEST 1 VIEW COMPARISON:  03/20/2018 and 09/07/2017 radiographs. FINDINGS: 0732 hour. The left subclavian pacemaker leads appear unchanged within the right atrium and right ventricle. The heart size is stable status post median sternotomy and CABG. The lungs remain clear. There is no pleural effusion or pneumothorax. The bones appear unchanged. Telemetry leads overlie the chest. IMPRESSION: Stable chest post CABG.  No active cardiopulmonary process. Electronically Signed   By: Richardean Sale M.D.   On: 08/30/2018 08:07   Dg Abd Portable 1v  Result Date: 09/01/2018 CLINICAL DATA:  Three-week history of lower abdominal pain. The patient reports the abdomen is distended and firm on the lower left. No bowel habit changes or nausea or vomiting. History of gastric ulcer disease with bleeding, percutaneous cholecystostomy on August 30, 2018 and paracentesis on the same day. EXAM: PORTABLE ABDOMEN - 1 VIEW COMPARISON:  Abdominal radiographs of August 31, 2018. FINDINGS: The bowel gas pattern is within the limits of normal. Multiple gallstones are noted in the right upper quadrant with a cholecystostomy tube present and stable. No bony abnormalities are observed. IMPRESSION: Fairly normal appearing bowel gas pattern. Known gallstones and cholecystostomy tube. No significant change since the previous study. Electronically Signed   By: David  [place]ique M.D.   On:  09/01/2018 09:10   Ir Paracentesis  Result Date: 08/31/2018 INDICATION: 67 year old with acute cholecystitis, sepsis and end-stage liver disease. Patient is not a surgical candidate due to his liver disease. Plan for percutaneous cholecystostomy tube placement. Patient was given FFP prior to the procedure. EXAM: IMAGE GUIDED PERCUTANEOUS CHOLECYSTOSTOMY TUBE PLACEMENT ULTRASOUND-GUIDED PARACENTESIS MEDICATIONS: Patient received IV antibiotics in the emergency department prior to the procedure. ANESTHESIA/SEDATION: Moderate (conscious) sedation was employed during this procedure. A total of Versed 1.5 mg and Fentanyl 75 mcg was administered intravenously. Moderate Sedation Time: 13 minutes. The patient's level of consciousness and vital signs were monitored continuously by radiology nursing throughout the procedure under  my direct supervision. FLUOROSCOPY TIME:  Fluoroscopy Time: 42 seconds, 9 mGy COMPLICATIONS: None immediate. PROCEDURE: Informed written consent was obtained from the patient after a thorough discussion of the procedural risks, benefits and alternatives. All questions were addressed. A timeout was performed prior to the initiation of the procedure. Patient was placed supine on the interventional table. Gallbladder and liver was evaluated with ultrasound. Right abdomen was prepped and draped in sterile fashion. Maximal barrier sterile technique was utilized including caps, mask, sterile gowns, sterile gloves, sterile drape, hand hygiene and skin antiseptic. Skin was anesthetized with 1% lidocaine. Due to the perihepatic ascites, a Safe-T-Centesis catheter was directed in the perihepatic space with ultrasound guidance. 300 mL of cloudy amber colored fluid was removed. Attention was directed to the cholecystostomy tube placement. After the paracentesis was complete, the skin was anesthetized with 1% lidocaine near the gallbladder. 21 gauge needle was directed into the gallbladder fundus with  ultrasound guidance. 0.018 wire was advanced into the gallbladder. The tract was dilated with an Accustick set. A J wire was advanced into the gallbladder and the tract was dilated to accommodate a 10.2 [place] multipurpose drain. 90 mL of clay colored purulent looking fluid was removed from the gallbladder. The gallbladder was decompressed based on ultrasound at the end of the procedure. Catheter was sutured to skin and attached to a gravity bag. Paracentesis catheter was also removed at the end of the procedure. FINDINGS: Initial ultrasound images demonstrate small to moderate amount of perihepatic ascites and fluid around the gallbladder. Therefore, a paracentesis was performed and majority of the perihepatic fluid was removed. 300 mL of cloudy amber colored fluid removed from the perihepatic space. The fluid appearance was concerning for infection. Gallbladder wall was markedly thickened. Multiple echogenic stones. Catheter was successfully advanced into the gallbladder and the gallbladder was decompressed. 90 mL of clay colored purulent looking fluid was removed from the gallbladder. Gallbladder was decompressed at end of the procedure. IMPRESSION: Successful image guided percutaneous cholecystostomy tube placement. In addition, 300 mL perihepatic ascites was removed during the procedure. Appearance of the peritoneal fluid is concerning for infection and underlying gallbladder perforation. Gallbladder fluid and peritoneal fluid was sent for culture. Electronically Signed   By: Markus Daft M.D.   On: 08/30/2018 17:05   Daisy Floro, DO 09/01/2018, 10:04 AM PGY-1, [place] Intern pager: [phone], text pages welcome

## 2018-09-01 NOTE — Progress Notes (Signed)
Pharmacy Antibiotic Note  Hunter Carter is a 67 y.o. male admitted on 08/30/2018 with sepsis.  Pharmacy has been consulted for Vanco/Zosyn dosing>> now consulted to start Cefepime. Zosyn discontinued based on culture results today.   CC/HPI: Abdominal pain with NVD  PMH: hepatic encephalopathy, CAD, CHF, cirrhosis, chronic EtOH abuse   Significant events: Recent hospitalization for cholecystitis  ID: Recent buttock wound and cholecystitis. (discharged on [place] x 7d).  S/p perc cholecystectomy.    Day # 3  IV Antibiotics Vancomycin , Flagyl and Zosyn  for  Acute calculus cholecystitis.  LA 3.64. WBC 11.2.  S/p perc cholecystectomy. SCr improved to 1.08, CrCl ~ 74 ml/min.  11/16 peritoneal cavity fluid: enterobacter: R to ancef, fortaz, rocephin, zosyn.  S to cefepime cipro, imipenem, septra 11/16 Wound cx: mod GNR pending  11/16: ucx; negative  11/16 Bcx; ngtd    Vanco 11/16>> Zosyn 11/16>>11/18 Flagyl 11/16>> Cefepime 11/16 x 1; 11/18>>  Plan: Zosyn discontinued.   Start Cefepime 1g IV q8 hr Vancomycin 750mg  IV q 12h hrs. Trough at steady stae Flagyl 500mg  IV q 8 hrs Consider discontinuing flagyl and vancomycin    Height: 5\' 6"  (167.6 cm) Weight: 212 lb 15.4 oz (96.6 kg) IBW/kg (Calculated) : 63.8  Temp (24hrs), Avg:98.4 F (36.9 C), Min:98.3 F (36.8 C), Max:98.5 F (36.9 C)  Recent Labs  Lab 08/30/18 0705 08/30/18 0719 08/30/18 0938 08/30/18 1253 08/30/18 1452 08/31/18 0429 08/31/18 0811 08/31/18 1141 09/01/18 0602 09/01/18 0928  WBC 12.5*  --   --   --   --  11.4*  --   --  11.2*  --   CREATININE 1.47*  --   --   --   --  1.31* 1.23 1.26* 1.03 1.08  LATICACIDVEN  --  4.30* 3.64* 3.26* 3.43*  --   --  3.2*  --   --     Estimated Creatinine Clearance: 74.2 mL/min (by C-G formula based on SCr of 1.08 mg/dL).    No Known Allergies   Thank you for allowing pharmacy to be part of this patients care team. Nicole Cella, RPh Clinical Pharmacist Please check  AMION for all [place] phone numbers After 10:00 PM, call [place] 09/01/2018 1:07 PM

## 2018-09-01 NOTE — Progress Notes (Signed)
Paged to patient's room due to a report of sustained ventricular tachycardia.  Patient appeared mildly uncomfortable but had no increased shortness of breath or increased chest pain.  He was able to calmly talk with me and had no AMS.  Monitor showed an SVT with a rate of around 160 bpm.  Blood pressure continued to be within normal limits.  A stat cardiology consult was placed and labs including a BMP and magnesium were ordered.  A stat EKG was also ordered.  EKG showed atrial flutter with variable AV block.  Cardiology evaluated patient and started amiodarone IV and Lopressor IV for rate and rhythm control.  We will increase the frequency of patient's PRN morphine from every 4 hours to every 2 hours since patient's pain may be exacerbating his tachycardia.

## 2018-09-01 NOTE — Progress Notes (Addendum)
Pt c/o increased pain and shortness of breath. Pt's abdomen is very taut and distended, and has increased in tenderness throughout the night. Pt is tachypneic, and states that he can't catch his breath. O2 sats are in low to mid 90s on 2 L of O2. Flushed drain with 5 cc's of normal saline, and it flushes well. Paged MD to make him aware. Will continue to monitor.

## 2018-09-01 NOTE — Consult Note (Addendum)
CARDIOLOGY CONSULT NOTE    Patient ID: Hunter Carter MRN: [id number], DOB/AGE: 1951-02-09 67 y.o.  Admit date: 08/30/2018 Date of Consult: 09/01/2018  Primary Physician: Nuala Alpha, DO Primary Cardiologist: Calee Nugent  Patient Profile: Hunter Carter is a 67 y.o. male with a history of CAD s/p CABG, paroxysmal atrial fibrillation, Mobitz II heart block s/p MDT dual chamber PPM, chronic liver disease who is being seen today for the evaluation of atrial flutter with RVR at the request of Hunter Carter.  HPI:  Hunter Carter is a 67 y.o. male with the above past medical history. He was admitted on 08/30/18 with acute cholecystitis in the setting of advanced cirrhosis and sepsis. He is s/p tube placement 11/16. Blood cultures positive for gram negative rods. Today, he developed increased abdominal pain as well as shortness of breath.  Telemetry demonstrated wide complex tachycardia at a rate of 160bpm.  Cardiology was asked to evaluate for treatment options. Device interrogation demonstrated atrial flutter that began this morning at 3AM.  He reports ongoing abdominal pain and some increased shortness of breath.   Past Medical History:  Diagnosis Date  . Acute hepatic encephalopathy 10/10/2015   Hunter Carter 10/10/2015  . Arthritis    "joints ache" (10/10/2015)  . Bleeding stomach ulcer    "recently" (10/10/2015)  . CAD (coronary artery disease)   . CHF (congestive heart failure) ([place])   . Cirrhosis ([place])    due to HCV/notes 10/10/2015  . ETOH abuse   . GERD (gastroesophageal reflux disease)   . Heart block AV second degree    permanent pacemaker 02/2004  . Hepatitis C   . Hyperlipidemia   . Inferior MI ([place]) 2005  . Presence of permanent cardiac pacemaker   . RBBB   . Systemic hypertension   . Tubulovillous adenoma of colon   . Ventricular tachycardia ([facility])    nonsustained     Surgical History:  Past Surgical History:  Procedure Laterality Date  . CARDIAC  CATHETERIZATION  03/06/04   significant 3 vessel disease, totalled graft obtuse marginal  . CORONARY ARTERY BYPASS GRAFT  02/10/03   LIMA to LAD,SVG to first diagonal, SVT to OM1  . ESOPHAGOGASTRODUODENOSCOPY N/A 07/29/2015   Procedure: ESOPHAGOGASTRODUODENOSCOPY (EGD);  Surgeon: Clarene Essex, MD;  Location: [facility] END[facility]COPY;  Service: Endoscopy;  Laterality: N/A;  . INSERT / REPLACE / REMOVE PACEMAKER  03/2010   [employer]  . IR PARACENTESIS  08/30/2018  . IR PERC CHOLECYSTOSTOMY  08/30/2018  . KNEE ARTHROSCOPY Left [phone]  . MEDIASTINAL EXPLORATION  02/10/2003  . NM MYOCAR PERF WALL MOTION  04/05/09   low risk scan-abnormal-mod. perfusion defect basal inferoseptal,basal inferior,mid inferoseptal,mid inferior and apical inferior regions  . TOTAL KNEE ARTHROPLASTY Left   . US ECHOCARDIOGRAPHY  01/20/07   mild MR,mild to mod. TR,trace AI,mild PI     Medications Prior to Admission  Medication Sig Dispense Refill Last Dose  . furosemide (LASIX) 40 MG tablet TAKE 1 TABLET BY MOUTH  DAILY AS NEEDED (Patient taking differently: Take 40 mg by mouth daily. Make take an additional 40 mg if needed) 90 tablet 0 08/29/2018 at Unknown time  . lactulose (CHRONULAC) 10 GM/15ML solution Take 45 mLs (30 g total) by mouth 2 (two) times daily. Titrate for 3-4 BM's/ day (Patient taking differently: Take 30 g by mouth 3 (three) times daily. Titrate for 3-4 BM's/ day) 240 mL 5 08/29/2018 at Unknown time  . losartan (COZAAR) 25 MG tablet TAKE 1 TABLET BY MOUTH  DAILY (Patient taking  differently: Take 25 mg by mouth daily. ) 90 tablet 0 08/29/2018 at Unknown time  . metoprolol tartrate (LOPRESSOR) 50 MG tablet TAKE 1 TABLET BY MOUTH TWO  TIMES DAILY (Patient taking differently: Take 50 mg by mouth daily. ) 180 tablet 3 08/29/2018 at 0700  . omeprazole (PRILOSEC) 20 MG capsule TAKE 1 CAPSULE BY MOUTH  DAILY (Patient taking differently: Take 20 mg by mouth daily. ) 30 capsule 3 08/29/2018 at Unknown time  . PROAIR HFA 108  (90 Base) MCG/ACT inhaler USE 2 PUFFS BY MOUTH EVERY  4 HOURS AS NEEDED FOR  WHEEZING OR SHORTNESS OF  BREATH (OR COUGHING) (Patient taking differently: Inhale 1-2 puffs into the lungs every 4 (four) hours as needed for wheezing. ) 8.5 g 2 08/29/2018 at prn  . simvastatin (ZOCOR) 20 MG tablet TAKE 1 TABLET BY MOUTH  DAILY (Patient taking differently: Take 20 mg by mouth daily at 6 PM. ) 90 tablet 0 08/29/2018 at Unknown time    Inpatient Medications:  . amiodarone  150 mg Intravenous Once  . docusate sodium  100 mg Oral BID  . metoprolol tartrate  5 mg Intravenous Once  . polyethylene glycol  17 g Oral Daily    Allergies: No Known Allergies  Social History   Socioeconomic History  . Marital status: Widowed    Spouse name: Not on file  . Number of children: Not on file  . Years of education: Not on file  . Highest education level: Not on file  Occupational History  . Not on file  Social Needs  . Financial resource strain: Not on file  . Food insecurity:    Worry: Not on file    Inability: Not on file  . Transportation needs:    Medical: Not on file    Non-medical: Not on file  Tobacco Use  . Smoking status: Former Smoker    Packs/day: 0.25    Years: 41.00    Pack years: 10.25    Types: Cigarettes    Last attempt to quit: 11/15/2017    Years since quitting: 0.7  . Smokeless tobacco: Never Used  Substance and Sexual Activity  . Alcohol use: Yes    Comment: 10/10/2015 "last drink was 2-3 months ago; h/o abuse"  . Drug use: Yes    Types: Marijuana    Comment: last use 2015  . Sexual activity: Not on file  Lifestyle  . Physical activity:    Days per week: Not on file    Minutes per session: Not on file  . Stress: Not on file  Relationships  . Social connections:    Talks on phone: Not on file    Gets together: Not on file    Attends religious service: Not on file    Active member of club or organization: Not on file    Attends meetings of clubs or organizations: Not  on file    Relationship status: Not on file  . Intimate partner violence:    Fear of current or ex partner: Not on file    Emotionally abused: Not on file    Physically abused: Not on file    Forced sexual activity: Not on file  Other Topics Concern  . Not on file  Social History Narrative  . Not on file     Family History  Problem Relation Age of Onset  . Heart failure Mother   . Liver disease Father   . Heart failure Sister   . Hypertension  Sister   . Hypertension Sister   . Colon cancer Neg Hx      Review of Systems: All other systems reviewed and are otherwise negative except as noted above.  Physical Exam: Vitals:   09/01/18 0405 09/01/18 0420 09/01/18 0425 09/01/18 0450  BP: 127/79   100/86  Pulse: 89 87 87 89  Resp: (!) 21 (!) 27 (!) 24 (!) 23  Temp:      TempSrc:      SpO2: 96% 94% 95% 93%  Weight:      Height:        GEN- The patient is elderly and chronically ill appearing, alert and oriented x 3 today.   HEENT: normocephalic, atraumatic; sclera clear, conjunctiva pink; hearing intact; oropharynx clear; neck supple Lungs- Clear to ausculation bilaterally, normal work of breathing.  No wheezes, rales, rhonchi Heart- Tachycardic irregular rate and rhythm  GI- +tender Extremities- no clubbing, cyanosis, or edema  MS- no significant deformity or atrophy Skin- warm and dry, no rash or lesion Psych- euthymic mood, full affect Neuro- strength and sensation are intact  Labs:   Lab Results  Component Value Date   WBC 11.2 (H) 09/01/2018   HGB 12.8 (L) 09/01/2018   HCT 40.9 09/01/2018   MCV 99.5 09/01/2018   PLT 58 (L) 09/01/2018    Recent Labs  Lab 09/01/18 0602  NA 131*  K 4.6  CL 102  CO2 22  BUN 14  CREATININE 1.03  CALCIUM 7.9*  PROT 8.3*  BILITOT 4.5*  ALKPHOS 80  ALT 16  AST 32  GLUCOSE 93      Radiology/Studies: Dg Abd 1 View  Result Date: 08/31/2018 CLINICAL DATA:  Sepsis EXAM: ABDOMEN - 1 VIEW COMPARISON:  08/30/2018  FINDINGS: Scattered large and small bowel gas is noted. Contrast material is noted within the bladder from previous CT examination. Multiple gallstones and cholecystostomy tube are noted in the right upper quadrant. No free air is seen. Degenerative changes of lumbar spine are noted. IMPRESSION: Cholelithiasis and cholecystostomy tube. No acute abnormality noted. Electronically Signed   By: Inez Catalina M.D.   On: 08/31/2018 09:36   Ct Angio Chest Pe W Or Wo Contrast  Result Date: 08/30/2018 CLINICAL DATA:  Central abdominal pain and pressure since last night. History of myocardial infarction and cirrhosis. EXAM: CT ANGIOGRAPHY CHEST CT ABDOMEN AND PELVIS WITH CONTRAST TECHNIQUE: Multidetector CT imaging of the chest was performed using the standard protocol during bolus administration of intravenous contrast. Multiplanar CT image reconstructions and MIPs were obtained to evaluate the vascular anatomy. Multidetector CT imaging of the abdomen and pelvis was performed using the standard protocol during bolus administration of intravenous contrast. CONTRAST:  115mL ISOVUE-370 IOPAMIDOL (ISOVUE-370) INJECTION 76% COMPARISON:  Abdominopelvic CT 07/20/2018. FINDINGS: CTA CHEST FINDINGS Cardiovascular: The pulmonary arteries are well opacified with contrast to the level of the subsegmental branches. There is no evidence of acute pulmonary embolism. There is diffuse atherosclerosis of the aorta, great vessels and coronary arteries status post median sternotomy and CABG. Left subclavian pacemaker leads extend into the right atrium and right ventricle. The heart is mildly enlarged. There is no pericardial effusion. Mediastinum/Nodes: There are no enlarged mediastinal, hilar or axillary lymph nodes. There is a small hiatal hernia. Distal esophageal varices are again noted. The trachea and thyroid gland appear unremarkable. Lungs/Pleura: Trace bilateral pleural effusions. There is no pneumothorax. Mild dependent  atelectasis at both lung bases. No confluent airspace opacity or suspicious pulmonary nodule. Musculoskeletal/Chest wall: There is  no chest wall mass or suspicious osseous finding. Review of the MIP images confirms the above findings. CT ABDOMEN and PELVIS FINDINGS Hepatobiliary: There are diffuse morphologic changes of cirrhosis. No focal hepatic lesion or abnormal enhancement identified. There is a large recanalized left paraumbilical vein. The portal vein is patent. There are multiple calcified gallstones. There is new diffuse gallbladder wall thickening with pericholecystic fluid inferiorly, highly suspicious for acute cholecystitis. No significant biliary dilatation demonstrated. Pancreas: Unremarkable. No pancreatic ductal dilatation or surrounding inflammatory changes. Spleen: Normal in size without focal abnormality. Adrenals/Urinary Tract: Both adrenal glands appear normal. There are tiny low-density renal lesions which are likely cysts. No evidence of urinary tract calculus or hydronephrosis. The bladder appears normal. Stomach/Bowel: The stomach and small bowel demonstrate no significant findings. There is mild wall thickening throughout the proximal colon, likely related to the patient's underlying liver disease. No focal bowel wall thickening, significant distention or surrounding inflammation identified. Mild sigmoid diverticulosis. Vascular/Lymphatic: Mildly prominent lymph nodes in the porta hepatis are stable. There is moderate diffuse aortic and branch vessel atherosclerosis. As above, there is a recanalized paraumbilical vein with multiple large venous collaterals in the anterior abdominal wall and distal esophageal varices. No acute vascular findings are seen. Reproductive: There stable asymmetric calcifications within the left prostate lobe. The seminal vesicles appear normal. Other: There is a small amount of ascites around the liver and in the pelvis. There is mild diffuse mesenteric edema. No  focal extraluminal fluid collections are seen. Musculoskeletal: No acute or significant osseous findings. Lower lumbar spondylosis, similar to previous examination. IMPRESSION: 1. New extensive gallbladder wall thickening and pericholecystic fluid highly suspicious for acute cholecystitis. There are multiple underlying calcified gallstones. No significant biliary dilatation. 2. Cirrhosis and portal hypertension with multiple varices, similar to previous study. 3. Ascites and diffuse mesenteric edema. 4. No acute vascular findings are identified. There is no evidence of acute pulmonary embolism. Aortic Atherosclerosis (ICD10-I70.0). 5. Preliminary results have been discussed with Hunter. Ellender Carter. Electronically Signed   By: Richardean Sale M.D.   On: 08/30/2018 10:53   Ct Abdomen Pelvis W Contrast  Result Date: 08/30/2018 CLINICAL DATA:  Central abdominal pain and pressure since last night. History of myocardial infarction and cirrhosis. EXAM: CT ANGIOGRAPHY CHEST CT ABDOMEN AND PELVIS WITH CONTRAST TECHNIQUE: Multidetector CT imaging of the chest was performed using the standard protocol during bolus administration of intravenous contrast. Multiplanar CT image reconstructions and MIPs were obtained to evaluate the vascular anatomy. Multidetector CT imaging of the abdomen and pelvis was performed using the standard protocol during bolus administration of intravenous contrast. CONTRAST:  120mL ISOVUE-370 IOPAMIDOL (ISOVUE-370) INJECTION 76% COMPARISON:  Abdominopelvic CT 07/20/2018. FINDINGS: CTA CHEST FINDINGS Cardiovascular: The pulmonary arteries are well opacified with contrast to the level of the subsegmental branches. There is no evidence of acute pulmonary embolism. There is diffuse atherosclerosis of the aorta, great vessels and coronary arteries status post median sternotomy and CABG. Left subclavian pacemaker leads extend into the right atrium and right ventricle. The heart is mildly enlarged. There is no  pericardial effusion. Mediastinum/Nodes: There are no enlarged mediastinal, hilar or axillary lymph nodes. There is a small hiatal hernia. Distal esophageal varices are again noted. The trachea and thyroid gland appear unremarkable. Lungs/Pleura: Trace bilateral pleural effusions. There is no pneumothorax. Mild dependent atelectasis at both lung bases. No confluent airspace opacity or suspicious pulmonary nodule. Musculoskeletal/Chest wall: There is no chest wall mass or suspicious osseous finding. Review of the MIP images confirms the above  findings. CT ABDOMEN and PELVIS FINDINGS Hepatobiliary: There are diffuse morphologic changes of cirrhosis. No focal hepatic lesion or abnormal enhancement identified. There is a large recanalized left paraumbilical vein. The portal vein is patent. There are multiple calcified gallstones. There is new diffuse gallbladder wall thickening with pericholecystic fluid inferiorly, highly suspicious for acute cholecystitis. No significant biliary dilatation demonstrated. Pancreas: Unremarkable. No pancreatic ductal dilatation or surrounding inflammatory changes. Spleen: Normal in size without focal abnormality. Adrenals/Urinary Tract: Both adrenal glands appear normal. There are tiny low-density renal lesions which are likely cysts. No evidence of urinary tract calculus or hydronephrosis. The bladder appears normal. Stomach/Bowel: The stomach and small bowel demonstrate no significant findings. There is mild wall thickening throughout the proximal colon, likely related to the patient's underlying liver disease. No focal bowel wall thickening, significant distention or surrounding inflammation identified. Mild sigmoid diverticulosis. Vascular/Lymphatic: Mildly prominent lymph nodes in the porta hepatis are stable. There is moderate diffuse aortic and branch vessel atherosclerosis. As above, there is a recanalized paraumbilical vein with multiple large venous collaterals in the anterior  abdominal wall and distal esophageal varices. No acute vascular findings are seen. Reproductive: There stable asymmetric calcifications within the left prostate lobe. The seminal vesicles appear normal. Other: There is a small amount of ascites around the liver and in the pelvis. There is mild diffuse mesenteric edema. No focal extraluminal fluid collections are seen. Musculoskeletal: No acute or significant osseous findings. Lower lumbar spondylosis, similar to previous examination. IMPRESSION: 1. New extensive gallbladder wall thickening and pericholecystic fluid highly suspicious for acute cholecystitis. There are multiple underlying calcified gallstones. No significant biliary dilatation. 2. Cirrhosis and portal hypertension with multiple varices, similar to previous study. 3. Ascites and diffuse mesenteric edema. 4. No acute vascular findings are identified. There is no evidence of acute pulmonary embolism. Aortic Atherosclerosis (ICD10-I70.0). 5. Preliminary results have been discussed with Hunter. Ellender Carter. Electronically Signed   By: Richardean Sale M.D.   On: 08/30/2018 10:53   Ir Perc Cholecystostomy  Result Date: 08/30/2018 INDICATION: 67 year old with acute cholecystitis, sepsis and end-stage liver disease. Patient is not a surgical candidate due to his liver disease. Plan for percutaneous cholecystostomy tube placement. Patient was given FFP prior to the procedure. EXAM: IMAGE GUIDED PERCUTANEOUS CHOLECYSTOSTOMY TUBE PLACEMENT ULTRASOUND-GUIDED PARACENTESIS MEDICATIONS: Patient received IV antibiotics in the emergency department prior to the procedure. ANESTHESIA/SEDATION: Moderate (conscious) sedation was employed during this procedure. A total of Versed 1.5 mg and Fentanyl 75 mcg was administered intravenously. Moderate Sedation Time: 13 minutes. The patient's level of consciousness and vital signs were monitored continuously by radiology nursing throughout the procedure under my direct supervision.  FLUOROSCOPY TIME:  Fluoroscopy Time: 42 seconds, 9 mGy COMPLICATIONS: None immediate. PROCEDURE: Informed written consent was obtained from the patient after a thorough discussion of the procedural risks, benefits and alternatives. All questions were addressed. A timeout was performed prior to the initiation of the procedure. Patient was placed supine on the interventional table. Gallbladder and liver was evaluated with ultrasound. Right abdomen was prepped and draped in sterile fashion. Maximal barrier sterile technique was utilized including caps, mask, sterile gowns, sterile gloves, sterile drape, hand hygiene and skin antiseptic. Skin was anesthetized with 1% lidocaine. Due to the perihepatic ascites, a Safe-T-Centesis catheter was directed in the perihepatic space with ultrasound guidance. 300 mL of cloudy amber colored fluid was removed. Attention was directed to the cholecystostomy tube placement. After the paracentesis was complete, the skin was anesthetized with 1% lidocaine near the gallbladder. 21  gauge needle was directed into the gallbladder fundus with ultrasound guidance. 0.018 wire was advanced into the gallbladder. The tract was dilated with an Accustick set. A J wire was advanced into the gallbladder and the tract was dilated to accommodate a 10.2 [place] multipurpose drain. 90 mL of clay colored purulent looking fluid was removed from the gallbladder. The gallbladder was decompressed based on ultrasound at the end of the procedure. Catheter was sutured to skin and attached to a gravity bag. Paracentesis catheter was also removed at the end of the procedure. FINDINGS: Initial ultrasound images demonstrate small to moderate amount of perihepatic ascites and fluid around the gallbladder. Therefore, a paracentesis was performed and majority of the perihepatic fluid was removed. 300 mL of cloudy amber colored fluid removed from the perihepatic space. The fluid appearance was concerning for infection.  Gallbladder wall was markedly thickened. Multiple echogenic stones. Catheter was successfully advanced into the gallbladder and the gallbladder was decompressed. 90 mL of clay colored purulent looking fluid was removed from the gallbladder. Gallbladder was decompressed at end of the procedure. IMPRESSION: Successful image guided percutaneous cholecystostomy tube placement. In addition, 300 mL perihepatic ascites was removed during the procedure. Appearance of the peritoneal fluid is concerning for infection and underlying gallbladder perforation. Gallbladder fluid and peritoneal fluid was sent for culture. Electronically Signed   By: Markus Daft M.D.   On: 08/30/2018 17:05   Dg Chest Portable 1 View  Result Date: 08/30/2018 CLINICAL DATA:  Abdominal pain with nausea, vomiting and cough for 3 weeks. History congestive heart failure and pacemaker. EXAM: PORTABLE CHEST 1 VIEW COMPARISON:  03/20/2018 and 09/07/2017 radiographs. FINDINGS: 0732 hour. The left subclavian pacemaker leads appear unchanged within the right atrium and right ventricle. The heart size is stable status post median sternotomy and CABG. The lungs remain clear. There is no pleural effusion or pneumothorax. The bones appear unchanged. Telemetry leads overlie the chest. IMPRESSION: Stable chest post CABG.  No active cardiopulmonary process. Electronically Signed   By: Richardean Sale M.D.   On: 08/30/2018 08:07   Dg Abd Portable 1v  Result Date: 09/01/2018 CLINICAL DATA:  Three-week history of lower abdominal pain. The patient reports the abdomen is distended and firm on the lower left. No bowel habit changes or nausea or vomiting. History of gastric ulcer disease with bleeding, percutaneous cholecystostomy on August 30, 2018 and paracentesis on the same day. EXAM: PORTABLE ABDOMEN - 1 VIEW COMPARISON:  Abdominal radiographs of August 31, 2018. FINDINGS: The bowel gas pattern is within the limits of normal. Multiple gallstones are noted  in the right upper quadrant with a cholecystostomy tube present and stable. No bony abnormalities are observed. IMPRESSION: Fairly normal appearing bowel gas pattern. Known gallstones and cholecystostomy tube. No significant change since the previous study. Electronically Signed   By: David  Martinique M.D.   On: 09/01/2018 09:10   Ir Paracentesis  Result Date: 08/31/2018 INDICATION: 67 year old with acute cholecystitis, sepsis and end-stage liver disease. Patient is not a surgical candidate due to his liver disease. Plan for percutaneous cholecystostomy tube placement. Patient was given FFP prior to the procedure. EXAM: IMAGE GUIDED PERCUTANEOUS CHOLECYSTOSTOMY TUBE PLACEMENT ULTRASOUND-GUIDED PARACENTESIS MEDICATIONS: Patient received IV antibiotics in the emergency department prior to the procedure. ANESTHESIA/SEDATION: Moderate (conscious) sedation was employed during this procedure. A total of Versed 1.5 mg and Fentanyl 75 mcg was administered intravenously. Moderate Sedation Time: 13 minutes. The patient's level of consciousness and vital signs were monitored continuously by radiology nursing throughout the  procedure under my direct supervision. FLUOROSCOPY TIME:  Fluoroscopy Time: 42 seconds, 9 mGy COMPLICATIONS: None immediate. PROCEDURE: Informed written consent was obtained from the patient after a thorough discussion of the procedural risks, benefits and alternatives. All questions were addressed. A timeout was performed prior to the initiation of the procedure. Patient was placed supine on the interventional table. Gallbladder and liver was evaluated with ultrasound. Right abdomen was prepped and draped in sterile fashion. Maximal barrier sterile technique was utilized including caps, mask, sterile gowns, sterile gloves, sterile drape, hand hygiene and skin antiseptic. Skin was anesthetized with 1% lidocaine. Due to the perihepatic ascites, a Safe-T-Centesis catheter was directed in the perihepatic  space with ultrasound guidance. 300 mL of cloudy amber colored fluid was removed. Attention was directed to the cholecystostomy tube placement. After the paracentesis was complete, the skin was anesthetized with 1% lidocaine near the gallbladder. 21 gauge needle was directed into the gallbladder fundus with ultrasound guidance. 0.018 wire was advanced into the gallbladder. The tract was dilated with an Accustick set. A J wire was advanced into the gallbladder and the tract was dilated to accommodate a 10.2 [place] multipurpose drain. 90 mL of clay colored purulent looking fluid was removed from the gallbladder. The gallbladder was decompressed based on ultrasound at the end of the procedure. Catheter was sutured to skin and attached to a gravity bag. Paracentesis catheter was also removed at the end of the procedure. FINDINGS: Initial ultrasound images demonstrate small to moderate amount of perihepatic ascites and fluid around the gallbladder. Therefore, a paracentesis was performed and majority of the perihepatic fluid was removed. 300 mL of cloudy amber colored fluid removed from the perihepatic space. The fluid appearance was concerning for infection. Gallbladder wall was markedly thickened. Multiple echogenic stones. Catheter was successfully advanced into the gallbladder and the gallbladder was decompressed. 90 mL of clay colored purulent looking fluid was removed from the gallbladder. Gallbladder was decompressed at end of the procedure. IMPRESSION: Successful image guided percutaneous cholecystostomy tube placement. In addition, 300 mL perihepatic ascites was removed during the procedure. Appearance of the peritoneal fluid is concerning for infection and underlying gallbladder perforation. Gallbladder fluid and peritoneal fluid was sent for culture. Electronically Signed   By: Markus Daft M.D.   On: 08/30/2018 17:05    YHC:WCBJSE flutter, RBBB (personally reviewed)  TELEMETRY: SR -> atrial flutter with  RVR (personally reviewed)  DEVICE HISTORY: MDT dual chamber pacemaker implanted for Mobitz II heart block  Assessment/Plan: 1.  Atrial flutter In the setting of sepsis with RVR Will give 1 dose of IV lopressor and then start amiodarone IV for rhythm control If still in atrial flutter tomorrow, would cardiovert (episode began this morning at 3AM) - would need to determine short term plan for anticoagulation. NPO after midnight tonight.  He is not felt to be a candidate for long term anticoagulation with cirrhosis, coagulopathy, thrombocytopenia, and prior GI bleeding.  CHADS2VASC is 3  2.  Mobitz II Normal device function See paper chart for interrogation  3.  CAD s/p CABG No recent ischemic symptoms No antiplatelets 2/2 prior GI bleeding  4.  Sepsis/cholecystitis Per primary team    For questions or updates, please contact [place] Please consult [email or web address] for contact info under Cardiology/STEMI.  Signed, Chanetta Marshall, NP 09/01/2018 9:54 AM   I have seen and examined the patient along with Chanetta Marshall, NP.  I have reviewed the chart, notes and new data.  I agree with NP's note.  Key  new complaints: mostly complains of abdominal symptoms Key examination changes: tense, distended abdomen. Rapid irregular rhythm. No overt CHF so far. Key new findings / data: pacemaker interrogation demonstrates atrial fibrillation/atypical flutter with variable AV block.  PLAN: Amiodarone IV and metoprolol. Arrhythmia is likely driven by hyperadrenergic state due to intra-abdominal infection.  Sanda Klein, MD, [place] [phone] 09/01/2018, 1:10 PM

## 2018-09-02 ENCOUNTER — Inpatient Hospital Stay (HOSPITAL_COMMUNITY): Payer: Medicare Other

## 2018-09-02 ENCOUNTER — Other Ambulatory Visit: Payer: Self-pay

## 2018-09-02 ENCOUNTER — Encounter (HOSPITAL_COMMUNITY): Payer: Self-pay | Admitting: Radiology

## 2018-09-02 ENCOUNTER — Encounter (HOSPITAL_COMMUNITY)
Admission: EM | Disposition: A | Discharge status: Skilled Nursing Facility | Payer: Self-pay | Source: Home / Self Care | Attending: Family Medicine

## 2018-09-02 ENCOUNTER — Encounter (HOSPITAL_COMMUNITY): Payer: Self-pay | Admitting: Anesthesiology

## 2018-09-02 DIAGNOSIS — I5032 Chronic diastolic (congestive) heart failure: Secondary | ICD-10-CM

## 2018-09-02 DIAGNOSIS — I361 Nonrheumatic tricuspid (valve) insufficiency: Secondary | ICD-10-CM

## 2018-09-02 DIAGNOSIS — B182 Chronic viral hepatitis C: Secondary | ICD-10-CM

## 2018-09-02 DIAGNOSIS — R140 Abdominal distension (gaseous): Secondary | ICD-10-CM

## 2018-09-02 DIAGNOSIS — D696 Thrombocytopenia, unspecified: Secondary | ICD-10-CM

## 2018-09-02 DIAGNOSIS — D689 Coagulation defect, unspecified: Secondary | ICD-10-CM

## 2018-09-02 DIAGNOSIS — A409 Streptococcal sepsis, unspecified: Secondary | ICD-10-CM

## 2018-09-02 DIAGNOSIS — I25810 Atherosclerosis of coronary artery bypass graft(s) without angina pectoris: Secondary | ICD-10-CM

## 2018-09-02 DIAGNOSIS — I340 Nonrheumatic mitral (valve) insufficiency: Secondary | ICD-10-CM

## 2018-09-02 HISTORY — PX: IR PARACENTESIS: IMG2679

## 2018-09-02 LAB — COMPREHENSIVE METABOLIC PANEL WITH GFR
ALT: 15 U/L (ref 0–44)
AST: 36 U/L (ref 15–41)
Albumin: 1.7 g/dL — ABNORMAL LOW (ref 3.5–5.0)
Alkaline Phosphatase: 82 U/L (ref 38–126)
Anion gap: 7 (ref 5–15)
BUN: 10 mg/dL (ref 8–23)
CO2: 23 mmol/L (ref 22–32)
Calcium: 7.8 mg/dL — ABNORMAL LOW (ref 8.9–10.3)
Chloride: 98 mmol/L (ref 98–111)
Creatinine, Ser: 0.91 mg/dL (ref 0.61–1.24)
GFR calc Af Amer: >60 mL/min (ref >60)
GFR calc non Af Amer: >60 mL/min (ref >60)
Glucose, Bld: 95 mg/dL (ref 70–99)
Potassium: 3.9 mmol/L (ref 3.5–5.1)
Sodium: 128 mmol/L — ABNORMAL LOW (ref 135–145)
Total Bilirubin: 3 mg/dL — ABNORMAL HIGH (ref 0.3–1.2)
Total Protein: 7.7 g/dL (ref 6.5–8.1)

## 2018-09-02 LAB — CBC
HCT: 37.5 % — ABNORMAL LOW (ref 39.0–52.0)
Hemoglobin: 12.1 g/dL — ABNORMAL LOW (ref 13.0–17.0)
MCH: 31.1 pg (ref 26.0–34.0)
MCHC: 32.3 g/dL (ref 30.0–36.0)
MCV: 96.4 fL (ref 80.0–100.0)
Platelets: 61 K/uL — ABNORMAL LOW (ref 150–400)
RBC: 3.89 MIL/uL — ABNORMAL LOW (ref 4.22–5.81)
RDW: 13.8 % (ref 11.5–15.5)
WBC: 9.4 K/uL (ref 4.0–10.5)
nRBC: 0 % (ref 0.0–0.2)

## 2018-09-02 LAB — BODY FLUID CELL COUNT WITH DIFFERENTIAL
Eos, Fluid: 1 %
Lymphs, Fluid: 7 %
Monocyte-Macrophage-Serous Fluid: 15 % — ABNORMAL LOW (ref 50–90)
Neutrophil Count, Fluid: 77 % — ABNORMAL HIGH (ref 0–25)
Total Nucleated Cell Count, Fluid: 16600 uL — ABNORMAL HIGH (ref 0–1000)

## 2018-09-02 LAB — GRAM STAIN

## 2018-09-02 LAB — ECHOCARDIOGRAM COMPLETE
Height: 66 in
Weight: 3407.43 [oz_av]

## 2018-09-02 LAB — PROTEIN, PLEURAL OR PERITONEAL FLUID: TOTAL PROTEIN, FLUID: 3.2 g/dL

## 2018-09-02 LAB — LACTATE DEHYDROGENASE, PLEURAL OR PERITONEAL FLUID: LD, Fluid: 213 U/L — ABNORMAL HIGH (ref 3–23)

## 2018-09-02 SURGERY — CARDIOVERSION
Anesthesia: Monitor Anesthesia Care

## 2018-09-02 MED ORDER — TORSEMIDE 20 MG PO TABS
40.0000 mg | ORAL_TABLET | Freq: Once | ORAL | Status: AC
  Administered 2018-09-02: 40 mg via ORAL
  Filled 2018-09-02: qty 2

## 2018-09-02 MED ORDER — METOPROLOL TARTRATE 5 MG/5ML IV SOLN
5.0000 mg | Freq: Once | INTRAVENOUS | Status: AC
  Administered 2018-09-02: 5 mg via INTRAVENOUS
  Filled 2018-09-02: qty 5

## 2018-09-02 MED ORDER — LIDOCAINE HCL 1 % IJ SOLN
INTRAMUSCULAR | Status: AC
  Filled 2018-09-02: qty 20

## 2018-09-02 MED ORDER — LIDOCAINE HCL (PF) 1 % IJ SOLN
INTRAMUSCULAR | Status: AC | PRN
  Administered 2018-09-02: 10 mL

## 2018-09-02 NOTE — Progress Notes (Signed)
  Echocardiogram 2D Echocardiogram has been performed.  Darlina Sicilian M 09/02/2018, 2:18 PM

## 2018-09-02 NOTE — Progress Notes (Addendum)
Called by nurse for return of rapid AF. Was holding sinus earlier today then back in AF with HR 130s-140s. BP 110/88. On IV amio currently. D/w Dr. Sallyanne Kuster - since metoprolol worked well for patient yesterday will try 5mg  IV x1 now and continue IV amio as ordered. We suspect atrial arrhythmias will continue to be an issue while he recovers from acute illnesses. Dillon Livermore PA-C

## 2018-09-02 NOTE — Procedures (Addendum)
PROCEDURE SUMMARY:  Successful US guided paracentesis from LLQ.  Yielded 470 mL of cloudy amber/red fluid.  No immediate complications.  Pt tolerated well.   Specimen was sent for labs.   RUQ perc chole drain intact. Still with mix of purulent bilious output  Trisa Cranor PA-C 09/02/2018 11:22 AM

## 2018-09-02 NOTE — Care Management Important Message (Signed)
Important Message  Patient Details  Name: Hunter Carter MRN: [id number] Date of Birth: 09/17/1951   Medicare Important Message Given:  Yes    Janah Mcculloh Montine Circle 09/02/2018, 2:19 PM

## 2018-09-02 NOTE — Progress Notes (Signed)
Progress Note  Patient Name: Hunter Carter Date of Encounter: 09/02/2018  Primary Cardiologist:  Philena Obey  Subjective   Feeling much better, breathing easier following repeat paracentesis. Rhythm converted from atrial fibrillation to paced rhythm around 5 PM yesterday, but he is back in atrial fibrillation rapid ventricular response as of approximately 11 AM, around the time of his paracentesis.  Remains in RVR, but rate is slower (110s-120s).  Denies dyspnea or chest pain.  Inpatient Medications    Scheduled Meds: . docusate sodium  100 mg Oral BID  . lidocaine      . polyethylene glycol  17 g Oral Daily  . torsemide  40 mg Oral Once   Continuous Infusions: . sodium chloride 250 mL (08/30/18 1239)  . amiodarone 30 mg/hr (09/02/18 1143)  . ceFEPime (MAXIPIME) IV 1 g (09/02/18 0506)   PRN Meds: albuterol, benzonatate, lip balm, morphine injection   Vital Signs    Vitals:   09/01/18 2042 09/01/18 2242 09/02/18 0242 09/02/18 0419  BP: 122/64 130/84 117/73   Pulse: 72 88    Resp: 12 17 20    Temp:    98.2 F (36.8 C)  TempSrc:    Oral  SpO2: 96% 97%    Weight:      Height:        Intake/Output Summary (Last 24 hours) at 09/02/2018 1218 Last data filed at 09/02/2018 1115 Gross per 24 hour  Intake 650.04 ml  Output 1440 ml  Net -789.96 ml   Filed Weights   08/30/18 0701  Weight: 96.6 kg    Telemetry    Atrial fibrillation with rapid ventricular response since approximately 1100 hrs.- Personally Reviewed  ECG    No new tracing - Personally Reviewed  Physical Exam  Looks reasonably comfortable lying almost fully supine in bed GEN: No acute distress.   Neck: No JVD Cardiac:  Irregular, tachycardia, widely split second heart sound, no murmurs, rubs, or gallops.  Respiratory: Clear to auscultation bilaterally. GI: Softer, but still distended and slightly tender abdomen MS: No edema; No deformity. Neuro:  Nonfocal  Psych: Normal affect   Labs      Chemistry Recent Labs  Lab 08/31/18 1141 09/01/18 0602 09/01/18 0928 09/02/18 0946  NA 129* 131* 130* 128*  K 4.5 4.6 4.4 3.9  CL 102 102 102 98  CO2 22 22 22 23   GLUCOSE 146* 93 102* 95  BUN 17 14 14 10   CREATININE 1.26* 1.03 1.08 0.91  CALCIUM 7.8* 7.9* 7.9* 7.8*  PROT 7.3 8.3*  --  7.7  ALBUMIN 1.5* 1.7*  --  1.7*  AST 33 32  --  36  ALT 16 16  --  15  ALKPHOS 71 80  --  82  BILITOT 4.7* 4.5*  --  3.0*  GFRNONAA 58* >60 >60 >60  GFRAA >60 >60 >60 >60  ANIONGAP 5 7 6 7      Hematology Recent Labs  Lab 08/31/18 0429 09/01/18 0602 09/02/18 0946  WBC 11.4* 11.2* 9.4  RBC 3.64* 4.11* 3.89*  HGB 11.9* 12.8* 12.1*  HCT 35.7* 40.9 37.5*  MCV 98.1 99.5 96.4  MCH 32.7 31.1 31.1  MCHC 33.3 31.3 32.3  RDW 14.0 14.1 13.8  PLT 57* 58* 61*    Cardiac Enzymes Recent Labs  Lab 08/30/18 0705  TROPONINI <0.03   No results for input(s): TROPIPOC in the last 168 hours.   BNP Recent Labs  Lab 08/30/18 0705  BNP 133.0*     DDimer No  results for input(s): DDIMER in the last 168 hours.   Radiology    Ct Abdomen Pelvis W Contrast  Result Date: 09/01/2018 CLINICAL DATA:  67 year old male with abdominal stenting following drain placement. Cannot tolerate p.o. Subsequent encounter. EXAM: CT ABDOMEN AND PELVIS WITH CONTRAST TECHNIQUE: Multidetector CT imaging of the abdomen and pelvis was performed using the standard protocol following bolus administration of intravenous contrast. CONTRAST:  132mL OMNIPAQUE IOHEXOL 300 MG/ML  SOLN COMPARISON:  08/30/2018 CT. FINDINGS: Lower chest: Slight increase in size of small bilateral pleural effusions and basilar atelectasis. Pacemaker in place. Heart slightly enlarged. Coronary artery calcifications. Hepatobiliary: Cirrhotic liver. No dominant focal liver lesion. Recanalization umbilical vein. Portal vein remains patent. Percutaneous cholecystostomy has been performed in the interim. Decrease in size of inflamed gallbladder (which  contains multiple stones). Decrease in size, although incomplete resolution, of complex gallbladder wall thickening. There remains mild inflammation of the superior margin of the adjacent right colon although mass effect has decreased in the interim. Inflammatory process approaches but does not compress the adjacent duodenum. Pancreas: No worrisome pancreatic mass. Spleen: Spleen of normal size without focal lesion. Adrenals/Urinary Tract: No obstructing stone or hydronephrosis. Tiny renal low-density structures too small to characterize statistically likely cysts. No adrenal mass. Noncontrast filled imaging urinary bladder without abnormality. Stomach/Bowel: Distal esophagus narrowing by prominent varix. Scattered colonic diverticula. Evaluation for detection of primary stomach/bowel inflammatory process limited by ascites and under distension. Vascular/Lymphatic: Atherosclerotic changes aorta and aortic branch vessels. No abdominal aortic aneurysm. No large vessel occlusion. Ectatic iliac arteries with right common iliac artery measuring 1.9 cm and left common iliac artery 1.8 cm. Upper abdominal adenopathy greatest porta hepatis and peripancreatic region may be reactive in origin. Reproductive: Slightly lobulated calcified prostate gland impresses upon the bladder base. Other: Ascites minimally changed from prior exam. No free intraperitoneal air. Third spacing of fluid. Musculoskeletal: Degenerative changes lower lumbar spine most notable L5-S1. Bilateral hip joint degenerative changes. IMPRESSION: 1. Percutaneous cholecystostomy has been performed in the interim. Decrease in size of inflamed gallbladder (which contains multiple stones). Decrease in size, although incomplete resolution, of complex gallbladder wall thickening. There remains mild inflammation of the superior margin of the adjacent right colon although mass effect has decreased in the interim. Inflammatory process approaches but does not compress  the adjacent duodenum. 2. Slight increase in small pleural effusions and basilar atelectasis. 3. Ascites without significant change.  Third spacing of fluid. 4. Distal esophagus narrowing by prominent varix. 5. Scattered colonic diverticula. Evaluation for detection of primary stomach/bowel inflammatory process limited by ascites and under distension. 6. Cirrhosis with portal hypertension.  Surrounding varices. 7. Upper abdominal adenopathy may be reactive in origin. 8.  Aortic Atherosclerosis (ICD10-I70.0). 9. Slightly lobulated calcified prostate gland with mild impression upon the bladder base. Electronically Signed   By: Genia Del M.D.   On: 09/01/2018 16:33   Dg Chest Port 1 View  Result Date: 09/02/2018 CLINICAL DATA:  Cough, shortness of breath, recent kidney surgery, history coronary artery disease, CHF, cirrhosis, ethanol abuse, former smoker EXAM: PORTABLE CHEST 1 VIEW COMPARISON:  Portable exam 0652 hours compared to 08/30/2018 FINDINGS: LEFT subclavian AICD leads project at over RIGHT atrium and RIGHT ventricle. Enlargement of cardiac silhouette post CABG. Mediastinal contours and pulmonary vascularity normal. Bibasilar atelectasis greater on LEFT with small LEFT pleural effusion. Upper lungs clear. No pneumothorax. IMPRESSION: Bibasilar atelectasis LEFT greater than RIGHT with small LEFT pleural effusion. Enlargement of cardiac silhouette post CABG and ICD. Electronically Signed  By: Lavonia Dana M.D.   On: 09/02/2018 09:06   Dg Abd Portable 1v  Result Date: 09/01/2018 CLINICAL DATA:  Three-week history of lower abdominal pain. The patient reports the abdomen is distended and firm on the lower left. No bowel habit changes or nausea or vomiting. History of gastric ulcer disease with bleeding, percutaneous cholecystostomy on August 30, 2018 and paracentesis on the same day. EXAM: PORTABLE ABDOMEN - 1 VIEW COMPARISON:  Abdominal radiographs of August 31, 2018. FINDINGS: The bowel gas  pattern is within the limits of normal. Multiple gallstones are noted in the right upper quadrant with a cholecystostomy tube present and stable. No bony abnormalities are observed. IMPRESSION: Fairly normal appearing bowel gas pattern. Known gallstones and cholecystostomy tube. No significant change since the previous study. Electronically Signed   By: David  Martinique M.D.   On: 09/01/2018 09:10   Ir Paracentesis  Result Date: 09/02/2018 INDICATION: Patient with acute cholecystitis status post percutaneous cholecystostomy tube. Abdominal distention and discomfort. Underlying history of cirrhosis. Ascites. Request for diagnostic and therapeutic paracentesis. EXAM: ULTRASOUND GUIDED LEFT LOWER QUADRANT PARACENTESIS MEDICATIONS: None. COMPLICATIONS: None immediate. PROCEDURE: Informed written consent was obtained from the patient after a discussion of the risks, benefits and alternatives to treatment. A timeout was performed prior to the initiation of the procedure. Initial ultrasound scanning demonstrates a small to moderate amount of ascites within the left lower abdominal quadrant. The left lower abdomen was prepped and draped in the usual sterile fashion. 1% lidocaine with epinephrine was used for local anesthesia. Following this, a 19 gauge, 7-cm, Yueh catheter was introduced. An ultrasound image was saved for documentation purposes. The paracentesis was performed. The catheter was removed and a dressing was applied. The patient tolerated the procedure well without immediate post procedural complication. FINDINGS: A total of approximately 470 mL of cloudy, amber/red fluid was removed. Samples were sent to the laboratory as requested by the clinical team. IMPRESSION: Successful ultrasound-guided paracentesis yielding 470 mL of peritoneal fluid. Read by: Ascencion Dike PA-C Electronically Signed   By: Aletta Edouard M.D.   On: 09/02/2018 11:24    Cardiac Studies   Comprehensive pacemaker check yesterday  shows normal device function, atrial fibrillation rapid ventricular response  He has not had a recent echocardiogram.  Patient Profile     67 y.o. male with history of CAD and previous CABG, chronic diastolic heart failure, history of second-degree atrioventricular block Mobitz type II and dual-chamber permanent pacemaker, developing atrial flutter/atrial fibrillation with rapid ventricular response in the setting of sepsis, recent cholecystitis with biliary drain, acute peritonitis, cirrhosis ( post viral hep C with virological cure, complicated by history of hepatic encephalopathy and coagulopathy and thrombocytopenia).  Assessment & Plan    1. AFib w RVR: Continue intravenous amiodarone for now, switch to oral amiodarone in another 24 hours if back in normal rhythm.  Plan i.v./p.o. amiodarone until the acute illness resolves, but this would not be a great drug for him to take long-term due to chronic liver disease.  Prior to his acute infectious/surgical illness rate control during episodes of atrial fibrillation was good on metoprolol therapy.   He is spontaneously anticoagulated (INR 2.14, worse than his baseline around 1.7) and is also moderately thrombocytopenic (platelets 60 K).  He might need further invasive procedures.  He has a previous history of bleeding gastric ulcer requiring transfusion. 2. CHF, chronic: Preserved LVEF, but last assessment was in 2010.  Hard to assess volume status clinically, but does not appear to  be in overt left heart failure.  Previously estimated "dry weight" on his home scales was 216 pounds (in our office 222 pounds), he was 213 pounds on admission and has not been weight since.  He received 1 dose of intravenous furosemide on November 18.  We will start daily weights.  We will order an echocardiogram 3. CAD s/p CABG: He did not have angina even with extreme tachycardia.  He has a known inferior wall scar without ischemia by his most recent nuclear perfusion  study (known occlusion of the SVG bypass to the OM). 4. Cirrhosis: With coagulopathy and moderate thrombocytopenia, thankfully without evidence of encephalopathy at this time.  He has ascites with secondary peritonitis. 5.  History of second-degree AV block with normally functioning dual-chamber permanent pacemaker: Comprehensive pacemaker check yesterday.  Normal device function.   For questions or updates, please contact [place] Please consult [email or web address] for contact info under        Signed, Sanda Klein, MD  09/02/2018, 12:18 PM

## 2018-09-02 NOTE — Progress Notes (Signed)
Family Medicine Teaching Service Daily Progress Note Intern Pager: [phone]  Patient name: Hunter Carter Medical record number: [id number] Date of birth: 05/28/1951 Age: 67 y.o. Gender: male  Primary Care Provider: Nuala Alpha, DO Consultants: Surgery, IR Code Status: DNR  Pt Overview and Major Events to Date:  11/16 admitted with sepsis 2/2 cholecystitis, perc cholecystostomy tube placed by IR 11/17continued abd pain  Assessment and Plan: Hunter Carter a 67 y.o.male septic with acute cholecystitis. PMH is significant forHTN, CAD s/p CABG in 2004 with pacemaker in place, paroxysmal atrial tachycardia, liver cirrhosis 2/2 chronic alcohol abuse and treated Hep C with previous hepatic encephalopathy, chronic diastolic CHF, and paroxysmal atrial fibrillation.   Sepsis 2/2 acute cholecystitis:s/p perc tube 11/16, WBC 12.5 > 11.2>9.4 on 11/19. S/p 2U FFP prior to perc tube.Bilious fluid culture growing moderate Enterobacter resistant to Zosyn, but sensitive to cefepime. CT Abdomen 11/18 shows ascites causing distention and scattered colonic diverticula without focal source of infection. Tmax 98.6*F, Vitals stable.  Paracentesis was performed 11/19 producing cloudy amber-colored fluid with 16,000 white blood cells, 77% neutrophils, lactate dehydrogenase of 213, without organisms seen on Gram stain. -IR Consulted - percutaneous cholecystostomy performed 11/17. Paracentesis 11/19. -Vitalsq4 -Cont IV Cefepime, d/c Vancomycin, flagyl, & Zosyn. -Zofran PRN nausea -Daily CBC, CMP -IV NS decreased to KVO  Hypotension, improving: 94/52 on admission > 117/73 11/19.  -Monitor daily vitals -Continue holding metoprolol and Lasix  Hyperkalemia, resolved:K 5.4 on admission > 4.4 > 3.9.  -Monitor  Liver cirrhosis 2/2previousalcohol abuse and treated Hepatitis C: MELD-Na score 27, 27-32% 90-day mortality. Poor synthetic function - INR 2.41.  Overall, patient has poor prognosis  and would benefit from palliative consult.  -Palliative Consult when patient more stable -Hold home lasix in setting of AKI and hypotension  -Hold lactulose in setting of NPO status  -Consideration for adding spironolactone outpatient if stable at that time   SOB with dry cough: CXR 11/19 showing L pleural effusion. Also with 1+ bilateral LE edema and crackles on lung exam 11/19. -Monitor symptoms -Tessalon PRN cough -Treatment for gallbladder as above -Albuterol nebulizer every 4 hours as needed for wheezing -IV fluids KVO'ed  AKI, stable:Cr 1.47 > 1.03 > 0.91 11/19. Baseline around 0.9. Likely in the setting of poor perfusion secondary to acute sepsis.  -Avoid nephrotoxic medications as possible  -daily BMP  -mIVF 75 ml/hr   Chronic HFpEF stable: CXR 11/19 showing L pleural effusion. Also with 1+ bilateral LE edema and crackles on lung exam.Given Lasix 40mg  once 11/19. Follows with Dr. Loletha Grayer.  -IV fluids KVO'ed 11/19 -Consider echo in future if medically stable  -Holding home metoprolol and losartan in setting of hypotension  -Holding home statin  Paroxysmal ATand atrial fibwith pacemaker, stable: Follows with Dr. Loletha Grayer outpatient. NSR currently, recent pacemaker interrogation in 07/2018 showing low prevalence (0.7%) of atrial fibrillation. Takes metoprolol daily. Not on anticoagulation, auto anticoagulated due to liver cirrhosis. Was given 1mg  Metoprolol during SVT with HR 160s. -Cardiac monitoring -Amiodarone 150mg  given 11/18  -Hold home metoprolol   CAD s/p CABG in 2004, stable:EKG without ischemic changes, troponin <0.03.  -Hold home statin, not on secondary prevention with aspirin daily given previous GI bleed and severe liver cirrhosis.  FEN/GI:Fluids KVO'ed; clear liquid diet, currently NPO pending paracentesis, colace/miralax VZD:GLOV  Disposition: The patient remains in stepdown.  Plan to move to telemetry or MedSurg pending clinical improvement.  Subjective:   Patient is seen this morning sitting upright in bed.  He reports minimal pain with pain  medication and with sitting still.  He states he continues to feel full in his abdomen, but reports his chest pain with breathing and coughing is improving.  Objective: Temp:  [98.2 F (36.8 C)-98.6 F (37 C)] 98.2 F (36.8 C) (11/19 0419) Pulse Rate:  [72-88] 88 (11/18 2242) Resp:  [12-20] 20 (11/19 0242) BP: (110-130)/(64-84) 117/73 (11/19 0242) SpO2:  [96 %-97 %] 97 % (11/18 2242)  Physical Exam  Constitutional: He is oriented to person, place, and time. He appears well-developed and well-nourished. He appears ill.  Eyes: Scleral icterus is present.  Cardiovascular: Normal rate, regular rhythm and normal heart sounds.  Pulmonary/Chest: Effort normal and breath sounds normal.  Abdominal: Bowel sounds are normal. He exhibits distension. There is no rebound.  No rebound tenderness on 11/19 exam compared to day prior  Neurological: He is alert and oriented to person, place, and time.  Skin: Skin is warm and dry. Capillary refill takes less than 2 seconds.  Psychiatric: He has a normal mood and affect.   Laboratory: Recent Labs  Lab 08/30/18 0705 08/31/18 0429 09/01/18 0602  WBC 12.5* 11.4* 11.2*  HGB 14.4 11.9* 12.8*  HCT 44.6 35.7* 40.9  PLT 61* 57* 58*   Recent Labs  Lab 08/31/18 0429  08/31/18 1141 09/01/18 0602 09/01/18 0928  NA 133*   < > 129* 131* 130*  K 5.4*   < > 4.5 4.6 4.4  CL 105   < > 102 102 102  CO2 23   < > 22 22 22   BUN 15   < > 17 14 14   CREATININE 1.31*   < > 1.26* 1.03 1.08  CALCIUM 7.8*   < > 7.8* 7.9* 7.9*  PROT 7.2  --  7.3 8.3*  --   BILITOT 5.1*  --  4.7* 4.5*  --   ALKPHOS 79  --  71 80  --   ALT 15  --  16 16  --   AST 32  --  33 32  --   GLUCOSE 87   < > 146* 93 102*   < > = values in this interval not displayed.   Paracentesis was performed 11/19 producing cloudy amber-colored fluid with 16,000 white blood cells, 77% neutrophils, lactate  dehydrogenase of 213, without organisms seen on Gram stain.  Imaging/Diagnostic Tests: Dg Abd 1 View: 11/17  Cholelithiasis and cholecystostomy tube. No acute abnormality noted.   Ct Angio Chest Pe W Or Wo Contrast: 08/30/2018 1. New extensive gallbladder wall thickening and pericholecystic fluid highly suspicious for acute cholecystitis. There are multiple underlying calcified gallstones. No significant biliary dilatation.  2. Cirrhosis and portal hypertension with multiple varices, similar to previous study.  3. Ascites and diffuse mesenteric edema.  4. No acute vascular findings are identified. There is no evidence of acute pulmonary embolism. Aortic Atherosclerosis (ICD10-I70.0).  5. Preliminary results have been discussed with Dr. Ellender Hose.   Ct Abdomen Pelvis W Contrast: 09/01/2018 1. Percutaneous cholecystostomy has been performed in the interim. Decrease in size of inflamed gallbladder (which contains multiple stones). Decrease in size, although incomplete resolution, of complex gallbladder wall thickening. There remains mild inflammation of the superior margin of the adjacent right colon although mass effect has decreased in the interim. Inflammatory process approaches but does not compress the adjacent duodenum.  2. Slight increase in small pleural effusions and basilar atelectasis.  3. Ascites without significant change.  Third spacing of fluid.  4. Distal esophagus narrowing by prominent varix.  5. Scattered  colonic diverticula. Evaluation for detection of primary stomach/bowel inflammatory process limited by ascites and under distension.  6. Cirrhosis with portal hypertension.  Surrounding varices.  7. Upper abdominal adenopathy may be reactive in origin.  8.  Aortic Atherosclerosis (ICD10-I70.0).  9. Slightly lobulated calcified prostate gland with mild impression upon the bladder base.    Ct Abdomen Pelvis W Contrast 08/30/2018 1. New extensive gallbladder wall thickening and  pericholecystic fluid highly suspicious for acute cholecystitis. There are multiple underlying calcified gallstones. No significant biliary dilatation.  2. Cirrhosis and portal hypertension with multiple varices, similar to previous study.  3. Ascites and diffuse mesenteric edema.  4. No acute vascular findings are identified. There is no evidence of acute pulmonary embolism. Aortic Atherosclerosis (ICD10-I70.0).  5. Preliminary results have been discussed with Dr. Ellender Hose.   Ir Perc Cholecystostomy: 08/30/2018 Successful image guided percutaneous cholecystostomy tube placement. In addition, 300 mL perihepatic ascites was removed during the procedure. Appearance of the peritoneal fluid is concerning for infection and underlying gallbladder perforation. Gallbladder fluid and peritoneal fluid was sent for culture.  Dg Chest Port 1 View: 09/02/2018. Bibasilar atelectasis LEFT greater than RIGHT with small LEFT pleural effusion. Enlargement of cardiac silhouette post CABG and ICD.   Dg Chest Portable 1 View: 08/30/2018, Stable chest post CABG.  No active cardiopulmonary process. Electronically Signed   By: Richardean Sale M.D.   On: 08/30/2018 08:07   Dg Abd Portable 1v 11/18: Fairly normal appearing bowel gas pattern. Known gallstones and cholecystostomy tube. No significant change since the previous study.   Ir Paracentesis: Successful image guided percutaneous cholecystostomy tube placement. In addition, 300 mL perihepatic ascites was removed during the procedure. Appearance of the peritoneal fluid is concerning for infection and underlying gallbladder perforation. Gallbladder fluid and peritoneal fluid was sent for culture.    Daisy Floro, DO 09/02/2018, 9:11 AM PGY-1, [place] Intern pager: [phone], text pages welcome

## 2018-09-03 DIAGNOSIS — I441 Atrioventricular block, second degree: Secondary | ICD-10-CM

## 2018-09-03 DIAGNOSIS — Z7189 Other specified counseling: Secondary | ICD-10-CM

## 2018-09-03 DIAGNOSIS — K7031 Alcoholic cirrhosis of liver with ascites: Secondary | ICD-10-CM

## 2018-09-03 DIAGNOSIS — Z515 Encounter for palliative care: Secondary | ICD-10-CM

## 2018-09-03 LAB — CBC
HEMATOCRIT: 37.3 % — AB (ref 39.0–52.0)
Hemoglobin: 12.5 g/dL — ABNORMAL LOW (ref 13.0–17.0)
MCH: 31.7 pg (ref 26.0–34.0)
MCHC: 33.5 g/dL (ref 30.0–36.0)
MCV: 94.7 fL (ref 80.0–100.0)
NRBC: 0 % (ref 0.0–0.2)
Platelets: 62 10*3/uL — ABNORMAL LOW (ref 150–400)
RBC: 3.94 MIL/uL — ABNORMAL LOW (ref 4.22–5.81)
RDW: 13.7 % (ref 11.5–15.5)
WBC: 8.7 10*3/uL (ref 4.0–10.5)

## 2018-09-03 LAB — BASIC METABOLIC PANEL
ANION GAP: 9 (ref 5–15)
BUN: 12 mg/dL (ref 8–23)
CALCIUM: 7.5 mg/dL — AB (ref 8.9–10.3)
CHLORIDE: 95 mmol/L — AB (ref 98–111)
CO2: 26 mmol/L (ref 22–32)
Creatinine, Ser: 1.15 mg/dL (ref 0.61–1.24)
GFR calc non Af Amer: >60 mL/min (ref >60)
GLUCOSE: 94 mg/dL (ref 70–99)
Potassium: 3.3 mmol/L — ABNORMAL LOW (ref 3.5–5.1)
Sodium: 130 mmol/L — ABNORMAL LOW (ref 135–145)

## 2018-09-03 LAB — PATHOLOGIST SMEAR REVIEW

## 2018-09-03 MED ORDER — AMIODARONE HCL 200 MG PO TABS
400.0000 mg | ORAL_TABLET | Freq: Every day | ORAL | Status: DC
  Administered 2018-09-03 – 2018-09-05 (×3): 400 mg via ORAL
  Filled 2018-09-03 (×3): qty 2

## 2018-09-03 MED ORDER — SPIRONOLACTONE 25 MG PO TABS
100.0000 mg | ORAL_TABLET | Freq: Every day | ORAL | Status: DC
  Administered 2018-09-03 – 2018-09-08 (×6): 100 mg via ORAL
  Filled 2018-09-03 (×6): qty 4

## 2018-09-03 MED ORDER — SULFAMETHOXAZOLE-TRIMETHOPRIM 800-160 MG PO TABS
1.0000 | ORAL_TABLET | Freq: Two times a day (BID) | ORAL | Status: DC
  Administered 2018-09-03 – 2018-09-10 (×15): 1 via ORAL
  Filled 2018-09-03 (×15): qty 1

## 2018-09-03 MED ORDER — METOPROLOL TARTRATE 25 MG PO TABS
25.0000 mg | ORAL_TABLET | Freq: Two times a day (BID) | ORAL | Status: DC
  Administered 2018-09-03 – 2018-09-10 (×14): 25 mg via ORAL
  Filled 2018-09-03 (×15): qty 1

## 2018-09-03 MED ORDER — POTASSIUM CHLORIDE CRYS ER 20 MEQ PO TBCR
40.0000 meq | EXTENDED_RELEASE_TABLET | Freq: Two times a day (BID) | ORAL | Status: AC
  Administered 2018-09-03 (×2): 40 meq via ORAL
  Filled 2018-09-03 (×2): qty 2

## 2018-09-03 MED ORDER — SULFAMETHOXAZOLE-TRIMETHOPRIM 800-160 MG PO TABS: 1.0000 | ORAL_TABLET | Freq: Two times a day (BID) | ORAL | Status: DC

## 2018-09-03 MED ORDER — TORSEMIDE 20 MG PO TABS
40.0000 mg | ORAL_TABLET | Freq: Every day | ORAL | Status: DC
  Administered 2018-09-03 – 2018-09-05 (×3): 40 mg via ORAL
  Filled 2018-09-03 (×3): qty 2

## 2018-09-03 NOTE — Consult Note (Signed)
Consultation Note Date: 09/03/2018   Patient Name: Hunter Carter  DOB: 02-10-1951  MRN: [id number]  Age / Sex: 67 y.o., male  PCP: Hunter Alpha, DO Referring Physician: No att. providers found  Reason for Consultation: Establishing goals of care  HPI/Patient Profile: 67 year old male with known multiple medical comorbidities including liver cirrhosis secondary to chronic alcohol abuse, hepatitis C which is been treated and previous hepatic encephalopathy, chronic diastolic heart failure, paroxysmal atrial fibrillation, CAD with CABG and pacemaker in place who presents with diffuse abdominal pain for the past week.  Recent hospitalization in October with acute cholecystitis and treated with antibiotics.  Clinical Assessment and Goals of Care: Hunter Carter lives alone at his home. He has 5 children and 3 grandchildren. He has 8 siblings. He has been widowed x2, both times because of cancer. He states he is close to his children, but is not close to the rest of his family. He talks a lot about his parents, and how they always saw to it that the children were taken care of, and had what they needed even when they did not. He talks about his faith in [place]. He is retired from [employer] and discusses how he helped fellow church members in their homes.   He states he stopped drinking alcohol and smoking 3 years ago.  He tells me his doctor states his liver is okay as of last visit. He states he wants to live as long as he can to see his grandchildren grow. His appetite has been poor recently, but states he feels better now that he has the drain.   Will return tomorrow for further discussion of [place].     SUMMARY OF RECOMMENDATIONS   Will return tomorrow for further conversation.    Code Status/Advance Care Planning:  DNR    Symptom Management:   Per primary team.    Prognosis:   Poor  longterm  Discharge Planning: To Be Determined      Primary Diagnoses: Present on Admission: **None**   I have reviewed the medical record, interviewed the patient and family, and examined the patient. The following aspects are pertinent.  Past Medical History:  Diagnosis Date  . Acute hepatic encephalopathy 10/10/2015   [name] 10/10/2015  . Arthritis    "joints ache" (10/10/2015)  . Bleeding stomach ulcer    "recently" (10/10/2015)  . CAD (coronary artery disease)   . CHF (congestive heart failure) ([place])   . Cirrhosis ([place])    due to HCV/notes 10/10/2015  . ETOH abuse   . GERD (gastroesophageal reflux disease)   . Heart block AV second degree    permanent pacemaker 02/2004  . Hepatitis C   . Hyperlipidemia   . Inferior MI ([place]) 2005  . Presence of permanent cardiac pacemaker   . RBBB   . Systemic hypertension   . Tubulovillous adenoma of colon   . Ventricular tachycardia (H[facility])    nonsustained   Social History   Socioeconomic History  . Marital status: Widowed    Spouse name:  Not on file  . Number of children: Not on file  . Years of education: Not on file  . Highest education level: Not on file  Occupational History  . Not on file  Social Needs  . Financial resource strain: Not on file  . Food insecurity:    Worry: Not on file    Inability: Not on file  . Transportation needs:    Medical: Not on file    Non-medical: Not on file  Tobacco Use  . Smoking status: Former Smoker    Packs/day: 0.25    Years: 41.00    Pack years: 10.25    Types: Cigarettes    Last attempt to quit: 11/15/2017    Years since quitting: 0.8  . Smokeless tobacco: Never Used  Substance and Sexual Activity  . Alcohol use: Yes    Comment: 10/10/2015 "last drink was 2-3 months ago; h/o abuse"  . Drug use: Yes    Types: Marijuana    Comment: last use 2015  . Sexual activity: Not on file  Lifestyle  . Physical activity:    Days per week: Not on file    Minutes per session: Not  on file  . Stress: Not on file  Relationships  . Social connections:    Talks on phone: Not on file    Gets together: Not on file    Attends religious service: Not on file    Active member of club or organization: Not on file    Attends meetings of clubs or organizations: Not on file    Relationship status: Not on file  Other Topics Concern  . Not on file  Social History Narrative  . Not on file   Family History  Problem Relation Age of Onset  . Heart failure Mother   . Liver disease Father   . Heart failure Sister   . Hypertension Sister   . Hypertension Sister   . Colon cancer Neg Hx    Scheduled Meds: . amiodarone  400 mg Oral Daily  . docusate sodium  100 mg Oral BID  . metoprolol tartrate  25 mg Oral BID  . polyethylene glycol  17 g Oral Daily  . potassium chloride  40 mEq Oral BID  . spironolactone  100 mg Oral Daily  . sulfamethoxazole-trimethoprim  1 tablet Oral Q12H  . torsemide  40 mg Oral Daily   Continuous Infusions: . sodium chloride 250 mL (08/30/18 1239)   PRN Meds:.albuterol, benzonatate, lip balm, morphine injection Medications Prior to Admission:  Prior to Admission medications   Medication Sig Start Date End Date Taking? Authorizing Provider  furosemide (LASIX) 40 MG tablet TAKE 1 TABLET BY MOUTH  DAILY AS NEEDED Patient taking differently: Take 40 mg by mouth daily. Make take an additional 40 mg if needed 07/15/18  Yes Lockamy, Timothy, DO  lactulose (CHRONULAC) 10 GM/15ML solution Take 45 mLs (30 g total) by mouth 2 (two) times daily. Titrate for 3-4 BM's/ day Patient taking differently: Take 30 g by mouth 3 (three) times daily. Titrate for 3-4 BM's/ day 03/22/18  Yes Sheikh, Omair Latif, DO  losartan (COZAAR) 25 MG tablet TAKE 1 TABLET BY MOUTH  DAILY Patient taking differently: Take 25 mg by mouth daily.  04/30/18  Yes Lockamy, Timothy, DO  metoprolol tartrate (LOPRESSOR) 50 MG tablet TAKE 1 TABLET BY MOUTH TWO  TIMES DAILY Patient taking  differently: Take 50 mg by mouth daily.  07/15/18  Yes Hunter Alpha, DO  omeprazole (PRILOSEC) 20  MG capsule TAKE 1 CAPSULE BY MOUTH  DAILY Patient taking differently: Take 20 mg by mouth daily.  07/15/18  Yes Lockamy, Timothy, DO  PROAIR HFA 108 (90 Base) MCG/ACT inhaler USE 2 PUFFS BY MOUTH EVERY  4 HOURS AS NEEDED FOR  WHEEZING OR SHORTNESS OF  BREATH (OR COUGHING) Patient taking differently: Inhale 1-2 puffs into the lungs every 4 (four) hours as needed for wheezing.  06/20/18  Yes Lockamy, Timothy, DO  simvastatin (ZOCOR) 20 MG tablet TAKE 1 TABLET BY MOUTH  DAILY Patient taking differently: Take 20 mg by mouth daily at 6 PM.  07/15/18  Yes Lockamy, Timothy, DO   No Known Allergies Review of Systems  Gastrointestinal: Positive for abdominal pain.    Physical Exam  Constitutional: No distress.  Neurological: He is alert.  Oriented  Skin: Skin is warm and dry.    Vital Signs: BP 120/65   Pulse 77   Temp 98.1 F (36.7 C) (Oral)   Resp 19   Ht 5\' 6"  (1.676 m)   Wt 98.2 kg   SpO2 99%   BMI 34.94 kg/m  Pain Scale: 0-10   Pain Score: 7    SpO2: SpO2: 99 % O2 Device:SpO2: 99 % O2 Flow Rate: .O2 Flow Rate (L/min): 2 L/min  IO: Intake/output summary:   Intake/Output Summary (Last 24 hours) at 09/03/2018 1626 Last data filed at 09/03/2018 0349 Gross per 24 hour  Intake -  Output 400 ml  Net -400 ml    LBM: Last BM Date: 09/03/18 Baseline Weight: Weight: 96.6 kg Most recent weight: Weight: 98.2 kg     Palliative Assessment/Data: 50%     Time In: 3:40 Time Out: 4:35 Time Total: 55 min Greater than 50%  of this time was spent counseling and coordinating care related to the above assessment and plan.  Signed by: Asencion Gowda, NP   Please contact Palliative Medicine Team phone at [phone] for questions and concerns.  For individual provider: See Shea Evans

## 2018-09-03 NOTE — Progress Notes (Signed)
Family Medicine Teaching Service Daily Progress Note Intern Pager: [phone]  Patient name: Hunter Carter Medical record number: [id number] Date of birth: May 19, 1951 Age: 67 y.o. Gender: male  Primary Care Provider: Nuala Alpha, DO Consultants: Surgery, IR Code Status: DNR  Pt Overview and Major Events to Date:  11/16 admitted with sepsis 2/2 cholecystitis, perc cholecystostomy tube placed by IR 11/17continued abd pain  Assessment and Plan: Hunter Carter a 67 y.o.male septic with acute cholecystitis. PMH is significant forHTN, CAD s/p CABG in 2004 with pacemaker in place, paroxysmal atrial tachycardia, liver cirrhosis 2/2 chronic alcohol abuse and treated Hep C with previous hepatic encephalopathy, chronic diastolic CHF, and paroxysmal atrial fibrillation.   Sepsis 2/2 acute cholecystitis, improving:s/p perc tube 11/16, WBC 12.5 > 11.2>9.4>8.7 on 11/20. Bilious/peritoneal fluid culture growing moderate Enterobacter. Tmax 98.1*F, Vitals stable.  Paracentesis was performed 11/19 producing cloudy amber-colored fluid with 16,000 white blood cells, 77% neutrophils, lactate dehydrogenase of 213, without organisms seen on Gram stain. -IR Consulted- Continuing to follow. -Start Bactrim 11/20 -d/c Cefepime, Vancomycin, flagyl, & Zosyn -Zofran PRN nausea -Daily CBC, CMP -IV NS KVO -PT/OT to see 11/21  Paroxysmal ATand atrial fibwith pacemaker,stable: Follows with Dr. Loletha Grayer outpatient. Had A.fib with RVR for ~4 hours 11/20 and overnight. Echo 11/19 shows LV with 65-70% EF and normal wall motion. -Cardiac monitoring -Amiodarone 400mg  PO daily for total of 30 days -Metoprolol 25mg  BID  Hypotension, improving: 94/52 on admission > 118/94 11/20.  -Spironolactone 100mg  daily -Monitor daily vitals -Continue holding metoprolol and Lasix  Hyperkalemia, resolved:K 5.4on admission > 4.4 > 3.9> 3.3 11/20. -Monitor -KDur 55mEq BID  Liver cirrhosis 2/2previousalcohol  abuse and treated Hepatitis C: MELD-Na score 27, 27-32% 90-day mortality. Poor synthetic function - INR 2.41.  Overall, patient has poor prognosis and would benefit from palliative consult.  -Palliative Consult when patient more stable -Spironolactone 100mg  daily  SOB with dry cough:CXR 11/19 showing L pleural effusion. Also with 2+ bilateral LE edema and crackles on lung exam 11/20. -Monitor symptoms -Tessalon PRN cough -Restart Torsemide 40mg  daily -Treatment for gallbladder as above -Albuterol nebulizer every 4 hours as needed for wheezing -IV fluids KVO'ed  AKI, stable:Cr 1.47 >1.03 > 0.91 11/19.Baseline around 0.9. Likely in the setting of poor perfusion secondary to acute sepsis.  -Avoid nephrotoxic medications as possible  -daily BMP  -mIVF 75 ml/hr   Chronic HFpEFstable: CXR 11/19 showing L pleural effusion. Also with 1+ bilateral LE edema and crackles on lung exam.Follows with Dr. Loletha Grayer. Echo 11/19 - EF 65-70% without wall motion abnormality  -IV fluids KVO'ed 11/19 -Metoprolol 25mg  daily -Restart Lasix 40mg  daily 11/20 -Holding home losartan in setting of hypotension  -Holding home statin  CAD s/p CABG in 2004, stable:EKG without ischemic changes, troponin <0.03.  -Hold home statin, not on secondary prevention with aspirin daily given previous GI bleed and severe liver cirrhosis.  FEN/GI:Fluids KVO'ed; Regular diet, currently NPO pending paracentesis, colace/miralax JYN:WGNF  Disposition: The patient was moved to telemetry. Can discharge home once clinically improved.  Subjective:  Patient seen this morning sitting upright in bed. He states he is sore this morning, most likely from the paracentesis, and did not sleep well last night. He is still not eating very much. His vitals are improving in the room. He was informed palliative would be coming by to see him today.  Objective: Temp:  [98.1 F (36.7 C)-98.5 F (36.9 C)] 98.1 F (36.7 C) (11/20  0700) Pulse Rate:  [79-83] 83 (11/20 0700) Resp:  [19-25] 19 (  11/20 0700) BP: (115-128)/(70-94) 118/94 (11/20 0700) SpO2:  [91 %-94 %] 94 % (11/20 0700) Weight:  [98.2 kg] 98.2 kg (11/20 0500) Physical Exam  Constitutional: He is oriented to person, place, and time. He appears well-developed and well-nourished. He appears ill.  Eyes: Scleral icterus is present.  Cardiovascular: Normal rate, regular rhythm and normal heart sounds.  Pulmonary/Chest: Effort normal and breath sounds normal.  Abdominal: Bowel sounds are normal. He exhibits distension. There is no rebound.  No rebound tenderness on 11/19 exam compared to day prior  Musculoskeletal: He exhibits edema (2+ pitting in bilateral lower extremities).  Neurological: He is alert and oriented to person, place, and time.  Skin: Skin is warm and dry. Capillary refill takes less than 2 seconds.  Psychiatric: He has a normal mood and affect.   Laboratory: Recent Labs  Lab 09/01/18 0602 09/02/18 0946 09/03/18 0608  WBC 11.2* 9.4 8.7  HGB 12.8* 12.1* 12.5*  HCT 40.9 37.5* 37.3*  PLT 58* 61* 62*   Recent Labs  Lab 08/31/18 1141 09/01/18 0602 09/01/18 0928 09/02/18 0946 09/03/18 0608  NA 129* 131* 130* 128* 130*  K 4.5 4.6 4.4 3.9 3.3*  CL 102 102 102 98 95*  CO2 22 22 22 23 26   BUN 17 14 14 10 12   CREATININE 1.26* 1.03 1.08 0.91 1.15  CALCIUM 7.8* 7.9* 7.9* 7.8* 7.5*  PROT 7.3 8.3*  --  7.7  --   BILITOT 4.7* 4.5*  --  3.0*  --   ALKPHOS 71 80  --  82  --   ALT 16 16  --  15  --   AST 33 32  --  36  --   GLUCOSE 146* 93 102* 95 94   Imaging/Diagnostic Tests: Dg Abd 1 View: 11/17  Cholelithiasis and cholecystostomy tube. No acute abnormality noted.   Ct Angio Chest Pe W Or Wo Contrast: 08/30/2018 1. New extensive gallbladder wall thickening and pericholecystic fluid highly suspicious for acute cholecystitis. There are multiple underlying calcified gallstones. No significant biliary dilatation.  2. Cirrhosis and portal  hypertension with multiple varices, similar to previous study.  3. Ascites and diffuse mesenteric edema.  4. No acute vascular findings are identified. There is no evidence of acute pulmonary embolism. Aortic Atherosclerosis (ICD10-I70.0).  5. Preliminary results have been discussed with Dr. Ellender Hose.   Ct Abdomen Pelvis W Contrast: 09/01/2018 1. Percutaneous cholecystostomy has been performed in the interim. Decrease in size of inflamed gallbladder (which contains multiple stones). Decrease in size, although incomplete resolution, of complex gallbladder wall thickening. There remains mild inflammation of the superior margin of the adjacent right colon although mass effect has decreased in the interim. Inflammatory process approaches but does not compress the adjacent duodenum.  2. Slight increase in small pleural effusions and basilar atelectasis.  3. Ascites without significant change.  Third spacing of fluid.  4. Distal esophagus narrowing by prominent varix.  5. Scattered colonic diverticula. Evaluation for detection of primary stomach/bowel inflammatory process limited by ascites and under distension.  6. Cirrhosis with portal hypertension.  Surrounding varices.  7. Upper abdominal adenopathy may be reactive in origin.  8.  Aortic Atherosclerosis (ICD10-I70.0).  9. Slightly lobulated calcified prostate gland with mild impression upon the bladder base.    Ct Abdomen Pelvis W Contrast 08/30/2018 1. New extensive gallbladder wall thickening and pericholecystic fluid highly suspicious for acute cholecystitis. There are multiple underlying calcified gallstones. No significant biliary dilatation.  2. Cirrhosis and portal hypertension with multiple varices,  similar to previous study.  3. Ascites and diffuse mesenteric edema.  4. No acute vascular findings are identified. There is no evidence of acute pulmonary embolism. Aortic Atherosclerosis (ICD10-I70.0).  5. Preliminary results have been  discussed with Dr. Ellender Hose.   Ir Perc Cholecystostomy: 08/30/2018 Successful image guided percutaneous cholecystostomy tube placement. In addition, 300 mL perihepatic ascites was removed during the procedure. Appearance of the peritoneal fluid is concerning for infection and underlying gallbladder perforation. Gallbladder fluid and peritoneal fluid was sent for culture.  Dg Chest Port 1 View: 09/02/2018. Bibasilar atelectasis LEFT greater than RIGHT with small LEFT pleural effusion. Enlargement of cardiac silhouette post CABG and ICD.   Dg Chest Portable 1 View: 08/30/2018, Stable chest post CABG.  No active cardiopulmonary process. Electronically Signed   By: Richardean Sale M.D.   On: 08/30/2018 08:07   Dg Abd Portable 1v 11/18: Fairly normal appearing bowel gas pattern. Known gallstones and cholecystostomy tube. No significant change since the previous study.   Ir Paracentesis: Successful image guided percutaneous cholecystostomy tube placement. In addition, 300 mL perihepatic ascites was removed during the procedure. Appearance of the peritoneal fluid is concerning for infection and underlying gallbladder perforation. Gallbladder fluid and peritoneal fluid was sent for culture.    Daisy Floro, DO 09/03/2018, 9:59 AM PGY-1, [place] Intern pager: [phone], text pages welcome

## 2018-09-03 NOTE — Progress Notes (Signed)
Chief Complaint: Patient was seen today for perc chole drain   Supervising Physician: Markus Daft  Patient Status: [facility] - In-pt  Subjective: S/p perc chole. Had paracentesis yesterday Feeling better today. Tolerating some clears. A little sore at drain site  Objective: Physical Exam: BP (!) 118/94 (BP Location: Left Arm)   Pulse 83   Temp 98.1 F (36.7 C) (Oral)   Resp 19   Ht 5\' 6"  (1.676 m)   Wt 98.2 kg   SpO2 94%   BMI 34.94 kg/m  RUQ drain intact. Serosanguinous output    Current Facility-Administered Medications:  .  0.9 %  sodium chloride infusion, 250 mL, Intravenous, Continuous, Sela Hilding, MD, Last Rate: 20 mL/hr at 08/30/18 1239, 250 mL at 08/30/18 1239 .  albuterol (PROVENTIL) (2.5 MG/3ML) 0.083% nebulizer solution 3 mL, 3 mL, Inhalation, Q4H PRN, Sela Hilding, MD, 3 mL at 09/02/18 0830 .  [COMPLETED] amiodarone (NEXTERONE) 1.8 mg/mL load via infusion 150 mg, 150 mg, Intravenous, Once, 150 mg at 09/01/18 1038 **FOLLOWED BY** [EXPIRED] amiodarone (NEXTERONE PREMIX) 360-4.14 MG/200ML-% (1.8 mg/mL) IV infusion, 60 mg/hr, Intravenous, Continuous, Last Rate: 33.3 mL/hr at 09/01/18 1409, 60 mg/hr at 09/01/18 1409 **FOLLOWED BY** amiodarone (NEXTERONE PREMIX) 360-4.14 MG/200ML-% (1.8 mg/mL) IV infusion, 30 mg/hr, Intravenous, Continuous, Seiler, Amber K, NP, Last Rate: 16.67 mL/hr at 09/03/18 0049, 30 mg/hr at 09/03/18 0049 .  benzonatate (TESSALON) capsule 200 mg, 200 mg, Oral, TID PRN, Riccio, Angela C, DO, 200 mg at 09/03/18 0125 .  ceFEPIme (MAXIPIME) 1 g in sodium chloride 0.9 % 100 mL IVPB, 1 g, Intravenous, Q8H, Milus Banister C, DO, Last Rate: 200 mL/hr at 09/03/18 0647, 1 g at 09/03/18 0647 .  docusate sodium (COLACE) capsule 100 mg, 100 mg, Oral, BID, Meuth, Brooke A, PA-C, 100 mg at 09/03/18 0912 .  lip balm (CARMEX) ointment, , Topical, PRN, Bodenheimer, Charles A, NP .  morphine 2 MG/ML injection 2 mg, 2 mg, Intravenous, Q2H PRN, Kathrene Alu, MD, 2 mg at 09/03/18 0911 .  polyethylene glycol (MIRALAX / GLYCOLAX) packet 17 g, 17 g, Oral, Daily, Meuth, Brooke A, PA-C, 17 g at 09/03/18 0912 .  potassium chloride SA (K-DUR,KLOR-CON) CR tablet 40 mEq, 40 mEq, Oral, BID, Kathrene Alu, MD  Labs: CBC Recent Labs    09/02/18 0946 09/03/18 0608  WBC 9.4 8.7  HGB 12.1* 12.5*  HCT 37.5* 37.3*  PLT 61* 62*   BMET Recent Labs    09/02/18 0946 09/03/18 0608  NA 128* 130*  K 3.9 3.3*  CL 98 95*  CO2 23 26  GLUCOSE 95 94  BUN 10 12  CREATININE 0.91 1.15  CALCIUM 7.8* 7.5*   LFT Recent Labs    09/02/18 0946  PROT 7.7  ALBUMIN 1.7*  AST 36  ALT 15  ALKPHOS 82  BILITOT 3.0*   PT/INR Recent Labs    09/01/18 0602  LABPROT 23.6*  INR 2.14     Studies/Results: Ct Abdomen Pelvis W Contrast  Result Date: 09/01/2018 CLINICAL DATA:  67 year old male with abdominal stenting following drain placement. Cannot tolerate p.o. Subsequent encounter. EXAM: CT ABDOMEN AND PELVIS WITH CONTRAST TECHNIQUE: Multidetector CT imaging of the abdomen and pelvis was performed using the standard protocol following bolus administration of intravenous contrast. CONTRAST:  197mL OMNIPAQUE IOHEXOL 300 MG/ML  SOLN COMPARISON:  08/30/2018 CT. FINDINGS: Lower chest: Slight increase in size of small bilateral pleural effusions and basilar atelectasis. Pacemaker in place. Heart slightly enlarged. Coronary artery calcifications.  Hepatobiliary: Cirrhotic liver. No dominant focal liver lesion. Recanalization umbilical vein. Portal vein remains patent. Percutaneous cholecystostomy has been performed in the interim. Decrease in size of inflamed gallbladder (which contains multiple stones). Decrease in size, although incomplete resolution, of complex gallbladder wall thickening. There remains mild inflammation of the superior margin of the adjacent right colon although mass effect has decreased in the interim. Inflammatory process approaches but does  not compress the adjacent duodenum. Pancreas: No worrisome pancreatic mass. Spleen: Spleen of normal size without focal lesion. Adrenals/Urinary Tract: No obstructing stone or hydronephrosis. Tiny renal low-density structures too small to characterize statistically likely cysts. No adrenal mass. Noncontrast filled imaging urinary bladder without abnormality. Stomach/Bowel: Distal esophagus narrowing by prominent varix. Scattered colonic diverticula. Evaluation for detection of primary stomach/bowel inflammatory process limited by ascites and under distension. Vascular/Lymphatic: Atherosclerotic changes aorta and aortic branch vessels. No abdominal aortic aneurysm. No large vessel occlusion. Ectatic iliac arteries with right common iliac artery measuring 1.9 cm and left common iliac artery 1.8 cm. Upper abdominal adenopathy greatest porta hepatis and peripancreatic region may be reactive in origin. Reproductive: Slightly lobulated calcified prostate gland impresses upon the bladder base. Other: Ascites minimally changed from prior exam. No free intraperitoneal air. Third spacing of fluid. Musculoskeletal: Degenerative changes lower lumbar spine most notable L5-S1. Bilateral hip joint degenerative changes. IMPRESSION: 1. Percutaneous cholecystostomy has been performed in the interim. Decrease in size of inflamed gallbladder (which contains multiple stones). Decrease in size, although incomplete resolution, of complex gallbladder wall thickening. There remains mild inflammation of the superior margin of the adjacent right colon although mass effect has decreased in the interim. Inflammatory process approaches but does not compress the adjacent duodenum. 2. Slight increase in small pleural effusions and basilar atelectasis. 3. Ascites without significant change.  Third spacing of fluid. 4. Distal esophagus narrowing by prominent varix. 5. Scattered colonic diverticula. Evaluation for detection of primary stomach/bowel  inflammatory process limited by ascites and under distension. 6. Cirrhosis with portal hypertension.  Surrounding varices. 7. Upper abdominal adenopathy may be reactive in origin. 8.  Aortic Atherosclerosis (ICD10-I70.0). 9. Slightly lobulated calcified prostate gland with mild impression upon the bladder base. Electronically Signed   By: Genia Del M.D.   On: 09/01/2018 16:33   Dg Chest Port 1 View  Result Date: 09/02/2018 CLINICAL DATA:  Cough, shortness of breath, recent kidney surgery, history coronary artery disease, CHF, cirrhosis, ethanol abuse, former smoker EXAM: PORTABLE CHEST 1 VIEW COMPARISON:  Portable exam 0652 hours compared to 08/30/2018 FINDINGS: LEFT subclavian AICD leads project at over RIGHT atrium and RIGHT ventricle. Enlargement of cardiac silhouette post CABG. Mediastinal contours and pulmonary vascularity normal. Bibasilar atelectasis greater on LEFT with small LEFT pleural effusion. Upper lungs clear. No pneumothorax. IMPRESSION: Bibasilar atelectasis LEFT greater than RIGHT with small LEFT pleural effusion. Enlargement of cardiac silhouette post CABG and ICD. Electronically Signed   By: Lavonia Dana M.D.   On: 09/02/2018 09:06   Ir Paracentesis  Result Date: 09/02/2018 INDICATION: Patient with acute cholecystitis status post percutaneous cholecystostomy tube. Abdominal distention and discomfort. Underlying history of cirrhosis. Ascites. Request for diagnostic and therapeutic paracentesis. EXAM: ULTRASOUND GUIDED LEFT LOWER QUADRANT PARACENTESIS MEDICATIONS: None. COMPLICATIONS: None immediate. PROCEDURE: Informed written consent was obtained from the patient after a discussion of the risks, benefits and alternatives to treatment. A timeout was performed prior to the initiation of the procedure. Initial ultrasound scanning demonstrates a small to moderate amount of ascites within the left lower abdominal quadrant. The left  lower abdomen was prepped and draped in the usual sterile  fashion. 1% lidocaine with epinephrine was used for local anesthesia. Following this, a 19 gauge, 7-cm, Yueh catheter was introduced. An ultrasound image was saved for documentation purposes. The paracentesis was performed. The catheter was removed and a dressing was applied. The patient tolerated the procedure well without immediate post procedural complication. FINDINGS: A total of approximately 470 mL of cloudy, amber/red fluid was removed. Samples were sent to the laboratory as requested by the clinical team. IMPRESSION: Successful ultrasound-guided paracentesis yielding 470 mL of peritoneal fluid. Read by: Ascencion Dike PA-C Electronically Signed   By: Aletta Edouard M.D.   On: 09/02/2018 11:24    Assessment/Plan: Cholecystitis S/p perc chole drain Paracentesis yesterday of cloudy fluid Await cultures. Drain fxn well. IR following.    LOS: 4 days   I spent a total of 15 minutes in face to face in clinical consultation, greater than 50% of which was counseling/coordinating care for perc chole drain  Ascencion Dike PA-C 09/03/2018 9:35 AM

## 2018-09-03 NOTE — Progress Notes (Signed)
Progress Note  Patient Name: Hunter Carter Date of Encounter: 09/03/2018  Primary Cardiologist: Tyus Kallam  Subjective   No angina, no dyspnea at rest. Appetite poor, abdomen mildly uncomfortable. Had a normal BM.  Inpatient Medications    Scheduled Meds: . docusate sodium  100 mg Oral BID  . polyethylene glycol  17 g Oral Daily  . potassium chloride  40 mEq Oral BID   Continuous Infusions: . sodium chloride 250 mL (08/30/18 1239)  . amiodarone 30 mg/hr (09/03/18 0049)  . ceFEPime (MAXIPIME) IV 1 g (09/03/18 0647)   PRN Meds: albuterol, benzonatate, lip balm, morphine injection   Vital Signs    Vitals:   09/02/18 2330 09/03/18 0349 09/03/18 0500 09/03/18 0700  BP: 128/70 124/70  (!) 118/94  Pulse:  79  83  Resp: (!) 25 (!) 21  19  Temp: 98.5 F (36.9 C) 98.5 F (36.9 C)  98.1 F (36.7 C)  TempSrc: Oral Oral  Oral  SpO2:  91%  94%  Weight:   98.2 kg   Height:        Intake/Output Summary (Last 24 hours) at 09/03/2018 0952 Last data filed at 09/03/2018 0349 Gross per 24 hour  Intake 480 ml  Output 1070 ml  Net -590 ml   Filed Weights   08/30/18 0701 09/03/18 0500  Weight: 96.6 kg 98.2 kg    Telemetry    Now in NSR, had atrial fibrillation with RVR for about 4 hours during the day and 2 hours overnight; in normal rhythm since about 0300h - Personally Reviewed  ECG    No new tracing - Personally Reviewed  Physical Exam  Appears tired, ill, but no distress GEN: No acute distress.   Neck: No JVD Cardiac: RRR, wide-split S2, no murmurs, rubs, or gallops.  Respiratory: Clear to auscultation bilaterally. GI: distended, but not tense, minimally tender MS: 2+ pretibial edema; No deformity. Neuro:  Nonfocal  Psych: Normal affect   Labs    Chemistry Recent Labs  Lab 08/31/18 1141 09/01/18 0602 09/01/18 0928 09/02/18 0946 09/03/18 0608  NA 129* 131* 130* 128* 130*  K 4.5 4.6 4.4 3.9 3.3*  CL 102 102 102 98 95*  CO2 22 22 22 23 26     GLUCOSE 146* 93 102* 95 94  BUN 17 14 14 10 12   CREATININE 1.26* 1.03 1.08 0.91 1.15  CALCIUM 7.8* 7.9* 7.9* 7.8* 7.5*  PROT 7.3 8.3*  --  7.7  --   ALBUMIN 1.5* 1.7*  --  1.7*  --   AST 33 32  --  36  --   ALT 16 16  --  15  --   ALKPHOS 71 80  --  82  --   BILITOT 4.7* 4.5*  --  3.0*  --   GFRNONAA 58* >60 >60 >60 >60  GFRAA >60 >60 >60 >60 >60  ANIONGAP 5 7 6 7 9      Hematology Recent Labs  Lab 09/01/18 0602 09/02/18 0946 09/03/18 0608  WBC 11.2* 9.4 8.7  RBC 4.11* 3.89* 3.94*  HGB 12.8* 12.1* 12.5*  HCT 40.9 37.5* 37.3*  MCV 99.5 96.4 94.7  MCH 31.1 31.1 31.7  MCHC 31.3 32.3 33.5  RDW 14.1 13.8 13.7  PLT 58* 61* 62*    Cardiac Enzymes Recent Labs  Lab 08/30/18 0705  TROPONINI <0.03   No results for input(s): TROPIPOC in the last 168 hours.   BNP Recent Labs  Lab 08/30/18 0705  BNP 133.0*  DDimer No results for input(s): DDIMER in the last 168 hours.   Radiology    Ct Abdomen Pelvis W Contrast  Result Date: 09/01/2018 CLINICAL DATA:  67 year old male with abdominal stenting following drain placement. Cannot tolerate p.o. Subsequent encounter. EXAM: CT ABDOMEN AND PELVIS WITH CONTRAST TECHNIQUE: Multidetector CT imaging of the abdomen and pelvis was performed using the standard protocol following bolus administration of intravenous contrast. CONTRAST:  11mL OMNIPAQUE IOHEXOL 300 MG/ML  SOLN COMPARISON:  08/30/2018 CT. FINDINGS: Lower chest: Slight increase in size of small bilateral pleural effusions and basilar atelectasis. Pacemaker in place. Heart slightly enlarged. Coronary artery calcifications. Hepatobiliary: Cirrhotic liver. No dominant focal liver lesion. Recanalization umbilical vein. Portal vein remains patent. Percutaneous cholecystostomy has been performed in the interim. Decrease in size of inflamed gallbladder (which contains multiple stones). Decrease in size, although incomplete resolution, of complex gallbladder wall thickening. There  remains mild inflammation of the superior margin of the adjacent right colon although mass effect has decreased in the interim. Inflammatory process approaches but does not compress the adjacent duodenum. Pancreas: No worrisome pancreatic mass. Spleen: Spleen of normal size without focal lesion. Adrenals/Urinary Tract: No obstructing stone or hydronephrosis. Tiny renal low-density structures too small to characterize statistically likely cysts. No adrenal mass. Noncontrast filled imaging urinary bladder without abnormality. Stomach/Bowel: Distal esophagus narrowing by prominent varix. Scattered colonic diverticula. Evaluation for detection of primary stomach/bowel inflammatory process limited by ascites and under distension. Vascular/Lymphatic: Atherosclerotic changes aorta and aortic branch vessels. No abdominal aortic aneurysm. No large vessel occlusion. Ectatic iliac arteries with right common iliac artery measuring 1.9 cm and left common iliac artery 1.8 cm. Upper abdominal adenopathy greatest porta hepatis and peripancreatic region may be reactive in origin. Reproductive: Slightly lobulated calcified prostate gland impresses upon the bladder base. Other: Ascites minimally changed from prior exam. No free intraperitoneal air. Third spacing of fluid. Musculoskeletal: Degenerative changes lower lumbar spine most notable L5-S1. Bilateral hip joint degenerative changes. IMPRESSION: 1. Percutaneous cholecystostomy has been performed in the interim. Decrease in size of inflamed gallbladder (which contains multiple stones). Decrease in size, although incomplete resolution, of complex gallbladder wall thickening. There remains mild inflammation of the superior margin of the adjacent right colon although mass effect has decreased in the interim. Inflammatory process approaches but does not compress the adjacent duodenum. 2. Slight increase in small pleural effusions and basilar atelectasis. 3. Ascites without significant  change.  Third spacing of fluid. 4. Distal esophagus narrowing by prominent varix. 5. Scattered colonic diverticula. Evaluation for detection of primary stomach/bowel inflammatory process limited by ascites and under distension. 6. Cirrhosis with portal hypertension.  Surrounding varices. 7. Upper abdominal adenopathy may be reactive in origin. 8.  Aortic Atherosclerosis (ICD10-I70.0). 9. Slightly lobulated calcified prostate gland with mild impression upon the bladder base. Electronically Signed   By: Genia Del M.D.   On: 09/01/2018 16:33   Dg Chest Port 1 View  Result Date: 09/02/2018 CLINICAL DATA:  Cough, shortness of breath, recent kidney surgery, history coronary artery disease, CHF, cirrhosis, ethanol abuse, former smoker EXAM: PORTABLE CHEST 1 VIEW COMPARISON:  Portable exam 0652 hours compared to 08/30/2018 FINDINGS: LEFT subclavian AICD leads project at over RIGHT atrium and RIGHT ventricle. Enlargement of cardiac silhouette post CABG. Mediastinal contours and pulmonary vascularity normal. Bibasilar atelectasis greater on LEFT with small LEFT pleural effusion. Upper lungs clear. No pneumothorax. IMPRESSION: Bibasilar atelectasis LEFT greater than RIGHT with small LEFT pleural effusion. Enlargement of cardiac silhouette post CABG and ICD. Electronically Signed  By: Lavonia Dana M.D.   On: 09/02/2018 09:06   Ir Paracentesis  Result Date: 09/02/2018 INDICATION: Patient with acute cholecystitis status post percutaneous cholecystostomy tube. Abdominal distention and discomfort. Underlying history of cirrhosis. Ascites. Request for diagnostic and therapeutic paracentesis. EXAM: ULTRASOUND GUIDED LEFT LOWER QUADRANT PARACENTESIS MEDICATIONS: None. COMPLICATIONS: None immediate. PROCEDURE: Informed written consent was obtained from the patient after a discussion of the risks, benefits and alternatives to treatment. A timeout was performed prior to the initiation of the procedure. Initial ultrasound  scanning demonstrates a small to moderate amount of ascites within the left lower abdominal quadrant. The left lower abdomen was prepped and draped in the usual sterile fashion. 1% lidocaine with epinephrine was used for local anesthesia. Following this, a 19 gauge, 7-cm, Yueh catheter was introduced. An ultrasound image was saved for documentation purposes. The paracentesis was performed. The catheter was removed and a dressing was applied. The patient tolerated the procedure well without immediate post procedural complication. FINDINGS: A total of approximately 470 mL of cloudy, amber/red fluid was removed. Samples were sent to the laboratory as requested by the clinical team. IMPRESSION: Successful ultrasound-guided paracentesis yielding 470 mL of peritoneal fluid. Read by: Ascencion Dike PA-C Electronically Signed   By: Aletta Edouard M.D.   On: 09/02/2018 11:24    Cardiac Studies   ECHO 09/02/2018  - Left ventricle: The cavity size was normal. There was moderate concentric hypertrophy. Systolic function was vigorous. The estimated ejection fraction was in the range of 65% to 70%. Wall   motion was normal; there were no regional wall motion abnormalities. Left ventricular diastolic function parameters were normal. - Aortic valve: There was trivial regurgitation. - Aortic root: The aortic root was normal in size. - Ascending aorta: The ascending aorta was mildly dilated measuring 41 mm. - Mitral valve: There was mild regurgitation. - Left atrium: The atrium was moderately dilated. - Right ventricle: Systolic function was normal. - Right atrium: Pacer wire or catheter noted in right atrium. - Tricuspid valve: There was moderate regurgitation. - Pulmonic valve: There was no regurgitation. - Pulmonary arteries: Systolic pressure was mildly to moderately increased. PA peak pressure: 42 mm Hg (S). - Inferior vena cava: The vessel was normal in size. - Pericardium, extracardiac: There was no  pericardial effusion. There was a left pleural effusion.  Patient Profile     67 y.o. male with history of CAD and previous CABG, chronic diastolic heart failure, history of second-degree atrioventricular block Mobitz type II and dual-chamber permanent pacemaker, developing atrial flutter/atrial fibrillation with rapid ventricular response in the setting of sepsis, recent cholecystitis with biliary drain, acute bacterial peritonitis, cirrhosis (post viral hep C with virological cure, complicated by history of hepatic encephalopathy and coagulopathy and thrombocytopenia).  Assessment & Plan    1. AFib w RVR: episodes are shorter and tachycardia less severe.  Switch to po amiodarone, maybe for 30 days (not be a great drug for him to take long-term due to chronic liver disease).  Prior to his acute infectious/surgical illness rate control during episodes of atrial fibrillation was good on metoprolol therapy, will restart at lower dose to avoid hypotension. Not a candidate for anticoagulation. He is spontaneously anticoagulated (INR 2.14 yesterday, worse than his baseline around 1.7) and is also moderately thrombocytopenic (platelets 60 K).  He might need further invasive procedures.  He has a previous history of bleeding gastric ulcer requiring transfusion. 2. "CHF", chronic: Normal LVEF.  Diastolic parameters are also completely normal on yesterday's echo.  It seems that his previous volume overload could be explained entirely by chronic liver disease, not CHF.  Previously estimated "dry weight" on his home scales was 216 pounds (in our office 222 pounds), he is 216 pounds today. Mildly hyponatremic. 3. CAD s/p CABG: He has not complained of angina even with extreme tachycardia.  He has a known inferior wall scar without ischemia by his most recent nuclear perfusion study (known occlusion of the SVG bypass to the OM). 4. Cirrhosis: With coagulopathy and moderate thrombocytopenia, thankfully without  evidence of encephalopathy at this time.  He has ascites with secondary peritonitis. 5.  History of second-degree AV block with normally functioning dual-chamber permanent pacemaker: Comprehensive pacemaker check normal on this admission.  Normal device function  For questions or updates, please contact [place] Please consult [email or web address] for contact info under        Signed, Sanda Klein, MD  09/03/2018, 9:52 AM

## 2018-09-04 ENCOUNTER — Inpatient Hospital Stay (HOSPITAL_COMMUNITY): Payer: Medicare Other

## 2018-09-04 DIAGNOSIS — I4892 Unspecified atrial flutter: Secondary | ICD-10-CM

## 2018-09-04 DIAGNOSIS — K659 Peritonitis, unspecified: Secondary | ICD-10-CM

## 2018-09-04 DIAGNOSIS — R609 Edema, unspecified: Secondary | ICD-10-CM

## 2018-09-04 DIAGNOSIS — K7469 Other cirrhosis of liver: Secondary | ICD-10-CM

## 2018-09-04 DIAGNOSIS — R600 Localized edema: Secondary | ICD-10-CM

## 2018-09-04 LAB — BASIC METABOLIC PANEL
ANION GAP: 6 (ref 5–15)
BUN: 14 mg/dL (ref 8–23)
CHLORIDE: 97 mmol/L — AB (ref 98–111)
CO2: 27 mmol/L (ref 22–32)
Calcium: 7.4 mg/dL — ABNORMAL LOW (ref 8.9–10.3)
Creatinine, Ser: 1.3 mg/dL — ABNORMAL HIGH (ref 0.61–1.24)
GFR calc non Af Amer: 56 mL/min — ABNORMAL LOW (ref >60)
Glucose, Bld: 103 mg/dL — ABNORMAL HIGH (ref 70–99)
Potassium: 3.4 mmol/L — ABNORMAL LOW (ref 3.5–5.1)
Sodium: 130 mmol/L — ABNORMAL LOW (ref 135–145)

## 2018-09-04 LAB — AEROBIC/ANAEROBIC CULTURE (SURGICAL/DEEP WOUND): SPECIAL REQUESTS: NORMAL

## 2018-09-04 LAB — CULTURE, BLOOD (ROUTINE X 2)
CULTURE: NO GROWTH
Culture: NO GROWTH
Special Requests: ADEQUATE

## 2018-09-04 LAB — CBC
HEMATOCRIT: 34.9 % — AB (ref 39.0–52.0)
HEMOGLOBIN: 11.8 g/dL — AB (ref 13.0–17.0)
MCH: 31.9 pg (ref 26.0–34.0)
MCHC: 33.8 g/dL (ref 30.0–36.0)
MCV: 94.3 fL (ref 80.0–100.0)
NRBC: 0 % (ref 0.0–0.2)
Platelets: 60 10*3/uL — ABNORMAL LOW (ref 150–400)
RBC: 3.7 MIL/uL — ABNORMAL LOW (ref 4.22–5.81)
RDW: 14 % (ref 11.5–15.5)
WBC: 8.6 10*3/uL (ref 4.0–10.5)

## 2018-09-04 LAB — AEROBIC/ANAEROBIC CULTURE W GRAM STAIN (SURGICAL/DEEP WOUND)

## 2018-09-04 NOTE — Evaluation (Signed)
Occupational Therapy Evaluation Patient Details Name: Hunter Carter MRN: [id number] DOB: Nov 05, 1950 Today's Date: 09/04/2018    History of Present Illness Pt is a 67 y.o. male admitted 08/30/18 with abdominal pain; worked up for sepsis secondary to acute cholecystitis. S/p percutaneous cholecystostomy on 11/16 and subsequent paracenteses on 11/16 and 11/19. Of note recent admission 07/2018 with acute cholecystitis. PMH includes liver cirrhosis, Hep-C, HF, a-fib, CAD (s/p CABG), pacemaker.    Clinical Impression   Pt admitted with the above diagnoses and presents with below problem list. Pt will benefit from continued acute OT to address the below listed deficits and maximize independence with basic ADLs. PTA pt was independent to mod I (occasional use of cane) with ADLs, very active, volunteers, drives. Pt is currently min guard to min A with LB ADLs. Began education on techniques and AE for LB ADLs.      Follow Up Recommendations  Home health OT;Supervision - Intermittent    Equipment Recommendations  3 in 1 bedside commode    Recommendations for Other Services       Precautions / Restrictions Precautions Precautions: Fall Precaution Comments: abdominal percutaneous drain Restrictions Weight Bearing Restrictions: No      Mobility Bed Mobility               General bed mobility comments: Received sitting in recliner  Transfers Overall transfer level: Modified independent Equipment used: Rolling walker (2 wheeled)                  Balance Overall balance assessment: Needs assistance Sitting-balance support: No upper extremity supported;Feet supported Sitting balance-Leahy Scale: Fair Sitting balance - Comments: Unable to reach feet secondary to c/o abdominal soreness     Standing balance-Leahy Scale: Fair Standing balance comment: Can static stand without UE support                           ADL either performed or assessed with clinical  judgement   ADL Overall ADL's : Needs assistance/impaired Eating/Feeding: Set up;Sitting   Grooming: Set up;Sitting   Upper Body Bathing: Minimal assistance;Sitting   Lower Body Bathing: Minimal assistance;Sit to/from stand;Min guard   Upper Body Dressing : Set up;Sitting   Lower Body Dressing: Min guard;Minimal assistance;Sit to/from stand   Toilet Transfer: Min guard;RW;Comfort height toilet   Toileting- Water quality scientist and Hygiene: Min guard;Sit to/from stand   Tub/ Shower Transfer: Min guard   Functional mobility during ADLs: Min guard;Rolling walker General ADL Comments: Pt completed household distance functional mobility. Discussed technique for LB dressing     Vision         Perception     Praxis      Pertinent Vitals/Pain Pain Assessment: 0-10 Pain Score: 7  Faces Pain Scale: Hurts a little bit Pain Location: RLE>abdomen Pain Descriptors / Indicators: Sore Pain Intervention(s): Monitored during session;Repositioned;Other (comment)(pt declined offer to request pain med)     Hand Dominance     Extremity/Trunk Assessment Upper Extremity Assessment Upper Extremity Assessment: Overall WFL for tasks assessed   Lower Extremity Assessment Lower Extremity Assessment: Defer to PT evaluation RLE Deficits / Details: RLE swelling and pain (RN aware; order for ultrasound to assess for DVT)       Communication Communication Communication: No difficulties   Cognition Arousal/Alertness: Awake/alert Behavior During Therapy: WFL for tasks assessed/performed Overall Cognitive Status: Within Functional Limits for tasks assessed  General Comments       Exercises     Shoulder Instructions      Home Living Family/patient expects to be discharged to:: Private residence Living Arrangements: Alone Available Help at Discharge: Family;Available PRN/intermittently Type of Home: Apartment Home Access: Level  entry     Home Layout: One level     Bathroom Shower/Tub: Teacher, early years/pre: Standard     Home Equipment: Cane - single point   Additional Comments: pt lives alone and daughter able to check on him occassionally; uses SPC intermittently      Prior Functioning/Environment Level of Independence: Independent with assistive device(s)        Comments: uses PRN-mostly in community. Drives. 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 Hopes to get back to working out at [employer] List: Decreased activity tolerance;Impaired balance (sitting and/or standing);Decreased knowledge of use of DME or AE;Decreased knowledge of precautions;Pain      OT Treatment/Interventions: Self-care/ADL training;DME and/or AE instruction;Therapeutic activities;Patient/family education;Balance training    OT Goals(Current goals can be found in the care plan section) Acute Rehab OT Goals Patient Stated Goal: Return home; get back to [facility] and volunteering at elementary school OT Goal Formulation: With patient Time For Goal Achievement: 09/18/18 Potential to Achieve Goals: Good ADL Goals Pt Will Perform Grooming: with modified independence;standing Pt Will Perform Lower Body Bathing: with modified independence;with adaptive equipment;sit to/from stand Pt Will Perform Lower Body Dressing: with modified independence;with adaptive equipment;sit to/from stand  OT Frequency: Min 2X/week   Barriers to D/C:            Co-evaluation              AM-PAC PT "6 Clicks" Daily Activity     Outcome Measure Help from another person eating meals?: None Help from another person taking care of personal grooming?: None Help from another person toileting, which includes using toliet, bedpan, or urinal?: A Little Help from another person bathing (including washing, rinsing, drying)?: A Little Help from another person to put on and taking off regular upper body clothing?:  A Little Help from another person to put on and taking off regular lower body clothing?: A Little 6 Click Score: 20   End of Session Equipment Utilized During Treatment: Rolling walker  Activity Tolerance: Patient tolerated treatment well Patient left: in chair;with call bell/phone within reach;with family/visitor present  OT Visit Diagnosis: Unsteadiness on feet (R26.81);Pain                Time: 1214-1223 OT Time Calculation (min): 9 min Charges:  OT General Charges $OT Visit: 1 Visit OT Evaluation $OT Eval Low Complexity: [place], OT Acute Rehabilitation Services Pager: [phone] Office: [phone]   Hortencia Pilar 09/04/2018, 1:19 PM

## 2018-09-04 NOTE — Progress Notes (Signed)
Referring Physician(s): Tsuei,M  Supervising Physician: Markus Daft  Patient Status:  [facility] - In-pt  Chief Complaint:  Cholecystitis, abdominal pain/distension  Subjective: Patient doing okay.  Denies nausea or vomiting.  Sitting up in chair.  Just had breakfast.  Continues to have some bilateral lower extremity edema ([place] pending) and mild abdominal distention.  Minimal tenderness at cholecystostomy drain site.   Allergies: Patient has no known allergies.  Medications: Prior to Admission medications   Medication Sig Start Date End Date Taking? Authorizing Provider  furosemide (LASIX) 40 MG tablet TAKE 1 TABLET BY MOUTH  DAILY AS NEEDED Patient taking differently: Take 40 mg by mouth daily. Make take an additional 40 mg if needed 07/15/18  Yes Lockamy, Timothy, DO  lactulose (CHRONULAC) 10 GM/15ML solution Take 45 mLs (30 g total) by mouth 2 (two) times daily. Titrate for 3-4 BM's/ day Patient taking differently: Take 30 g by mouth 3 (three) times daily. Titrate for 3-4 BM's/ day 03/22/18  Yes Sheikh, Omair Latif, DO  losartan (COZAAR) 25 MG tablet TAKE 1 TABLET BY MOUTH  DAILY Patient taking differently: Take 25 mg by mouth daily.  04/30/18  Yes Lockamy, Timothy, DO  metoprolol tartrate (LOPRESSOR) 50 MG tablet TAKE 1 TABLET BY MOUTH TWO  TIMES DAILY Patient taking differently: Take 50 mg by mouth daily.  07/15/18  Yes Lockamy, Timothy, DO  omeprazole (PRILOSEC) 20 MG capsule TAKE 1 CAPSULE BY MOUTH  DAILY Patient taking differently: Take 20 mg by mouth daily.  07/15/18  Yes Lockamy, Timothy, DO  PROAIR HFA 108 (90 Base) MCG/ACT inhaler USE 2 PUFFS BY MOUTH EVERY  4 HOURS AS NEEDED FOR  WHEEZING OR SHORTNESS OF  BREATH (OR COUGHING) Patient taking differently: Inhale 1-2 puffs into the lungs every 4 (four) hours as needed for wheezing.  06/20/18  Yes Lockamy, Timothy, DO  simvastatin (ZOCOR) 20 MG tablet TAKE 1 TABLET BY MOUTH  DAILY Patient taking differently: Take 20 mg by mouth daily  at 6 PM.  07/15/18  Yes Lockamy, Timothy, DO     Vital Signs: BP 105/63 (BP Location: Left Arm)   Pulse 72   Temp 97.9 F (36.6 C) (Oral)   Resp 18   Ht 5\' 6"  (1.676 m)   Wt 216 lb 7.9 oz (98.2 kg)   SpO2 95%   BMI 34.94 kg/m   Physical Exam awake, alert.  Cholecystostomy drain intact, dressing dry, insertion site okay, mildly tender, output 175 cc bile; abdomen slightly distended.  bilat lower extremity edema noted  Imaging: Ct Abdomen Pelvis W Contrast  Result Date: 09/01/2018 CLINICAL DATA:  67 year old male with abdominal stenting following drain placement. Cannot tolerate p.o. Subsequent encounter. EXAM: CT ABDOMEN AND PELVIS WITH CONTRAST TECHNIQUE: Multidetector CT imaging of the abdomen and pelvis was performed using the standard protocol following bolus administration of intravenous contrast. CONTRAST:  135mL OMNIPAQUE IOHEXOL 300 MG/ML  SOLN COMPARISON:  08/30/2018 CT. FINDINGS: Lower chest: Slight increase in size of small bilateral pleural effusions and basilar atelectasis. Pacemaker in place. Heart slightly enlarged. Coronary artery calcifications. Hepatobiliary: Cirrhotic liver. No dominant focal liver lesion. Recanalization umbilical vein. Portal vein remains patent. Percutaneous cholecystostomy has been performed in the interim. Decrease in size of inflamed gallbladder (which contains multiple stones). Decrease in size, although incomplete resolution, of complex gallbladder wall thickening. There remains mild inflammation of the superior margin of the adjacent right colon although mass effect has decreased in the interim. Inflammatory process approaches but does not compress the adjacent  duodenum. Pancreas: No worrisome pancreatic mass. Spleen: Spleen of normal size without focal lesion. Adrenals/Urinary Tract: No obstructing stone or hydronephrosis. Tiny renal low-density structures too small to characterize statistically likely cysts. No adrenal mass. Noncontrast filled imaging  urinary bladder without abnormality. Stomach/Bowel: Distal esophagus narrowing by prominent varix. Scattered colonic diverticula. Evaluation for detection of primary stomach/bowel inflammatory process limited by ascites and under distension. Vascular/Lymphatic: Atherosclerotic changes aorta and aortic branch vessels. No abdominal aortic aneurysm. No large vessel occlusion. Ectatic iliac arteries with right common iliac artery measuring 1.9 cm and left common iliac artery 1.8 cm. Upper abdominal adenopathy greatest porta hepatis and peripancreatic region may be reactive in origin. Reproductive: Slightly lobulated calcified prostate gland impresses upon the bladder base. Other: Ascites minimally changed from prior exam. No free intraperitoneal air. Third spacing of fluid. Musculoskeletal: Degenerative changes lower lumbar spine most notable L5-S1. Bilateral hip joint degenerative changes. IMPRESSION: 1. Percutaneous cholecystostomy has been performed in the interim. Decrease in size of inflamed gallbladder (which contains multiple stones). Decrease in size, although incomplete resolution, of complex gallbladder wall thickening. There remains mild inflammation of the superior margin of the adjacent right colon although mass effect has decreased in the interim. Inflammatory process approaches but does not compress the adjacent duodenum. 2. Slight increase in small pleural effusions and basilar atelectasis. 3. Ascites without significant change.  Third spacing of fluid. 4. Distal esophagus narrowing by prominent varix. 5. Scattered colonic diverticula. Evaluation for detection of primary stomach/bowel inflammatory process limited by ascites and under distension. 6. Cirrhosis with portal hypertension.  Surrounding varices. 7. Upper abdominal adenopathy may be reactive in origin. 8.  Aortic Atherosclerosis (ICD10-I70.0). 9. Slightly lobulated calcified prostate gland with mild impression upon the bladder base.  Electronically Signed   By: Genia Del M.D.   On: 09/01/2018 16:33   Dg Chest Port 1 View  Result Date: 09/02/2018 CLINICAL DATA:  Cough, shortness of breath, recent kidney surgery, history coronary artery disease, CHF, cirrhosis, ethanol abuse, former smoker EXAM: PORTABLE CHEST 1 VIEW COMPARISON:  Portable exam 0652 hours compared to 08/30/2018 FINDINGS: LEFT subclavian AICD leads project at over RIGHT atrium and RIGHT ventricle. Enlargement of cardiac silhouette post CABG. Mediastinal contours and pulmonary vascularity normal. Bibasilar atelectasis greater on LEFT with small LEFT pleural effusion. Upper lungs clear. No pneumothorax. IMPRESSION: Bibasilar atelectasis LEFT greater than RIGHT with small LEFT pleural effusion. Enlargement of cardiac silhouette post CABG and ICD. Electronically Signed   By: Lavonia Dana M.D.   On: 09/02/2018 09:06   Dg Abd Portable 1v  Result Date: 09/01/2018 CLINICAL DATA:  Three-week history of lower abdominal pain. The patient reports the abdomen is distended and firm on the lower left. No bowel habit changes or nausea or vomiting. History of gastric ulcer disease with bleeding, percutaneous cholecystostomy on August 30, 2018 and paracentesis on the same day. EXAM: PORTABLE ABDOMEN - 1 VIEW COMPARISON:  Abdominal radiographs of August 31, 2018. FINDINGS: The bowel gas pattern is within the limits of normal. Multiple gallstones are noted in the right upper quadrant with a cholecystostomy tube present and stable. No bony abnormalities are observed. IMPRESSION: Fairly normal appearing bowel gas pattern. Known gallstones and cholecystostomy tube. No significant change since the previous study. Electronically Signed   By: David  Martinique M.D.   On: 09/01/2018 09:10   Ir Paracentesis  Result Date: 09/02/2018 INDICATION: Patient with acute cholecystitis status post percutaneous cholecystostomy tube. Abdominal distention and discomfort. Underlying history of cirrhosis.  Ascites. Request for diagnostic  and therapeutic paracentesis. EXAM: ULTRASOUND GUIDED LEFT LOWER QUADRANT PARACENTESIS MEDICATIONS: None. COMPLICATIONS: None immediate. PROCEDURE: Informed written consent was obtained from the patient after a discussion of the risks, benefits and alternatives to treatment. A timeout was performed prior to the initiation of the procedure. Initial ultrasound scanning demonstrates a small to moderate amount of ascites within the left lower abdominal quadrant. The left lower abdomen was prepped and draped in the usual sterile fashion. 1% lidocaine with epinephrine was used for local anesthesia. Following this, a 19 gauge, 7-cm, Yueh catheter was introduced. An ultrasound image was saved for documentation purposes. The paracentesis was performed. The catheter was removed and a dressing was applied. The patient tolerated the procedure well without immediate post procedural complication. FINDINGS: A total of approximately 470 mL of cloudy, amber/red fluid was removed. Samples were sent to the laboratory as requested by the clinical team. IMPRESSION: Successful ultrasound-guided paracentesis yielding 470 mL of peritoneal fluid. Read by: Ascencion Dike PA-C Electronically Signed   By: Aletta Edouard M.D.   On: 09/02/2018 11:24    Labs:  CBC: Recent Labs    09/01/18 0602 09/02/18 0946 09/03/18 0608 09/04/18 0334  WBC 11.2* 9.4 8.7 8.6  HGB 12.8* 12.1* 12.5* 11.8*  HCT 40.9 37.5* 37.3* 34.9*  PLT 58* 61* 62* 60*    COAGS: Recent Labs    07/22/18 0447 08/30/18 0705 08/31/18 0429 09/01/18 0602  INR 2.17 2.08 2.41 2.14    BMP: Recent Labs    09/01/18 0928 09/02/18 0946 09/03/18 0608 09/04/18 0334  NA 130* 128* 130* 130*  K 4.4 3.9 3.3* 3.4*  CL 102 98 95* 97*  CO2 22 23 26 27   GLUCOSE 102* 95 94 103*  BUN 14 10 12 14   CALCIUM 7.9* 7.8* 7.5* 7.4*  CREATININE 1.08 0.91 1.15 1.30*  GFRNONAA >60 >60 >60 56*  GFRAA >60 >60 >60 >60    LIVER FUNCTION  TESTS: Recent Labs    08/31/18 0429 08/31/18 1141 09/01/18 0602 09/02/18 0946  BILITOT 5.1* 4.7* 4.5* 3.0*  AST 32 33 32 36  ALT 15 16 16 15   ALKPHOS 79 71 80 82  PROT 7.2 7.3 8.3* 7.7  ALBUMIN 1.6* 1.5* 1.7* 1.7*    Assessment and Plan: Patient with history of acute cholecystitis, end-stage liver disease/hep C, recurrent ascites; status post percutaneous cholecystostomy on 11/16 and subsequent paracenteses on 11/16 and 11/19.  Afebrile, fluid cultures growing Enterobacter; WBC normal, hemoglobin 11.8, creatinine 1.3; continue with drain irrigation and output monitoring.  Drain will need to remain in place at least 4 to 6 weeks before consideration of removal unless cholecystectomy done in interim (pt not surgical candidate though); scheduled for follow-up drain injection in IR clinic 6 to 8 weeks.  Additional plans per primary team/cardiology/pall care.  Electronically Signed: D. Rowe Robert, PA-C 09/04/2018, 9:51 AM   I spent a total of  15 minutes at the the patient's bedside AND on the patient's hospital floor or unit, greater than 50% of which was counseling/coordinating care for gallbladder drain    Patient ID: Hunter Carter, male   DOB: 1950-11-08, 67 y.o.   MRN: [id number]

## 2018-09-04 NOTE — Discharge Summary (Addendum)
[facility] Discharge Summary  Patient name: Hunter Carter Medical record number: [id number] Date of birth: 05/04/1951 Age: 67 y.o. Gender: male Date of Admission: 08/30/2018  Date of Discharge: 09/10/2018 Admitting Physician: Alveda Reasons, MD  Primary Care Provider: Nuala Alpha, DO Consultants: Palliative, OT/PT, IR, cardiology  Indication for Hospitalization: Abdominal pain, sepsis  Discharge Diagnoses/Problem List:  Acute cholecystitis with cholecystostomy Hypertension CAD status post CABG in 2004 with pacemaker Paroxysmal atrial tachycardia/fibrillation Liver cirrhosis secondary to chronic alcohol use Hepatitis C with previous hepatic encephalopathy Chronic Grade 1 Diastolic Dysfunction CHF High operative risk  Disposition: Discharge to SNF  Discharge Condition: Stable  Discharge Exam:  Physical Exam  Constitutional: He appears well-developed and well-nourished. He does not appear ill.  Eyes: EOM are normal.  Cardiovascular: Normal rate, regular rhythm, normal heart sounds and intact distal pulses.  Pulmonary/Chest: Effort normal and breath sounds normal. He has no wheezes.  Mild crackles to bilateral lung bases  Abdominal: Bowel sounds are normal.  Minimal distension 2/2 Ascites, non-tender to palpation, cholecystostomy clean and draining bilious fluid  Skin: Skin is warm.   Brief Hospital Course:  Hunter Carter is a 67 year old male who presented to the emergency department with abdominal pain secondary to acute cholecystitis, found to be septic. The patient was febrile, tachypneic, and hypotensive, with WBC count 12.5 and lactic acid as high as 4.3. CT of the abdomen showed extensive gallbladder wall thickening and pericholecystic fluid consistent with acute cholecystitis with several gallstones present. The patient was originally placed on IV vancomycin, Zosyn, and Flagyl. Surgery was consulted and recommended interventional  radiology be consulted to discuss percutaneous cholecystostomy due to the patient being a high operative risk. Perc tube was placed by IR on 11/16. During this procedure, bilious and peritoneal cavity fluids were collected and cultured, growing Enterobacter resistant to Zosyn. Cefepime was then added. The patient's vitals continued to stabilize and his overall medical condition improved. The patient was transitioned to p.o. Bactrim which should continue through November 29.  Due to the patient's overall poor prognosis secondary to his liver cirrhosis, palliative was brought in to discuss goals of care with the patient.  The patient found this to be overall very helpful. He decided to opt for a SNF on discharge.  The patient had an occasional episode of supraventricular tachycardia with rates into the 160s.  Cardiology was consulted stat at this time, EKGs were ordered and showed atrial flutter with variable AV block, and the patient was started on IV amiodarone and Lopressor IV for rate and rhythm control.  The patient's heart rhythm improved and the patient was transition to p.o. amiodarone and no further cardiovascular intervention was needed. Amiodarone was eventually discontinued.  The patient presented with an AKI (Cr 1.3) on admission. Lasix were held and the patient became fluid overloaded. Torsemide was started at 20mg  daily, and he was started on spironolactone. The creatinine increased to 1.51. The torsemide was held for one day on 11/25 and his Spironolactone was decreased from 100mg  to 50mg . The patient's creatinine gradually improved and was 1.35 >1.38 on 11/27.  The patient was medically cleared for discharge on 09/10/2018 with close outpatient follow-up and home health.  Issues for Follow Up:  1. Follow-up with GI hepatologist support for Liver Disease. 2. Patient can use Tylenol max 2 g daily for pain control once discharged home. 3. Take Torsemide 20mg  every other day to keep fluid off  and prevent AKI. Check daily weights - if greater than  90 kg give him torsemide. 4. Check BMP at outpatient follow up appointment.  Significant Procedures:  11/16 - cholecystostomy with paracentesis 11/18atrial tachycardia to rate of 180, cardiology consulted, placed on IV amiodarone and metoprolol 11/19 - paracentesis 11/20 cefepime, Flagyl, vancomycin transitioned to oral Bactrim 11/21atrial tachycardialess frequent,developedan AKI likely due to torsemide 11/23atrial tachycardia resolved,amiodarone stopped,still some volume overload, waiting for SNF placement  Significant Labs and Imaging:  Recent Labs  Lab 09/08/18 0950 09/09/18 0519 09/10/18 0319  WBC 5.8 7.9 7.4  HGB 13.6 13.6 13.3  HCT 40.9 41.2 39.7  PLT 81* 77* 82*   Recent Labs  Lab 09/06/18 0602 09/07/18 0410 09/08/18 0950 09/09/18 0519 09/10/18 0319  NA 131* 132* 133* 130* 129*  K 3.2* 4.1 4.5 5.1 4.3  CL 96* 101 99 98 96*  CO2 27 27 30 27 28   GLUCOSE 102* 91 135* 80 90  BUN 14 15 16 15 15   CREATININE 1.26* 1.37* 1.51* 1.35* 1.38*  CALCIUM 7.6* 7.8* 8.2* 8.1* 8.1*  ALKPHOS  --   --  79 79 80  AST  --   --  40 42* 41  ALT  --   --  18 17 19   ALBUMIN  --   --  1.5* 1.5* 1.4*   Results/Tests Pending at Time of Discharge: None  Discharge Medications:  Allergies as of 09/10/2018   No Known Allergies     Medication List    STOP taking these medications   furosemide 40 MG tablet Commonly known as:  LASIX   losartan 25 MG tablet Commonly known as:  COZAAR   simvastatin 20 MG tablet Commonly known as:  ZOCOR     TAKE these medications   lactulose 10 GM/15ML solution Commonly known as:  CHRONULAC Take 45 mLs (30 g total) by mouth 2 (two) times daily. Titrate for 3-4 BM's/ day What changed:  when to take this   metoprolol tartrate 50 MG tablet Commonly known as:  LOPRESSOR TAKE 1 TABLET BY MOUTH TWO  TIMES DAILY What changed:  when to take this   omeprazole 20 MG capsule Commonly known  as:  PRILOSEC TAKE 1 CAPSULE BY MOUTH  DAILY   PROAIR HFA 108 (90 Base) MCG/ACT inhaler Generic drug:  albuterol USE 2 PUFFS BY MOUTH EVERY  4 HOURS AS NEEDED FOR  WHEEZING OR SHORTNESS OF  BREATH (OR COUGHING) What changed:  See the new instructions.   spironolactone 50 MG tablet Commonly known as:  ALDACTONE Take 1 tablet (50 mg total) by mouth daily.   sulfamethoxazole-trimethoprim 800-160 MG tablet Commonly known as:  BACTRIM DS,SEPTRA DS Take 1 tablet by mouth 2 (two) times daily for 3 days.   torsemide 20 MG tablet Commonly known as:  DEMADEX Take 1 tablet (20 mg total) by mouth every other day.            Durable Medical Equipment  (From admission, onward)         Start     Ordered   09/04/18 1724  For home use only DME Bedside commode  Once    Question:  Patient needs a bedside commode to treat with the following condition  Answer:  Shortness of breath at rest   09/04/18 1723   09/04/18 1723  For home use only DME Walker rolling  Once    Question Answer Comment  Patient needs a walker to treat with the following condition Imbalance   Patient needs a walker to treat with the following  condition Shortness of breath at rest      09/04/18 1723         Discharge Instructions: Please refer to Patient Instructions section of EMR for full details.  Patient was counseled important signs and symptoms that should prompt return to medical care, changes in medications, dietary instructions, activity restrictions, and follow up appointments.   Follow-Up Appointments:  Contact information for follow-up providers    Markus Daft, MD Follow up in 8 week(s).   Specialties:  Interventional Radiology, Radiology Why:  follow up with Dr Anselm Pancoast at [facility]- pt will hear from scheduler for time and date---- call [phone] if questions Contact information: [address] [place] [address] [phone]        Martyn Malay, MD. Go on 09/16/2018.    Specialty:  Family Medicine Why:  Please go to your follow up appointment at 10:15AM. Contact information: [address] [place] [address] [phone]            Contact information for after-discharge care    Destination    [facility] Preferred [facility]F .   Service:  Skilled Nursing Contact information: [address] [place], [place], DO 09/10/2018, 11:20 AM PGY-1, [place]

## 2018-09-04 NOTE — Evaluation (Addendum)
Physical Therapy Evaluation Patient Details Name: Hunter Carter MRN: [id number] DOB: Jan 12, 1951 Today's Date: 09/04/2018   History of Present Illness  Pt is a 67 y.o. male admitted 08/30/18 with abdominal pain; worked up for sepsis secondary to acute cholecystitis. S/p percutaneous cholecystostomy on 11/16 and subsequent paracenteses on 11/16 and 11/19. Of note recent admission 07/2018 with acute cholecystitis. PMH includes liver cirrhosis, Hep-C, HF, a-fib, CAD (s/p CABG), pacemaker.     Clinical Impression  Pt presents with an overall decrease in functional mobility secondary to above. PTA, pt mod indep with SPC; lives alone with children nearby to assist PRN. Today, pt ambulatory with RW and supervision for safety; limited by generalized weakness and fatigue. Pt would benefit from continued acute PT services to maximize functional mobility and independence prior to d/c with HHPT services.     Follow Up Recommendations Home health PT;Supervision - Intermittent    Equipment Recommendations  Rolling walker with 5" wheels    Recommendations for Other Services       Precautions / Restrictions Precautions Precautions: Fall Precaution Comments: abdominal percutaneous drain Restrictions Weight Bearing Restrictions: No      Mobility  Bed Mobility               General bed mobility comments: Received sitting in recliner  Transfers Overall transfer level: Modified independent Equipment used: Rolling walker (2 wheeled)                Ambulation/Gait Ambulation/Gait assistance: Supervision Gait Distance (Feet): 100 Feet Assistive device: Rolling walker (2 wheeled) Gait Pattern/deviations: Step-through pattern;Decreased weight shift to right;Antalgic Gait velocity: Decreased Gait velocity interpretation: 1.31 - 2.62 ft/sec, indicative of limited community ambulator General Gait Details: Slow, steady ambulation with RW. Supervision for safety. Intermittent cues to  maintain proximity to [employer]    Modified Rankin (Stroke Patients Only)       Balance Overall balance assessment: Needs assistance   Sitting balance-Leahy Scale: Fair Sitting balance - Comments: Unable to reach feet secondary to c/o abdominal soreness     Standing balance-Leahy Scale: Fair Standing balance comment: Can static stand without UE support                             Pertinent Vitals/Pain Pain Assessment: Faces Faces Pain Scale: Hurts a little bit Pain Location: RLE Pain Descriptors / Indicators: Sore Pain Intervention(s): Limited activity within patient's tolerance    Home Living Family/patient expects to be discharged to:: Private residence Living Arrangements: Alone Available Help at Discharge: Family;Available PRN/intermittently Type of Home: Apartment Home Access: Level entry     Home Layout: One level Home Equipment: Cane - single point Additional Comments: pt lives alone and daughter able to check on him occassionally; uses SPC intermittently    Prior Function Level of Independence: Independent with assistive device(s)         Comments: uses PRN-mostly in community. Drives. 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 Hopes to get back to working out at [employer]        Extremity/Trunk Assessment   Upper Extremity Assessment Upper Extremity Assessment: Overall WFL for tasks assessed    Lower Extremity Assessment Lower Extremity Assessment: RLE deficits/detail RLE Deficits / Details: RLE swelling and pain (RN aware; order for ultrasound to assess for DVT)       Communication  Communication: No difficulties  Cognition Arousal/Alertness: Awake/alert Behavior During Therapy: WFL for tasks assessed/performed Overall Cognitive Status: Within Functional Limits for tasks assessed                                        General Comments       Exercises     Assessment/Plan    PT Assessment Patient needs continued PT services  PT Problem List Decreased strength;Decreased activity tolerance;Decreased balance;Decreased mobility;Decreased knowledge of use of DME       PT Treatment Interventions DME instruction;Gait training;Stair training;Functional mobility training;Therapeutic activities;Therapeutic exercise;Balance training;Patient/family education    PT Goals (Current goals can be found in the Care Plan section)  Acute Rehab PT Goals Patient Stated Goal: Return home; get back to [facility] and volunteering at elementary school PT Goal Formulation: With patient Time For Goal Achievement: 09/18/18 Potential to Achieve Goals: Good    Frequency Min 3X/week   Barriers to discharge Decreased caregiver support      Co-evaluation               AM-PAC PT "6 Clicks" Daily Activity  Outcome Measure Difficulty turning over in bed (including adjusting bedclothes, sheets and blankets)?: A Little Difficulty moving from lying on back to sitting on the side of the bed? : A Little Difficulty sitting down on and standing up from a chair with arms (e.g., wheelchair, bedside commode, etc,.)?: None Help needed moving to and from a bed to chair (including a wheelchair)?: A Little Help needed walking in hospital room?: A Little Help needed climbing 3-5 steps with a railing? : A Little 6 Click Score: 19    End of Session Equipment Utilized During Treatment: Gait belt Activity Tolerance: Patient tolerated treatment well Patient left: in chair;with call bell/phone within reach Nurse Communication: Mobility status PT Visit Diagnosis: Other abnormalities of gait and mobility (R26.89)    Time: 5462-7035 PT Time Calculation (min) (ACUTE ONLY): 19 min   Charges:   PT Evaluation $PT Eval Moderate Complexity: [place], PT, DPT Acute Rehabilitation Services  Pager [phone] Office [place] 09/04/2018, 10:27 AM

## 2018-09-04 NOTE — Progress Notes (Signed)
Daily Progress Note   Patient Name: Hunter Carter       Date: 09/04/2018 DOB: Jul 08, 1951  Age: 67 y.o. MRN#: [id number] Attending Physician: No att. providers found Primary Care Physician: Nuala Alpha, DO Admit Date: 08/30/2018  Reason for Consultation/Follow-up: Establishing goals of care  Subjective: Patient is sitting in bedside chair. His sister and cousin are at bedside. He has a lunch tray which he has eaten a bite of, stating he isn't that hungry and he does not like the food. He is drinking fluids.  He states a doctor had mentioned discharging home, but he does not feel like he is ready. He states he almost fell yesterday, but had his mind to be able to catch himself. Both he and his family acknowledge that when his ammonia level increases, he becomes confused. He states his children came yesterday to visit and he began discussing goals of care, but states they got off on other subjects. Offered a family meeting, and he stated "I'll speak to them."   His sister mentions that he needs help to come into the home several days per week. He states he placed a Medicaid application lat December, but nobody will return his calls.   Recommend palliative at D/C. Recommend HH case Automotive engineer.   Length of Stay: 5  Current Medications: Scheduled Meds:  . amiodarone  400 mg Oral Daily  . docusate sodium  100 mg Oral BID  . metoprolol tartrate  25 mg Oral BID  . polyethylene glycol  17 g Oral Daily  . spironolactone  100 mg Oral Daily  . sulfamethoxazole-trimethoprim  1 tablet Oral Q12H  . torsemide  40 mg Oral Daily    Continuous Infusions: . sodium chloride 250 mL (08/30/18 1239)    PRN Meds: albuterol, benzonatate, lip balm, morphine injection  Physical Exam    Constitutional: No distress.  Pulmonary/Chest: Effort normal.  Neurological: He is alert.  Oriented            Vital Signs: BP 102/66 (BP Location: Left Arm)   Pulse 69   Temp 97.7 F (36.5 C) (Oral)   Resp 18   Ht 5\' 6"  (1.676 m)   Wt 98.2 kg   SpO2 92%   BMI 34.94 kg/m  SpO2: SpO2: 92 % O2 Device: O2 Device: Room Air O2  Flow Rate: O2 Flow Rate (L/min): 2 L/min  Intake/output summary:   Intake/Output Summary (Last 24 hours) at 09/04/2018 1332 Last data filed at 09/04/2018 0559 Gross per 24 hour  Intake -  Output 375 ml  Net -375 ml   LBM: Last BM Date: 09/03/18 Baseline Weight: Weight: 96.6 kg Most recent weight: Weight: 98.2 kg       Palliative Assessment/Data:      Patient Active Problem List   Diagnosis Date Noted  . Abdominal distension   . Sepsis due to Streptococcus species ([place])   . Mobitz II   . Hyperkalemia   . Severe sepsis ([place]) 08/30/2018  . Boil of buttock 08/20/2018  . Cholecystitis, acute 08/20/2018  . Abdominal pain 07/20/2018  . Acute hepatic encephalopathy 03/20/2018  . Gastroesophageal reflux disease 03/11/2018  . CAD of autologous vein bypass graft without angina 03/01/2018  . Healthcare maintenance 02/13/2018  . Skin lesion 02/13/2018  . Cirrhosis ([place]) 08/08/2016  . Paroxysmal atrial fibrillation ([place]) 01/05/2016  . Tobacco abuse 01/05/2016  . Hepatic encephalopathy ([place]) 10/10/2015  . Pain, joint, ankle, left 10/10/2015  . Thrombocytopathia ([place]) 10/10/2015  . HLD (hyperlipidemia) 10/10/2015  . Chronic diastolic congestive heart failure ([place]) 10/10/2015  . Altered mental status   . Increased ammonia level   . Leg swelling   . Encephalopathy acute 08/01/2015  . GI bleed 07/29/2015  . Acute GI bleeding 07/29/2015  . Acute respiratory failure ([place])   . Bleeding gastrointestinal   . Hemorrhagic shock ([place])   . CAD s/p CABG 2004 07/20/2013  . Pacemaker - dual chamber Medtronic 2011 07/20/2013  . Hepatitis C 07/20/2013  .  Hyperlipidemia 07/20/2013  . PAT (paroxysmal atrial tachycardia) ([place]) 07/20/2013  . Obesity (BMI 30-39.9) 07/20/2013  . NSVT (nonsustained ventricular tachycardia) ([place]) 07/20/2013  . HTN (hypertension) 07/16/2013    Palliative Care Assessment & Plan    Recommendations/Plan: He does not wish to discuss goals of care until he has spoken with his children first.   Palliative outpatient  HH CM or SW.   Code Status:    Code Status Orders  (From admission, onward)         Start     Ordered   08/30/18 1727  Do not attempt resuscitation (DNR)  Continuous    Question Answer Comment  In the event of cardiac or respiratory ARREST Do not call a "code blue"   In the event of cardiac or respiratory ARREST Do not perform Intubation, CPR, defibrillation or ACLS   In the event of cardiac or respiratory ARREST Use medication by any route, position, wound care, and other measures to relive pain and suffering. May use oxygen, suction and manual treatment of airway obstruction as needed for comfort.   Comments confirmed with patient and Dr. Higinio Plan on admit      08/30/18 1726        Code Status History    Date Active Date Inactive Code Status Order ID Comments User Context   08/30/2018 1359 08/30/2018 1726 DNR [id number]  Sela Hilding, MD ED   07/20/2018 2218 07/24/2018 1917 Full Code [id number]  Rise Patience, MD Inpatient   03/20/2018 2219 03/22/2018 2033 Full Code [id number]  Rise Patience, MD ED   09/07/2017 2301 09/09/2017 1830 Full Code [id number]  Rise Patience, MD ED   10/10/2015 1940 10/13/2015 1642 Full Code [id number]  Ivor Costa, MD ED   07/29/2015 0345 08/04/2015 1851 Full Code [id number]  Corey Harold, NP ED  Prognosis:   Poor longterm.  Discharge Planning:  Home with Palliative Services   Thank you for allowing the Palliative Medicine Team to assist in the care of this patient.   Total Time 35 min Prolonged Time Billed  no        Greater than 50%  of this time was spent counseling and coordinating care related to the above assessment and plan.  Asencion Gowda, NP  Please contact Palliative Medicine Team phone at [phone] for questions and concerns.

## 2018-09-04 NOTE — Progress Notes (Signed)
RLE venous duplex prelim: negative for DVT.  Daelynn Blower Eunice, RDMS, RVT  

## 2018-09-04 NOTE — Progress Notes (Addendum)
Progress Note  Patient Name: Hunter Carter Date of Encounter: 09/04/2018  Primary Cardiologist: Sanda Klein, MD   Subjective   Hunter Carter has just returned from having an ultrasound of his right leg to rule out DVT.  He is quite sleepy.  He complains of needlelike sticking pains of both of his thighs.  He also has tenderness in the right abdomen.  Cholecystostomy tube is present.  Inpatient Medications    Scheduled Meds: . amiodarone  400 mg Oral Daily  . docusate sodium  100 mg Oral BID  . metoprolol tartrate  25 mg Oral BID  . polyethylene glycol  17 g Oral Daily  . spironolactone  100 mg Oral Daily  . sulfamethoxazole-trimethoprim  1 tablet Oral Q12H  . torsemide  40 mg Oral Daily   Continuous Infusions: . sodium chloride 250 mL (08/30/18 1239)   PRN Meds: albuterol, benzonatate, lip balm, morphine injection   Vital Signs    Vitals:   09/03/18 1137 09/03/18 1300 09/03/18 2159 09/04/18 0557  BP: 120/65  105/63 102/66  Pulse: 99 77 72 69  Resp:   18   Temp:   97.9 F (36.6 C) 97.7 F (36.5 C)  TempSrc:   Oral Oral  SpO2:  99% 95% 92%  Weight:      Height:        Intake/Output Summary (Last 24 hours) at 09/04/2018 1121 Last data filed at 09/04/2018 0559 Gross per 24 hour  Intake -  Output 375 ml  Net -375 ml   Filed Weights   08/30/18 0701 09/03/18 0500  Weight: 96.6 kg 98.2 kg    Telemetry    Sinus rhythm with PACs in the 70's, occ atrial pacing - Personally Reviewed  ECG    No new tracings - Personally Reviewed  Physical Exam   GEN:  Chronically ill-appearing male, no acute distress.   Neck: No JVD Cardiac: RRR, no murmurs, rubs, or gallops.  Respiratory: Clear to auscultation bilaterally, except for scattered end expiratory wheezes GI:  Distended, not rigid, tenderness to the right upper quadrant. cholecystostomy tube is present MS:  2+ lower leg edema; No deformity. Neuro:  Nonfocal, patient is quite sleepy today but arouses to  answer questions Psych: Flat affect   Labs    Chemistry Recent Labs  Lab 08/31/18 1141 09/01/18 0602  09/02/18 0946 09/03/18 0608 09/04/18 0334  NA 129* 131*   < > 128* 130* 130*  K 4.5 4.6   < > 3.9 3.3* 3.4*  CL 102 102   < > 98 95* 97*  CO2 22 22   < > 23 26 27   GLUCOSE 146* 93   < > 95 94 103*  BUN 17 14   < > 10 12 14   CREATININE 1.26* 1.03   < > 0.91 1.15 1.30*  CALCIUM 7.8* 7.9*   < > 7.8* 7.5* 7.4*  PROT 7.3 8.3*  --  7.7  --   --   ALBUMIN 1.5* 1.7*  --  1.7*  --   --   AST 33 32  --  36  --   --   ALT 16 16  --  15  --   --   ALKPHOS 71 80  --  82  --   --   BILITOT 4.7* 4.5*  --  3.0*  --   --   GFRNONAA 58* >60   < > >60 >60 56*  GFRAA >60 >60   < > >60 >  60 >60  ANIONGAP 5 7   < > 7 9 6    < > = values in this interval not displayed.     Hematology Recent Labs  Lab 09/02/18 0946 09/03/18 0608 09/04/18 0334  WBC 9.4 8.7 8.6  RBC 3.89* 3.94* 3.70*  HGB 12.1* 12.5* 11.8*  HCT 37.5* 37.3* 34.9*  MCV 96.4 94.7 94.3  MCH 31.1 31.7 31.9  MCHC 32.3 33.5 33.8  RDW 13.8 13.7 14.0  PLT 61* 62* 60*    Cardiac Enzymes Recent Labs  Lab 08/30/18 0705  TROPONINI <0.03   No results for input(s): TROPIPOC in the last 168 hours.   BNP Recent Labs  Lab 08/30/18 0705  BNP 133.0*     DDimer No results for input(s): DDIMER in the last 168 hours.   Radiology    No results found.  Cardiac Studies   Echocardiogram 09/02/18 Study Conclusions - Left ventricle: The cavity size was normal. There was moderate   concentric hypertrophy. Systolic function was vigorous. The   estimated ejection fraction was in the range of 65% to 70%. Wall   motion was normal; there were no regional wall motion   abnormalities. Left ventricular diastolic function parameters   were normal. - Aortic valve: There was trivial regurgitation. - Aortic root: The aortic root was normal in size. - Ascending aorta: The ascending aorta was mildly dilated measuring   41 mm. - Mitral  valve: There was mild regurgitation. - Left atrium: The atrium was moderately dilated. - Right ventricle: Systolic function was normal. - Right atrium: Pacer wire or catheter noted in right atrium. - Tricuspid valve: There was moderate regurgitation. - Pulmonic valve: There was no regurgitation. - Pulmonary arteries: Systolic pressure was mildly to moderately   increased. PA peak pressure: 42 mm Hg (S). - Inferior vena cava: The vessel was normal in size. - Pericardium, extracardiac: There was no pericardial effusion.   There was a left pleural effusion.  Patient Profile     67 y.o. male with history of CAD and previous CABG, chronic diastolic heart failure, history of second-degree atrioventricular block Mobitz type II and dual-chamber permanent pacemaker, developing atrial flutter/atrial fibrillation with rapid ventricular response in the setting of sepsis, recent cholecystitis with biliary drain, acute bacterial peritonitis,cirrhosis (post viral hep C with virological cure,complicated by history of hepatic encephalopathy and coagulopathy and thrombocytopenia).  Assessment & Plan    Atrial fibrillation with RVR -Has been switched to oral amiodarone, may be for 30 days as it is not a great drug for long-term use due to his chronic liver disease. -Prior to his acute illness his atrial fibrillation was well controlled on metoprolol therapy, 50 mg BID.  His last pacemaker interrogation (08/22/18) indicated 0.7% AT/AF.  -Metoprolol has been resumed at low dose, 25 mg twice daily, yesterday.  Blood pressure is stable, a little soft this morning at 105/63.  Continue to monitor blood pressure on current therapy.  -Currently he is maintaining sinus rhythm with PACs -Not a candidate for anticoagulation.  He has intrinsic altered clotting due to disease process with elevated INRs, 2.14 on 11/18, as well as thrombocytopenia with Plts 60.   "CHF", chronic -Normal EF with completely normal diastolic  parameters by echo on 09/02/18 -Prior volume overload seems to be more consistent with chronic liver disease than CHF.  Previous estimated dry weight on his home scales was ~216 pounds (in our office 222 pounds), weight yesterday 216 pounds (no wt today).  CAD S/P CABG -Currently  no anginal symptoms even with A. fib, RVR. -He has a known inferior wall scar without ischemia by his most recent nuclear perfusion study (known occlusion of the SVG bypass to the OM).  Cirrhosis -With coagulopathy and moderate thrombocytopenia.  No evidence of encephalopathy so far -He has ascites with secondary peritonitis being treated with antibiotics.  -He is on Demadex for lower extremity edema/ascites  Cholecystitis in setting of end-stage liver disease with ascites -He is status post percutaneous cholecystostomy on 11/16 and subsequent paracentesis on 11/16 and 11/19.  His drain will be kept in place for at least 4-6 weeks.   History of second-degree AV block with normally functioning dual-chamber pacemaker -Pacemaker check on admission was normal  Renal insufficiency -Prior to this illness renal function was normal.  Serum creatinine was 1.47 on admission and trended down to 0.91 on 09/02/2018.  Creatinine is drifting back up > 1.15> 1.30.  -Continue to monitor renal function  For questions or updates, please contact [place] Please consult [email or web address] for contact info under        Signed, Daune Perch, NP  09/04/2018, 11:21 AM     I have seen and examined the patient along with Daune Perch, NP .  I have reviewed the chart, notes and new data.  I agree with PA/NP's note.  Key new complaints: clearly feeling better, denies dyspnea or chest pain, but has a supine cough Key examination changes: abdomen feels a little less tense, has some leg swelling. Key new findings / data: creat creeping back up a little. No new AFib last 36 hours or so.  PLAN: On PO amiodarone 400 mg, plan to  reduce to 200 mg daily after a week, hopefully stop it in a month or less. He probably does have mild hypervolemia, but no fluid in lungs as far as I can tell. Caution with diuretics since he is at risk for hepatorenal sd.  Sanda Klein, MD, [place] [phone] 09/04/2018, 3:57 PM

## 2018-09-04 NOTE — Progress Notes (Signed)
OT Cancellation Note  Patient Details Name: Hunter Carter MRN: [id number] DOB: 1951-04-19   Cancelled Treatment:    Reason Eval/Treat Not Completed: Patient at procedure or test/ unavailable. Plan to reattempt as able.  Tyrone Schimke, OT Acute Rehabilitation Services Pager: [phone] Office: [phone]   09/04/2018, 10:38 AM

## 2018-09-04 NOTE — Care Management Note (Signed)
Case Management Note  Patient Details  Name: Ching Rabideau MRN: [id number] Date of Birth: [birth year]  Subjective/Objective:      Admitted with abdominal pain /  sepsis secondary to acute cholecystitis.  - S/p percutaneous cholecystostomy on 11/16 and subsequent paracenteses on 11/16.   From home alone.          Burgess Estelle (Daughter) Lennie Hummer (Niece)    [phone]      Action/Plan: NCM following for TOC needs...  Per PT's recommendations: Home health PT;Supervision - Intermittent  Per OT's recommendations: Home health OT;Supervision - Intermittent   Choice offered per NCM. NCM to f/u with choice selected.  Expected Discharge Date:                  Expected Discharge Plan:  [place]  In-House Referral:     Discharge planning Services  CM Consult  Post Acute Care Choice:    Choice offered to:  Patient  DME Arranged:    DME Agency:     HH Arranged:    [place] Agency:     Status of Service:  In process, will continue to follow  If discussed at Long Length of Stay Meetings, dates discussed:    Additional Comments:  Sharin Mons, RN 09/04/2018, 4:25 PM

## 2018-09-04 NOTE — Progress Notes (Signed)
Family Medicine Teaching Service Daily Progress Note Intern Pager: [phone]  Patient name: Hunter Carter Medical record number: [id number] Date of birth: Dec 06, 1950 Age: 67 y.o. Gender: male  Primary Care Provider: Nuala Alpha, DO Consultants:Surgery, IR Code Status:DNR  Pt Overview and Major Events to Date: 11/16 admitted with sepsis 2/2 cholecystitis, perc cholecystostomy tube placed by IR 11/17continued abd pain  Assessment and Plan: Hunter Carter a 67 y.o.male septic with acute cholecystitis. PMH is significant forHTN, CAD s/p CABG in 2004 with pacemaker in place, paroxysmal atrial tachycardia, liver cirrhosis 2/2 chronic alcohol abuse and treated Hep C with previous hepatic encephalopathy, chronic diastolic CHF, and paroxysmal atrial fibrillation.   Sepsis 2/2 acute cholecystitis, improving:s/p perc tube 11/16, WBC 12.5 > 11.2> 9.4> 8.7>8.6 on 11/21.Bilious/peritoneal fluidculturegrowing moderate Enterobacter. Tmax 97.9*F, Vitals stable.Paracentesis was performed 11/19 producing cloudy amber-colored fluid with 16,000 white blood cells, 77% neutrophils, lactate dehydrogenase of 213, without organisms seen on Gram stain. -IRConsulted- Continuing to follow. -Started Bactrim 11/20 through November 29. -Zofran PRN nausea -Daily CBC, CMP  -IV NS KVO -PT/OT to see 11/21  Lower Extremity Swelling: new onset right LE edema and erythema more priminent than L. Not clear cellulitis. Concern for DVT in pt with cirrhosis. -RLE U/S -negative for DVT -Elevate legs, compression stockings. -Torsemide  Chronic HFpEFstable: CXR11/19 showing L pleural effusion. Also with 1+ bilateral LE edema and crackles on lung exam.Follows with Dr. Lorrin Mais 11/19 - EF 65-70% without wall motion abnormality  -IV fluids KVO'ed 11/19 -Metoprolol 25mg  daily -Torsemide -Holding home losartan in setting of hypotension  -Holding home statin -Elevate legs, compression  stockings.  Paroxysmal ATand atrial fibwith pacemaker,stable: Follows with Dr. Loletha Grayer outpatient. Had A.fib with RVR for ~4 hours 11/20 and overnight. Echo 11/19 shows LV with 65-70% EF and normal wall motion. HR ON in 70s 11/21. -Cardiac monitoring -Amiodarone 400mg  PO daily for total of 30 days -Metoprolol 25mg  BID  Hypotension, improving:94/52 on admission >105/63 11/21. -Spironolactone 100mg  daily -Monitor daily vitals -Continue holding metoprolol and Lasix  Hyperkalemia, resolved:K 5.4on admission>4.4> 3.9> 3.4 11/21. -Monitor -KDur 52mEq BID  Liver cirrhosis 2/2previousalcohol abuse and treated Hepatitis C: MELD-Na score 27, 27-32% 90-day mortality. Poor synthetic function - INR 2.41.Overall, patient has poor prognosis and would benefit from palliative consult.  -Palliative Consult when patient more stable -Spironolactone 100mg  daily  SOB with dry cough:CXR11/19 showing L pleural effusion. Also with 2+ bilateral LE edema and crackles on lung exam 11/20. -Monitor symptoms -Tessalon PRN cough -Restart Torsemide 40mg  daily -Treatment for gallbladder as above -Albuterol nebulizer every 4 hours as needed for wheezing -IV fluids KVO'ed  AKI, stable:Cr 1.47 >1.03>0.91>1.3011/21.Baseline around 0.9. Likely in the setting of poor perfusion secondary to acute sepsis.  -Avoid nephrotoxic medications as possible  -daily BMP  -mIVF 75 ml/hr   CAD s/p CABG in 2004, stable:EKG without ischemic changes, troponin <0.03.  -Hold home statin, not on secondary prevention with aspirin daily given previous GI bleed and severe liver cirrhosis.  FEN/GI:Fluids KVO'ed;Regular diet,currently NPO pending paracentesis, colace/miralax JYN:WGNF  Disposition:The patient was moved to telemetry. Can discharge home once clinically improved.  Subjective:  The patient was seen this morning sitting upright in his bed eating breakfast.  He states he feels much better today  and does not have any more chest pain.  He states his lower extremities continue to be very swollen and feel "tingly".  He said he had a great conversation with the palliative team yesterday.  He feels stable enough to go home.  Objective: Temp:  [  97.9 F (36.6 C)] 97.9 F (36.6 C) (11/20 2159) Pulse Rate:  [72-99] 72 (11/20 2159) Resp:  [18] 18 (11/20 2159) BP: (105-120)/(63-65) 105/63 (11/20 2159) SpO2:  [95 %-99 %] 95 % (11/20 2159)  Physical Exam  Constitutional: He is oriented to person, place, and time. He appears well-developed and well-nourished. He appears ill.  Eyes: Scleral icterus is present.  Cardiovascular: Normal rate, regular rhythm and normal heart sounds.  Pulmonary/Chest: Effort normal and breath sounds normal.  Abdominal: Bowel sounds are normal. He exhibits distension. There is no rebound.  Musculoskeletal: He exhibits edema (2+ pitting in bilateral lower extremities).  Neurological: He is alert and oriented to person, place, and time.  Skin: Skin is warm and dry. Capillary refill takes less than 2 seconds.  Psychiatric: He has a normal mood and affect.   Laboratory: Recent Labs  Lab 09/02/18 0946 09/03/18 0608 09/04/18 0334  WBC 9.4 8.7 8.6  HGB 12.1* 12.5* 11.8*  HCT 37.5* 37.3* 34.9*  PLT 61* 62* 60*   Recent Labs  Lab 08/31/18 1141 09/01/18 0602  09/02/18 0946 09/03/18 0608 09/04/18 0334  NA 129* 131*   < > 128* 130* 130*  K 4.5 4.6   < > 3.9 3.3* 3.4*  CL 102 102   < > 98 95* 97*  CO2 22 22   < > 23 26 27   BUN 17 14   < > 10 12 14   CREATININE 1.26* 1.03   < > 0.91 1.15 1.30*  CALCIUM 7.8* 7.9*   < > 7.8* 7.5* 7.4*  PROT 7.3 8.3*  --  7.7  --   --   BILITOT 4.7* 4.5*  --  3.0*  --   --   ALKPHOS 71 80  --  82  --   --   ALT 16 16  --  15  --   --   AST 33 32  --  36  --   --   GLUCOSE 146* 93   < > 95 94 103*   < > = values in this interval not displayed.    Imaging/Diagnostic Tests: Dg Abd 1 View: 11/17Cholelithiasis and  cholecystostomy tube. No acute abnormality noted.   Ct Angio Chest Pe W Or Wo Contrast: 08/30/2018 1. New extensive gallbladder wall thickening and pericholecystic fluid highly suspicious for acute cholecystitis. There are multiple underlying calcified gallstones. No significant biliary dilatation.  2. Cirrhosis and portal hypertension with multiple varices, similar to previous study.  3. Ascites and diffuse mesenteric edema.  4. No acute vascular findings are identified. There is no evidence of acute pulmonary embolism. Aortic Atherosclerosis (ICD10-I70.0).  5. Preliminary results have been discussed with Dr. Ellender Hose.   Ct Abdomen Pelvis W Contrast: 09/01/2018 1. Percutaneous cholecystostomy has been performed in the interim. Decrease in size of inflamed gallbladder (which contains multiple stones). Decrease in size, although incomplete resolution, of complex gallbladder wall thickening. There remains mild inflammation of the superior margin of the adjacent right colon although mass effect has decreased in the interim. Inflammatory process approaches but does not compress the adjacent duodenum.  2. Slight increase in small pleural effusions and basilar atelectasis.  3. Ascites without significant change. Third spacing of fluid.  4. Distal esophagus narrowing by prominent varix.  5. Scattered colonic diverticula. Evaluation for detection of primary stomach/bowel inflammatory process limited by ascites and under distension.  6. Cirrhosis with portal hypertension. Surrounding varices.  7. Upper abdominal adenopathy may be reactive in origin.  8. Aortic  Atherosclerosis (ICD10-I70.0).  9. Slightly lobulated calcified prostate gland with mild impression upon the bladder base.   Ct Abdomen Pelvis W Contrast 08/30/2018 1. New extensive gallbladder wall thickening and pericholecystic fluid highly suspicious for acute cholecystitis. There are multiple underlying calcified gallstones. No significant  biliary dilatation.  2. Cirrhosis and portal hypertension with multiple varices, similar to previous study.  3. Ascites and diffuse mesenteric edema.  4. No acute vascular findings are identified. There is no evidence of acute pulmonary embolism. Aortic Atherosclerosis (ICD10-I70.0).  5. Preliminary results have been discussed with Dr. Ellender Hose.   Ir Perc Cholecystostomy: 08/30/2018 Successful image guided percutaneous cholecystostomy tube placement. In addition, 300 mL perihepatic ascites was removed during the procedure. Appearance of the peritoneal fluid is concerning for infection and underlying gallbladder perforation. Gallbladder fluid and peritoneal fluid was sent for culture.  Dg Chest Port 1 View: 09/02/2018.Bibasilar atelectasis LEFT greater than RIGHT with small LEFT pleural effusion. Enlargement of cardiac silhouette post CABG and ICD.   Dg Chest Portable 1 View:08/30/2018,Stable chest post CABG. No active cardiopulmonary process. Electronically Signed By: Richardean Sale M.D. On: 08/30/2018 08:07   Dg Abd Portable 1v11/18:Fairly normal appearing bowel gas pattern. Known gallstones and cholecystostomy tube. No significant change since the previous study.   Ir Paracentesis:Successful image guided percutaneous cholecystostomy tube placement. In addition, 300 mL perihepatic ascites was removed during the procedure. Appearance of the peritoneal fluid is concerning for infection and underlying gallbladder perforation. Gallbladder fluid and peritoneal fluid was sent for culture.  Daisy Floro, DO 09/04/2018, 8:37 AM PGY-1, [place] Intern pager: [phone], text pages welcome

## 2018-09-05 DIAGNOSIS — E876 Hypokalemia: Secondary | ICD-10-CM

## 2018-09-05 LAB — CBC
HEMATOCRIT: 36.7 % — AB (ref 39.0–52.0)
Hemoglobin: 12.4 g/dL — ABNORMAL LOW (ref 13.0–17.0)
MCH: 32 pg (ref 26.0–34.0)
MCHC: 33.8 g/dL (ref 30.0–36.0)
MCV: 94.6 fL (ref 80.0–100.0)
NRBC: 0.3 % — AB (ref 0.0–0.2)
Platelets: 64 10*3/uL — ABNORMAL LOW (ref 150–400)
RBC: 3.88 MIL/uL — ABNORMAL LOW (ref 4.22–5.81)
RDW: 14.6 % (ref 11.5–15.5)
WBC: 7.1 10*3/uL (ref 4.0–10.5)

## 2018-09-05 LAB — GLUCOSE, CAPILLARY: Glucose-Capillary: 111 mg/dL — ABNORMAL HIGH (ref 70–99)

## 2018-09-05 LAB — BASIC METABOLIC PANEL
ANION GAP: 5 (ref 5–15)
BUN: 13 mg/dL (ref 8–23)
CHLORIDE: 100 mmol/L (ref 98–111)
CO2: 26 mmol/L (ref 22–32)
Calcium: 7.4 mg/dL — ABNORMAL LOW (ref 8.9–10.3)
Creatinine, Ser: 1.32 mg/dL — ABNORMAL HIGH (ref 0.61–1.24)
GFR calc non Af Amer: 55 mL/min — ABNORMAL LOW (ref >60)
Glucose, Bld: 99 mg/dL (ref 70–99)
Potassium: 3 mmol/L — ABNORMAL LOW (ref 3.5–5.1)
Sodium: 131 mmol/L — ABNORMAL LOW (ref 135–145)

## 2018-09-05 MED ORDER — ACETAMINOPHEN 500 MG PO TABS
500.0000 mg | ORAL_TABLET | Freq: Four times a day (QID) | ORAL | Status: DC | PRN
  Administered 2018-09-05 – 2018-09-09 (×4): 500 mg via ORAL
  Filled 2018-09-05 (×5): qty 1

## 2018-09-05 MED ORDER — TORSEMIDE 20 MG PO TABS
20.0000 mg | ORAL_TABLET | Freq: Every day | ORAL | Status: AC
  Administered 2018-09-06 – 2018-09-07 (×2): 20 mg via ORAL
  Filled 2018-09-05 (×2): qty 1

## 2018-09-05 MED ORDER — LACTULOSE 10 GM/15ML PO SOLN
30.0000 g | Freq: Every day | ORAL | Status: DC
  Administered 2018-09-05 – 2018-09-10 (×6): 30 g via ORAL
  Filled 2018-09-05 (×6): qty 45

## 2018-09-05 MED ORDER — POTASSIUM CHLORIDE CRYS ER 20 MEQ PO TBCR
40.0000 meq | EXTENDED_RELEASE_TABLET | Freq: Two times a day (BID) | ORAL | Status: AC
  Administered 2018-09-05 – 2018-09-06 (×2): 40 meq via ORAL
  Filled 2018-09-05 (×2): qty 2

## 2018-09-05 MED ORDER — ENSURE ENLIVE PO LIQD
237.0000 mL | Freq: Two times a day (BID) | ORAL | Status: DC
  Administered 2018-09-06: 237 mL via ORAL

## 2018-09-05 MED ORDER — HEPARIN SODIUM (PORCINE) 5000 UNIT/ML IJ SOLN
5000.0000 [IU] | Freq: Three times a day (TID) | INTRAMUSCULAR | Status: DC
  Administered 2018-09-05 – 2018-09-10 (×14): 5000 [IU] via SUBCUTANEOUS
  Filled 2018-09-05 (×14): qty 1

## 2018-09-05 MED ORDER — AMIODARONE HCL 200 MG PO TABS
200.0000 mg | ORAL_TABLET | Freq: Every day | ORAL | Status: DC
  Administered 2018-09-06: 200 mg via ORAL
  Filled 2018-09-05: qty 1

## 2018-09-05 NOTE — Progress Notes (Signed)
Occupational Therapy Treatment Patient Details Name: Hunter Carter MRN: [id number] DOB: 01-28-1951 Today's Date: 09/05/2018    History of present illness Pt is a 67 y.o. male admitted 08/30/18 with abdominal pain; worked up for sepsis secondary to acute cholecystitis. S/p percutaneous cholecystostomy on 11/16 and subsequent paracenteses on 11/16 and 11/19. Of note recent admission 07/2018 with acute cholecystitis. PMH includes liver cirrhosis, Hep-C, HF, a-fib, CAD (s/p CABG), pacemaker.    OT comments  Pt making good progress. Focus of session on use of AE to complete LB ADL. Continue to recommend HHOT - discussed with pt and pt in agreement.  Will continue to follow acutely. Pt appreciative.   Follow Up Recommendations  Home health OT;Supervision - Intermittent    Equipment Recommendations  3 in 1 bedside commode    Recommendations for Other Services      Precautions / Restrictions Precautions Precautions: Fall Precaution Comments: abdominal percutaneous drain       Mobility Bed Mobility               General bed mobility comments: OOB in chair  Transfers Overall transfer level: Needs assistance Equipment used: Rolling walker (2 wheeled) Transfers: Sit to/from Stand Sit to Stand: Supervision         General transfer comment: vc for safe hand placement and to not "fall" into chair    Balance                                           ADL either performed or assessed with clinical judgement   ADL Overall ADL's : Needs assistance/impaired     Grooming: Supervision/safety;Standing       Lower Body Bathing: Minimal assistance       Lower Body Dressing: Minimal assistance;With adaptive equipment   Toilet Transfer: Supervision/safety;Ambulation;RW   Toileting- Clothing Manipulation and Hygiene: Modified independent       Functional mobility during ADLs: Supervision/safety;Rolling walker;Cueing for safety General ADL Comments:  Educated pt on use of AE for LB ADL. Pt able to return demosntrate. Educated on availability of AE. Pt verbalized understanding.      Vision       Perception     Praxis      Cognition Arousal/Alertness: Awake/alert Behavior During Therapy: WFL for tasks assessed/performed Overall Cognitive Status: Within Functional Limits for tasks assessed                                          Exercises     Shoulder Instructions       General Comments      Pertinent Vitals/ Pain       Pain Assessment: No/denies pain  Home Living                                          Prior Functioning/Environment              Frequency  Min 2X/week        Progress Toward Goals  OT Goals(current goals can now be found in the care plan section)  Progress towards OT goals: Progressing toward goals  Acute Rehab OT Goals Patient Stated Goal: Return home; get back to  [employer] and volunteering at elementary school OT Goal Formulation: With patient Time For Goal Achievement: 09/18/18 Potential to Achieve Goals: Good ADL Goals Pt Will Perform Grooming: with modified independence;standing Pt Will Perform Lower Body Bathing: with modified independence;with adaptive equipment;sit to/from stand Pt Will Perform Lower Body Dressing: with modified independence;with adaptive equipment;sit to/from stand  Plan Discharge plan remains appropriate    Co-evaluation                 AM-PAC PT "6 Clicks" Daily Activity     Outcome Measure   Help from another person eating meals?: None Help from another person taking care of personal grooming?: None Help from another person toileting, which includes using toliet, bedpan, or urinal?: None Help from another person bathing (including washing, rinsing, drying)?: A Little Help from another person to put on and taking off regular upper body clothing?: None Help from another person to put on and taking off regular  lower body clothing?: A Little 6 Click Score: 22    End of Session Equipment Utilized During Treatment: Gait belt;Rolling walker  OT Visit Diagnosis: Unsteadiness on feet (R26.81);Pain   Activity Tolerance Patient tolerated treatment well   Patient Left in chair;with call bell/phone within reach   Nurse Communication Mobility status        Time: 6168-3729 OT Time Calculation (min): 25 min  Charges: OT General Charges $OT Visit: 1 Visit OT Treatments $Self Care/Home Management : 23-37 mins  Maurie Boettcher, OT/L   Acute OT Clinical Specialist [place] Pager [phone] Office [phone]    [facility] 09/05/2018, 9:09 AM

## 2018-09-05 NOTE — Progress Notes (Addendum)
Tx performed and charted by SPTA under direct guidance and supervision of PTA at all times. This session was performed under the supervision of a licensed clinician.   Charting reviewed for accuracy and depicts tx performed and services provided.  Will inform supervising PT of need for change in recommendations.  Spoke with MD who reports patient is on board with decision as well.   Governor Rooks, PTA Acute Rehabilitation Services Pager [phone] Office [phone]   Physical Therapy Treatment Patient Details Name: Hunter Carter MRN: [id number] DOB: [birth year] Today's Date: 09/05/2018    History of Present Illness Pt is a 67 y.o. male admitted 08/30/18 with abdominal pain; worked up for sepsis secondary to acute cholecystitis. S/p percutaneous cholecystostomy on 11/16 and subsequent paracenteses on 11/16 and 11/19. Of note recent admission 07/2018 with acute cholecystitis. PMH includes liver cirrhosis, Hep-C, HF, a-fib, CAD (s/p CABG), pacemaker.     PT Comments    Pt was in chair upon arrival. Pt agreeable to therapy but stated he felt tired today. He was very soft spoken throughout session and would close his eyes during seated LE exercise. Today's session focused on gait training and strengthening LE's. Pt required  Min guard to Min assist with gait this session to maintain balance and for safety. Pt SOB during gait, asking for a standing rest break (O2 89% RA then rising to 94% and raising to 100% once in chair). Pt is progressing toward stated goals and would benefit from continued PT in order to maximize functional independence. Based on current functional status and having no support at home recommend d/c to SNF. Will inform supervising PT of update in follow up recommendation.    Follow Up Recommendations  Supervision - Intermittent;SNF     Equipment Recommendations  Rolling walker with 5" wheels    Recommendations for Other Services       Precautions / Restrictions  Precautions Precautions: Fall Precaution Comments: abdominal percutaneous drain Restrictions Weight Bearing Restrictions: No    Mobility  Bed Mobility               General bed mobility comments: OOB in chair  Transfers     Transfers: Sit to/from Stand Sit to Stand: Supervision         General transfer comment: vc for safe hand placement and to not "fall" into chair  Ambulation/Gait Ambulation/Gait assistance: Min guard;Min assist Gait Distance (Feet): 75 Feet(first trail to bathroom to void) Assistive device: Rolling walker (2 wheeled) Gait Pattern/deviations: Step-through pattern;Decreased weight shift to right;Antalgic     General Gait Details: Slow, steady ambulation with RW.  Min assist for safety during turns. Intermittent cues to maintain proximity to RW and for proper hand placement . Pt asked for standing rest break during gait training due to SOB. O2 on RA reading 89% then raising to 94%. Back in chair O2 on RA back to 100%.   Stairs             Wheelchair Mobility    Modified Rankin (Stroke Patients Only)       Balance Overall balance assessment: Needs assistance Sitting-balance support: No upper extremity supported;Feet supported Sitting balance-Leahy Scale: Fair Sitting balance - Comments: Min guard for safety     Standing balance-Leahy Scale: Fair Standing balance comment: reliant on RW for support this session                            Cognition Arousal/Alertness: Awake/alert Behavior  During Therapy: WFL for tasks assessed/performed Overall Cognitive Status: Within Functional Limits for tasks assessed                                 General Comments: Pt stated he felt fatigued this session. O2 on RA ranging from 89-100% throughout session. Pt intructed on pursed lip breathing throughout session      Exercises Total Joint Exercises Ankle Circles/Pumps: 15 reps;Right;Left;Seated;AROM Quad Sets: AROM;10  reps;Supine;Right;Left Hip ABduction/ADduction: AROM;10 reps;Right;Left;Supine Long Arc Quad: AROM;Right;Left;10 reps;Seated    General Comments        Pertinent Vitals/Pain Faces Pain Scale: Hurts a little bit Pain Location: RLE and abdomen Pain Descriptors / Indicators: Sore Pain Intervention(s): Monitored during session;Repositioned;Limited activity within patient's tolerance    Home Living                      Prior Function            PT Goals (current goals can now be found in the care plan section) Acute Rehab PT Goals Patient Stated Goal: Return home; get back to [facility] and volunteering at elementary school PT Goal Formulation: With patient Time For Goal Achievement: 09/18/18 Potential to Achieve Goals: Good Progress towards PT goals: Progressing toward goals    Frequency    Min 3X/week      PT Plan Discharge plan needs to be updated    Co-evaluation              AM-PAC PT "6 Clicks" Daily Activity  Outcome Measure  Difficulty turning over in bed (including adjusting bedclothes, sheets and blankets)?: A Little Difficulty moving from lying on back to sitting on the side of the bed? : A Little Difficulty sitting down on and standing up from a chair with arms (e.g., wheelchair, bedside commode, etc,.)?: None Help needed moving to and from a bed to chair (including a wheelchair)?: A Little Help needed walking in hospital room?: A Little Help needed climbing 3-5 steps with a railing? : A Little 6 Click Score: 19    End of Session Equipment Utilized During Treatment: Gait belt Activity Tolerance: Patient limited by fatigue Patient left: in chair;with call bell/phone within reach;with chair alarm set Nurse Communication: Mobility status PT Visit Diagnosis: Other abnormalities of gait and mobility (R26.89)     Time: 1430-1459 PT Time Calculation (min) (ACUTE ONLY): 29 min  Charges:  $Gait Training: 8-22 mins $Therapeutic Activity: 8-22  mins                     [address], SPTA   [place] 09/05/2018, 4:22 PM

## 2018-09-05 NOTE — Progress Notes (Addendum)
Progress Note  Patient Name: Hunter Carter Date of Encounter: 09/05/2018  Primary Cardiologist: Sanda Klein, MD   Subjective   Sleepy and drowsy. No chest pain or dyspnea. On home palliative care now.   Inpatient Medications    Scheduled Meds: . amiodarone  400 mg Oral Daily  . docusate sodium  100 mg Oral BID  . metoprolol tartrate  25 mg Oral BID  . polyethylene glycol  17 g Oral Daily  . potassium chloride  40 mEq Oral BID  . spironolactone  100 mg Oral Daily  . sulfamethoxazole-trimethoprim  1 tablet Oral Q12H  . torsemide  40 mg Oral Daily   Continuous Infusions:  PRN Meds: albuterol, benzonatate, lip balm, morphine injection   Vital Signs    Vitals:   09/03/18 2159 09/04/18 0557 09/04/18 2154 09/05/18 0426  BP: 105/63 102/66 (!) 90/55 95/70  Pulse: 72 69 66 68  Resp: 18  17 19   Temp: 97.9 F (36.6 C) 97.7 F (36.5 C) 98.1 F (36.7 C) 98.6 F (37 C)  TempSrc: Oral Oral Oral   SpO2: 95% 92% 93% 91%  Weight:      Height:        Intake/Output Summary (Last 24 hours) at 09/05/2018 1048 Last data filed at 09/05/2018 0630 Gross per 24 hour  Intake 2191 ml  Output 980 ml  Net 1211 ml   Filed Weights   08/30/18 0701 09/03/18 0500  Weight: 96.6 kg 98.2 kg    Telemetry    SR with a pacing  - Personally Reviewed  ECG    N/A  Physical Exam   GEN: Chronically ill appearing male in no acute distress.   Neck: No JVD Cardiac: RRR, no murmurs, rubs, or gallops.  Respiratory: Clear to auscultation bilaterally. GI: Soft, nontender, non-distended  MS: 1+ BL LE edema; No deformity. Neuro:  Nonfocal  Psych: Normal affect   Labs    Chemistry Recent Labs  Lab 08/31/18 1141 09/01/18 0602  09/02/18 0946 09/03/18 0608 09/04/18 0334 09/05/18 0414  NA 129* 131*   < > 128* 130* 130* 131*  K 4.5 4.6   < > 3.9 3.3* 3.4* 3.0*  CL 102 102   < > 98 95* 97* 100  CO2 22 22   < > 23 26 27 26   GLUCOSE 146* 93   < > 95 94 103* 99  BUN 17 14   < > 10  12 14 13   CREATININE 1.26* 1.03   < > 0.91 1.15 1.30* 1.32*  CALCIUM 7.8* 7.9*   < > 7.8* 7.5* 7.4* 7.4*  PROT 7.3 8.3*  --  7.7  --   --   --   ALBUMIN 1.5* 1.7*  --  1.7*  --   --   --   AST 33 32  --  36  --   --   --   ALT 16 16  --  15  --   --   --   ALKPHOS 71 80  --  82  --   --   --   BILITOT 4.7* 4.5*  --  3.0*  --   --   --   GFRNONAA 58* >60   < > >60 >60 56* 55*  GFRAA >60 >60   < > >60 >60 >60 >60  ANIONGAP 5 7   < > 7 9 6 5    < > = values in this interval not displayed.     Hematology  Recent Labs  Lab 09/03/18 0608 09/04/18 0334 09/05/18 0414  WBC 8.7 8.6 7.1  RBC 3.94* 3.70* 3.88*  HGB 12.5* 11.8* 12.4*  HCT 37.3* 34.9* 36.7*  MCV 94.7 94.3 94.6  MCH 31.7 31.9 32.0  MCHC 33.5 33.8 33.8  RDW 13.7 14.0 14.6  PLT 62* 60* 64*    Cardiac Enzymes Recent Labs  Lab 08/30/18 0705  TROPONINI <0.03   No results for input(s): TROPIPOC in the last 168 hours.   BNP Recent Labs  Lab 08/30/18 0705  BNP 133.0*     DDimer No results for input(s): DDIMER in the last 168 hours.   Radiology    Vas [place] Lower Extremity Venous (dvt)  Result Date: 09/04/2018  Lower Venous Study Indications: Edema.  Performing Technologist: Landry Mellow RDMS, RVT  Examination Guidelines: A complete evaluation includes B-mode imaging, spectral Doppler, color Doppler, and power Doppler as needed of all accessible portions of each vessel. Bilateral testing is considered an integral part of a complete examination. Limited examinations for reoccurring indications may be performed as noted.  Right Venous Findings: +---------+---------------+---------+-----------+----------+-------+          CompressibilityPhasicitySpontaneityPropertiesSummary +---------+---------------+---------+-----------+----------+-------+ CFV      Full           Yes      Yes                          +---------+---------------+---------+-----------+----------+-------+ SFJ      Full                                                  +---------+---------------+---------+-----------+----------+-------+ FV Prox  Full                                                 +---------+---------------+---------+-----------+----------+-------+ FV Mid   Full                                                 +---------+---------------+---------+-----------+----------+-------+ FV DistalFull                                                 +---------+---------------+---------+-----------+----------+-------+ PFV      Full                                                 +---------+---------------+---------+-----------+----------+-------+ POP      Full           Yes      Yes                          +---------+---------------+---------+-----------+----------+-------+ PTV      Full                                                 +---------+---------------+---------+-----------+----------+-------+  PERO     Full                                                 +---------+---------------+---------+-----------+----------+-------+  Left Venous Findings: +---+---------------+---------+-----------+----------+-------+    CompressibilityPhasicitySpontaneityPropertiesSummary +---+---------------+---------+-----------+----------+-------+ CFVFull           Yes      Yes                          +---+---------------+---------+-----------+----------+-------+    Summary: Right: There is no evidence of deep vein thrombosis in the lower extremity. No cystic structure found in the popliteal fossa. Left: No evidence of common femoral vein obstruction.  *See table(s) above for measurements and observations. Electronically signed by Monica Martinez MD on 09/04/2018 at 3:40:05 PM.    Final     Cardiac Studies   Echocardiogram 09/02/18 Study Conclusions - Left ventricle: The cavity size was normal. There was moderate concentric hypertrophy. Systolic function was vigorous. The estimated ejection  fraction was in the range of 65% to 70%. Wall motion was normal; there were no regional wall motion abnormalities. Left ventricular diastolic function parameters were normal. - Aortic valve: There was trivial regurgitation. - Aortic root: The aortic root was normal in size. - Ascending aorta: The ascending aorta was mildly dilated measuring 41 mm. - Mitral valve: There was mild regurgitation. - Left atrium: The atrium was moderately dilated. - Right ventricle: Systolic function was normal. - Right atrium: Pacer wire or catheter noted in right atrium. - Tricuspid valve: There was moderate regurgitation. - Pulmonic valve: There was no regurgitation. - Pulmonary arteries: Systolic pressure was mildly to moderately increased. PA peak pressure: 42 mm Hg (S). - Inferior vena cava: The vessel was normal in size. - Pericardium, extracardiac: There was no pericardial effusion. There was a left pleural effusion.  Patient Profile     67 y.o. 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   Assessment & Plan    1. PAF - Maintaining sinus rhythm on amiodarone. His last pacemaker interrogation (08/22/18) indicated 0.7% AT/AF. Continue amiodarone 400 mg>>  reduce to 200 mg daily after a week (today is day 3), hopefully stop it in a month or less. Not a candidate for anticoagulation due to co morbidities.   2. Chronic diastolic CHF - Echo showed normal LVEF. Volume over load more consistent with chronic liver disease than CHF.  - was on lasix 40mg   daily and losartan 25mg  daily at home.  - Here torsemide 40mg  daily and spirolactone 100mg  daily.   3. Hypokalemia - Despite high spironolactone and supplement.   [facility] will sign off.   Medication Recommendations:   MD to review diuretics. Continue amiodarone as above Other recommendations (labs, testing, etc): BMET daily and few days after discharge Follow up as an outpatient:  On palliative care, pending goals of care  For questions or updates, please contact [place] Please consult [email or web address] for contact info under        SignedLeanor Kail, PA  09/05/2018, 10:48  AM    I have seen and examined the patient along with Leanor Kail, PA .  I have reviewed the chart, notes and new data.  I agree with PA/NP's note.  Key new complaints: sleepier today Key examination changes: trivial leg edema, no JVD, ascites Key new findings / data: no arrhythmia, K 3.0  PLAN: Reduce torsemide. Replace K. Restart lactulose. (may be developing some hepatic encephalopathy with diuretics and hypokalemia). Will reduce the amiodarone dose earlier than planned since arrhythmia is so well suppressed.  Sanda Klein, MD, [place] [phone] 09/05/2018, 11:22 AM

## 2018-09-05 NOTE — Plan of Care (Signed)

## 2018-09-05 NOTE — Progress Notes (Addendum)
CALL PAGER [phone] for any questions or notifications regarding this patient   FMTS Attending Daily Note: Hunter Singh, MD  Pager [phone]  Office [phone] I have seen and examined this patient, reviewed their chart. I have discussed this patient with the resident. I agree with the resident's findings, assessment and care plan.My edits are below in bold.    Family Medicine Teaching Service Daily Progress Note Intern Pager: [phone]  Patient name: Hunter Carter Medical record number: [id number] Date of birth: 11/27/1950 Age: 67 y.o. Gender: male  Primary Care Provider: Nuala Alpha, DO Consultants:Surgery, IR Code Status:DNR  Pt Overview and Major Events to Date: 11/16 admitted with sepsis 2/2 cholecystitis, perc cholecystostomy tube placed by IR 11/17continued abd pain  Assessment and Plan: Hunter Carter a 67 y.o.male who presented with sepsis secondary to cholecystitis s/p percutaneous cholecystostomy tube, sepsis now resolved. Fluid around cholecystostomy and ascitic fluid grew Enterococcus.  PMH is significant forHTN, CAD s/p CABG in 2004 with pacemaker in place, paroxysmal atrial tachycardia, liver cirrhosis 2/2 chronic alcohol abuse and treated Hep C with previous hepatic encephalopathy, chronic diastolic CHF, and paroxysmal atrial fibrillation.   Sepsis 2/2 acute cholecystitis, improving:s/p perc tube 11/16, WBC 12.5 >11.2> 9.4> 8.7>8.6>7.1on 11/22.Bilious/peritoneal fluidculturegrowing moderate Enterobacter.Tmax 97.9*F, Vitals stable.Paracentesis was performed 11/19 producing cloudy amber-colored fluid with 16,000 white blood cells, 77% neutrophils, lactate dehydrogenase of 213, without organisms seen on Gram stain. -IRConsulted-Continuing to follow. -Started Bactrim 11/20 through November 29. - Discussed plan of care with outpatient Hepatology (Dr. Lindell Carter spoke with NP at clinic).  -Zofran PRN nausea -Daily CBC, CMP  -IV NS  stopped -PT/OT to see 11/21  Cirrhosis, secondary to EtOH and HepC.  - Torsemide reduced due to mild AKI, low K. - Spironolactone. - Restart lactulose. - Palliative consulted given elevated MELD, discussed with family.    Lower Extremity Swelling: most likely related to cirrhosis.  -Torsemide 20  Chronic HFpEFstable: XBD53/29 showing L pleural effusion. Also with 1+ bilateral LE edema and crackles on lung exam.Follows with Dr. Nash Carter 11/22 98.2*F. -IV fluids KVO'ed 11/19 -Metoprolol 25mg  daily -Strict I&Os -Fluid restriction -Weight today. -Torsemide 20 starting 11/23 -Holding home losartan & statin -Elevate legs, compression stockings.  Paroxysmal ATand atrial fibwith pacemaker,stable: Follows with Dr. Loletha Carter outpatient.Had A.fib with RVR for ~4 hours 11/20 and overnight. Echo 11/19 shows LV with 65-70% EF and normal wall motion. HR ON in 70s 11/21. -Cardiac monitoring -Amiodaronedosing per Cardiology, appreciate input.  -Metoprolol 25mg  BID   SOB with dry cough:CXR11/19 showing L pleural effusion. Also with2+ bilateral LE edema and crackles on lung exam 11/20. -Monitor symptoms -Tessalon PRN cough -Torsemide 20mg  daily -Nurse ambulate with O2 -Spirometer -2 view CXR if continued SOB -Treatment for gallbladder as above -Albuterol nebulizer every 4 hours as needed for wheezing -IV fluids stopped - Spirometry   AKI, stable:Cr 1.47 >1.03>0.91>1.30>1.3211/22.Baseline around 0.9. Likely in the setting of poor perfusion secondary to acute sepsis.  -Avoid nephrotoxic medications as possible  -daily BMP   CAD s/p CABG in 2004, stable:EKG without ischemic changes, troponin <0.03.  -Hold home statin, not on secondary prevention with aspirin daily given previous GI bleed and severe liver cirrhosis.  FEN/GI:Fluids stopped;Regular diet,  colace/miralax JME:QASTMHD subq given platelet level >50,000   Disposition:SNF   Subjective:  Patient is seen  this morning sitting upright in bed with his legs elevated.  Patient states he does not much care to wear the compression stockings, but I persuaded him that it would help decrease the swelling in his legs.  Patient's family on the phone states they do not feel comfortable bringing him home with them and are asking if there is an opportunity for rehab.  He reports having minimal abdominal pain.  Objective: Temp:  [98.1 F (36.7 C)-98.6 F (37 C)] 98.6 F (37 C) (11/22 0426) Pulse Rate:  [66-68] 68 (11/22 0426) Resp:  [17-19] 19 (11/22 0426) BP: (90-95)/(55-70) 95/70 (11/22 0426) SpO2:  [91 %-93 %] 91 % (11/22 0426)  Physical Exam  Constitutional: He is oriented to person, place, and time. He appears well-developed and well-nourished. He appears ill.  Cardiovascular: Normal rate, regular rhythm and normal heart sounds.  Pulmonary/Chest: Effort normal and breath sounds normal.  Abdominal: Bowel sounds are normal. He exhibits distension. There is tenderness (mild to palpation). There is no rebound.  Musculoskeletal: He exhibits edema (2+ pitting in bilateral lower extremities).  Neurological: He is alert and oriented to person, place, and time.  Skin: Skin is warm and dry. Capillary refill takes less than 2 seconds.  Psychiatric: He has a normal mood and affect.   Laboratory: Recent Labs  Lab 09/03/18 0608 09/04/18 0334 09/05/18 0414  WBC 8.7 8.6 7.1  HGB 12.5* 11.8* 12.4*  HCT 37.3* 34.9* 36.7*  PLT 62* 60* 64*   Recent Labs  Lab 08/31/18 1141 09/01/18 0602  09/02/18 0946 09/03/18 0608 09/04/18 0334 09/05/18 0414  NA 129* 131*   < > 128* 130* 130* 131*  K 4.5 4.6   < > 3.9 3.3* 3.4* 3.0*  CL 102 102   < > 98 95* 97* 100  CO2 22 22   < > 23 26 27 26   BUN 17 14   < > 10 12 14 13   CREATININE 1.26* 1.03   < > 0.91 1.15 1.30* 1.32*  CALCIUM 7.8* 7.9*   < > 7.8* 7.5* 7.4* 7.4*  PROT 7.3 8.3*  --  7.7  --   --   --   BILITOT 4.7* 4.5*  --  3.0*  --   --   --   ALKPHOS 71 80   --  82  --   --   --   ALT 16 16  --  15  --   --   --   AST 33 32  --  36  --   --   --   GLUCOSE 146* 93   < > 95 94 103* 99   < > = values in this interval not displayed.   Imaging/Diagnostic Tests: Dg Abd 1 View  Result Date: 08/31/2018 CLINICAL DATA:  Sepsis EXAM: ABDOMEN - 1 VIEW COMPARISON:  08/30/2018 FINDINGS: Scattered large and small bowel gas is noted. Contrast material is noted within the bladder from previous CT examination. Multiple gallstones and cholecystostomy tube are noted in the right upper quadrant. No free air is seen. Degenerative changes of lumbar spine are noted. IMPRESSION: Cholelithiasis and cholecystostomy tube. No acute abnormality noted. Electronically Signed   By: Inez Catalina M.D.   On: 08/31/2018 09:36   Ct Angio Chest Pe W Or Wo Contrast  Result Date: 08/30/2018 CLINICAL DATA:  Central abdominal pain and pressure since last night. History of myocardial infarction and cirrhosis. EXAM: CT ANGIOGRAPHY CHEST CT ABDOMEN AND PELVIS WITH CONTRAST TECHNIQUE: Multidetector CT imaging of the chest was performed using the standard protocol during bolus administration of intravenous contrast. Multiplanar CT image reconstructions and MIPs were obtained to evaluate the vascular anatomy. Multidetector CT imaging of the abdomen and pelvis was performed  using the standard protocol during bolus administration of intravenous contrast. CONTRAST:  162mL ISOVUE-370 IOPAMIDOL (ISOVUE-370) INJECTION 76% COMPARISON:  Abdominopelvic CT 07/20/2018. FINDINGS: CTA CHEST FINDINGS Cardiovascular: The pulmonary arteries are well opacified with contrast to the level of the subsegmental branches. There is no evidence of acute pulmonary embolism. There is diffuse atherosclerosis of the aorta, great vessels and coronary arteries status post median sternotomy and CABG. Left subclavian pacemaker leads extend into the right atrium and right ventricle. The heart is mildly enlarged. There is no pericardial  effusion. Mediastinum/Nodes: There are no enlarged mediastinal, hilar or axillary lymph nodes. There is a small hiatal hernia. Distal esophageal varices are again noted. The trachea and thyroid gland appear unremarkable. Lungs/Pleura: Trace bilateral pleural effusions. There is no pneumothorax. Mild dependent atelectasis at both lung bases. No confluent airspace opacity or suspicious pulmonary nodule. Musculoskeletal/Chest wall: There is no chest wall mass or suspicious osseous finding. Review of the MIP images confirms the above findings. CT ABDOMEN and PELVIS FINDINGS Hepatobiliary: There are diffuse morphologic changes of cirrhosis. No focal hepatic lesion or abnormal enhancement identified. There is a large recanalized left paraumbilical vein. The portal vein is patent. There are multiple calcified gallstones. There is new diffuse gallbladder wall thickening with pericholecystic fluid inferiorly, highly suspicious for acute cholecystitis. No significant biliary dilatation demonstrated. Pancreas: Unremarkable. No pancreatic ductal dilatation or surrounding inflammatory changes. Spleen: Normal in size without focal abnormality. Adrenals/Urinary Tract: Both adrenal glands appear normal. There are tiny low-density renal lesions which are likely cysts. No evidence of urinary tract calculus or hydronephrosis. The bladder appears normal. Stomach/Bowel: The stomach and small bowel demonstrate no significant findings. There is mild wall thickening throughout the proximal colon, likely related to the patient's underlying liver disease. No focal bowel wall thickening, significant distention or surrounding inflammation identified. Mild sigmoid diverticulosis. Vascular/Lymphatic: Mildly prominent lymph nodes in the porta hepatis are stable. There is moderate diffuse aortic and branch vessel atherosclerosis. As above, there is a recanalized paraumbilical vein with multiple large venous collaterals in the anterior abdominal  wall and distal esophageal varices. No acute vascular findings are seen. Reproductive: There stable asymmetric calcifications within the left prostate lobe. The seminal vesicles appear normal. Other: There is a small amount of ascites around the liver and in the pelvis. There is mild diffuse mesenteric edema. No focal extraluminal fluid collections are seen. Musculoskeletal: No acute or significant osseous findings. Lower lumbar spondylosis, similar to previous examination. IMPRESSION: 1. New extensive gallbladder wall thickening and pericholecystic fluid highly suspicious for acute cholecystitis. There are multiple underlying calcified gallstones. No significant biliary dilatation. 2. Cirrhosis and portal hypertension with multiple varices, similar to previous study. 3. Ascites and diffuse mesenteric edema. 4. No acute vascular findings are identified. There is no evidence of acute pulmonary embolism. Aortic Atherosclerosis (ICD10-I70.0). 5. Preliminary results have been discussed with Dr. Ellender Hose. Electronically Signed   By: Richardean Sale M.D.   On: 08/30/2018 10:53   Ct Abdomen Pelvis W Contrast  Result Date: 09/01/2018 CLINICAL DATA:  67 year old male with abdominal stenting following drain placement. Cannot tolerate p.o. Subsequent encounter. EXAM: CT ABDOMEN AND PELVIS WITH CONTRAST TECHNIQUE: Multidetector CT imaging of the abdomen and pelvis was performed using the standard protocol following bolus administration of intravenous contrast. CONTRAST:  121mL OMNIPAQUE IOHEXOL 300 MG/ML  SOLN COMPARISON:  08/30/2018 CT. FINDINGS: Lower chest: Slight increase in size of small bilateral pleural effusions and basilar atelectasis. Pacemaker in place. Heart slightly enlarged. Coronary artery calcifications. Hepatobiliary: Cirrhotic liver. No  dominant focal liver lesion. Recanalization umbilical vein. Portal vein remains patent. Percutaneous cholecystostomy has been performed in the interim. Decrease in size of  inflamed gallbladder (which contains multiple stones). Decrease in size, although incomplete resolution, of complex gallbladder wall thickening. There remains mild inflammation of the superior margin of the adjacent right colon although mass effect has decreased in the interim. Inflammatory process approaches but does not compress the adjacent duodenum. Pancreas: No worrisome pancreatic mass. Spleen: Spleen of normal size without focal lesion. Adrenals/Urinary Tract: No obstructing stone or hydronephrosis. Tiny renal low-density structures too small to characterize statistically likely cysts. No adrenal mass. Noncontrast filled imaging urinary bladder without abnormality. Stomach/Bowel: Distal esophagus narrowing by prominent varix. Scattered colonic diverticula. Evaluation for detection of primary stomach/bowel inflammatory process limited by ascites and under distension. Vascular/Lymphatic: Atherosclerotic changes aorta and aortic branch vessels. No abdominal aortic aneurysm. No large vessel occlusion. Ectatic iliac arteries with right common iliac artery measuring 1.9 cm and left common iliac artery 1.8 cm. Upper abdominal adenopathy greatest porta hepatis and peripancreatic region may be reactive in origin. Reproductive: Slightly lobulated calcified prostate gland impresses upon the bladder base. Other: Ascites minimally changed from prior exam. No free intraperitoneal air. Third spacing of fluid. Musculoskeletal: Degenerative changes lower lumbar spine most notable L5-S1. Bilateral hip joint degenerative changes. IMPRESSION: 1. Percutaneous cholecystostomy has been performed in the interim. Decrease in size of inflamed gallbladder (which contains multiple stones). Decrease in size, although incomplete resolution, of complex gallbladder wall thickening. There remains mild inflammation of the superior margin of the adjacent right colon although mass effect has decreased in the interim. Inflammatory process  approaches but does not compress the adjacent duodenum. 2. Slight increase in small pleural effusions and basilar atelectasis. 3. Ascites without significant change.  Third spacing of fluid. 4. Distal esophagus narrowing by prominent varix. 5. Scattered colonic diverticula. Evaluation for detection of primary stomach/bowel inflammatory process limited by ascites and under distension. 6. Cirrhosis with portal hypertension.  Surrounding varices. 7. Upper abdominal adenopathy may be reactive in origin. 8.  Aortic Atherosclerosis (ICD10-I70.0). 9. Slightly lobulated calcified prostate gland with mild impression upon the bladder base. Electronically Signed   By: Genia Del M.D.   On: 09/01/2018 16:33   Ct Abdomen Pelvis W Contrast  Result Date: 08/30/2018 CLINICAL DATA:  Central abdominal pain and pressure since last night. History of myocardial infarction and cirrhosis. EXAM: CT ANGIOGRAPHY CHEST CT ABDOMEN AND PELVIS WITH CONTRAST TECHNIQUE: Multidetector CT imaging of the chest was performed using the standard protocol during bolus administration of intravenous contrast. Multiplanar CT image reconstructions and MIPs were obtained to evaluate the vascular anatomy. Multidetector CT imaging of the abdomen and pelvis was performed using the standard protocol during bolus administration of intravenous contrast. CONTRAST:  154mL ISOVUE-370 IOPAMIDOL (ISOVUE-370) INJECTION 76% COMPARISON:  Abdominopelvic CT 07/20/2018. FINDINGS: CTA CHEST FINDINGS Cardiovascular: The pulmonary arteries are well opacified with contrast to the level of the subsegmental branches. There is no evidence of acute pulmonary embolism. There is diffuse atherosclerosis of the aorta, great vessels and coronary arteries status post median sternotomy and CABG. Left subclavian pacemaker leads extend into the right atrium and right ventricle. The heart is mildly enlarged. There is no pericardial effusion. Mediastinum/Nodes: There are no enlarged  mediastinal, hilar or axillary lymph nodes. There is a small hiatal hernia. Distal esophageal varices are again noted. The trachea and thyroid gland appear unremarkable. Lungs/Pleura: Trace bilateral pleural effusions. There is no pneumothorax. Mild dependent atelectasis at both lung bases.  No confluent airspace opacity or suspicious pulmonary nodule. Musculoskeletal/Chest wall: There is no chest wall mass or suspicious osseous finding. Review of the MIP images confirms the above findings. CT ABDOMEN and PELVIS FINDINGS Hepatobiliary: There are diffuse morphologic changes of cirrhosis. No focal hepatic lesion or abnormal enhancement identified. There is a large recanalized left paraumbilical vein. The portal vein is patent. There are multiple calcified gallstones. There is new diffuse gallbladder wall thickening with pericholecystic fluid inferiorly, highly suspicious for acute cholecystitis. No significant biliary dilatation demonstrated. Pancreas: Unremarkable. No pancreatic ductal dilatation or surrounding inflammatory changes. Spleen: Normal in size without focal abnormality. Adrenals/Urinary Tract: Both adrenal glands appear normal. There are tiny low-density renal lesions which are likely cysts. No evidence of urinary tract calculus or hydronephrosis. The bladder appears normal. Stomach/Bowel: The stomach and small bowel demonstrate no significant findings. There is mild wall thickening throughout the proximal colon, likely related to the patient's underlying liver disease. No focal bowel wall thickening, significant distention or surrounding inflammation identified. Mild sigmoid diverticulosis. Vascular/Lymphatic: Mildly prominent lymph nodes in the porta hepatis are stable. There is moderate diffuse aortic and branch vessel atherosclerosis. As above, there is a recanalized paraumbilical vein with multiple large venous collaterals in the anterior abdominal wall and distal esophageal varices. No acute vascular  findings are seen. Reproductive: There stable asymmetric calcifications within the left prostate lobe. The seminal vesicles appear normal. Other: There is a small amount of ascites around the liver and in the pelvis. There is mild diffuse mesenteric edema. No focal extraluminal fluid collections are seen. Musculoskeletal: No acute or significant osseous findings. Lower lumbar spondylosis, similar to previous examination. IMPRESSION: 1. New extensive gallbladder wall thickening and pericholecystic fluid highly suspicious for acute cholecystitis. There are multiple underlying calcified gallstones. No significant biliary dilatation. 2. Cirrhosis and portal hypertension with multiple varices, similar to previous study. 3. Ascites and diffuse mesenteric edema. 4. No acute vascular findings are identified. There is no evidence of acute pulmonary embolism. Aortic Atherosclerosis (ICD10-I70.0). 5. Preliminary results have been discussed with Dr. Ellender Hose. Electronically Signed   By: Richardean Sale M.D.   On: 08/30/2018 10:53   Ir Perc Cholecystostomy  Result Date: 08/30/2018 INDICATION: 67 year old with acute cholecystitis, sepsis and end-stage liver disease. Patient is not a surgical candidate due to his liver disease. Plan for percutaneous cholecystostomy tube placement. Patient was given FFP prior to the procedure. EXAM: IMAGE GUIDED PERCUTANEOUS CHOLECYSTOSTOMY TUBE PLACEMENT ULTRASOUND-GUIDED PARACENTESIS MEDICATIONS: Patient received IV antibiotics in the emergency department prior to the procedure. ANESTHESIA/SEDATION: Moderate (conscious) sedation was employed during this procedure. A total of Versed 1.5 mg and Fentanyl 75 mcg was administered intravenously. Moderate Sedation Time: 13 minutes. The patient's level of consciousness and vital signs were monitored continuously by radiology nursing throughout the procedure under my direct supervision. FLUOROSCOPY TIME:  Fluoroscopy Time: 42 seconds, 9 mGy  COMPLICATIONS: None immediate. PROCEDURE: Informed written consent was obtained from the patient after a thorough discussion of the procedural risks, benefits and alternatives. All questions were addressed. A timeout was performed prior to the initiation of the procedure. Patient was placed supine on the interventional table. Gallbladder and liver was evaluated with ultrasound. Right abdomen was prepped and draped in sterile fashion. Maximal barrier sterile technique was utilized including caps, mask, sterile gowns, sterile gloves, sterile drape, hand hygiene and skin antiseptic. Skin was anesthetized with 1% lidocaine. Due to the perihepatic ascites, a Safe-T-Centesis catheter was directed in the perihepatic space with ultrasound guidance. 300 mL of cloudy amber  colored fluid was removed. Attention was directed to the cholecystostomy tube placement. After the paracentesis was complete, the skin was anesthetized with 1% lidocaine near the gallbladder. 21 gauge needle was directed into the gallbladder fundus with ultrasound guidance. 0.018 wire was advanced into the gallbladder. The tract was dilated with an Accustick set. A J wire was advanced into the gallbladder and the tract was dilated to accommodate a 10.2 [place] multipurpose drain. 90 mL of clay colored purulent looking fluid was removed from the gallbladder. The gallbladder was decompressed based on ultrasound at the end of the procedure. Catheter was sutured to skin and attached to a gravity bag. Paracentesis catheter was also removed at the end of the procedure. FINDINGS: Initial ultrasound images demonstrate small to moderate amount of perihepatic ascites and fluid around the gallbladder. Therefore, a paracentesis was performed and majority of the perihepatic fluid was removed. 300 mL of cloudy amber colored fluid removed from the perihepatic space. The fluid appearance was concerning for infection. Gallbladder wall was markedly thickened. Multiple  echogenic stones. Catheter was successfully advanced into the gallbladder and the gallbladder was decompressed. 90 mL of clay colored purulent looking fluid was removed from the gallbladder. Gallbladder was decompressed at end of the procedure. IMPRESSION: Successful image guided percutaneous cholecystostomy tube placement. In addition, 300 mL perihepatic ascites was removed during the procedure. Appearance of the peritoneal fluid is concerning for infection and underlying gallbladder perforation. Gallbladder fluid and peritoneal fluid was sent for culture. Electronically Signed   By: Markus Daft M.D.   On: 08/30/2018 17:05   Dg Chest Port 1 View  Result Date: 09/02/2018 CLINICAL DATA:  Cough, shortness of breath, recent kidney surgery, history coronary artery disease, CHF, cirrhosis, ethanol abuse, former smoker EXAM: PORTABLE CHEST 1 VIEW COMPARISON:  Portable exam 0652 hours compared to 08/30/2018 FINDINGS: LEFT subclavian AICD leads project at over RIGHT atrium and RIGHT ventricle. Enlargement of cardiac silhouette post CABG. Mediastinal contours and pulmonary vascularity normal. Bibasilar atelectasis greater on LEFT with small LEFT pleural effusion. Upper lungs clear. No pneumothorax. IMPRESSION: Bibasilar atelectasis LEFT greater than RIGHT with small LEFT pleural effusion. Enlargement of cardiac silhouette post CABG and ICD. Electronically Signed   By: Lavonia Dana M.D.   On: 09/02/2018 09:06   Dg Chest Portable 1 View  Result Date: 08/30/2018 CLINICAL DATA:  Abdominal pain with nausea, vomiting and cough for 3 weeks. History congestive heart failure and pacemaker. EXAM: PORTABLE CHEST 1 VIEW COMPARISON:  03/20/2018 and 09/07/2017 radiographs. FINDINGS: 0732 hour. The left subclavian pacemaker leads appear unchanged within the right atrium and right ventricle. The heart size is stable status post median sternotomy and CABG. The lungs remain clear. There is no pleural effusion or pneumothorax. The  bones appear unchanged. Telemetry leads overlie the chest. IMPRESSION: Stable chest post CABG.  No active cardiopulmonary process.   Dg Abd Portable 1v  Result Date: 09/01/2018 CLINICAL DATA:  Three-week history of lower abdominal pain. The patient reports the abdomen is distended and firm on the lower left. No bowel habit changes or nausea or vomiting. History of gastric ulcer disease with bleeding, percutaneous cholecystostomy on August 30, 2018 and paracentesis on the same day. EXAM: PORTABLE ABDOMEN - 1 VIEW COMPARISON:  Abdominal radiographs of August 31, 2018. FINDINGS: The bowel gas pattern is within the limits of normal. Multiple gallstones are noted in the right upper quadrant with a cholecystostomy tube present and stable. No bony abnormalities are observed. IMPRESSION: Fairly normal appearing bowel gas pattern. Known  gallstones and cholecystostomy tube. No significant change since the previous study.   Vas [place] Lower Extremity Venous (dvt)  Result Date: 09/04/2018  Lower Venous Study Indications: Edema.  Performing Technologist: Landry Mellow RDMS, RVT  Examination Guidelines: A complete evaluation includes B-mode imaging, spectral Doppler, color Doppler, and power Doppler as needed of all accessible portions of each vessel. Bilateral testing is considered an integral part of a complete examination. Limited examinations for reoccurring indications may be performed as noted.   Summary: Right: There is no evidence of deep vein thrombosis in the lower extremity. No cystic structure found in the popliteal fossa. Left: No evidence of common femoral vein obstruction.  *See table(s) above for measurements and observations  Ir Paracentesis  Result Date: 09/02/2018 INDICATION: Patient with acute cholecystitis status post percutaneous cholecystostomy tube. Abdominal distention and discomfort. Underlying history of cirrhosis. Ascites. Request for diagnostic and therapeutic paracentesis. EXAM:  ULTRASOUND GUIDED LEFT LOWER QUADRANT PARACENTESIS MEDICATIONS: None. COMPLICATIONS: None immediate. PROCEDURE: Informed written consent was obtained from the patient after a discussion of the risks, benefits and alternatives to treatment. A timeout was performed prior to the initiation of the procedure. Initial ultrasound scanning demonstrates a small to moderate amount of ascites within the left lower abdominal quadrant. The left lower abdomen was prepped and draped in the usual sterile fashion. 1% lidocaine with epinephrine was used for local anesthesia. Following this, a 19 gauge, 7-cm, Yueh catheter was introduced. An ultrasound image was saved for documentation purposes. The paracentesis was performed. The catheter was removed and a dressing was applied. The patient tolerated the procedure well without immediate post procedural complication. FINDINGS: A total of approximately 470 mL of cloudy, amber/red fluid was removed. Samples were sent to the laboratory as requested by the clinical team. IMPRESSION: Successful ultrasound-guided paracentesis yielding 470 mL of peritoneal fluid. Read by: Ascencion Dike PA-C Electronically Signed   By: Aletta Edouard M.D.   On: 09/02/2018 11:24   Ir Paracentesis  Result Date: 08/31/2018 INDICATION: 67 year old with acute cholecystitis, sepsis and end-stage liver disease. Patient is not a surgical candidate due to his liver disease. Plan for percutaneous cholecystostomy tube placement. Patient was given FFP prior to the procedure. EXAM: IMAGE GUIDED PERCUTANEOUS CHOLECYSTOSTOMY TUBE PLACEMENT ULTRASOUND-GUIDED PARACENTESIS MEDICATIONS: Patient received IV antibiotics in the emergency department prior to the procedure. ANESTHESIA/SEDATION: Moderate (conscious) sedation was employed during this procedure. A total of Versed 1.5 mg and Fentanyl 75 mcg was administered intravenously. Moderate Sedation Time: 13 minutes. The patient's level of consciousness and vital signs were  monitored continuously by radiology nursing throughout the procedure under my direct supervision. FLUOROSCOPY TIME:  Fluoroscopy Time: 42 seconds, 9 mGy COMPLICATIONS: None immediate. PROCEDURE: Informed written consent was obtained from the patient after a thorough discussion of the procedural risks, benefits and alternatives. All questions were addressed. A timeout was performed prior to the initiation of the procedure. Patient was placed supine on the interventional table. Gallbladder and liver was evaluated with ultrasound. Right abdomen was prepped and draped in sterile fashion. Maximal barrier sterile technique was utilized including caps, mask, sterile gowns, sterile gloves, sterile drape, hand hygiene and skin antiseptic. Skin was anesthetized with 1% lidocaine. Due to the perihepatic ascites, a Safe-T-Centesis catheter was directed in the perihepatic space with ultrasound guidance. 300 mL of cloudy amber colored fluid was removed. Attention was directed to the cholecystostomy tube placement. After the paracentesis was complete, the skin was anesthetized with 1% lidocaine near the gallbladder. 21 gauge needle was directed into the  gallbladder fundus with ultrasound guidance. 0.018 wire was advanced into the gallbladder. The tract was dilated with an Accustick set. A J wire was advanced into the gallbladder and the tract was dilated to accommodate a 10.2 [place] multipurpose drain. 90 mL of clay colored purulent looking fluid was removed from the gallbladder. The gallbladder was decompressed based on ultrasound at the end of the procedure. Catheter was sutured to skin and attached to a gravity bag. Paracentesis catheter was also removed at the end of the procedure. FINDINGS: Initial ultrasound images demonstrate small to moderate amount of perihepatic ascites and fluid around the gallbladder. Therefore, a paracentesis was performed and majority of the perihepatic fluid was removed. 300 mL of cloudy amber  colored fluid removed from the perihepatic space. The fluid appearance was concerning for infection. Gallbladder wall was markedly thickened. Multiple echogenic stones. Catheter was successfully advanced into the gallbladder and the gallbladder was decompressed. 90 mL of clay colored purulent looking fluid was removed from the gallbladder. Gallbladder was decompressed at end of the procedure. IMPRESSION: Successful image guided percutaneous cholecystostomy tube placement. In addition, 300 mL perihepatic ascites was removed during the procedure. Appearance of the peritoneal fluid is concerning for infection and underlying gallbladder perforation. Gallbladder fluid and peritoneal fluid was sent for culture. Electronically Signed   By: Markus Daft M.D.   On: 08/30/2018 17:05    Daisy Floro, DO 09/05/2018, 9:42 AM PGY-1, [place] Intern pager: [phone], text pages welcome

## 2018-09-05 NOTE — Progress Notes (Signed)
Referring Physician(s): Dr Jonny Ruiz  Supervising Physician: Daryll Brod  Patient Status:  [facility] - In-pt  Chief Complaint:  Perc chole drain placed 11/16 in IR  Subjective:  Perc chole drain Functioning well Pt up in chair - better Drain to remain for 6 weeks - or plan per [facility]  Allergies: Patient has no known allergies.  Medications: Prior to Admission medications   Medication Sig Start Date End Date Taking? Authorizing Provider  furosemide (LASIX) 40 MG tablet TAKE 1 TABLET BY MOUTH  DAILY AS NEEDED Patient taking differently: Take 40 mg by mouth daily. Make take an additional 40 mg if needed 07/15/18  Yes Lockamy, Timothy, DO  lactulose (CHRONULAC) 10 GM/15ML solution Take 45 mLs (30 g total) by mouth 2 (two) times daily. Titrate for 3-4 BM's/ day Patient taking differently: Take 30 g by mouth 3 (three) times daily. Titrate for 3-4 BM's/ day 03/22/18  Yes Sheikh, Omair Latif, DO  losartan (COZAAR) 25 MG tablet TAKE 1 TABLET BY MOUTH  DAILY Patient taking differently: Take 25 mg by mouth daily.  04/30/18  Yes Lockamy, Timothy, DO  metoprolol tartrate (LOPRESSOR) 50 MG tablet TAKE 1 TABLET BY MOUTH TWO  TIMES DAILY Patient taking differently: Take 50 mg by mouth daily.  07/15/18  Yes Lockamy, Timothy, DO  omeprazole (PRILOSEC) 20 MG capsule TAKE 1 CAPSULE BY MOUTH  DAILY Patient taking differently: Take 20 mg by mouth daily.  07/15/18  Yes Lockamy, Timothy, DO  PROAIR HFA 108 (90 Base) MCG/ACT inhaler USE 2 PUFFS BY MOUTH EVERY  4 HOURS AS NEEDED FOR  WHEEZING OR SHORTNESS OF  BREATH (OR COUGHING) Patient taking differently: Inhale 1-2 puffs into the lungs every 4 (four) hours as needed for wheezing.  06/20/18  Yes Lockamy, Timothy, DO  simvastatin (ZOCOR) 20 MG tablet TAKE 1 TABLET BY MOUTH  DAILY Patient taking differently: Take 20 mg by mouth daily at 6 PM.  07/15/18  Yes Lockamy, Timothy, DO     Vital Signs: BP 95/70 (BP Location: Left Arm)   Pulse 68   Temp 98.6 F (37 C)    Resp 19   Ht 5\' 6"  (1.676 m)   Wt 216 lb 7.9 oz (98.2 kg)   SpO2 91%   BMI 34.94 kg/m   Physical Exam  Abdominal: There is tenderness.  Skin: Skin is warm and dry.  Site is clean and dry NT no bleeding OP bile 20 cc in bag ENTEROBACTER SPECIES   Vitals reviewed.   Imaging: Ct Abdomen Pelvis W Contrast  Result Date: 09/01/2018 CLINICAL DATA:  67 year old male with abdominal stenting following drain placement. Cannot tolerate p.o. Subsequent encounter. EXAM: CT ABDOMEN AND PELVIS WITH CONTRAST TECHNIQUE: Multidetector CT imaging of the abdomen and pelvis was performed using the standard protocol following bolus administration of intravenous contrast. CONTRAST:  153mL OMNIPAQUE IOHEXOL 300 MG/ML  SOLN COMPARISON:  08/30/2018 CT. FINDINGS: Lower chest: Slight increase in size of small bilateral pleural effusions and basilar atelectasis. Pacemaker in place. Heart slightly enlarged. Coronary artery calcifications. Hepatobiliary: Cirrhotic liver. No dominant focal liver lesion. Recanalization umbilical vein. Portal vein remains patent. Percutaneous cholecystostomy has been performed in the interim. Decrease in size of inflamed gallbladder (which contains multiple stones). Decrease in size, although incomplete resolution, of complex gallbladder wall thickening. There remains mild inflammation of the superior margin of the adjacent right colon although mass effect has decreased in the interim. Inflammatory process approaches but does not compress the adjacent duodenum. Pancreas: No worrisome  pancreatic mass. Spleen: Spleen of normal size without focal lesion. Adrenals/Urinary Tract: No obstructing stone or hydronephrosis. Tiny renal low-density structures too small to characterize statistically likely cysts. No adrenal mass. Noncontrast filled imaging urinary bladder without abnormality. Stomach/Bowel: Distal esophagus narrowing by prominent varix. Scattered colonic diverticula. Evaluation for  detection of primary stomach/bowel inflammatory process limited by ascites and under distension. Vascular/Lymphatic: Atherosclerotic changes aorta and aortic branch vessels. No abdominal aortic aneurysm. No large vessel occlusion. Ectatic iliac arteries with right common iliac artery measuring 1.9 cm and left common iliac artery 1.8 cm. Upper abdominal adenopathy greatest porta hepatis and peripancreatic region may be reactive in origin. Reproductive: Slightly lobulated calcified prostate gland impresses upon the bladder base. Other: Ascites minimally changed from prior exam. No free intraperitoneal air. Third spacing of fluid. Musculoskeletal: Degenerative changes lower lumbar spine most notable L5-S1. Bilateral hip joint degenerative changes. IMPRESSION: 1. Percutaneous cholecystostomy has been performed in the interim. Decrease in size of inflamed gallbladder (which contains multiple stones). Decrease in size, although incomplete resolution, of complex gallbladder wall thickening. There remains mild inflammation of the superior margin of the adjacent right colon although mass effect has decreased in the interim. Inflammatory process approaches but does not compress the adjacent duodenum. 2. Slight increase in small pleural effusions and basilar atelectasis. 3. Ascites without significant change.  Third spacing of fluid. 4. Distal esophagus narrowing by prominent varix. 5. Scattered colonic diverticula. Evaluation for detection of primary stomach/bowel inflammatory process limited by ascites and under distension. 6. Cirrhosis with portal hypertension.  Surrounding varices. 7. Upper abdominal adenopathy may be reactive in origin. 8.  Aortic Atherosclerosis (ICD10-I70.0). 9. Slightly lobulated calcified prostate gland with mild impression upon the bladder base. Electronically Signed   By: Genia Del M.D.   On: 09/01/2018 16:33   Dg Chest Port 1 View  Result Date: 09/02/2018 CLINICAL DATA:  Cough, shortness  of breath, recent kidney surgery, history coronary artery disease, CHF, cirrhosis, ethanol abuse, former smoker EXAM: PORTABLE CHEST 1 VIEW COMPARISON:  Portable exam 0652 hours compared to 08/30/2018 FINDINGS: LEFT subclavian AICD leads project at over RIGHT atrium and RIGHT ventricle. Enlargement of cardiac silhouette post CABG. Mediastinal contours and pulmonary vascularity normal. Bibasilar atelectasis greater on LEFT with small LEFT pleural effusion. Upper lungs clear. No pneumothorax. IMPRESSION: Bibasilar atelectasis LEFT greater than RIGHT with small LEFT pleural effusion. Enlargement of cardiac silhouette post CABG and ICD. Electronically Signed   By: Lavonia Dana M.D.   On: 09/02/2018 09:06   Vas [place] Lower Extremity Venous (dvt)  Result Date: 09/04/2018  Lower Venous Study Indications: Edema.  Performing Technologist: Landry Mellow RDMS, RVT  Examination Guidelines: A complete evaluation includes B-mode imaging, spectral Doppler, color Doppler, and power Doppler as needed of all accessible portions of each vessel. Bilateral testing is considered an integral part of a complete examination. Limited examinations for reoccurring indications may be performed as noted.  Right Venous Findings: +---------+---------------+---------+-----------+----------+-------+          CompressibilityPhasicitySpontaneityPropertiesSummary +---------+---------------+---------+-----------+----------+-------+ CFV      Full           Yes      Yes                          +---------+---------------+---------+-----------+----------+-------+ SFJ      Full                                                 +---------+---------------+---------+-----------+----------+-------+  FV Prox  Full                                                 +---------+---------------+---------+-----------+----------+-------+ FV Mid   Full                                                  +---------+---------------+---------+-----------+----------+-------+ FV DistalFull                                                 +---------+---------------+---------+-----------+----------+-------+ PFV      Full                                                 +---------+---------------+---------+-----------+----------+-------+ POP      Full           Yes      Yes                          +---------+---------------+---------+-----------+----------+-------+ PTV      Full                                                 +---------+---------------+---------+-----------+----------+-------+ PERO     Full                                                 +---------+---------------+---------+-----------+----------+-------+  Left Venous Findings: +---+---------------+---------+-----------+----------+-------+    CompressibilityPhasicitySpontaneityPropertiesSummary +---+---------------+---------+-----------+----------+-------+ CFVFull           Yes      Yes                          +---+---------------+---------+-----------+----------+-------+    Summary: Right: There is no evidence of deep vein thrombosis in the lower extremity. No cystic structure found in the popliteal fossa. Left: No evidence of common femoral vein obstruction.  *See table(s) above for measurements and observations. Electronically signed by Monica Martinez MD on 09/04/2018 at 3:40:05 PM.    Final    Ir Paracentesis  Result Date: 09/02/2018 INDICATION: Patient with acute cholecystitis status post percutaneous cholecystostomy tube. Abdominal distention and discomfort. Underlying history of cirrhosis. Ascites. Request for diagnostic and therapeutic paracentesis. EXAM: ULTRASOUND GUIDED LEFT LOWER QUADRANT PARACENTESIS MEDICATIONS: None. COMPLICATIONS: None immediate. PROCEDURE: Informed written consent was obtained from the patient after a discussion of the risks, benefits and alternatives to treatment. A  timeout was performed prior to the initiation of the procedure. Initial ultrasound scanning demonstrates a small to moderate amount of ascites within the left lower abdominal quadrant. The left lower abdomen was prepped and draped in the usual sterile fashion. 1% lidocaine with epinephrine was used  for local anesthesia. Following this, a 19 gauge, 7-cm, Yueh catheter was introduced. An ultrasound image was saved for documentation purposes. The paracentesis was performed. The catheter was removed and a dressing was applied. The patient tolerated the procedure well without immediate post procedural complication. FINDINGS: A total of approximately 470 mL of cloudy, amber/red fluid was removed. Samples were sent to the laboratory as requested by the clinical team. IMPRESSION: Successful ultrasound-guided paracentesis yielding 470 mL of peritoneal fluid. Read by: Ascencion Dike PA-C Electronically Signed   By: Aletta Edouard M.D.   On: 09/02/2018 11:24    Labs:  CBC: Recent Labs    09/02/18 0946 09/03/18 0608 09/04/18 0334 09/05/18 0414  WBC 9.4 8.7 8.6 7.1  HGB 12.1* 12.5* 11.8* 12.4*  HCT 37.5* 37.3* 34.9* 36.7*  PLT 61* 62* 60* 64*    COAGS: Recent Labs    07/22/18 0447 08/30/18 0705 08/31/18 0429 09/01/18 0602  INR 2.17 2.08 2.41 2.14    BMP: Recent Labs    09/02/18 0946 09/03/18 0608 09/04/18 0334 09/05/18 0414  NA 128* 130* 130* 131*  K 3.9 3.3* 3.4* 3.0*  CL 98 95* 97* 100  CO2 23 26 27 26   GLUCOSE 95 94 103* 99  BUN 10 12 14 13   CALCIUM 7.8* 7.5* 7.4* 7.4*  CREATININE 0.91 1.15 1.30* 1.32*  GFRNONAA >60 >60 56* 55*  GFRAA >60 >60 >60 >60    LIVER FUNCTION TESTS: Recent Labs    08/31/18 0429 08/31/18 1141 09/01/18 0602 09/02/18 0946  BILITOT 5.1* 4.7* 4.5* 3.0*  AST 32 33 32 36  ALT 15 16 16 15   ALKPHOS 79 71 80 82  PROT 7.2 7.3 8.3* 7.7  ALBUMIN 1.6* 1.5* 1.7* 1.7*    Assessment and Plan:  Percutaneous chole drain intact Remains x 6 weeks Should  flush 5 cc saline daily [facility] IR OP Clinic will see pt in follow up Orders in place He will hear from scheduler for time and date  Electronically Signed: Micahel Omlor A, PA-C 09/05/2018, 12:59 PM   I spent a total of 15 Minutes at the the patient's bedside AND on the patient's hospital floor or unit, greater than 50% of which was counseling/coordinating care for perc chole drain

## 2018-09-06 DIAGNOSIS — R9431 Abnormal electrocardiogram [ECG] [EKG]: Secondary | ICD-10-CM

## 2018-09-06 DIAGNOSIS — T8579XS Infection and inflammatory reaction due to other internal prosthetic devices, implants and grafts, sequela: Secondary | ICD-10-CM

## 2018-09-06 DIAGNOSIS — K7030 Alcoholic cirrhosis of liver without ascites: Secondary | ICD-10-CM

## 2018-09-06 DIAGNOSIS — N179 Acute kidney failure, unspecified: Secondary | ICD-10-CM

## 2018-09-06 LAB — CBC
HEMATOCRIT: 39 % (ref 39.0–52.0)
Hemoglobin: 12.9 g/dL — ABNORMAL LOW (ref 13.0–17.0)
MCH: 31.2 pg (ref 26.0–34.0)
MCHC: 33.1 g/dL (ref 30.0–36.0)
MCV: 94.4 fL (ref 80.0–100.0)
PLATELETS: 68 10*3/uL — AB (ref 150–400)
RBC: 4.13 MIL/uL — ABNORMAL LOW (ref 4.22–5.81)
RDW: 15 % (ref 11.5–15.5)
WBC: 6.9 10*3/uL (ref 4.0–10.5)
nRBC: 0 % (ref 0.0–0.2)

## 2018-09-06 LAB — BASIC METABOLIC PANEL
Anion gap: 8 (ref 5–15)
BUN: 14 mg/dL (ref 8–23)
CALCIUM: 7.6 mg/dL — AB (ref 8.9–10.3)
CO2: 27 mmol/L (ref 22–32)
Chloride: 96 mmol/L — ABNORMAL LOW (ref 98–111)
Creatinine, Ser: 1.26 mg/dL — ABNORMAL HIGH (ref 0.61–1.24)
GFR calc Af Amer: >60 mL/min (ref >60)
GFR, EST NON AFRICAN AMERICAN: 58 mL/min — AB (ref >60)
GLUCOSE: 102 mg/dL — AB (ref 70–99)
Potassium: 3.2 mmol/L — ABNORMAL LOW (ref 3.5–5.1)
Sodium: 131 mmol/L — ABNORMAL LOW (ref 135–145)

## 2018-09-06 MED ORDER — ALBUTEROL SULFATE (2.5 MG/3ML) 0.083% IN NEBU: 3.0000 mL | INHALATION_SOLUTION | Freq: Four times a day (QID) | RESPIRATORY_TRACT | Status: DC | PRN

## 2018-09-06 MED ORDER — ENSURE ENLIVE PO LIQD
237.0000 mL | Freq: Three times a day (TID) | ORAL | Status: DC
  Administered 2018-09-06 – 2018-09-09 (×8): 237 mL via ORAL

## 2018-09-06 MED ORDER — ALBUTEROL SULFATE (2.5 MG/3ML) 0.083% IN NEBU: 3.0000 mL | INHALATION_SOLUTION | RESPIRATORY_TRACT | Status: DC

## 2018-09-06 MED ORDER — ALBUTEROL SULFATE (2.5 MG/3ML) 0.083% IN NEBU: 3.0000 mL | INHALATION_SOLUTION | Freq: Four times a day (QID) | RESPIRATORY_TRACT | Status: DC

## 2018-09-06 MED ORDER — ONDANSETRON HCL 4 MG/2ML IJ SOLN
4.0000 mg | Freq: Once | INTRAMUSCULAR | Status: DC
  Filled 2018-09-06: qty 2

## 2018-09-06 MED ORDER — RAMELTEON 8 MG PO TABS
8.0000 mg | ORAL_TABLET | Freq: Every evening | ORAL | Status: DC | PRN
  Administered 2018-09-06 – 2018-09-09 (×3): 8 mg via ORAL
  Filled 2018-09-06 (×6): qty 1

## 2018-09-06 MED ORDER — POTASSIUM CHLORIDE CRYS ER 20 MEQ PO TBCR
40.0000 meq | EXTENDED_RELEASE_TABLET | Freq: Once | ORAL | Status: AC
  Administered 2018-09-06: 40 meq via ORAL
  Filled 2018-09-06: qty 2

## 2018-09-06 NOTE — Progress Notes (Signed)
Initial Nutrition Assessment  DOCUMENTATION CODES:  Obesity unspecified  INTERVENTION:  Ensure Enlive po TID, each supplement provides 350 kcal and 20 grams of protein  Took food/meal requests to try to promote intake  Provided brief diet education regarding cirrhosis and appropriate vs not appropriate food items  NUTRITION DIAGNOSIS:  Inadequate oral intake related to poor appetite, early satiety as evidenced by per patient report.  GOAL:  Patient will meet greater than or equal to 90% of their needs  MONITOR:  PO intake, Supplement acceptance, Labs, Weight trends, I & O's  REASON FOR ASSESSMENT:  Malnutrition Screening Tool    ASSESSMENT:  67 y/o male PMHx Cirrhosis 2/2 etoh abuse, Hep C, CAD s/p CABG, GERD, CHF. Had recent hospitalization 10/6-10/10 for cholecystitis, treated medically. Presents w/ worsening abdominal pain. Found to be septic 2/2 recurrent cholecystitis s/p perc chole drain 11/16.   Pt seen d/t +MST and reports of poor PO intake. He is on day 7 of hospitalization. Course prolonged d/t ongoing abdominal pain,  episodes of Afib and BLE swelling.  There is minimal meal records in chart, though the four that exist are >/= to 50%. However, patient says he is eating very little. He reports minimal appetite and early satiety. He takes a few bites and becomes full. He says his poor appetite predates his admission and it is something that has been a problem for a while now. PTA, he says he ate nothing but rice and cabbage for 1 month, because it was soft and it had no salt.   He does not know what his UBW is. He was weighed today on bed by RN and his weight was 202.4  lbs. He was 213 lbs at the beginning of this month. This is a loss of ~5% in under 1 month, though much of this loss is likely related to diuresis and paracentesis. He still exhibits edema   RD reviewed how his chronic illnesses, such as cirrhosis, will lead to decreased appetite and this is likely to be an  ongoing issue. He still needs to eat, even if he must force himself. RD asked him to eat small frequent meals and prioritize high kcal/protein items. He says he had a vegan dish brought to him by family. RD discouraged eating vegan right now as it is low in both kcal/pro.   RD attempted to elicit some items patient would be more likely to consume. Many of the items he asked for our low in kcal/pro-apple sauce, jello, mashed potatoes etc. RD recommended supplements which he accepted. Will increase to TID. Agree with placing patient on as liberal diet as possible.   Labs: na: 131, K:3.2, Glu: 102, Albumin: 1.7 Meds: Lactulose, Zofran, Miralax, Aldactone, PO abx, demadex  Recent Labs  Lab 09/01/18 0928  09/04/18 0334 09/05/18 0414 09/06/18 0602  NA 130*   < > 130* 131* 131*  K 4.4   < > 3.4* 3.0* 3.2*  CL 102   < > 97* 100 96*  CO2 22   < > 27 26 27   BUN 14   < > 14 13 14   CREATININE 1.08   < > 1.30* 1.32* 1.26*  CALCIUM 7.9*   < > 7.4* 7.4* 7.6*  MG 1.4*  --   --   --   --   GLUCOSE 102*   < > 103* 99 102*   < > = values in this interval not displayed.   NUTRITION - FOCUSED PHYSICAL EXAM:   Most Recent  Value  Orbital Region  Moderate depletion  Upper Arm Region  No depletion  Thoracic and Lumbar Region  No depletion  Buccal Region  No depletion  Temple Region  No depletion  Clavicle Bone Region  Mild depletion  Clavicle and Acromion Bone Region  Mild depletion  Scapular Bone Region  No depletion  Dorsal Hand  No depletion  Patellar Region  Unable to assess  Anterior Thigh Region  Unable to assess  Posterior Calf Region  Unable to assess  Edema (RD Assessment)  Moderate  Hair  Reviewed  Eyes  Reviewed  Mouth  Reviewed  Skin  Reviewed  Nails  Reviewed       Diet Order:   Diet Order            Diet regular Room service appropriate? Yes; Fluid consistency: Thin; Fluid restriction: 1500 mL Fluid  Diet effective now             EDUCATION NEEDS:  Education needs have  been addressed  Skin:  R side- Perc Chole Drain  Last BM:  11/23  Height:  Ht Readings from Last 1 Encounters:  08/30/18 5\' 6"  (1.676 m)   Weight:  Wt Readings from Last 1 Encounters:  09/06/18 91.8 kg   Wt Readings from Last 10 Encounters:  09/06/18 91.8 kg  08/15/18 96.6 kg  07/24/18 101.8 kg  03/22/18 97.9 kg  03/11/18 101.6 kg  02/28/18 100.7 kg  02/04/18 99.2 kg  09/08/17 116 kg  07/18/17 98 kg  04/04/17 99.8 kg   Ideal Body Weight:  64.55 kg  BMI:  Body mass index is 32.67 kg/m.  Estimated Nutritional Needs:  Kcal:  1950-2250 kcals (30-35 kcal/kg bw) Protein:  90-105g Pro (1.4-1.6g/kg bw) Fluid:  <1.5 L fluid (per fluid restriction order)  Burtis Junes RD, LDN, CNSC Clinical Nutrition Available Tues-Sat via Pager: [phone] 09/06/2018 4:36 PM

## 2018-09-06 NOTE — Progress Notes (Signed)
No atrial fibrillation. Will stop amiodarone. Sanda Klein, MD, [facility] [phone] office [phone] pager

## 2018-09-06 NOTE — Progress Notes (Addendum)
Family Medicine Teaching Service Daily Progress Note Intern Pager: [phone]  Patient name: Hunter Carter Medical record number: [id number] Date of birth: [birth year] Age: 67 y.o. Gender: male  Primary Care Provider: Nuala Alpha, DO Consultants:Surgery, IR Code Status:DNR  Pt Overview and Major Events to Date: 11/16 admitted with sepsis 2/2 cholecystitis, perc cholecystostomy tube placed by IR 11/17continued abd pain 11/18 atrial tachycardia to rate of 180, cardiology consulted, placed on IV amiodarone and metoprolol 11/20 cefepime, Flagyl, vancomycin transitioned to oral Bactrim 11/21 atrial tachycardia less frequent, developed an AKI likely due to torsemide 11/23 atrial tachycardia resolved, amiodarone stopped, still some volume overload, waiting for SNF placement  Assessment and Plan: Hunter Carter a 67 y.o.male who presented with sepsis secondary to cholecystitis s/p percutaneous cholecystostomy tube, sepsis now resolved. Fluid around cholecystostomy and ascitic fluid grew Enterobacter.  PMH is significant forHTN, CAD s/p CABG in 2004 with pacemaker in place, paroxysmal atrial tachycardia, liver cirrhosis 2/2 chronic alcohol abuse and treated Hep C with previous hepatic encephalopathy, chronic diastolic CHF, and paroxysmal atrial fibrillation.   Sepsis 2/2 acute cholecystitis, improving:s/p perc tube 11/16, WBC continuing to trend down.Bilious/peritoneal fluidculturegrowing moderate Enterobacter. Vitals have been stable with BP low-normal.Patient's outpatient GI clinic notified of hospitalization and they agreed to current plan. -IRconsulted and recommended continuing drain in place for another 6 weeks with [facility] RN to flush tube daily.  Outpatient follow-up appointment with them will be scheduled. -Continue Bactrim through November 29 for 14-day antibiotic course -Zofran PRN nausea -Daily CBC, CMP  -PT recommending SNF, OT recommending home health with  intermittent supervision, 3 and 1 bedside commode  Cirrhosis, secondary to EtOH and HepC, leading to volume overload: on torsemide due to superior absorption in patients with gut wall edema, spironolactone 100 mg, lactulose 30 g daily.  The - Continue torsemide, spironolactone, lactulose daily - Palliative consulted given elevated MELD, discussed with family recommended referral to outpatient palliative on discharge  Paroxysmal atrial tachycardiaand atrial fibwith pacemaker,improved: Follows with Dr. Loletha Grayer outpatient.Had A.fib with RVR for ~4 hours 11/20 and overnight. Echo 11/19 shows LV with 65-70% EF and normal wall motion.   Responding well to amiodarone and metoprolol with normal rate and rhythm overnight.   -Cardiac monitoring -We will stop amiodarone due to cardiology's recommendations -Metoprolol 25 mg BID -We will likely need potassium supplementation outpatient since hypokalemic with spironolactone and supplementation while inpatient  Lower Extremity Swelling: most likely related to cirrhosis.  -Torsemide 20 mg, spironolactone 100 mg  Chronic HFpEFstable: Also with 2+ bilateral LE edema, although crackles not heard on lung auscultation 11/23. Is net +2 L during hospitalization, although I&O recording has likely not been accurate.  Net -600 mL in last 24 hours.  Follows with Dr. Loletha Grayer. Has been difficult to obtain a weight for this patient while hospitalized. -Metoprolol 25mg  daily -Strict I&Os -Fluid restriction to 1500 mL daily -Weigh today -Torsemide 20 mg -Holding home losartan & statin -Elevate legs, compression stockings.  COPD, stable: On as needed nebulized albuterol while inpatient.  Does not take any chronic medications for this diagnosis. -Will schedule albuterol nebs due to increased risk for atelectasis while hospitalized  AKI, stable:Cr slowly improving after increasing from baseline of 0.9 to 1.31 on 11/21.  Creatinine 11/23 is 1.26.  Likely in the setting of  poor perfusion secondary to acute sepsis.  -Avoid nephrotoxic medications as possible  -daily BMP   CAD s/p CABG in 2004, stable:EKG without ischemic changes, troponin <0.03.  -Hold home statin, not on secondary prevention with  aspirin daily given previous GI bleed and severe liver cirrhosis.  FEN/GI:Fluids stopped;Regular diet,  colace/miralax ZOX:WRUEAVW subq given platelet level >50,000   Disposition:SNF   Subjective:  Patient says that his breathing and pain have both improved.  He says that he would like to go to a SNF at discharge since he thinks that it will be too difficult for him to take care of himself at home.  Objective: Temp:  [97.9 F (36.6 C)-98.4 F (36.9 C)] 98.2 F (36.8 C) (11/22 2245) Pulse Rate:  [67-79] 67 (11/22 2245) Resp:  [17-20] 20 (11/22 2245) BP: (86-107)/(65-72) 107/72 (11/22 2245) SpO2:  [92 %-94 %] 92 % (11/22 2245)  Physical Exam  Constitutional: He is oriented to person, place, and time. He appears well-developed and well-nourished. No distress.  HENT:  Head: Normocephalic and atraumatic.  Cardiovascular: Normal rate, regular rhythm and normal heart sounds.  Pulmonary/Chest: Effort normal and breath sounds normal. No respiratory distress. He has no rales.  Abdominal: Bowel sounds are normal. He exhibits distension. There is generalized tenderness (mild to palpation). There is no rebound.  Minimal bilious fluid in [facility] drain  Musculoskeletal: He exhibits edema (2+ pitting in bilateral lower extremities).  Compression stockings in place, legs are not elevated  Neurological: He is alert and oriented to person, place, and time.  Skin: Skin is warm and dry. Capillary refill takes less than 2 seconds.  Psychiatric: He has a normal mood and affect.   Laboratory: Recent Labs  Lab 09/04/18 0334 09/05/18 0414 09/06/18 0602  WBC 8.6 7.1 6.9  HGB 11.8* 12.4* 12.9*  HCT 34.9* 36.7* 39.0  PLT 60* 64* 68*   Recent Labs  Lab 08/31/18 1141  09/01/18 0602  09/02/18 0946  09/04/18 0334 09/05/18 0414 09/06/18 0602  NA 129* 131*   < > 128*   < > 130* 131* 131*  K 4.5 4.6   < > 3.9   < > 3.4* 3.0* 3.2*  CL 102 102   < > 98   < > 97* 100 96*  CO2 22 22   < > 23   < > 27 26 27   BUN 17 14   < > 10   < > 14 13 14   CREATININE 1.26* 1.03   < > 0.91   < > 1.30* 1.32* 1.26*  CALCIUM 7.8* 7.9*   < > 7.8*   < > 7.4* 7.4* 7.6*  PROT 7.3 8.3*  --  7.7  --   --   --   --   BILITOT 4.7* 4.5*  --  3.0*  --   --   --   --   ALKPHOS 71 80  --  82  --   --   --   --   ALT 16 16  --  15  --   --   --   --   AST 33 32  --  36  --   --   --   --   GLUCOSE 146* 93   < > 95   < > 103* 99 102*   < > = values in this interval not displayed.   Imaging/Diagnostic Tests: Dg Abd 1 View  Result Date: 08/31/2018 CLINICAL DATA:  Sepsis EXAM: ABDOMEN - 1 VIEW COMPARISON:  08/30/2018 FINDINGS: Scattered large and small bowel gas is noted. Contrast material is noted within the bladder from previous CT examination. Multiple gallstones and cholecystostomy tube are noted in the right upper quadrant. No free  air is seen. Degenerative changes of lumbar spine are noted. IMPRESSION: Cholelithiasis and cholecystostomy tube. No acute abnormality noted. Electronically Signed   By: Inez Catalina M.D.   On: 08/31/2018 09:36   Ct Angio Chest Pe W Or Wo Contrast  Result Date: 08/30/2018 CLINICAL DATA:  Central abdominal pain and pressure since last night. History of myocardial infarction and cirrhosis. EXAM: CT ANGIOGRAPHY CHEST CT ABDOMEN AND PELVIS WITH CONTRAST TECHNIQUE: Multidetector CT imaging of the chest was performed using the standard protocol during bolus administration of intravenous contrast. Multiplanar CT image reconstructions and MIPs were obtained to evaluate the vascular anatomy. Multidetector CT imaging of the abdomen and pelvis was performed using the standard protocol during bolus administration of intravenous contrast. CONTRAST:  167mL ISOVUE-370  IOPAMIDOL (ISOVUE-370) INJECTION 76% COMPARISON:  Abdominopelvic CT 07/20/2018. FINDINGS: CTA CHEST FINDINGS Cardiovascular: The pulmonary arteries are well opacified with contrast to the level of the subsegmental branches. There is no evidence of acute pulmonary embolism. There is diffuse atherosclerosis of the aorta, great vessels and coronary arteries status post median sternotomy and CABG. Left subclavian pacemaker leads extend into the right atrium and right ventricle. The heart is mildly enlarged. There is no pericardial effusion. Mediastinum/Nodes: There are no enlarged mediastinal, hilar or axillary lymph nodes. There is a small hiatal hernia. Distal esophageal varices are again noted. The trachea and thyroid gland appear unremarkable. Lungs/Pleura: Trace bilateral pleural effusions. There is no pneumothorax. Mild dependent atelectasis at both lung bases. No confluent airspace opacity or suspicious pulmonary nodule. Musculoskeletal/Chest wall: There is no chest wall mass or suspicious osseous finding. Review of the MIP images confirms the above findings. CT ABDOMEN and PELVIS FINDINGS Hepatobiliary: There are diffuse morphologic changes of cirrhosis. No focal hepatic lesion or abnormal enhancement identified. There is a large recanalized left paraumbilical vein. The portal vein is patent. There are multiple calcified gallstones. There is new diffuse gallbladder wall thickening with pericholecystic fluid inferiorly, highly suspicious for acute cholecystitis. No significant biliary dilatation demonstrated. Pancreas: Unremarkable. No pancreatic ductal dilatation or surrounding inflammatory changes. Spleen: Normal in size without focal abnormality. Adrenals/Urinary Tract: Both adrenal glands appear normal. There are tiny low-density renal lesions which are likely cysts. No evidence of urinary tract calculus or hydronephrosis. The bladder appears normal. Stomach/Bowel: The stomach and small bowel demonstrate no  significant findings. There is mild wall thickening throughout the proximal colon, likely related to the patient's underlying liver disease. No focal bowel wall thickening, significant distention or surrounding inflammation identified. Mild sigmoid diverticulosis. Vascular/Lymphatic: Mildly prominent lymph nodes in the porta hepatis are stable. There is moderate diffuse aortic and branch vessel atherosclerosis. As above, there is a recanalized paraumbilical vein with multiple large venous collaterals in the anterior abdominal wall and distal esophageal varices. No acute vascular findings are seen. Reproductive: There stable asymmetric calcifications within the left prostate lobe. The seminal vesicles appear normal. Other: There is a small amount of ascites around the liver and in the pelvis. There is mild diffuse mesenteric edema. No focal extraluminal fluid collections are seen. Musculoskeletal: No acute or significant osseous findings. Lower lumbar spondylosis, similar to previous examination. IMPRESSION: 1. New extensive gallbladder wall thickening and pericholecystic fluid highly suspicious for acute cholecystitis. There are multiple underlying calcified gallstones. No significant biliary dilatation. 2. Cirrhosis and portal hypertension with multiple varices, similar to previous study. 3. Ascites and diffuse mesenteric edema. 4. No acute vascular findings are identified. There is no evidence of acute pulmonary embolism. Aortic Atherosclerosis (ICD10-I70.0). 5. Preliminary results  have been discussed with Dr. Ellender Hose. Electronically Signed   By: Richardean Sale M.D.   On: 08/30/2018 10:53   Ct Abdomen Pelvis W Contrast  Result Date: 09/01/2018 CLINICAL DATA:  67 year old male with abdominal stenting following drain placement. Cannot tolerate p.o. Subsequent encounter. EXAM: CT ABDOMEN AND PELVIS WITH CONTRAST TECHNIQUE: Multidetector CT imaging of the abdomen and pelvis was performed using the standard  protocol following bolus administration of intravenous contrast. CONTRAST:  118mL OMNIPAQUE IOHEXOL 300 MG/ML  SOLN COMPARISON:  08/30/2018 CT. FINDINGS: Lower chest: Slight increase in size of small bilateral pleural effusions and basilar atelectasis. Pacemaker in place. Heart slightly enlarged. Coronary artery calcifications. Hepatobiliary: Cirrhotic liver. No dominant focal liver lesion. Recanalization umbilical vein. Portal vein remains patent. Percutaneous cholecystostomy has been performed in the interim. Decrease in size of inflamed gallbladder (which contains multiple stones). Decrease in size, although incomplete resolution, of complex gallbladder wall thickening. There remains mild inflammation of the superior margin of the adjacent right colon although mass effect has decreased in the interim. Inflammatory process approaches but does not compress the adjacent duodenum. Pancreas: No worrisome pancreatic mass. Spleen: Spleen of normal size without focal lesion. Adrenals/Urinary Tract: No obstructing stone or hydronephrosis. Tiny renal low-density structures too small to characterize statistically likely cysts. No adrenal mass. Noncontrast filled imaging urinary bladder without abnormality. Stomach/Bowel: Distal esophagus narrowing by prominent varix. Scattered colonic diverticula. Evaluation for detection of primary stomach/bowel inflammatory process limited by ascites and under distension. Vascular/Lymphatic: Atherosclerotic changes aorta and aortic branch vessels. No abdominal aortic aneurysm. No large vessel occlusion. Ectatic iliac arteries with right common iliac artery measuring 1.9 cm and left common iliac artery 1.8 cm. Upper abdominal adenopathy greatest porta hepatis and peripancreatic region may be reactive in origin. Reproductive: Slightly lobulated calcified prostate gland impresses upon the bladder base. Other: Ascites minimally changed from prior exam. No free intraperitoneal air. Third  spacing of fluid. Musculoskeletal: Degenerative changes lower lumbar spine most notable L5-S1. Bilateral hip joint degenerative changes. IMPRESSION: 1. Percutaneous cholecystostomy has been performed in the interim. Decrease in size of inflamed gallbladder (which contains multiple stones). Decrease in size, although incomplete resolution, of complex gallbladder wall thickening. There remains mild inflammation of the superior margin of the adjacent right colon although mass effect has decreased in the interim. Inflammatory process approaches but does not compress the adjacent duodenum. 2. Slight increase in small pleural effusions and basilar atelectasis. 3. Ascites without significant change.  Third spacing of fluid. 4. Distal esophagus narrowing by prominent varix. 5. Scattered colonic diverticula. Evaluation for detection of primary stomach/bowel inflammatory process limited by ascites and under distension. 6. Cirrhosis with portal hypertension.  Surrounding varices. 7. Upper abdominal adenopathy may be reactive in origin. 8.  Aortic Atherosclerosis (ICD10-I70.0). 9. Slightly lobulated calcified prostate gland with mild impression upon the bladder base. Electronically Signed   By: Genia Del M.D.   On: 09/01/2018 16:33   Ct Abdomen Pelvis W Contrast  Result Date: 08/30/2018 CLINICAL DATA:  Central abdominal pain and pressure since last night. History of myocardial infarction and cirrhosis. EXAM: CT ANGIOGRAPHY CHEST CT ABDOMEN AND PELVIS WITH CONTRAST TECHNIQUE: Multidetector CT imaging of the chest was performed using the standard protocol during bolus administration of intravenous contrast. Multiplanar CT image reconstructions and MIPs were obtained to evaluate the vascular anatomy. Multidetector CT imaging of the abdomen and pelvis was performed using the standard protocol during bolus administration of intravenous contrast. CONTRAST:  163mL ISOVUE-370 IOPAMIDOL (ISOVUE-370) INJECTION 76% COMPARISON:   Abdominopelvic  CT 07/20/2018. FINDINGS: CTA CHEST FINDINGS Cardiovascular: The pulmonary arteries are well opacified with contrast to the level of the subsegmental branches. There is no evidence of acute pulmonary embolism. There is diffuse atherosclerosis of the aorta, great vessels and coronary arteries status post median sternotomy and CABG. Left subclavian pacemaker leads extend into the right atrium and right ventricle. The heart is mildly enlarged. There is no pericardial effusion. Mediastinum/Nodes: There are no enlarged mediastinal, hilar or axillary lymph nodes. There is a small hiatal hernia. Distal esophageal varices are again noted. The trachea and thyroid gland appear unremarkable. Lungs/Pleura: Trace bilateral pleural effusions. There is no pneumothorax. Mild dependent atelectasis at both lung bases. No confluent airspace opacity or suspicious pulmonary nodule. Musculoskeletal/Chest wall: There is no chest wall mass or suspicious osseous finding. Review of the MIP images confirms the above findings. CT ABDOMEN and PELVIS FINDINGS Hepatobiliary: There are diffuse morphologic changes of cirrhosis. No focal hepatic lesion or abnormal enhancement identified. There is a large recanalized left paraumbilical vein. The portal vein is patent. There are multiple calcified gallstones. There is new diffuse gallbladder wall thickening with pericholecystic fluid inferiorly, highly suspicious for acute cholecystitis. No significant biliary dilatation demonstrated. Pancreas: Unremarkable. No pancreatic ductal dilatation or surrounding inflammatory changes. Spleen: Normal in size without focal abnormality. Adrenals/Urinary Tract: Both adrenal glands appear normal. There are tiny low-density renal lesions which are likely cysts. No evidence of urinary tract calculus or hydronephrosis. The bladder appears normal. Stomach/Bowel: The stomach and small bowel demonstrate no significant findings. There is mild wall thickening  throughout the proximal colon, likely related to the patient's underlying liver disease. No focal bowel wall thickening, significant distention or surrounding inflammation identified. Mild sigmoid diverticulosis. Vascular/Lymphatic: Mildly prominent lymph nodes in the porta hepatis are stable. There is moderate diffuse aortic and branch vessel atherosclerosis. As above, there is a recanalized paraumbilical vein with multiple large venous collaterals in the anterior abdominal wall and distal esophageal varices. No acute vascular findings are seen. Reproductive: There stable asymmetric calcifications within the left prostate lobe. The seminal vesicles appear normal. Other: There is a small amount of ascites around the liver and in the pelvis. There is mild diffuse mesenteric edema. No focal extraluminal fluid collections are seen. Musculoskeletal: No acute or significant osseous findings. Lower lumbar spondylosis, similar to previous examination. IMPRESSION: 1. New extensive gallbladder wall thickening and pericholecystic fluid highly suspicious for acute cholecystitis. There are multiple underlying calcified gallstones. No significant biliary dilatation. 2. Cirrhosis and portal hypertension with multiple varices, similar to previous study. 3. Ascites and diffuse mesenteric edema. 4. No acute vascular findings are identified. There is no evidence of acute pulmonary embolism. Aortic Atherosclerosis (ICD10-I70.0). 5. Preliminary results have been discussed with Dr. Ellender Hose. Electronically Signed   By: Richardean Sale M.D.   On: 08/30/2018 10:53   Ir Perc Cholecystostomy  Result Date: 08/30/2018 INDICATION: 67 year old with acute cholecystitis, sepsis and end-stage liver disease. Patient is not a surgical candidate due to his liver disease. Plan for percutaneous cholecystostomy tube placement. Patient was given FFP prior to the procedure. EXAM: IMAGE GUIDED PERCUTANEOUS CHOLECYSTOSTOMY TUBE PLACEMENT  ULTRASOUND-GUIDED PARACENTESIS MEDICATIONS: Patient received IV antibiotics in the emergency department prior to the procedure. ANESTHESIA/SEDATION: Moderate (conscious) sedation was employed during this procedure. A total of Versed 1.5 mg and Fentanyl 75 mcg was administered intravenously. Moderate Sedation Time: 13 minutes. The patient's level of consciousness and vital signs were monitored continuously by radiology nursing throughout the procedure under my direct supervision. FLUOROSCOPY TIME:  Fluoroscopy Time: 42 seconds, 9 mGy COMPLICATIONS: None immediate. PROCEDURE: Informed written consent was obtained from the patient after a thorough discussion of the procedural risks, benefits and alternatives. All questions were addressed. A timeout was performed prior to the initiation of the procedure. Patient was placed supine on the interventional table. Gallbladder and liver was evaluated with ultrasound. Right abdomen was prepped and draped in sterile fashion. Maximal barrier sterile technique was utilized including caps, mask, sterile gowns, sterile gloves, sterile drape, hand hygiene and skin antiseptic. Skin was anesthetized with 1% lidocaine. Due to the perihepatic ascites, a Safe-T-Centesis catheter was directed in the perihepatic space with ultrasound guidance. 300 mL of cloudy amber colored fluid was removed. Attention was directed to the cholecystostomy tube placement. After the paracentesis was complete, the skin was anesthetized with 1% lidocaine near the gallbladder. 21 gauge needle was directed into the gallbladder fundus with ultrasound guidance. 0.018 wire was advanced into the gallbladder. The tract was dilated with an Accustick set. A J wire was advanced into the gallbladder and the tract was dilated to accommodate a 10.2 [place] multipurpose drain. 90 mL of clay colored purulent looking fluid was removed from the gallbladder. The gallbladder was decompressed based on ultrasound at the end of the  procedure. Catheter was sutured to skin and attached to a gravity bag. Paracentesis catheter was also removed at the end of the procedure. FINDINGS: Initial ultrasound images demonstrate small to moderate amount of perihepatic ascites and fluid around the gallbladder. Therefore, a paracentesis was performed and majority of the perihepatic fluid was removed. 300 mL of cloudy amber colored fluid removed from the perihepatic space. The fluid appearance was concerning for infection. Gallbladder wall was markedly thickened. Multiple echogenic stones. Catheter was successfully advanced into the gallbladder and the gallbladder was decompressed. 90 mL of clay colored purulent looking fluid was removed from the gallbladder. Gallbladder was decompressed at end of the procedure. IMPRESSION: Successful image guided percutaneous cholecystostomy tube placement. In addition, 300 mL perihepatic ascites was removed during the procedure. Appearance of the peritoneal fluid is concerning for infection and underlying gallbladder perforation. Gallbladder fluid and peritoneal fluid was sent for culture. Electronically Signed   By: Markus Daft M.D.   On: 08/30/2018 17:05   Dg Chest Port 1 View  Result Date: 09/02/2018 CLINICAL DATA:  Cough, shortness of breath, recent kidney surgery, history coronary artery disease, CHF, cirrhosis, ethanol abuse, former smoker EXAM: PORTABLE CHEST 1 VIEW COMPARISON:  Portable exam 0652 hours compared to 08/30/2018 FINDINGS: LEFT subclavian AICD leads project at over RIGHT atrium and RIGHT ventricle. Enlargement of cardiac silhouette post CABG. Mediastinal contours and pulmonary vascularity normal. Bibasilar atelectasis greater on LEFT with small LEFT pleural effusion. Upper lungs clear. No pneumothorax. IMPRESSION: Bibasilar atelectasis LEFT greater than RIGHT with small LEFT pleural effusion. Enlargement of cardiac silhouette post CABG and ICD. Electronically Signed   By: Lavonia Dana M.D.   On:  09/02/2018 09:06   Dg Chest Portable 1 View  Result Date: 08/30/2018 CLINICAL DATA:  Abdominal pain with nausea, vomiting and cough for 3 weeks. History congestive heart failure and pacemaker. EXAM: PORTABLE CHEST 1 VIEW COMPARISON:  03/20/2018 and 09/07/2017 radiographs. FINDINGS: 0732 hour. The left subclavian pacemaker leads appear unchanged within the right atrium and right ventricle. The heart size is stable status post median sternotomy and CABG. The lungs remain clear. There is no pleural effusion or pneumothorax. The bones appear unchanged. Telemetry leads overlie the chest. IMPRESSION: Stable chest post CABG.  No  active cardiopulmonary process.   Dg Abd Portable 1v  Result Date: 09/01/2018 CLINICAL DATA:  Three-week history of lower abdominal pain. The patient reports the abdomen is distended and firm on the lower left. No bowel habit changes or nausea or vomiting. History of gastric ulcer disease with bleeding, percutaneous cholecystostomy on August 30, 2018 and paracentesis on the same day. EXAM: PORTABLE ABDOMEN - 1 VIEW COMPARISON:  Abdominal radiographs of August 31, 2018. FINDINGS: The bowel gas pattern is within the limits of normal. Multiple gallstones are noted in the right upper quadrant with a cholecystostomy tube present and stable. No bony abnormalities are observed. IMPRESSION: Fairly normal appearing bowel gas pattern. Known gallstones and cholecystostomy tube. No significant change since the previous study.   Vas [place] Lower Extremity Venous (dvt)  Result Date: 09/04/2018  Lower Venous Study Indications: Edema.  Performing Technologist: Landry Mellow RDMS, RVT  Examination Guidelines: A complete evaluation includes B-mode imaging, spectral Doppler, color Doppler, and power Doppler as needed of all accessible portions of each vessel. Bilateral testing is considered an integral part of a complete examination. Limited examinations for reoccurring indications may be performed as  noted.   Summary: Right: There is no evidence of deep vein thrombosis in the lower extremity. No cystic structure found in the popliteal fossa. Left: No evidence of common femoral vein obstruction.  *See table(s) above for measurements and observations  Ir Paracentesis  Result Date: 09/02/2018 INDICATION: Patient with acute cholecystitis status post percutaneous cholecystostomy tube. Abdominal distention and discomfort. Underlying history of cirrhosis. Ascites. Request for diagnostic and therapeutic paracentesis. EXAM: ULTRASOUND GUIDED LEFT LOWER QUADRANT PARACENTESIS MEDICATIONS: None. COMPLICATIONS: None immediate. PROCEDURE: Informed written consent was obtained from the patient after a discussion of the risks, benefits and alternatives to treatment. A timeout was performed prior to the initiation of the procedure. Initial ultrasound scanning demonstrates a small to moderate amount of ascites within the left lower abdominal quadrant. The left lower abdomen was prepped and draped in the usual sterile fashion. 1% lidocaine with epinephrine was used for local anesthesia. Following this, a 19 gauge, 7-cm, Yueh catheter was introduced. An ultrasound image was saved for documentation purposes. The paracentesis was performed. The catheter was removed and a dressing was applied. The patient tolerated the procedure well without immediate post procedural complication. FINDINGS: A total of approximately 470 mL of cloudy, amber/red fluid was removed. Samples were sent to the laboratory as requested by the clinical team. IMPRESSION: Successful ultrasound-guided paracentesis yielding 470 mL of peritoneal fluid. Read by: Ascencion Dike PA-C Electronically Signed   By: Aletta Edouard M.D.   On: 09/02/2018 11:24   Ir Paracentesis  Result Date: 08/31/2018 INDICATION: 67 year old with acute cholecystitis, sepsis and end-stage liver disease. Patient is not a surgical candidate due to his liver disease. Plan for  percutaneous cholecystostomy tube placement. Patient was given FFP prior to the procedure. EXAM: IMAGE GUIDED PERCUTANEOUS CHOLECYSTOSTOMY TUBE PLACEMENT ULTRASOUND-GUIDED PARACENTESIS MEDICATIONS: Patient received IV antibiotics in the emergency department prior to the procedure. ANESTHESIA/SEDATION: Moderate (conscious) sedation was employed during this procedure. A total of Versed 1.5 mg and Fentanyl 75 mcg was administered intravenously. Moderate Sedation Time: 13 minutes. The patient's level of consciousness and vital signs were monitored continuously by radiology nursing throughout the procedure under my direct supervision. FLUOROSCOPY TIME:  Fluoroscopy Time: 42 seconds, 9 mGy COMPLICATIONS: None immediate. PROCEDURE: Informed written consent was obtained from the patient after a thorough discussion of the procedural risks, benefits and alternatives. All questions were addressed. A  timeout was performed prior to the initiation of the procedure. Patient was placed supine on the interventional table. Gallbladder and liver was evaluated with ultrasound. Right abdomen was prepped and draped in sterile fashion. Maximal barrier sterile technique was utilized including caps, mask, sterile gowns, sterile gloves, sterile drape, hand hygiene and skin antiseptic. Skin was anesthetized with 1% lidocaine. Due to the perihepatic ascites, a Safe-T-Centesis catheter was directed in the perihepatic space with ultrasound guidance. 300 mL of cloudy amber colored fluid was removed. Attention was directed to the cholecystostomy tube placement. After the paracentesis was complete, the skin was anesthetized with 1% lidocaine near the gallbladder. 21 gauge needle was directed into the gallbladder fundus with ultrasound guidance. 0.018 wire was advanced into the gallbladder. The tract was dilated with an Accustick set. A J wire was advanced into the gallbladder and the tract was dilated to accommodate a 10.2 [place] multipurpose  drain. 90 mL of clay colored purulent looking fluid was removed from the gallbladder. The gallbladder was decompressed based on ultrasound at the end of the procedure. Catheter was sutured to skin and attached to a gravity bag. Paracentesis catheter was also removed at the end of the procedure. FINDINGS: Initial ultrasound images demonstrate small to moderate amount of perihepatic ascites and fluid around the gallbladder. Therefore, a paracentesis was performed and majority of the perihepatic fluid was removed. 300 mL of cloudy amber colored fluid removed from the perihepatic space. The fluid appearance was concerning for infection. Gallbladder wall was markedly thickened. Multiple echogenic stones. Catheter was successfully advanced into the gallbladder and the gallbladder was decompressed. 90 mL of clay colored purulent looking fluid was removed from the gallbladder. Gallbladder was decompressed at end of the procedure. IMPRESSION: Successful image guided percutaneous cholecystostomy tube placement. In addition, 300 mL perihepatic ascites was removed during the procedure. Appearance of the peritoneal fluid is concerning for infection and underlying gallbladder perforation. Gallbladder fluid and peritoneal fluid was sent for culture. Electronically Signed   By: Markus Daft M.D.   On: 08/30/2018 17:05    Kathrene Alu, MD 09/06/2018, 8:38 AM PGY-2, [place] Intern pager: [phone], text pages welcome

## 2018-09-07 DIAGNOSIS — I471 Supraventricular tachycardia: Secondary | ICD-10-CM

## 2018-09-07 DIAGNOSIS — N189 Chronic kidney disease, unspecified: Secondary | ICD-10-CM

## 2018-09-07 DIAGNOSIS — K819 Cholecystitis, unspecified: Secondary | ICD-10-CM

## 2018-09-07 LAB — CBC
HCT: 37.5 % — ABNORMAL LOW (ref 39.0–52.0)
HEMOGLOBIN: 12.7 g/dL — AB (ref 13.0–17.0)
MCH: 31.9 pg (ref 26.0–34.0)
MCHC: 33.9 g/dL (ref 30.0–36.0)
MCV: 94.2 fL (ref 80.0–100.0)
NRBC: 0 % (ref 0.0–0.2)
Platelets: 73 10*3/uL — ABNORMAL LOW (ref 150–400)
RBC: 3.98 MIL/uL — ABNORMAL LOW (ref 4.22–5.81)
RDW: 15.7 % — AB (ref 11.5–15.5)
WBC: 7.5 10*3/uL (ref 4.0–10.5)

## 2018-09-07 LAB — BASIC METABOLIC PANEL WITH GFR
Anion gap: 4 — ABNORMAL LOW (ref 5–15)
BUN: 15 mg/dL (ref 8–23)
CO2: 27 mmol/L (ref 22–32)
Calcium: 7.8 mg/dL — ABNORMAL LOW (ref 8.9–10.3)
Chloride: 101 mmol/L (ref 98–111)
Creatinine, Ser: 1.37 mg/dL — ABNORMAL HIGH (ref 0.61–1.24)
GFR calc Af Amer: >60 mL/min
GFR calc non Af Amer: 53 mL/min — ABNORMAL LOW
Glucose, Bld: 91 mg/dL (ref 70–99)
Potassium: 4.1 mmol/L (ref 3.5–5.1)
Sodium: 132 mmol/L — ABNORMAL LOW (ref 135–145)

## 2018-09-07 LAB — CULTURE, BODY FLUID-BOTTLE: CULTURE: NO GROWTH

## 2018-09-07 LAB — CULTURE, BODY FLUID W GRAM STAIN -BOTTLE

## 2018-09-07 MED ORDER — LIDOCAINE 5 % EX PTCH
1.0000 | MEDICATED_PATCH | CUTANEOUS | Status: DC
  Administered 2018-09-07 – 2018-09-10 (×4): 1 via TRANSDERMAL
  Filled 2018-09-07 (×4): qty 1

## 2018-09-07 NOTE — Progress Notes (Signed)
Family Medicine Teaching Service Daily Progress Note Intern Pager: [phone]  Patient name: [facility] Medical record number: [id number] Date of birth: 1951/07/08 Age: 67 y.o. Gender: male  Primary Care Provider: Nuala Alpha, DO Consultants: IR, cards, surgery Code Status: DNR  Pt Overview and Major Events to Date:  11/16 admitted with sepsis 2/2 cholecystitis, perc cholecystostomy tube placed by IR 11/17continued abd pain 11/18 atrial tachycardia to rate of 180, cardiology consulted, placed on IV amiodarone and metoprolol 11/20 cefepime, Flagyl, vancomycin transitioned to oral Bactrim 11/21 atrial tachycardia less frequent, developed an AKI likely due to torsemide 11/23 atrial tachycardia resolved, amiodarone stopped, still some volume overload, waiting for SNF placement  Assessment and Plan: [facility] a 67 y.o.male who presented with sepsis secondary to cholecystitis s/p percutaneous cholecystostomy tube, sepsis now resolved. Fluid around cholecystostomy and ascitic fluid grew Enterobacter.  PMH is significant forHTN, CAD s/p CABG in 2004 with pacemaker in place, paroxysmal atrial tachycardia, liver cirrhosis 2/2 chronic alcohol abuse and treated Hep C with previous hepatic encephalopathy, chronic diastolic CHF, and paroxysmal atrial fibrillation.   Sepsis 2/2 acute cholecystitis, improving:s/p perc tube 11/16, WBC normalized.Enterobacter from perc tube culture, sensitive to bactrim. Vitals have been stable with BP low-normal. -outpatient IR f/u for perc tube  -Continue Bactrim through November 29 for 14-day antibiotic course -Zofran PRN nausea -Daily CBC, CMP  -PT recommending SNF, OT recommending home health with intermittent supervision, 3 and 1 bedside commode  Cirrhosis (Childs-Pugh 2/3), 2/2 previous EtOH use and HepC (treated): on torsemide and spiro, exam stable today, minimal abd pain. Torsemide dose reduced on 11/22 for Cr. No signs of HE on exam.   -continue torsemide 20mg  QD -continue spiro 100mg  -continue lactulose  Paroxysmal atrial tachycardiaand atrial fibwith pacemaker,improved: no more afib, Cards d/c amio 11/23.  -d/c Cardiac monitoring -Metoprolol 25 mg BID  Lower Extremity Swelling: most likely related to cirrhosis. Improving with compression stockings.  -Torsemide 20 mg, spironolactone 100 mg  Chronic HFpEFstable: no hypervolemia on exam today.  -Metoprolol 25mg  daily -Strict I&Os -Fluid restriction to 1500 mL daily -Weigh today -Torsemide 20 mg -Holding home losartan & statin -Elevate legs, compression stockings.  AKI, stable:Cr slowly improving after increasing from baseline of 0.9 to 1.31 on 11/21.  Cr 1.37 today, but limited by needing diuretics for ascites.  -Avoid nephrotoxic medications as possible  -daily BMP   CAD s/p CABG in 2004, stable:EKG without ischemic changes, troponin <0.03.  -Hold home statin, not on secondary prevention with aspirin daily given previous GI bleed and severe liver cirrhosis.  Back pain: pt thinks 2/2 bed, no abdominal pain to suggest c/w worsening ascites.  -lidocaine patch -kpad  FEN/GI:Fluids stopped;Regular diet,  colace/miralax IRW:ERXVQMG subq given platelet level >50,000  Disposition: pending SNF  Subjective:  Patient feels well today, wearing compression stockings. No abd pain, has some back pain that he feels is 2/2 lying in the hospital bed. Encouraged him to ambulate. Also with decreased appetite, states the hospital food doesn't taste good to him.  Objective: Temp:  [98.1 F (36.7 C)-98.6 F (37 C)] 98.1 F (36.7 C) (11/24 0531) Pulse Rate:  [60-67] 63 (11/24 0531) Resp:  [18-20] 20 (11/24 0531) BP: (89-96)/(58-63) 94/58 (11/24 0531) SpO2:  [93 %-96 %] 93 % (11/24 0531) Weight:  [91.8 kg-92.4 kg] 92.4 kg (11/24 0453) Physical Exam: General: sitting in chair in NAD Cardiovascular: RRR, no murmru Respiratory: CTAB, easy WOB Abdomen: soft,  distended, mildly TTP around drain site, +BS Extremities: 1+ edema to shins  Laboratory: Recent Labs  Lab 09/05/18 0414 09/06/18 0602 09/07/18 0410  WBC 7.1 6.9 7.5  HGB 12.4* 12.9* 12.7*  HCT 36.7* 39.0 37.5*  PLT 64* 68* 73*   Recent Labs  Lab 08/31/18 1141 09/01/18 0602  09/02/18 0946  09/05/18 0414 09/06/18 0602 09/07/18 0410  NA 129* 131*   < > 128*   < > 131* 131* 132*  K 4.5 4.6   < > 3.9   < > 3.0* 3.2* 4.1  CL 102 102   < > 98   < > 100 96* 101  CO2 22 22   < > 23   < > 26 27 27   BUN 17 14   < > 10   < > 13 14 15   CREATININE 1.26* 1.03   < > 0.91   < > 1.32* 1.26* 1.37*  CALCIUM 7.8* 7.9*   < > 7.8*   < > 7.4* 7.6* 7.8*  PROT 7.3 8.3*  --  7.7  --   --   --   --   BILITOT 4.7* 4.5*  --  3.0*  --   --   --   --   ALKPHOS 71 80  --  82  --   --   --   --   ALT 16 16  --  15  --   --   --   --   AST 33 32  --  36  --   --   --   --   GLUCOSE 146* 93   < > 95   < > 99 102* 91   < > = values in this interval not displayed.     Imaging/Diagnostic Tests: No new imaging.   Hunter Hilding, MD 09/07/2018, 9:48 AM PGY-3, [place] Intern pager: [phone], text pages welcome

## 2018-09-08 DIAGNOSIS — Z434 Encounter for attention to other artificial openings of digestive tract: Secondary | ICD-10-CM

## 2018-09-08 LAB — COMPREHENSIVE METABOLIC PANEL
ALT: 18 U/L (ref 0–44)
AST: 40 U/L (ref 15–41)
Albumin: 1.5 g/dL — ABNORMAL LOW (ref 3.5–5.0)
Alkaline Phosphatase: 79 U/L (ref 38–126)
Anion gap: 4 — ABNORMAL LOW (ref 5–15)
BUN: 16 mg/dL (ref 8–23)
CHLORIDE: 99 mmol/L (ref 98–111)
CO2: 30 mmol/L (ref 22–32)
Calcium: 8.2 mg/dL — ABNORMAL LOW (ref 8.9–10.3)
Creatinine, Ser: 1.51 mg/dL — ABNORMAL HIGH (ref 0.61–1.24)
GFR calc Af Amer: 54 mL/min — ABNORMAL LOW (ref >60)
GFR calc non Af Amer: 47 mL/min — ABNORMAL LOW (ref >60)
Glucose, Bld: 135 mg/dL — ABNORMAL HIGH (ref 70–99)
Potassium: 4.5 mmol/L (ref 3.5–5.1)
Sodium: 133 mmol/L — ABNORMAL LOW (ref 135–145)
TOTAL PROTEIN: 8.7 g/dL — AB (ref 6.5–8.1)
Total Bilirubin: 3 mg/dL — ABNORMAL HIGH (ref 0.3–1.2)

## 2018-09-08 LAB — CBC
HCT: 40.9 % (ref 39.0–52.0)
HEMOGLOBIN: 13.6 g/dL (ref 13.0–17.0)
MCH: 31.9 pg (ref 26.0–34.0)
MCHC: 33.3 g/dL (ref 30.0–36.0)
MCV: 96 fL (ref 80.0–100.0)
NRBC: 0.5 % — AB (ref 0.0–0.2)
PLATELETS: 81 10*3/uL — AB (ref 150–400)
RBC: 4.26 MIL/uL (ref 4.22–5.81)
RDW: 15.9 % — ABNORMAL HIGH (ref 11.5–15.5)
WBC: 5.8 10*3/uL (ref 4.0–10.5)

## 2018-09-08 MED ORDER — SPIRONOLACTONE 25 MG PO TABS
50.0000 mg | ORAL_TABLET | Freq: Every day | ORAL | Status: DC
  Administered 2018-09-09 – 2018-09-10 (×2): 50 mg via ORAL
  Filled 2018-09-08 (×2): qty 2

## 2018-09-08 MED ORDER — ONDANSETRON HCL 4 MG PO TABS
4.0000 mg | ORAL_TABLET | Freq: Once | ORAL | Status: AC
  Administered 2018-09-08: 4 mg via ORAL
  Filled 2018-09-08: qty 1

## 2018-09-08 NOTE — NC FL2 (Signed)
[place] MEDICAID FL2 LEVEL OF CARE SCREENING TOOL     IDENTIFICATION  Patient Name: [facility] Carter Birthdate: 29-Aug-1951 Sex: male Admission Date (Current Location): 08/30/2018  [facility] and [place] Number:  Herbalist and Address:  [facility] [place]. [facility], [place] [address] [place], [place] [address]      Provider Number: [id number]  Attending Physician Name and Address:  Martyn Malay, MD  Relative Name and Phone Number:  [name], daughter, [phone]    Current Level of Care: Hospital Recommended Level of Care: [place] Prior Approval Number:    Date Approved/Denied:   PASRR Number: [id number]  Discharge Plan: SNF    Current Diagnoses: Patient Active Problem List   Diagnosis Date Noted  . Abdominal distension   . Sepsis due to Streptococcus species ([place])   . [place] II   . Hyperkalemia   . Severe sepsis ([place]) 08/30/2018  . Boil of buttock 08/20/2018  . Cholecystitis, acute 08/20/2018  . Abdominal pain 07/20/2018  . Acute hepatic encephalopathy 03/20/2018  . Gastroesophageal reflux disease 03/11/2018  . CAD of autologous vein bypass graft without angina 03/01/2018  . Healthcare maintenance 02/13/2018  . Skin lesion 02/13/2018  . Cirrhosis ([place]) 08/08/2016  . Paroxysmal atrial fibrillation ([place]) 01/05/2016  . Tobacco abuse 01/05/2016  . Hepatic encephalopathy ([place]) 10/10/2015  . Pain, joint, ankle, left 10/10/2015  . Thrombocytopathia ([place]) 10/10/2015  . HLD (hyperlipidemia) 10/10/2015  . Chronic diastolic congestive heart failure ([place]) 10/10/2015  . Altered mental status   . Increased ammonia level   . Leg swelling   . Encephalopathy acute 08/01/2015  . GI bleed 07/29/2015  . Acute GI bleeding 07/29/2015  . Acute respiratory failure ([place])   . Bleeding gastrointestinal   . Hemorrhagic shock ([place])   . CAD s/p CABG 2004 07/20/2013  . Pacemaker - dual chamber Medtronic 2011 07/20/2013  . Hepatitis C 07/20/2013   . Hyperlipidemia 07/20/2013  . PAT (paroxysmal atrial tachycardia) ([place]) 07/20/2013  . Obesity (BMI 30-39.9) 07/20/2013  . NSVT (nonsustained ventricular tachycardia) ([place]) 07/20/2013  . HTN (hypertension) 07/16/2013    Orientation RESPIRATION BLADDER Height & Weight     Self, Time, Situation, Place  Normal Continent Weight: 92.4 kg Height:  5\' 6"  (167.6 cm)  BEHAVIORAL SYMPTOMS/MOOD NEUROLOGICAL BOWEL NUTRITION STATUS      Continent(Biliary tube) Diet(Please see DC Summary)  AMBULATORY STATUS COMMUNICATION OF NEEDS Skin   Limited Assist Verbally Normal                       Personal Care Assistance Level of Assistance  Bathing, Feeding, Dressing Bathing Assistance: Limited assistance Feeding assistance: Independent Dressing Assistance: Limited assistance     Functional Limitations Info  Sight, Hearing, Speech Sight Info: Adequate Hearing Info: Adequate Speech Info: Adequate    SPECIAL CARE FACTORS FREQUENCY  PT (By licensed PT), OT (By licensed OT)     PT Frequency: 5x/week OT Frequency: 3x/week            Contractures Contractures Info: Not present    Additional Factors Info  Code Status, Allergies Code Status Info: DNR Allergies Info: NKA           Current Medications (09/08/2018):  This is the current hospital active medication list Current Facility-Administered Medications  Medication Dose Route Frequency Provider Last Rate Last Dose  . acetaminophen (TYLENOL) tablet 500 mg  500 mg Oral Q6H PRN Milus Banister C, DO   500 mg at 09/07/18  2256  . albuterol (PROVENTIL) (2.5 MG/3ML) 0.083% nebulizer solution 3 mL  3 mL Inhalation Q6H PRN Martyn Malay, MD      . benzonatate (TESSALON) capsule 200 mg  200 mg Oral TID PRN Kathrene Alu, MD   200 mg at 09/03/18 0125  . docusate sodium (COLACE) capsule 100 mg  100 mg Oral BID Kathrene Alu, MD   100 mg at 09/07/18 2254  . feeding supplement (ENSURE ENLIVE) (ENSURE ENLIVE) liquid 237 mL  237  mL Oral TID BM Martyn Malay, MD   237 mL at 09/07/18 2256  . heparin injection 5,000 Units  5,000 Units Subcutaneous Q8H Sela Hilding, MD   5,000 Units at 09/08/18 0555  . lactulose (CHRONULAC) 10 GM/15ML solution 30 g  30 g Oral Daily Croitoru, Mihai, MD   30 g at 09/07/18 1007  . lidocaine (LIDODERM) 5 % 1 patch  1 patch Transdermal Q24H Sela Hilding, MD   1 patch at 09/07/18 1005  . lip balm (CARMEX) ointment   Topical PRN Kathrene Alu, MD      . metoprolol tartrate (LOPRESSOR) tablet 25 mg  25 mg Oral BID Kathrene Alu, MD   25 mg at 09/07/18 1009  . ondansetron (ZOFRAN) injection 4 mg  4 mg Intravenous Once Beard, Samantha N, DO      . polyethylene glycol (MIRALAX / GLYCOLAX) packet 17 g  17 g Oral Daily Kathrene Alu, MD   17 g at 09/07/18 1011  . ramelteon (ROZEREM) tablet 8 mg  8 mg Oral QHS PRN Sherene Sires, DO   8 mg at 09/07/18 2255  . spironolactone (ALDACTONE) tablet 100 mg  100 mg Oral Daily Kathrene Alu, MD   100 mg at 09/07/18 1008  . sulfamethoxazole-trimethoprim (BACTRIM DS,SEPTRA DS) 800-160 MG per tablet 1 tablet  1 tablet Oral Q12H Kathrene Alu, MD   1 tablet at 09/07/18 2254     Discharge Medications: Please see discharge summary for a list of discharge medications.  Relevant Imaging Results:  Relevant Lab Results:   Additional Information SSN: [id number] [address] LCSW

## 2018-09-08 NOTE — Progress Notes (Signed)
Referring Physician(s): Dr Jonny Ruiz  Supervising Physician: Arne Cleveland  Patient Status:  [facility] - In-pt  Chief Complaint:  Perc chole drain placed 11/16 in IR  Subjective:  Patient sitting up in chair. No complaints.   Allergies: Patient has no known allergies.  Medications: Prior to Admission medications   Medication Sig Start Date End Date Taking? Authorizing Provider  furosemide (LASIX) 40 MG tablet TAKE 1 TABLET BY MOUTH  DAILY AS NEEDED Patient taking differently: Take 40 mg by mouth daily. Make take an additional 40 mg if needed 07/15/18  Yes Lockamy, Timothy, DO  lactulose (CHRONULAC) 10 GM/15ML solution Take 45 mLs (30 g total) by mouth 2 (two) times daily. Titrate for 3-4 BM's/ day Patient taking differently: Take 30 g by mouth 3 (three) times daily. Titrate for 3-4 BM's/ day 03/22/18  Yes Sheikh, Omair Latif, DO  losartan (COZAAR) 25 MG tablet TAKE 1 TABLET BY MOUTH  DAILY Patient taking differently: Take 25 mg by mouth daily.  04/30/18  Yes Lockamy, Timothy, DO  metoprolol tartrate (LOPRESSOR) 50 MG tablet TAKE 1 TABLET BY MOUTH TWO  TIMES DAILY Patient taking differently: Take 50 mg by mouth daily.  07/15/18  Yes Lockamy, Timothy, DO  omeprazole (PRILOSEC) 20 MG capsule TAKE 1 CAPSULE BY MOUTH  DAILY Patient taking differently: Take 20 mg by mouth daily.  07/15/18  Yes Lockamy, Timothy, DO  PROAIR HFA 108 (90 Base) MCG/ACT inhaler USE 2 PUFFS BY MOUTH EVERY  4 HOURS AS NEEDED FOR  WHEEZING OR SHORTNESS OF  BREATH (OR COUGHING) Patient taking differently: Inhale 1-2 puffs into the lungs every 4 (four) hours as needed for wheezing.  06/20/18  Yes Lockamy, Timothy, DO  simvastatin (ZOCOR) 20 MG tablet TAKE 1 TABLET BY MOUTH  DAILY Patient taking differently: Take 20 mg by mouth daily at 6 PM.  07/15/18  Yes Lockamy, Timothy, DO     Vital Signs: BP 99/65 (BP Location: Left Arm)   Pulse 63   Temp (!) 97.4 F (36.3 C) (Oral)   Resp 16   Ht 5\' 6"  (1.676 m)   Wt 92.4 kg    SpO2 94%   BMI 32.88 kg/m   Physical Exam Awake and alert Chole drain in place Bilious output ~ 1 liter, nurse concerned it was ascites. Actually looks like normal bilious output.   Imaging: No results found.  Labs:  CBC: Recent Labs    09/05/18 0414 09/06/18 0602 09/07/18 0410 09/08/18 0950  WBC 7.1 6.9 7.5 5.8  HGB 12.4* 12.9* 12.7* 13.6  HCT 36.7* 39.0 37.5* 40.9  PLT 64* 68* 73* 81*    COAGS: Recent Labs    07/22/18 0447 08/30/18 0705 08/31/18 0429 09/01/18 0602  INR 2.17 2.08 2.41 2.14    BMP: Recent Labs    09/05/18 0414 09/06/18 0602 09/07/18 0410 09/08/18 0950  NA 131* 131* 132* 133*  K 3.0* 3.2* 4.1 4.5  CL 100 96* 101 99  CO2 26 27 27 30   GLUCOSE 99 102* 91 135*  BUN 13 14 15 16   CALCIUM 7.4* 7.6* 7.8* 8.2*  CREATININE 1.32* 1.26* 1.37* 1.51*  GFRNONAA 55* 58* 53* 47*  GFRAA >60 >60 >60 54*    LIVER FUNCTION TESTS: Recent Labs    08/31/18 1141 09/01/18 0602 09/02/18 0946 09/08/18 0950  BILITOT 4.7* 4.5* 3.0* 3.0*  AST 33 32 36 40  ALT 16 16 15 18   ALKPHOS 71 80 82 79  PROT 7.3 8.3* 7.7 8.7*  ALBUMIN 1.5* 1.7* 1.7* 1.5*    Assessment and Plan:   Perc chole drain placed 11/16 in IR  Percutaneous chole drain intact with normal bilious output  Remains x 6 weeks  Should flush 5 mL saline daily  IR OP Clinic will see pt in follow up Orders in place  He will hear from scheduler for time and date  Electronically Signed: Murrell Redden, PA-C 09/08/2018, 4:52 PM    I spent a total of 15 Minutes at the the patient's bedside AND on the patient's hospital floor or unit, greater than 50% of which was counseling/coordinating care for chole tube.

## 2018-09-08 NOTE — Clinical Social Work Note (Signed)
Clinical Social Work Assessment  Patient Details  Name: Hunter Carter MRN: [id number] Date of Birth: [birth year]  Date of referral:  09/08/18               Reason for consult:  Facility Placement                Permission sought to share information with:  Facility Art therapist granted to share information::  Yes, Verbal Permission Granted  Name::        Agency::  SNFs  Relationship::     Contact Information:     Housing/Transportation Living arrangements for the past 2 months:  Apartment Source of Information:  Patient Patient Interpreter Needed:  None Criminal Activity/Legal Involvement Pertinent to Current Situation/Hospitalization:  No - Comment as needed Significant Relationships:  Adult Children Lives with:  Self Do you feel safe going back to the place where you live?  No Need for family participation in patient care:  No (Coment)  Care giving concerns:  CSW received consult for possible SNF placement at time of discharge. CSW spoke with patient regarding PT recommendation of SNF placement at time of discharge. Patient reported that he would like some rehab before going home alone. Patient expressed understanding of PT recommendation and is agreeable to SNF placement at time of discharge. CSW to continue to follow and assist with discharge planning needs.   Social Worker assessment / plan:  CSW spoke with patient concerning possibility of rehab at [facility] before returning home.  Employment status:  Retired Nurse, adult PT Recommendations:  [place] / Referral to community resources:  [place]  Patient/Family's Response to care:  Patient recognizes need for rehab before returning home and is agreeable to a SNF in [place]. Patient reported preference for [facility] since he has been there before. [facility] available and has started insurance authorization.   Patient/Family's  Understanding of and Emotional Response to Diagnosis, Current Treatment, and Prognosis:  Patient/family is realistic regarding therapy needs and expressed being hopeful for SNF placement. Patient expressed understanding of CSW role and discharge process as well as medical condition. No questions/concerns about plan or treatment.    Emotional Assessment Appearance:  Appears stated age Attitude/Demeanor/Rapport:  Gracious Affect (typically observed):  Accepting, Appropriate Orientation:  Oriented to Self, Oriented to Place, Oriented to  Time, Oriented to Situation Alcohol / Substance use:  Not Applicable Psych involvement (Current and /or in the community):  No (Comment)  Discharge Needs  Concerns to be addressed:  Care Coordination Readmission within the last 30 days:  No Current discharge risk:  None Barriers to Discharge:  Continued Medical Work up   [employer], LCSW 09/08/2018, 11:02 AM

## 2018-09-08 NOTE — Progress Notes (Signed)
Family Medicine Teaching Service Daily Progress Note Intern Pager: [phone]  Patient name: Hunter Carter Medical record number: [id number] Date of birth: 1951/03/17 Age: 67 y.o. Gender: male  Primary Care Provider: Nuala Alpha, DO Consultants: IR, cards, surgery Code Status: DNR  Pt Overview and Major Events to Date:  11/16 admitted with sepsis 2/2 cholecystitis, perc cholecystostomy tube placed by IR 11/17continued abd pain 11/18atrial tachycardia to rate of 180, cardiology consulted, placed on IV amiodarone and metoprolol 11/20 cefepime, Flagyl, vancomycin transitioned to oral Bactrim 11/21atrial tachycardialess frequent,developedan AKI likely due to torsemide 11/23atrial tachycardia resolved,amiodarone stopped,still some volume overload, waiting for SNF placement  Assessment and Plan: Hunter Carter a 67 y.o.male who presented with sepsis secondary to cholecystitis s/p percutaneous cholecystostomy tube, sepsis now resolved. Fluid around cholecystostomy and ascitic fluid grew Enterobacter. PMH is significant forHTN, CAD s/p CABG in 2004 with pacemaker in place, paroxysmal atrial tachycardia, liver cirrhosis 2/2 chronic alcohol abuse and treated Hep C with previous hepatic encephalopathy, chronic diastolic CHF, and paroxysmal atrial fibrillation.   Sepsis 2/2 acute cholecystitis, improving:s/p perc tube 11/16, WBC normalized.Enterobacter from perc tube culture, sensitive to bactrim.Vitals have been stable with BP low-normal. -Perc tube producing 1L 11/24, IR re-consulted this AM 11/25. -outpatient IR f/u for perc tube  -Continue Bactrim through November 29 for 14-day antibiotic course -Zofran PRN nausea -Daily CBC, CMP  -PTrecommending SNF, OT recommending home health with intermittent supervision, 3 and 1 bedside commode  Cirrhosis ([place]s-Pugh 2/3), 2/2 previous EtOH use and HepC (treated): on torsemide and spiro, exam stable today, minimal abd  pain. -Hold torsemide 20mg  QD 11/25 d/t AKI (Cr. 1.37 > 1.51 11/25). Restart Torsemide 20 on 11/26. -Reduce spiro to 50mg  from 100mg   -Continue lactulose  Paroxysmalatrial tachycardiaand atrial fibwith pacemaker,improved: no more afib, Cards d/c amio 11/23.  -d/c Cardiac monitoring -Metoprolol 25 mg BID  Lower Extremity Swelling: most likely related to cirrhosis. Improving with compression stockings.  -Torsemide 20mg  held 11/25, can restart 11/26 -Spironolactone reduced to 50 mg (from 100)  Chronic HFpEFstable: no hypervolemia on exam today.  -Metoprolol 25mg  daily -Strict I&Os -Fluid restrictionto 1500 mL daily -Weightoday -Torsemide 20mg  (held 11/25, restart 11/26) -Holding home losartan &statin -Elevate legs, compression stockings.  AKI, worsening:Cr slowly increasing from baseline of 0.9 to 1.37>1.51 on 11/25. Limited by needing diuretics for ascites.  -Avoid nephrotoxic medications as possible  -daily BMP   CAD s/p CABG in 2004, stable:EKG without ischemic changes, troponin <0.03.  -Hold home statin, not on secondary prevention with aspirin daily given previous GI bleed and severe liver cirrhosis.  Back pain: pt thinks 2/2 bed, no abdominal pain to suggest c/w worsening ascites.  -lidocaine patch -kpad  FEN/GI:Fluids stopped;Regular diet, colace/miralax HTD:SKAJGOT subq given platelet level >50,000   Disposition: Will be medically cleared for DC to SNF once cholecystostomy is investigated, Cr improves, and diuretics regimen is stable.  Subjective:  The patient is seen sitting upright in bed, he has finished eating breakfast. He has no new concerns or complaints.  Objective: Temp:  [97.9 F (36.6 C)-98.7 F (37.1 C)] 97.9 F (36.6 C) (11/25 0529) Pulse Rate:  [57-64] 62 (11/25 0529) Resp:  [16-20] 16 (11/25 0529) BP: (94-96)/(65-67) 95/67 (11/25 0529) SpO2:  [90 %-96 %] 94 % (11/25 0529)  Physical Exam  Constitutional: He is oriented to  person, place, and time. He appears well-developed and well-nourished.  Cardiovascular: Normal rate, regular rhythm and normal heart sounds.  Pulmonary/Chest: Effort normal and breath sounds normal.  Abdominal: Bowel sounds are normal.  He exhibits distension. There is no rebound.  Cholecystostomy tube in place draining 1 L  Musculoskeletal: He exhibits edema (2+ pitting in bilateral lower extremities).  Neurological: He is alert and oriented to person, place, and time.  Skin: Skin is warm and dry. Capillary refill takes less than 2 seconds.  Psychiatric: He has a normal mood and affect.   Laboratory: Recent Labs  Lab 09/05/18 0414 09/06/18 0602 09/07/18 0410  WBC 7.1 6.9 7.5  HGB 12.4* 12.9* 12.7*  HCT 36.7* 39.0 37.5*  PLT 64* 68* 73*   Recent Labs  Lab 09/02/18 0946  09/05/18 0414 09/06/18 0602 09/07/18 0410  NA 128*   < > 131* 131* 132*  K 3.9   < > 3.0* 3.2* 4.1  CL 98   < > 100 96* 101  CO2 23   < > 26 27 27   BUN 10   < > 13 14 15   CREATININE 0.91   < > 1.32* 1.26* 1.37*  CALCIUM 7.8*   < > 7.4* 7.6* 7.8*  PROT 7.7  --   --   --   --   BILITOT 3.0*  --   --   --   --   ALKPHOS 82  --   --   --   --   ALT 15  --   --   --   --   AST 36  --   --   --   --   GLUCOSE 95   < > 99 102* 91   < > = values in this interval not displayed.   Imaging/Diagnostic Tests: No new imaging  Daisy Floro, DO 09/08/2018, 7:45 AM PGY-1, [place] Intern pager: [phone], text pages welcome

## 2018-09-08 NOTE — Progress Notes (Signed)
Daily Progress Note   Patient Name: Hunter Carter       Date: 09/08/2018 DOB: [birth year]  Age: 67 y.o. MRN#: [id number] Attending Physician: Martyn Malay, MD Primary Care Physician: Nuala Alpha, DO Admit Date: 08/30/2018  Reason for Consultation/Follow-up: Establishing goals of care  Subjective: Patient is resting in bed with eyes closed and tv on. He opens them to voice. He states he has been having back pain at night. He states the Lidocaine patch that is in place is easing the pain. He has no other complaints. He closes his eyes back and states he would like to take a nap.  Recommend palliative at D/C. Recommend HH case Automotive engineer.   Length of Stay: 9  Current Medications: Scheduled Meds:  . docusate sodium  100 mg Oral BID  . feeding supplement (ENSURE ENLIVE)  237 mL Oral TID BM  . heparin  5,000 Units Subcutaneous Q8H  . lactulose  30 g Oral Daily  . lidocaine  1 patch Transdermal Q24H  . metoprolol tartrate  25 mg Oral BID  . ondansetron (ZOFRAN) IV  4 mg Intravenous Once  . polyethylene glycol  17 g Oral Daily  . [START ON 09/09/2018] spironolactone  50 mg Oral Daily  . sulfamethoxazole-trimethoprim  1 tablet Oral Q12H    Continuous Infusions:   PRN Meds: acetaminophen, albuterol, benzonatate, lip balm, ramelteon  Physical Exam  Constitutional: No distress.  Pulmonary/Chest: Effort normal.  Neurological: He is alert.            Vital Signs: BP 95/67 (BP Location: Left Arm)   Pulse 62   Temp 97.9 F (36.6 C) (Oral)   Resp 16   Ht 5\' 6"  (1.676 m)   Wt 92.4 kg   SpO2 94%   BMI 32.88 kg/m  SpO2: SpO2: 94 % O2 Device: O2 Device: Room Air O2 Flow Rate: O2 Flow Rate (L/min): 2 L/min  Intake/output summary:   Intake/Output Summary  (Last 24 hours) at 09/08/2018 1237 Last data filed at 09/08/2018 1100 Gross per 24 hour  Intake 280 ml  Output 1200 ml  Net -920 ml   LBM: Last BM Date: 09/08/18 Baseline Weight: Weight: 96.6 kg Most recent weight: Weight: 92.4 kg       Palliative Assessment/Data:      Patient Active Problem List  Diagnosis Date Noted  . Abdominal distension   . Sepsis due to Streptococcus species ([place])   . Mobitz II   . Hyperkalemia   . Severe sepsis ([place]) 08/30/2018  . Boil of buttock 08/20/2018  . Cholecystitis, acute 08/20/2018  . Abdominal pain 07/20/2018  . Acute hepatic encephalopathy 03/20/2018  . Gastroesophageal reflux disease 03/11/2018  . CAD of autologous vein bypass graft without angina 03/01/2018  . Healthcare maintenance 02/13/2018  . Skin lesion 02/13/2018  . Cirrhosis ([place]) 08/08/2016  . Paroxysmal atrial fibrillation ([place]) 01/05/2016  . Tobacco abuse 01/05/2016  . Hepatic encephalopathy ([place]) 10/10/2015  . Pain, joint, ankle, left 10/10/2015  . Thrombocytopathia ([place]) 10/10/2015  . HLD (hyperlipidemia) 10/10/2015  . Chronic diastolic congestive heart failure ([place]) 10/10/2015  . Altered mental status   . Increased ammonia level   . Leg swelling   . Encephalopathy acute 08/01/2015  . GI bleed 07/29/2015  . Acute GI bleeding 07/29/2015  . Acute respiratory failure ([place])   . Bleeding gastrointestinal   . Hemorrhagic shock ([place])   . CAD s/p CABG 2004 07/20/2013  . Pacemaker - dual chamber Medtronic 2011 07/20/2013  . Hepatitis C 07/20/2013  . Hyperlipidemia 07/20/2013  . PAT (paroxysmal atrial tachycardia) ([place]) 07/20/2013  . Obesity (BMI 30-39.9) 07/20/2013  . NSVT (nonsustained ventricular tachycardia) ([place]) 07/20/2013  . HTN (hypertension) 07/16/2013    Palliative Care Assessment & Plan    Recommendations/Plan: He does not wish to discuss goals of care until he has spoken with his children first.   Palliative outpatient  HH CM or SW.   Code  Status:    Code Status Orders  (From admission, onward)         Start     Ordered   08/30/18 1727  Do not attempt resuscitation (DNR)  Continuous    Question Answer Comment  In the event of cardiac or respiratory ARREST Do not call a "code blue"   In the event of cardiac or respiratory ARREST Do not perform Intubation, CPR, defibrillation or ACLS   In the event of cardiac or respiratory ARREST Use medication by any route, position, wound care, and other measures to relive pain and suffering. May use oxygen, suction and manual treatment of airway obstruction as needed for comfort.   Comments confirmed with patient and Dr. Higinio Plan on admit      08/30/18 1726        Code Status History    Date Active Date Inactive Code Status Order ID Comments User Context   08/30/2018 1359 08/30/2018 1726 DNR [id number]  Sela Hilding, MD ED   07/20/2018 2218 07/24/2018 1917 Full Code [id number]  Rise Patience, MD Inpatient   03/20/2018 2219 03/22/2018 2033 Full Code [id number]  Rise Patience, MD ED   09/07/2017 2301 09/09/2017 1830 Full Code [id number]  Rise Patience, MD ED   10/10/2015 1940 10/13/2015 1642 Full Code [id number]  Ivor Costa, MD ED   07/29/2015 0345 08/04/2015 1851 Full Code [id number]  Corey Harold, NP ED       Prognosis:   Poor longterm.  Discharge Planning:  Home with Palliative Services   Thank you for allowing the Palliative Medicine Team to assist in the care of this patient.   Total Time 15 min Prolonged Time Billed  no      Greater than 50%  of this time was spent counseling and coordinating care related to the above assessment and plan.  Asencion Gowda, NP  Please contact  Palliative Medicine Team phone at [phone] for questions and concerns.

## 2018-09-08 NOTE — Progress Notes (Signed)
Occupational Therapy Treatment Patient Details Name: Hunter Carter MRN: [id number] DOB: 10-17-1950 Today's Date: 09/08/2018    History of present illness Pt is a 67 y.o. male admitted 08/30/18 with abdominal pain; worked up for sepsis secondary to acute cholecystitis. S/p percutaneous cholecystostomy on 11/16 and subsequent paracenteses on 11/16 and 11/19. Of note recent admission 07/2018 with acute cholecystitis. PMH includes liver cirrhosis, Hep-C, HF, a-fib, CAD (s/p CABG), pacemaker.    OT comments  Pt continues to make steady progress toward increasing independence with mobility and ADL. Pt ambulating with RW @ S level with VSS  (O2 94 RA) and is able to complete ADL with set up with use of AE. Plan is for pt to DC to SNF for rehab to maxmize independence with ADL, IADL tasks and functional mobility to facilitate safe DC home alone.   Follow Up Recommendations  SNF    Equipment Recommendations  3 in 1 bedside commode    Recommendations for Other Services      Precautions / Restrictions Precautions Precautions: Fall Precaution Comments: abdominal percutaneous drain       Mobility Bed Mobility Overal bed mobility: Modified Independent                Transfers Overall transfer level: Needs assistance Equipment used: Rolling walker (2 wheeled) Transfers: Sit to/from Stand Sit to Stand: Supervision              Balance Overall balance assessment: Needs assistance   Sitting balance-Leahy Scale: Good       Standing balance-Leahy Scale: Fair Standing balance comment: Able to release RW to complete ADL                           ADL either performed or assessed with clinical judgement   ADL Overall ADL's : Needs assistance/impaired Eating/Feeding: Modified independent   Grooming: Modified independent;Standing   Upper Body Bathing: Set up;Sitting   Lower Body Bathing: Minimal assistance;Sit to/from stand Lower Body Bathing Details (indicate  cue type and reason): set up with use of AE Upper Body Dressing : Set up;Sitting   Lower Body Dressing: Minimal assistance Lower Body Dressing Details (indicate cue type and reason): setup with AE Toilet Transfer: Supervision/safety;Ambulation;RW;Regular Toilet;Grab bars   Toileting- Clothing Manipulation and Hygiene: Modified independent       Functional mobility during ADLs: Supervision/safety;Rolling walker       Vision       Perception     Praxis      Cognition Arousal/Alertness: Awake/alert Behavior During Therapy: WFL for tasks assessed/performed Overall Cognitive Status: Within Functional Limits for tasks assessed                                          Exercises     Shoulder Instructions       General Comments  Improved activity tolerance; able to walk while carrying on conversation without SOB    Pertinent Vitals/ Pain       Pain Assessment: 0-10 Pain Score: 4  Pain Location: back Pain Descriptors / Indicators: Discomfort Pain Intervention(s): Limited activity within patient's tolerance  Home Living  Prior Functioning/Environment              Frequency  Min 2X/week        Progress Toward Goals  OT Goals(current goals can now be found in the care plan section)  Progress towards OT goals: Progressing toward goals  Acute Rehab OT Goals Patient Stated Goal: Return home; get back to [facility] and volunteering at elementary school OT Goal Formulation: With patient Time For Goal Achievement: 09/18/18 Potential to Achieve Goals: Good ADL Goals Pt Will Perform Grooming: with modified independence;standing Pt Will Perform Lower Body Bathing: with modified independence;with adaptive equipment;sit to/from stand Pt Will Perform Lower Body Dressing: with modified independence;with adaptive equipment;sit to/from stand  Plan Discharge plan needs to be updated     Co-evaluation                 AM-PAC OT "6 Clicks" Daily Activity     Outcome Measure   Help from another person eating meals?: None Help from another person taking care of personal grooming?: None Help from another person toileting, which includes using toliet, bedpan, or urinal?: A Little Help from another person bathing (including washing, rinsing, drying)?: A Little Help from another person to put on and taking off regular upper body clothing?: None Help from another person to put on and taking off regular lower body clothing?: A Little 6 Click Score: 21    End of Session Equipment Utilized During Treatment: Gait belt;Rolling walker  OT Visit Diagnosis: Unsteadiness on feet (R26.81);Pain;Muscle weakness (generalized) (M62.81) Pain - part of body: (back)   Activity Tolerance Patient tolerated treatment well   Patient Left Other (comment)(on toilet - nsg made aware)   Nurse Communication Mobility status;Other (comment)(pt in bathroom)        Time: 6712-4580 OT Time Calculation (min): 14 min  Charges: OT General Charges $OT Visit: 1 Visit OT Treatments $Self Care/Home Management : 8-22 mins  Maurie Boettcher, OT/L   Acute OT Clinical Specialist [place] Pager [phone] Office [phone]    [facility] 09/08/2018, 4:04 PM

## 2018-09-08 NOTE — Progress Notes (Signed)
Physical Therapy Treatment Patient Details Name: Hunter Carter MRN: [id number] DOB: 1951-08-08 Today's Date: 09/08/2018    History of Present Illness Pt is a 67 y.o. male admitted 08/30/18 with abdominal pain; worked up for sepsis secondary to acute cholecystitis. S/p percutaneous cholecystostomy on 11/16 and subsequent paracenteses on 11/16 and 11/19. Of note recent admission 07/2018 with acute cholecystitis. PMH includes liver cirrhosis, Hep-C, HF, a-fib, CAD (s/p CABG), pacemaker.     PT Comments    Pt was in bed asleep upon arrival but easily aroused. He stated that he "already walked today with therapy and was too tired to do it again". Pt agreeable to supine excercises this session and tolerated treatment well. He is making progress toward stated goals. Pt would benefit from continued PT in order to maximize functional independence and increase overall strength. Pt remains appropriate for SNF based on current functional status.    Follow Up Recommendations  SNF;Supervision - Intermittent     Equipment Recommendations  Rolling walker with 5" wheels    Recommendations for Other Services       Precautions / Restrictions Precautions Precautions: Fall Precaution Comments: abdominal percutaneous drain Restrictions Weight Bearing Restrictions: No    Mobility  Bed Mobility             General bed mobility comments: deferred                                            Wheelchair Mobility    Modified Rankin (Stroke Patients Only)       Balance Overall balance assessment: deferred this session                                       Cognition Arousal/Alertness: Awake/alert Behavior During Therapy: WFL for tasks assessed/performed Overall Cognitive Status: Within Functional Limits for tasks assessed                                 General Comments: Pt in bed taking a nap upon arrival. Pt stated that he  "already walked today with therapy and was too tired to do it again". Pt agreeable to supine excercises this session      Exercises Total Joint Exercises Ankle Circles/Pumps: 15 reps;Right;Left;Seated;AROM;Supine Quad Sets: AROM;10 reps;Supine;Right;Left Gluteal Sets: AROM;10 reps;Right;Left;Supine Heel Slides: AROM;10 reps;Right;Left;Supine Hip ABduction/ADduction: AROM;10 reps;Right;Left;Supine(substitutions noted and corrected) Straight Leg Raises: AROM;10 reps;Right;Left;Supine    General Comments        Pertinent Vitals/Pain Pain Assessment: 0-10 Pain Score: 4  Faces Pain Scale: Hurts a little bit Pain Location: back Pain Descriptors / Indicators: Discomfort Pain Intervention(s): Limited activity within patient's tolerance;Monitored during session;Repositioned    Home Living                      Prior Function            PT Goals (current goals can now be found in the care plan section) Acute Rehab PT Goals Patient Stated Goal: Return home; get back to [facility] and volunteering at elementary school PT Goal Formulation: With patient Time For Goal Achievement: 09/18/18 Potential to Achieve Goals: Good Progress towards PT goals: Progressing toward goals    Frequency    Min 3X/week  PT Plan Current plan remains appropriate    Co-evaluation              AM-PAC PT "6 Clicks" Mobility   Outcome Measure  Help needed turning from your back to your side while in a flat bed without using bedrails?: A Little Help needed moving from lying on your back to sitting on the side of a flat bed without using bedrails?: A Little Help needed moving to and from a bed to a chair (including a wheelchair)?: A Little Help needed standing up from a chair using your arms (e.g., wheelchair or bedside chair)?: A Little Help needed to walk in hospital room?: A Little Help needed climbing 3-5 steps with a railing? : A Little 6 Click Score: 18    End of Session    Activity Tolerance: Patient limited by fatigue Patient left: in bed;with call bell/phone within reach;with bed alarm set Nurse Communication: Mobility status PT Visit Diagnosis: Other abnormalities of gait and mobility (R26.89)     Time: 4010-2725 PT Time Calculation (min) (ACUTE ONLY): 12 min  Charges:  $Therapeutic Exercise: 8-22 mins                    [place], [place] 09/08/2018, 5:11 PM

## 2018-09-09 DIAGNOSIS — N183 Chronic kidney disease, stage 3 (moderate): Secondary | ICD-10-CM

## 2018-09-09 LAB — COMPREHENSIVE METABOLIC PANEL
ALBUMIN: 1.5 g/dL — AB (ref 3.5–5.0)
ALT: 17 U/L (ref 0–44)
AST: 42 U/L — AB (ref 15–41)
Alkaline Phosphatase: 79 U/L (ref 38–126)
Anion gap: 5 (ref 5–15)
BUN: 15 mg/dL (ref 8–23)
CO2: 27 mmol/L (ref 22–32)
CREATININE: 1.35 mg/dL — AB (ref 0.61–1.24)
Calcium: 8.1 mg/dL — ABNORMAL LOW (ref 8.9–10.3)
Chloride: 98 mmol/L (ref 98–111)
GFR calc Af Amer: >60 mL/min (ref >60)
GFR calc non Af Amer: 54 mL/min — ABNORMAL LOW (ref >60)
GLUCOSE: 80 mg/dL (ref 70–99)
Potassium: 5.1 mmol/L (ref 3.5–5.1)
SODIUM: 130 mmol/L — AB (ref 135–145)
Total Bilirubin: 3.2 mg/dL — ABNORMAL HIGH (ref 0.3–1.2)
Total Protein: 8.3 g/dL — ABNORMAL HIGH (ref 6.5–8.1)

## 2018-09-09 LAB — CBC
HCT: 41.2 % (ref 39.0–52.0)
HEMOGLOBIN: 13.6 g/dL (ref 13.0–17.0)
MCH: 31.7 pg (ref 26.0–34.0)
MCHC: 33 g/dL (ref 30.0–36.0)
MCV: 96 fL (ref 80.0–100.0)
NRBC: 0.5 % — AB (ref 0.0–0.2)
Platelets: 77 10*3/uL — ABNORMAL LOW (ref 150–400)
RBC: 4.29 MIL/uL (ref 4.22–5.81)
RDW: 15.9 % — ABNORMAL HIGH (ref 11.5–15.5)
WBC: 7.9 10*3/uL (ref 4.0–10.5)

## 2018-09-09 MED ORDER — ONDANSETRON HCL 4 MG PO TABS
4.0000 mg | ORAL_TABLET | Freq: Once | ORAL | Status: AC
  Administered 2018-09-09: 4 mg via ORAL
  Filled 2018-09-09: qty 1

## 2018-09-09 MED ORDER — TORSEMIDE 20 MG PO TABS
20.0000 mg | ORAL_TABLET | ORAL | Status: DC
  Administered 2018-09-09: 20 mg via ORAL
  Filled 2018-09-09: qty 1

## 2018-09-09 NOTE — Care Management Important Message (Signed)
Important Message  Patient Details  Name: Hunter Carter MRN: [id number] Date of Birth: 04/22/1951   Medicare Important Message Given:  Yes    [name] 09/09/2018, 4:05 PM

## 2018-09-09 NOTE — Progress Notes (Signed)
Physical Therapy Treatment Patient Details Name: Hunter Carter MRN: [id number] DOB: 1951/08/07 Today's Date: 09/09/2018    History of Present Illness Pt is a 67 y.o. male admitted 08/30/18 with abdominal pain; worked up for sepsis secondary to acute cholecystitis. S/p percutaneous cholecystostomy on 11/16 and subsequent paracenteses on 11/16 and 11/19. Of note recent admission 07/2018 with acute cholecystitis. PMH includes liver cirrhosis, Hep-C, HF, a-fib, CAD (s/p CABG), pacemaker.     PT Comments    Pt in bed upon arrival and agreeable to therapy. Pt participated in gait and seated LE strengthening exercises this session. Pt requiring Min guard for gait, but required Min A for turns for safety. Pt's O2 95% RA. Pt remains motivated and is progressing toward stated goals. Pt would benefit from continued PT in order maximize functional independence and increase overall strength. Pt remains appropriate for discharge to SNF based on current mobility status.    Follow Up Recommendations  SNF;Supervision - Intermittent     Equipment Recommendations  Rolling walker with 5" wheels    Recommendations for Other Services       Precautions / Restrictions Precautions Precautions: Fall Precaution Comments: abdominal percutaneous drain Restrictions Weight Bearing Restrictions: No    Mobility  Bed Mobility Overal bed mobility: Modified Independent                Transfers Overall transfer level: Needs assistance Equipment used: Rolling walker (2 wheeled) Transfers: Sit to/from Stand Sit to Stand: Supervision         General transfer comment: vc for safe hand placement and supervision for safety  Ambulation/Gait Ambulation/Gait assistance: Min guard;Min assist Gait Distance (Feet): 95 Feet Assistive device: Rolling walker (2 wheeled) Gait Pattern/deviations: Step-through pattern;Decreased weight shift to right     General Gait Details: Slow, steady ambulation with RW.   Min assist for safety during turns. Intermittent cues to maintain upright posture and for proper hand placement . O2 on RA reading 95%.   Stairs             Wheelchair Mobility    Modified Rankin (Stroke Patients Only)       Balance Overall balance assessment: Needs assistance Sitting-balance support: No upper extremity supported;Feet supported Sitting balance-Leahy Scale: Good Sitting balance - Comments: Able to do LE exercises with on UE support     Standing balance-Leahy Scale: Fair Standing balance comment: reliant on RW for support                            Cognition Arousal/Alertness: Awake/alert Behavior During Therapy: WFL for tasks assessed/performed Overall Cognitive Status: Within Functional Limits for tasks assessed                                 General Comments: Pt was motivated and willing to participate in therapy. He stated that is daughter was coming to visit him today.      Exercises Total Joint Exercises Ankle Circles/Pumps: 15 reps;Right;Left;Seated;AROM Hip ABduction/ADduction: AROM;10 reps;Right;Left;Seated Long Arc Quad: AROM;Right;Left;10 reps;Seated Marching in Standing: AROM;10 reps;Left;Right;Seated    General Comments General comments (skin integrity, edema, etc.): Biliary drain was full - RN notified.      Pertinent Vitals/Pain Pain Assessment: No/denies pain    Home Living                      Prior Function  PT Goals (current goals can now be found in the care plan section) Acute Rehab PT Goals Patient Stated Goal: Return home; get back to [facility] and volunteering at elementary school PT Goal Formulation: With patient Time For Goal Achievement: 09/18/18 Potential to Achieve Goals: Good Progress towards PT goals: Progressing toward goals    Frequency    Min 3X/week      PT Plan Current plan remains appropriate    Co-evaluation              AM-PAC PT "6 Clicks"  Mobility   Outcome Measure  Help needed turning from your back to your side while in a flat bed without using bedrails?: A Little Help needed moving from lying on your back to sitting on the side of a flat bed without using bedrails?: A Little Help needed moving to and from a bed to a chair (including a wheelchair)?: A Little Help needed standing up from a chair using your arms (e.g., wheelchair or bedside chair)?: A Little Help needed to walk in hospital room?: A Little Help needed climbing 3-5 steps with a railing? : A Little 6 Click Score: 18    End of Session Equipment Utilized During Treatment: Gait belt Activity Tolerance: Patient tolerated treatment well Patient left: in bed;with call bell/phone within reach;with bed alarm set Nurse Communication: Mobility status PT Visit Diagnosis: Other abnormalities of gait and mobility (R26.89)     Time: 2023-3435 PT Time Calculation (min) (ACUTE ONLY): 15 min  Charges:  $Gait Training: 8-22 mins                     [address] [place] 09/09/2018, 12:02 PM

## 2018-09-09 NOTE — Consult Note (Addendum)
   [facility] Inpatient Consult   09/09/2018  Hunter Carter July 08, 1951 [id number]   Patient chart has been reviewed for high risk score 22% for unplanned readmissions and multiple hospitalizations.    Chart review reveals patient disposition plans are for SNF.   No [facility] Care Management needs at this time.  Netta Cedars, MSN, [facility] Liaison Nurse Mobile Phone [phone]  Toll free office [phone]

## 2018-09-09 NOTE — Progress Notes (Addendum)
Family Medicine Teaching Service Daily Progress Note Intern Pager: [phone]  Patient name: Hunter Carter Medical record number: [id number] Date of birth: 10/27/1950 Age: 67 y.o. Gender: male  Primary Care Provider: Nuala Alpha, DO Consultants:IR, cards, surgery Code Status:DNR  Pt Overview and Major Events to Date: 11/16 admitted with sepsis 2/2 cholecystitis, perc cholecystostomy tube placed by IR 11/17continued abd pain 11/18atrial tachycardia to rate of 180, cardiology consulted, placed on IV amiodarone and metoprolol 11/20 cefepime, Flagyl, vancomycin transitioned to oral Bactrim 11/21atrial tachycardialess frequent,developedan AKI likely due to torsemide 11/23atrial tachycardia resolved,amiodarone stopped,still some volume overload, waiting for SNF placement  Assessment and Plan: Hunter Carter a 67 y.o.male who presented with sepsis secondary to cholecystitis s/p percutaneous cholecystostomy tube, sepsis now resolved. Fluid around cholecystostomy and ascitic fluid grew Enterobacter. PMH is significant forHTN, CAD s/p CABG in 2004 with pacemaker in place, paroxysmal atrial tachycardia, liver cirrhosis 2/2 chronic alcohol abuse and treated Hep C with previous hepatic encephalopathy, chronic diastolic CHF, and paroxysmal atrial fibrillation.   Sepsis 2/2 acute cholecystitis, improving:s/p perc tube 11/16, WBC normalized.Enterobacterfrom perc tube culture, sensitive to bactrim.Vitals have been stable with BP low-normal. -Perc tube producing 825 cc 11/25, IR reconsulted 11/25 states fluid being drained is bilious content. -outpatient IR f/u for perc tube -Continue Bactrim through November 29 for 14-day antibiotic course -Zofran PRN nausea -Daily CBC, CMP  -PTrecommending SNF, OT recommending home health with intermittent supervision, 3 and 1 bedside commode  Cirrhosis (Childs-Pugh 2/3), 2/2 previous EtOH use and HepC (treated): on torsemide and  spiro, exam stable today, minimal abd pain. -Restart torsemide 20mg  QD (Cr. 1.51>1.35 11/26). -Reduced spiro to 50mg   -Continue lactulose  Paroxysmalatrial tachycardiaand atrial fibwith pacemaker,improved:no more afib, Cards d/c amio 11/23. -d/cCardiac monitoring -Metoprolol 25 mg BID  Lower Extremity Swelling: most likely related to cirrhosis.Improving with compression stockings. -Torsemide 20mg   every other day -Continue Spironolactone reduced to 50 mg -Daily weights - goal is 90kg or less -BMP on close outpt Follow Up Appt  Chronic HFpEFstable:Trace bilateral lower extremity edema, faint crackles on lung exam. -Metoprolol 25mg  daily -Strict I&Os -Fluid restrictionto 1500 mL daily -Weightoday -Torsemide 20mg  every other day -Holding home losartan &statin -Elevate legs, compression stockings.  AKI, improving:Cr slowly increasing from baseline of 0.9 to 1.37>1.51>1.35 on 11/26. Limited by needing diuretics for ascites. -Avoid nephrotoxic medications as possible  -daily BMP   CAD s/p CABG in 2004, stable:EKG without ischemic changes, troponin <0.03.  -Hold home statin, not on secondary prevention with aspirin daily given previous GI bleed and severe liver cirrhosis.  Back pain: pt thinks 2/2 bed, no abdominal pain to suggest c/w worsening ascites.  -lidocaine patch -kpad  FEN/GI:Fluids stopped;Regular diet, colace/miralax TMH:DQQIWLN subq given platelet level >50,000  Disposition:Patient is medically cleared for discharge to SNF [facility]), Insurance is Pending  Subjective:  The patient is seen resting in bed this morning.  He states he feels fine, has a little bit of abdominal tenderness.  Denies shortness of breath, chest pain, nausea, vomiting, and worsening lower extremity edema  Objective: Temp:  [97.4 F (36.3 C)-98 F (36.7 C)] 98 F (36.7 C) (11/26 0425) Pulse Rate:  [59-63] 59 (11/26 0425) Resp:  [16-18] 18 (11/26 0425) BP:  (94-99)/(63-69) 94/63 (11/26 0425) SpO2:  [93 %-94 %] 94 % (11/26 0425) Physical Exam: General: Sitting upright in bed, in no apparent distress Cardiovascular: Regular rate rhythm, S1-S2, no murmurs rubs or gallops Respiratory: Mild crackles to bilateral lung bases Abdomen: Slightly distended, stable; normal bowel sounds, nontender Extremities:  Trace edema to bilateral lower extremities; no compression stockings on this morning  Laboratory: Recent Labs  Lab 09/07/18 0410 09/08/18 0950 09/09/18 0519  WBC 7.5 5.8 7.9  HGB 12.7* 13.6 13.6  HCT 37.5* 40.9 41.2  PLT 73* 81* 77*   Recent Labs  Lab 09/02/18 0946  09/07/18 0410 09/08/18 0950 09/09/18 0519  NA 128*   < > 132* 133* 130*  K 3.9   < > 4.1 4.5 5.1  CL 98   < > 101 99 98  CO2 23   < > 27 30 27   BUN 10   < > 15 16 15   CREATININE 0.91   < > 1.37* 1.51* 1.35*  CALCIUM 7.8*   < > 7.8* 8.2* 8.1*  PROT 7.7  --   --  8.7* 8.3*  BILITOT 3.0*  --   --  3.0* 3.2*  ALKPHOS 82  --   --  79 79  ALT 15  --   --  18 17  AST 36  --   --  40 42*  GLUCOSE 95   < > 91 135* 80   < > = values in this interval not displayed.   Imaging/Diagnostic Tests: No new imaging  Daisy Floro, DO 09/09/2018, 9:19 AM PGY-1, [place] Intern pager: [phone], text pages welcome

## 2018-09-10 ENCOUNTER — Other Ambulatory Visit: Payer: Self-pay | Admitting: Surgery

## 2018-09-10 ENCOUNTER — Other Ambulatory Visit: Payer: Self-pay | Admitting: Family Medicine

## 2018-09-10 DIAGNOSIS — R1312 Dysphagia, oropharyngeal phase: Secondary | ICD-10-CM | POA: Diagnosis not present

## 2018-09-10 DIAGNOSIS — K7469 Other cirrhosis of liver: Secondary | ICD-10-CM | POA: Diagnosis not present

## 2018-09-10 DIAGNOSIS — R2689 Other abnormalities of gait and mobility: Secondary | ICD-10-CM | POA: Diagnosis not present

## 2018-09-10 DIAGNOSIS — K810 Acute cholecystitis: Secondary | ICD-10-CM

## 2018-09-10 DIAGNOSIS — A419 Sepsis, unspecified organism: Secondary | ICD-10-CM | POA: Diagnosis not present

## 2018-09-10 DIAGNOSIS — E871 Hypo-osmolality and hyponatremia: Secondary | ICD-10-CM

## 2018-09-10 DIAGNOSIS — I5030 Unspecified diastolic (congestive) heart failure: Secondary | ICD-10-CM | POA: Diagnosis not present

## 2018-09-10 DIAGNOSIS — I25810 Atherosclerosis of coronary artery bypass graft(s) without angina pectoris: Secondary | ICD-10-CM | POA: Diagnosis not present

## 2018-09-10 DIAGNOSIS — R188 Other ascites: Secondary | ICD-10-CM | POA: Diagnosis not present

## 2018-09-10 DIAGNOSIS — K7460 Unspecified cirrhosis of liver: Secondary | ICD-10-CM | POA: Diagnosis not present

## 2018-09-10 DIAGNOSIS — K7290 Hepatic failure, unspecified without coma: Secondary | ICD-10-CM | POA: Diagnosis not present

## 2018-09-10 DIAGNOSIS — K819 Cholecystitis, unspecified: Secondary | ICD-10-CM | POA: Diagnosis not present

## 2018-09-10 DIAGNOSIS — N183 Chronic kidney disease, stage 3 (moderate): Secondary | ICD-10-CM | POA: Diagnosis not present

## 2018-09-10 DIAGNOSIS — M6281 Muscle weakness (generalized): Secondary | ICD-10-CM | POA: Diagnosis not present

## 2018-09-10 DIAGNOSIS — R0602 Shortness of breath: Secondary | ICD-10-CM | POA: Diagnosis not present

## 2018-09-10 DIAGNOSIS — A409 Streptococcal sepsis, unspecified: Secondary | ICD-10-CM | POA: Diagnosis not present

## 2018-09-10 DIAGNOSIS — R2681 Unsteadiness on feet: Secondary | ICD-10-CM | POA: Diagnosis not present

## 2018-09-10 LAB — CBC
HCT: 39.7 % (ref 39.0–52.0)
Hemoglobin: 13.3 g/dL (ref 13.0–17.0)
MCH: 31.7 pg (ref 26.0–34.0)
MCHC: 33.5 g/dL (ref 30.0–36.0)
MCV: 94.7 fL (ref 80.0–100.0)
NRBC: 0.3 % — AB (ref 0.0–0.2)
Platelets: 82 10*3/uL — ABNORMAL LOW (ref 150–400)
RBC: 4.19 MIL/uL — ABNORMAL LOW (ref 4.22–5.81)
RDW: 15.9 % — AB (ref 11.5–15.5)
WBC: 7.4 10*3/uL (ref 4.0–10.5)

## 2018-09-10 LAB — COMPREHENSIVE METABOLIC PANEL
ALT: 19 U/L (ref 0–44)
AST: 41 U/L (ref 15–41)
Albumin: 1.4 g/dL — ABNORMAL LOW (ref 3.5–5.0)
Alkaline Phosphatase: 80 U/L (ref 38–126)
Anion gap: 5 (ref 5–15)
BUN: 15 mg/dL (ref 8–23)
CHLORIDE: 96 mmol/L — AB (ref 98–111)
CO2: 28 mmol/L (ref 22–32)
Calcium: 8.1 mg/dL — ABNORMAL LOW (ref 8.9–10.3)
Creatinine, Ser: 1.38 mg/dL — ABNORMAL HIGH (ref 0.61–1.24)
GFR, EST NON AFRICAN AMERICAN: 53 mL/min — AB (ref >60)
Glucose, Bld: 90 mg/dL (ref 70–99)
POTASSIUM: 4.3 mmol/L (ref 3.5–5.1)
Sodium: 129 mmol/L — ABNORMAL LOW (ref 135–145)
Total Bilirubin: 3.1 mg/dL — ABNORMAL HIGH (ref 0.3–1.2)
Total Protein: 8.1 g/dL (ref 6.5–8.1)

## 2018-09-10 MED ORDER — SPIRONOLACTONE 50 MG PO TABS: 50.0000 mg | ORAL_TABLET | Freq: Every day | ORAL | 0 refills | Status: DC

## 2018-09-10 MED ORDER — SULFAMETHOXAZOLE-TRIMETHOPRIM 800-160 MG PO TABS: 1.0000 | ORAL_TABLET | Freq: Two times a day (BID) | ORAL | 0 refills | Status: DC

## 2018-09-10 MED ORDER — SULFAMETHOXAZOLE-TRIMETHOPRIM 800-160 MG PO TABS: 1.0000 | ORAL_TABLET | Freq: Two times a day (BID) | ORAL | 0 refills | Status: AC

## 2018-09-10 MED ORDER — TORSEMIDE 20 MG PO TABS: 20.0000 mg | ORAL_TABLET | ORAL | 0 refills | Status: DC

## 2018-09-10 MED ORDER — TORSEMIDE 20 MG PO TABS: 20.0000 mg | ORAL_TABLET | Freq: Every day | ORAL | 0 refills | Status: DC

## 2018-09-10 NOTE — Progress Notes (Signed)
Hunter Carter has received insurance approval for patient.   [name] LCSW [phone]

## 2018-09-10 NOTE — Progress Notes (Signed)
Referring Physician(s): Dr Clementeen Graham  Supervising Physician: Markus Daft  Patient Status:  [facility] - In-pt  Chief Complaint:  Perc chole drain placed 11/16 in IR  Subjective:  Perc chole drain intact OP 600 yesterday As much as 1 L OP few days ago Color is bile  Pt to Tx to OP Rehab today  Will need follow up in [facility]-- orders in place    Allergies: Patient has no known allergies.  Medications: Prior to Admission medications   Medication Sig Start Date End Date Taking? Authorizing Provider  furosemide (LASIX) 40 MG tablet TAKE 1 TABLET BY MOUTH  DAILY AS NEEDED Patient taking differently: Take 40 mg by mouth daily. Make take an additional 40 mg if needed 07/15/18  Yes Lockamy, Timothy, DO  lactulose (CHRONULAC) 10 GM/15ML solution Take 45 mLs (30 g total) by mouth 2 (two) times daily. Titrate for 3-4 BM's/ day Patient taking differently: Take 30 g by mouth 3 (three) times daily. Titrate for 3-4 BM's/ day 03/22/18  Yes Sheikh, Omair Latif, DO  losartan (COZAAR) 25 MG tablet TAKE 1 TABLET BY MOUTH  DAILY Patient taking differently: Take 25 mg by mouth daily.  04/30/18  Yes Lockamy, Timothy, DO  metoprolol tartrate (LOPRESSOR) 50 MG tablet TAKE 1 TABLET BY MOUTH TWO  TIMES DAILY Patient taking differently: Take 50 mg by mouth daily.  07/15/18  Yes Lockamy, Timothy, DO  omeprazole (PRILOSEC) 20 MG capsule TAKE 1 CAPSULE BY MOUTH  DAILY Patient taking differently: Take 20 mg by mouth daily.  07/15/18  Yes Lockamy, Timothy, DO  PROAIR HFA 108 (90 Base) MCG/ACT inhaler USE 2 PUFFS BY MOUTH EVERY  4 HOURS AS NEEDED FOR  WHEEZING OR SHORTNESS OF  BREATH (OR COUGHING) Patient taking differently: Inhale 1-2 puffs into the lungs every 4 (four) hours as needed for wheezing.  06/20/18  Yes Lockamy, Timothy, DO  simvastatin (ZOCOR) 20 MG tablet TAKE 1 TABLET BY MOUTH  DAILY Patient taking differently: Take 20 mg by mouth daily at 6 PM.  07/15/18  Yes Lockamy, Timothy, DO  spironolactone  (ALDACTONE) 50 MG tablet Take 1 tablet (50 mg total) by mouth daily. 09/10/18   Daisy Floro, DO  sulfamethoxazole-trimethoprim (BACTRIM DS,SEPTRA DS) 800-160 MG tablet Take 1 tablet by mouth every 12 (twelve) hours for 3 days. 09/10/18 09/13/18  Daisy Floro, DO  torsemide (DEMADEX) 20 MG tablet Take 1 tablet (20 mg total) by mouth daily. Hold if weight falls below 90kg. Check weight daily. 09/10/18 10/10/18  Daisy Floro, DO     Vital Signs: BP 103/63 (BP Location: Left Arm)   Pulse 64   Temp (!) 97.5 F (36.4 C) (Oral)   Resp 18   Ht 5\' 6"  (1.676 m)   Wt 208 lb 1.8 oz (94.4 kg)   SpO2 94%   BMI 33.59 kg/m   Physical Exam  Constitutional: He is oriented to person, place, and time.  Abdominal: Soft. Normal appearance and bowel sounds are normal. There is no tenderness.  Neurological: He is alert and oriented to person, place, and time.  Skin: Skin is warm and dry.  Site of drain is clean and dry NT no bleeding No sign of infection  Vitals reviewed.   Imaging: No results found.  Labs:  CBC: Recent Labs    09/07/18 0410 09/08/18 0950 09/09/18 0519 09/10/18 0319  WBC 7.5 5.8 7.9 7.4  HGB 12.7* 13.6 13.6 13.3  HCT 37.5* 40.9 41.2 39.7  PLT  73* 81* 77* 82*    COAGS: Recent Labs    07/22/18 0447 08/30/18 0705 08/31/18 0429 09/01/18 0602  INR 2.17 2.08 2.41 2.14    BMP: Recent Labs    09/07/18 0410 09/08/18 0950 09/09/18 0519 09/10/18 0319  NA 132* 133* 130* 129*  K 4.1 4.5 5.1 4.3  CL 101 99 98 96*  CO2 27 30 27 28   GLUCOSE 91 135* 80 90  BUN 15 16 15 15   CALCIUM 7.8* 8.2* 8.1* 8.1*  CREATININE 1.37* 1.51* 1.35* 1.38*  GFRNONAA 53* 47* 54* 53*  GFRAA >60 54* >60 >60    LIVER FUNCTION TESTS: Recent Labs    09/02/18 0946 09/08/18 0950 09/09/18 0519 09/10/18 0319  BILITOT 3.0* 3.0* 3.2* 3.1*  AST 36 40 42* 41  ALT 15 18 17 19   ALKPHOS 82 79 79 80  PROT 7.7 8.7* 8.3* 8.1  ALBUMIN 1.7* 1.5* 1.5* 1.4*    Assessment and  Plan:  Percutaneous cholecystostomy drain intact OP copious-- thin bile Will need flushed 5-10 cc daily Record OP daily IR will contact pt with OP appt time and date  Electronically Signed: Lavonia Drafts, PA-C 09/10/2018, 10:35 AM   I spent a total of 15 Minutes at the the patient's bedside AND on the patient's hospital floor or unit, greater than 50% of which was counseling/coordinating care for perc chole drain

## 2018-09-10 NOTE — Clinical Social Work Placement (Signed)
   CLINICAL SOCIAL WORK PLACEMENT  NOTE  Date:  09/10/2018  Patient Details  Name: Hunter Carter MRN: [id number] Date of Birth: Aug 17, 1951  Clinical Social Work is seeking post-discharge placement for this patient at the [place] level of care (*CSW will initial, date and re-position this form in  chart as items are completed):  Yes   Patient/family provided with [place] Work Department's list of facilities offering this level of care within the geographic area requested by the patient (or if unable, by the patient's family).  Yes   Patient/family informed of their freedom to choose among providers that offer the needed level of care, that participate in Medicare, Medicaid or managed care program needed by the patient, have an available bed and are willing to accept the patient.  Yes   Patient/family informed of [facility]'s ownership interest in [facility] and [facility], as well as of the fact that they are under no obligation to receive care at these facilities.  PASRR submitted to EDS on       PASRR number received on       Existing PASRR number confirmed on 09/08/18     FL2 transmitted to all facilities in geographic area requested by pt/family on 09/08/18     FL2 transmitted to all facilities within larger geographic area on       Patient informed that his/her managed care company has contracts with or will negotiate with certain facilities, including the following:        Yes   Patient/family informed of bed offers received.  Patient chooses bed at [facility]     Physician recommends and patient chooses bed at      Patient to be transferred to [facility] on 09/10/18.  Patient to be transferred to facility by PTAR     Patient family notified on 09/10/18 of transfer.  Name of family member notified:  Patient alerting family     PHYSICIAN Please prepare priority discharge summary, including medications, Please  sign DNR     Additional Comment:    _______________________________________________ Benard Halsted, LCSW 09/10/2018, 10:07 AM

## 2018-09-10 NOTE — Progress Notes (Signed)
Hunter Carter to be D/C'd to SNF per MD order.  Discussed with the patient and all questions fully answered.  VSS, Skin clean, dry and intact without evidence of skin break down, no evidence of skin tears noted. IV catheter discontinued intact. Site without signs and symptoms of complications. Dressing and pressure applied.  An After Visit Summary was printed and given to [facility]  D/c education completed with patient/family including follow up instructions, medication list, d/c activities limitations if indicated, with other d/c instructions as indicated by MD - patient able to verbalize understanding, all questions fully answered.   Patient instructed to return to ED, call 911, or call MD for any changes in condition.   Patient escorted via stretcher, and D/C to SNF via PTAR.  Morley Kos Price 09/10/2018 12:04 PM

## 2018-09-10 NOTE — Progress Notes (Signed)
[facility] will [facility]: [facility] Anticipated DC date: 09/10/18 Family notified: Patient oriented and will let his family know Transport by: Corey Harold    Per MD patient ready for DC to [facility]. RN, patient, patient's family, and facility notified of DC. Discharge Summary and FL2 sent to facility. RN to call report prior to discharge [phone] Room 704). DC packet on chart. Ambulance transport requested for patient.   CSW will sign off for now as social work intervention is no longer needed. Please consult [place] again if new needs arise.  Cedric Fishman, LCSW Clinical Social Worker [phone]

## 2018-09-16 ENCOUNTER — Inpatient Hospital Stay: Payer: Medicare Other | Admitting: Family Medicine

## 2018-09-16 DIAGNOSIS — K819 Cholecystitis, unspecified: Secondary | ICD-10-CM | POA: Diagnosis not present

## 2018-09-16 DIAGNOSIS — A419 Sepsis, unspecified organism: Secondary | ICD-10-CM | POA: Diagnosis not present

## 2018-09-16 DIAGNOSIS — K7460 Unspecified cirrhosis of liver: Secondary | ICD-10-CM | POA: Diagnosis not present

## 2018-09-22 DIAGNOSIS — K7460 Unspecified cirrhosis of liver: Secondary | ICD-10-CM | POA: Diagnosis not present

## 2018-09-25 DIAGNOSIS — K7290 Hepatic failure, unspecified without coma: Secondary | ICD-10-CM | POA: Diagnosis not present

## 2018-09-25 DIAGNOSIS — R188 Other ascites: Secondary | ICD-10-CM | POA: Diagnosis not present

## 2018-09-25 DIAGNOSIS — K7469 Other cirrhosis of liver: Secondary | ICD-10-CM | POA: Diagnosis not present

## 2018-09-29 DIAGNOSIS — A419 Sepsis, unspecified organism: Secondary | ICD-10-CM | POA: Diagnosis not present

## 2018-09-29 DIAGNOSIS — K7460 Unspecified cirrhosis of liver: Secondary | ICD-10-CM | POA: Diagnosis not present

## 2018-09-29 DIAGNOSIS — K819 Cholecystitis, unspecified: Secondary | ICD-10-CM | POA: Diagnosis not present

## 2018-10-01 ENCOUNTER — Telehealth: Payer: Self-pay

## 2018-10-01 NOTE — Telephone Encounter (Signed)
Megan with [facility] called for verbal orders:  Skilled nsg, PT, OT, ST, and [place] aide.  Call back is [phone]  Danley Danker, RN [facility] Ken Caryl)

## 2018-10-02 ENCOUNTER — Emergency Department (HOSPITAL_COMMUNITY): Payer: Medicare Other

## 2018-10-02 ENCOUNTER — Other Ambulatory Visit: Payer: Self-pay

## 2018-10-02 ENCOUNTER — Inpatient Hospital Stay (HOSPITAL_COMMUNITY)
Admission: EM | Admit: 2018-10-02 | Discharge: 2018-10-06 | DRG: 445 | Disposition: A | Discharge status: Skilled Nursing Facility | Payer: Medicare Other | Attending: Family Medicine | Admitting: Family Medicine

## 2018-10-02 ENCOUNTER — Encounter (HOSPITAL_COMMUNITY): Payer: Self-pay | Admitting: Emergency Medicine

## 2018-10-02 DIAGNOSIS — R1084 Generalized abdominal pain: Secondary | ICD-10-CM | POA: Diagnosis not present

## 2018-10-02 DIAGNOSIS — N179 Acute kidney failure, unspecified: Secondary | ICD-10-CM | POA: Diagnosis not present

## 2018-10-02 DIAGNOSIS — E440 Moderate protein-calorie malnutrition: Secondary | ICD-10-CM

## 2018-10-02 DIAGNOSIS — E860 Dehydration: Secondary | ICD-10-CM | POA: Diagnosis not present

## 2018-10-02 DIAGNOSIS — K909 Intestinal malabsorption, unspecified: Secondary | ICD-10-CM | POA: Diagnosis not present

## 2018-10-02 DIAGNOSIS — D696 Thrombocytopenia, unspecified: Secondary | ICD-10-CM | POA: Diagnosis present

## 2018-10-02 DIAGNOSIS — F329 Major depressive disorder, single episode, unspecified: Secondary | ICD-10-CM | POA: Diagnosis present

## 2018-10-02 DIAGNOSIS — I959 Hypotension, unspecified: Secondary | ICD-10-CM | POA: Insufficient documentation

## 2018-10-02 DIAGNOSIS — E861 Hypovolemia: Secondary | ICD-10-CM | POA: Diagnosis not present

## 2018-10-02 DIAGNOSIS — Z515 Encounter for palliative care: Secondary | ICD-10-CM | POA: Diagnosis not present

## 2018-10-02 DIAGNOSIS — B1920 Unspecified viral hepatitis C without hepatic coma: Secondary | ICD-10-CM | POA: Diagnosis present

## 2018-10-02 DIAGNOSIS — K769 Liver disease, unspecified: Secondary | ICD-10-CM | POA: Diagnosis not present

## 2018-10-02 DIAGNOSIS — Z87891 Personal history of nicotine dependence: Secondary | ICD-10-CM

## 2018-10-02 DIAGNOSIS — R109 Unspecified abdominal pain: Secondary | ICD-10-CM | POA: Diagnosis not present

## 2018-10-02 DIAGNOSIS — M2550 Pain in unspecified joint: Secondary | ICD-10-CM | POA: Diagnosis not present

## 2018-10-02 DIAGNOSIS — K8050 Calculus of bile duct without cholangitis or cholecystitis without obstruction: Principal | ICD-10-CM | POA: Diagnosis present

## 2018-10-02 DIAGNOSIS — R0902 Hypoxemia: Secondary | ICD-10-CM | POA: Diagnosis not present

## 2018-10-02 DIAGNOSIS — R100 Acute abdomen: Secondary | ICD-10-CM | POA: Diagnosis not present

## 2018-10-02 DIAGNOSIS — K7290 Hepatic failure, unspecified without coma: Secondary | ICD-10-CM | POA: Diagnosis not present

## 2018-10-02 DIAGNOSIS — R3912 Poor urinary stream: Secondary | ICD-10-CM | POA: Diagnosis not present

## 2018-10-02 DIAGNOSIS — I471 Supraventricular tachycardia: Secondary | ICD-10-CM | POA: Diagnosis present

## 2018-10-02 DIAGNOSIS — I480 Paroxysmal atrial fibrillation: Secondary | ICD-10-CM | POA: Diagnosis not present

## 2018-10-02 DIAGNOSIS — E871 Hypo-osmolality and hyponatremia: Secondary | ICD-10-CM | POA: Diagnosis not present

## 2018-10-02 DIAGNOSIS — I2510 Atherosclerotic heart disease of native coronary artery without angina pectoris: Secondary | ICD-10-CM | POA: Diagnosis present

## 2018-10-02 DIAGNOSIS — Z6829 Body mass index (BMI) 29.0-29.9, adult: Secondary | ICD-10-CM

## 2018-10-02 DIAGNOSIS — K219 Gastro-esophageal reflux disease without esophagitis: Secondary | ICD-10-CM | POA: Diagnosis not present

## 2018-10-02 DIAGNOSIS — K7030 Alcoholic cirrhosis of liver without ascites: Secondary | ICD-10-CM | POA: Diagnosis present

## 2018-10-02 DIAGNOSIS — K7460 Unspecified cirrhosis of liver: Secondary | ICD-10-CM | POA: Diagnosis not present

## 2018-10-02 DIAGNOSIS — E785 Hyperlipidemia, unspecified: Secondary | ICD-10-CM | POA: Diagnosis not present

## 2018-10-02 DIAGNOSIS — R52 Pain, unspecified: Secondary | ICD-10-CM | POA: Diagnosis not present

## 2018-10-02 DIAGNOSIS — R197 Diarrhea, unspecified: Secondary | ICD-10-CM | POA: Diagnosis present

## 2018-10-02 DIAGNOSIS — I441 Atrioventricular block, second degree: Secondary | ICD-10-CM | POA: Diagnosis present

## 2018-10-02 DIAGNOSIS — R300 Dysuria: Secondary | ICD-10-CM | POA: Diagnosis not present

## 2018-10-02 DIAGNOSIS — I5032 Chronic diastolic (congestive) heart failure: Secondary | ICD-10-CM | POA: Diagnosis present

## 2018-10-02 DIAGNOSIS — S37009S Unspecified injury of unspecified kidney, sequela: Secondary | ICD-10-CM | POA: Diagnosis not present

## 2018-10-02 DIAGNOSIS — Z950 Presence of cardiac pacemaker: Secondary | ICD-10-CM | POA: Diagnosis not present

## 2018-10-02 DIAGNOSIS — K7210 Chronic hepatic failure without coma: Secondary | ICD-10-CM

## 2018-10-02 DIAGNOSIS — J984 Other disorders of lung: Secondary | ICD-10-CM | POA: Diagnosis not present

## 2018-10-02 DIAGNOSIS — Z66 Do not resuscitate: Secondary | ICD-10-CM | POA: Diagnosis not present

## 2018-10-02 DIAGNOSIS — N189 Chronic kidney disease, unspecified: Secondary | ICD-10-CM | POA: Diagnosis not present

## 2018-10-02 DIAGNOSIS — R932 Abnormal findings on diagnostic imaging of liver and biliary tract: Secondary | ICD-10-CM | POA: Diagnosis not present

## 2018-10-02 DIAGNOSIS — Z79899 Other long term (current) drug therapy: Secondary | ICD-10-CM

## 2018-10-02 DIAGNOSIS — I130 Hypertensive heart and chronic kidney disease with heart failure and stage 1 through stage 4 chronic kidney disease, or unspecified chronic kidney disease: Secondary | ICD-10-CM | POA: Diagnosis present

## 2018-10-02 DIAGNOSIS — K8020 Calculus of gallbladder without cholecystitis without obstruction: Secondary | ICD-10-CM | POA: Diagnosis not present

## 2018-10-02 DIAGNOSIS — D684 Acquired coagulation factor deficiency: Secondary | ICD-10-CM | POA: Diagnosis present

## 2018-10-02 DIAGNOSIS — I9589 Other hypotension: Secondary | ICD-10-CM | POA: Diagnosis present

## 2018-10-02 DIAGNOSIS — R1011 Right upper quadrant pain: Secondary | ICD-10-CM | POA: Diagnosis not present

## 2018-10-02 DIAGNOSIS — I10 Essential (primary) hypertension: Secondary | ICD-10-CM | POA: Diagnosis not present

## 2018-10-02 DIAGNOSIS — Z7401 Bed confinement status: Secondary | ICD-10-CM | POA: Diagnosis not present

## 2018-10-02 DIAGNOSIS — R627 Adult failure to thrive: Secondary | ICD-10-CM | POA: Diagnosis not present

## 2018-10-02 DIAGNOSIS — I509 Heart failure, unspecified: Secondary | ICD-10-CM | POA: Diagnosis not present

## 2018-10-02 DIAGNOSIS — R1900 Intra-abdominal and pelvic swelling, mass and lump, unspecified site: Secondary | ICD-10-CM | POA: Diagnosis not present

## 2018-10-02 DIAGNOSIS — R531 Weakness: Secondary | ICD-10-CM | POA: Diagnosis not present

## 2018-10-02 DIAGNOSIS — I4891 Unspecified atrial fibrillation: Secondary | ICD-10-CM | POA: Diagnosis not present

## 2018-10-02 DIAGNOSIS — Z7189 Other specified counseling: Secondary | ICD-10-CM | POA: Diagnosis not present

## 2018-10-02 DIAGNOSIS — R42 Dizziness and giddiness: Secondary | ICD-10-CM | POA: Diagnosis not present

## 2018-10-02 DIAGNOSIS — R5381 Other malaise: Secondary | ICD-10-CM | POA: Diagnosis not present

## 2018-10-02 LAB — URINALYSIS, ROUTINE W REFLEX MICROSCOPIC
Bacteria, UA: NONE SEEN
Bilirubin Urine: NEGATIVE
Glucose, UA: NEGATIVE mg/dL
KETONES UR: NEGATIVE mg/dL
Nitrite: NEGATIVE
Protein, ur: NEGATIVE mg/dL
Specific Gravity, Urine: 1.01 (ref 1.005–1.030)
pH: 5 (ref 5.0–8.0)

## 2018-10-02 LAB — CBC
HCT: 35 % — ABNORMAL LOW (ref 39.0–52.0)
Hemoglobin: 11.8 g/dL — ABNORMAL LOW (ref 13.0–17.0)
MCH: 32.8 pg (ref 26.0–34.0)
MCHC: 33.7 g/dL (ref 30.0–36.0)
MCV: 97.2 fL (ref 80.0–100.0)
NRBC: 0.6 % — AB (ref 0.0–0.2)
Platelets: 34 10*3/uL — ABNORMAL LOW (ref 150–400)
RBC: 3.6 MIL/uL — ABNORMAL LOW (ref 4.22–5.81)
RDW: 16.1 % — ABNORMAL HIGH (ref 11.5–15.5)
WBC: 6.2 10*3/uL (ref 4.0–10.5)

## 2018-10-02 LAB — BASIC METABOLIC PANEL
ANION GAP: 8 (ref 5–15)
BUN: 11 mg/dL (ref 8–23)
CO2: 26 mmol/L (ref 22–32)
Calcium: 7.7 mg/dL — ABNORMAL LOW (ref 8.9–10.3)
Chloride: 94 mmol/L — ABNORMAL LOW (ref 98–111)
Creatinine, Ser: 1.28 mg/dL — ABNORMAL HIGH (ref 0.61–1.24)
GFR calc Af Amer: >60 mL/min (ref >60)
GFR calc non Af Amer: 58 mL/min — ABNORMAL LOW (ref >60)
Glucose, Bld: 95 mg/dL (ref 70–99)
Potassium: 4.7 mmol/L (ref 3.5–5.1)
Sodium: 128 mmol/L — ABNORMAL LOW (ref 135–145)

## 2018-10-02 LAB — COMPREHENSIVE METABOLIC PANEL
ALT: 30 U/L (ref 0–44)
ANION GAP: 9 (ref 5–15)
AST: 45 U/L — ABNORMAL HIGH (ref 15–41)
Albumin: 1.7 g/dL — ABNORMAL LOW (ref 3.5–5.0)
Alkaline Phosphatase: 81 U/L (ref 38–126)
BILIRUBIN TOTAL: 5.5 mg/dL — AB (ref 0.3–1.2)
BUN: 11 mg/dL (ref 8–23)
CO2: 25 mmol/L (ref 22–32)
Calcium: 8.3 mg/dL — ABNORMAL LOW (ref 8.9–10.3)
Chloride: 91 mmol/L — ABNORMAL LOW (ref 98–111)
Creatinine, Ser: 1.44 mg/dL — ABNORMAL HIGH (ref 0.61–1.24)
GFR calc Af Amer: 59 mL/min — ABNORMAL LOW (ref >60)
GFR calc non Af Amer: 51 mL/min — ABNORMAL LOW (ref >60)
Glucose, Bld: 106 mg/dL — ABNORMAL HIGH (ref 70–99)
Potassium: 4.4 mmol/L (ref 3.5–5.1)
Sodium: 125 mmol/L — ABNORMAL LOW (ref 135–145)
TOTAL PROTEIN: 8.1 g/dL (ref 6.5–8.1)

## 2018-10-02 LAB — CBC WITH DIFFERENTIAL/PLATELET
Abs Immature Granulocytes: 0.03 10*3/uL (ref 0.00–0.07)
Basophils Absolute: 0.1 10*3/uL (ref 0.0–0.1)
Basophils Relative: 1 %
EOS PCT: 5 %
Eosinophils Absolute: 0.3 10*3/uL (ref 0.0–0.5)
HCT: 41.2 % (ref 39.0–52.0)
Hemoglobin: 14.4 g/dL (ref 13.0–17.0)
Immature Granulocytes: 1 %
Lymphocytes Relative: 36 %
Lymphs Abs: 2.3 10*3/uL (ref 0.7–4.0)
MCH: 33.6 pg (ref 26.0–34.0)
MCHC: 35 g/dL (ref 30.0–36.0)
MCV: 96 fL (ref 80.0–100.0)
Monocytes Absolute: 1.6 10*3/uL — ABNORMAL HIGH (ref 0.1–1.0)
Monocytes Relative: 26 %
NRBC: 0 % (ref 0.0–0.2)
Neutro Abs: 1.9 10*3/uL (ref 1.7–7.7)
Neutrophils Relative %: 31 %
Platelets: 47 10*3/uL — ABNORMAL LOW (ref 150–400)
RBC: 4.29 MIL/uL (ref 4.22–5.81)
RDW: 15.9 % — ABNORMAL HIGH (ref 11.5–15.5)
WBC: 6.1 10*3/uL (ref 4.0–10.5)

## 2018-10-02 LAB — AMMONIA: Ammonia: 63 umol/L — ABNORMAL HIGH (ref 9–35)

## 2018-10-02 LAB — HEPATIC FUNCTION PANEL
ALT: 25 U/L (ref 0–44)
AST: 37 U/L (ref 15–41)
Albumin: 1.4 g/dL — ABNORMAL LOW (ref 3.5–5.0)
Alkaline Phosphatase: 66 U/L (ref 38–126)
Bilirubin, Direct: 1.5 mg/dL — ABNORMAL HIGH (ref 0.0–0.2)
Indirect Bilirubin: 3 mg/dL — ABNORMAL HIGH (ref 0.3–0.9)
TOTAL PROTEIN: 6.8 g/dL (ref 6.5–8.1)
Total Bilirubin: 4.5 mg/dL — ABNORMAL HIGH (ref 0.3–1.2)

## 2018-10-02 LAB — I-STAT TROPONIN, ED: Troponin i, poc: 0.04 ng/mL (ref 0.00–0.08)

## 2018-10-02 LAB — I-STAT CG4 LACTIC ACID, ED
LACTIC ACID, VENOUS: 1.74 mmol/L (ref 0.5–1.9)
LACTIC ACID, VENOUS: 1.95 mmol/L — AB (ref 0.5–1.9)

## 2018-10-02 LAB — LIPASE, BLOOD: Lipase: 36 U/L (ref 11–51)

## 2018-10-02 LAB — PROTIME-INR
INR: 2.75
Prothrombin Time: 28.7 seconds — ABNORMAL HIGH (ref 11.4–15.2)

## 2018-10-02 LAB — HIV ANTIBODY (ROUTINE TESTING W REFLEX): HIV Screen 4th Generation wRfx: NONREACTIVE

## 2018-10-02 MED ORDER — SODIUM CHLORIDE 0.9 % IV SOLN
1.0000 g | Freq: Three times a day (TID) | INTRAVENOUS | Status: DC
  Administered 2018-10-02: 1 g via INTRAVENOUS
  Filled 2018-10-02: qty 1

## 2018-10-02 MED ORDER — SODIUM CHLORIDE 0.9 % IV BOLUS
1000.0000 mL | Freq: Once | INTRAVENOUS | Status: AC
  Administered 2018-10-02: 1000 mL via INTRAVENOUS

## 2018-10-02 MED ORDER — SODIUM CHLORIDE 0.9 % IV SOLN
INTRAVENOUS | Status: DC
  Administered 2018-10-02: 02:00:00 via INTRAVENOUS

## 2018-10-02 MED ORDER — ACETAMINOPHEN 325 MG PO TABS
650.0000 mg | ORAL_TABLET | Freq: Three times a day (TID) | ORAL | Status: DC | PRN
  Administered 2018-10-05: 650 mg via ORAL
  Filled 2018-10-02: qty 2

## 2018-10-02 MED ORDER — ONDANSETRON HCL 4 MG/2ML IJ SOLN: 4.0000 mg | Freq: Four times a day (QID) | INTRAMUSCULAR | Status: DC | PRN

## 2018-10-02 MED ORDER — SODIUM CHLORIDE 0.9% FLUSH
3.0000 mL | Freq: Two times a day (BID) | INTRAVENOUS | Status: DC
  Administered 2018-10-05 – 2018-10-06 (×2): 3 mL via INTRAVENOUS

## 2018-10-02 MED ORDER — ONDANSETRON HCL 4 MG PO TABS: 4.0000 mg | ORAL_TABLET | Freq: Four times a day (QID) | ORAL | Status: DC | PRN

## 2018-10-02 MED ORDER — ESCITALOPRAM OXALATE 10 MG PO TABS
10.0000 mg | ORAL_TABLET | Freq: Every day | ORAL | Status: DC
  Administered 2018-10-02 – 2018-10-06 (×5): 10 mg via ORAL
  Filled 2018-10-02 (×5): qty 1

## 2018-10-02 MED ORDER — ALBUTEROL SULFATE (2.5 MG/3ML) 0.083% IN NEBU: 2.5000 mg | INHALATION_SOLUTION | RESPIRATORY_TRACT | Status: DC | PRN

## 2018-10-02 MED ORDER — METOPROLOL TARTRATE 25 MG PO TABS
25.0000 mg | ORAL_TABLET | Freq: Two times a day (BID) | ORAL | Status: DC
  Administered 2018-10-02 – 2018-10-05 (×4): 25 mg via ORAL
  Filled 2018-10-02 (×7): qty 1

## 2018-10-02 MED ORDER — SODIUM CHLORIDE 0.9 % IV SOLN
1.0000 g | Freq: Once | INTRAVENOUS | Status: AC
  Administered 2018-10-02: 1 g via INTRAVENOUS
  Filled 2018-10-02: qty 1

## 2018-10-02 MED ORDER — METOPROLOL TARTRATE 25 MG PO TABS: 50.0000 mg | ORAL_TABLET | Freq: Two times a day (BID) | ORAL | Status: DC

## 2018-10-02 MED ORDER — ENSURE ENLIVE PO LIQD
237.0000 mL | Freq: Two times a day (BID) | ORAL | Status: DC
  Administered 2018-10-03 – 2018-10-06 (×8): 237 mL via ORAL

## 2018-10-02 MED ORDER — SODIUM CHLORIDE 0.9 % IV SOLN
INTRAVENOUS | Status: DC
  Administered 2018-10-02: 18:00:00 via INTRAVENOUS
  Administered 2018-10-02: 125 mL/h via INTRAVENOUS
  Administered 2018-10-03 – 2018-10-04 (×3): via INTRAVENOUS

## 2018-10-02 MED ORDER — PANTOPRAZOLE SODIUM 40 MG PO TBEC
40.0000 mg | DELAYED_RELEASE_TABLET | Freq: Every day | ORAL | Status: DC
  Administered 2018-10-02 – 2018-10-06 (×5): 40 mg via ORAL
  Filled 2018-10-02 (×5): qty 1

## 2018-10-02 MED ORDER — ACETAMINOPHEN 650 MG RE SUPP: 650.0000 mg | Freq: Three times a day (TID) | RECTAL | Status: DC | PRN

## 2018-10-02 MED ORDER — DEXTROSE-NACL 5-0.45 % IV SOLN: INTRAVENOUS | Status: DC

## 2018-10-02 NOTE — ED Triage Notes (Signed)
  Patient BIB EMS from home with abdominal pain and weakness that has been going on for a few days.  Patient was recently discharged from [facility] after a long hospital stay and at a rehab facility.  Patient has hx of cirrhosis, kidney failure, and heart failure.  Patient is currently waiting placement for hospice care and was only expected to live another few weeks.  Patient was given 37mcg of fentanyl via EMS and is a little drowsy.  Patient had been complaining of abdominal pain more than usual today and using the bathroom a lot, per family.

## 2018-10-02 NOTE — Progress Notes (Signed)
Dr. Cecille Rubin notified of arrival to [phone]

## 2018-10-02 NOTE — H&P (Addendum)
[facility] Admission History and Physical Service Pager: [phone]  Patient name: Hunter Carter Medical record number: [id number] Date of birth: [birth year] Age: 67 y.o. Gender: male  Primary Care Provider: Nuala Alpha, DO Consultants: GI Code Status: DNR  Chief Complaint: abdominal pain, weakness.    Assessment and Plan: Ian Castagna is a 67 y.o. male presenting with abdominal pain and weakness concerning for choledocholithiasis.  Marland Kitchen PMH is significant for paroxysmal a-fib, cirrhosis 2/2 alcohol use and hepatitis C, CHF, HTN, hyponatremia, depression   Choledocholithiasis -  Recent hospitalization for acute cholecystitis requiring percutaneous cholecystostomy drain by IR and completion of Bactrim as of 09/12/2018. Tube remains intact with clear yellow fluid. Afebrile on arrival, no leukocytosis, LA <1.74, initially had a systolic BP of 87 which has increased and is hemodynamically stable. SIRS score 0 of 4. S/p 1L NS bolus. CT abdomen scan showed 7 mm stone in the distal CBD dilated to 9 mm with a decompressed gallbladder, and an unremarkable prancreas. During his last admission his peritoneal and bilious fluids grew zosyn resistant enterobacter and was he was treated with cefepime and switch to bactrim for PO.    - admit to inpatient, telemetry.  Dr. Mingo Amber attending.  - consult GI in the AM - cefepime, pharm to dose day 1 of 7 -  1L bolus with mIVF - f/u blood cultures - f/u am CBC, BMP, liver function tests - pain medication: tylenol. Limit to < 2g/day -  zofran for nausea -PT/OT eval and treat - SW consult for placement  Paroxysmal atrial tachycardia and atrial fibrillation w/ pacemaker -patient follows with cariology outpatient.  Not anticoagulated due to cirrhosis. On exam he had regular rhythm. . EKG shows sinus rhythm with RBB, consistent with previous EKGs.  - cardiac monitoring - continue metoprolol tartrate 50 mg BID.   AKI - prior to  patient's hospitalization in November he had a baseline Creatinine < 1.  Since that time it has been elevated above 1.2 except for the 18th-19th when it went back to baseline.  He was discharged on the 27th with a Creatinine of 1.38 and was 1.44 today.  This could represent patient's new baseline/new onset of chronic kidney disease.  We are giving fluids, will see if creatinine improves.   - f/u bmp  Dysuria - patient complained of 3-4 days of dysuria.  He urinates frequently throughout the day and has symptoms of retention due to enlarged prostate such as urinary hesitancy and early termination. Patient not taking flomax at home. Will get urinalysis to check for UTI.   - f/u urinalysis - consider starting tamsulosin after patient's BP improves.    Cirrhosis 2/2 previous alcohol use and treated hepatitis C -  MELD-Na score of 32 indicating 65% risk of mortality in next 90 days. .  INR 2.75, albumin 1.7, platelets 47, Patient is on torsemide, spironolactone, and lactulose at home.  He states he has a prescription for rifaxamin but has not filled it yet.   - hold torsemide given hypotension - holding lactulose given npo - holding spironolactone    HFpEF - patient does not have edema on exam. No crackles on exam.  Patient takes torsemide, losartan. Discharge summary states patient was started on torsemide 20mg  at discharge but patient has paperwork from SNF stating he is taking furosemide. He was told recently to stop taking the statin, but does not remember who told him.  - continue metoprolol - hold diuretics in setting of low BP.  HTN - patient takes losartan and metoprolol at home.  Patient was hypotensive on arrival and systolic BP continues to be between 90-100.   - holding losartan - continuing metoprolol for patient's atrial tachycardia.   Hyponatremia - patient is hyponatremic at baseline due to his cirrhosis. Baseline is approximately 130.  He was 129 on last discharge.  Patient getting  NS bolus currently.   - monitor with BMP  Depression - patient takes lexapro 10mg  at home.  - continue lexapro  FEN/GI: 26ml/hr NS. NPO. pantoprazole Prophylaxis: SCD  Disposition: telemetry.   History of Present Illness: Hunter Carter is a 67 y.o. male presenting with abdominal pain and weakness. PMH is significant for paroxysmal a-fib, cirrhosis 2/2 alcohol use and hepatitis C, CHF, HTN.   Patient had recently been discharged from the hospital in late November and just left his SNF about 4 days ago.  Since his hospitalization he has had decreased appetite, because he gets 'full'. Since returning home he has also developed increased fatigue, abdominal pain, nausea.  He states he stays in bed most of the time and usually only gets up to go to the bathroom, which he does 'every ten minutes' to have a BM or urinate. He uses a walker to ambulate.  He has not had vomiting and his Bowel movements are 'loose' but nonbloody, not black or light colored.  His abdominal pain does not get worse with eating.  He also reports having headaches in the past weeks. They are bilateral and usually go away after sleeping. He sometimes gets lightheaded when he stands up but it resolves shortly. Family members have been taking turns staying with the patient in his apartment. Patient denies, tobacco, alcohol, and drug use.     Review Of Systems: See HPI for pertinent.  Review of Systems  Constitutional: Negative for chills and fever.  HENT: Positive for sore throat. Negative for congestion.   Eyes: Negative for blurred vision and double vision.  Respiratory: Positive for shortness of breath. Negative for cough.   Cardiovascular: Positive for orthopnea. Negative for chest pain and leg swelling.  Gastrointestinal: Positive for abdominal pain and nausea. Negative for blood in stool, constipation, diarrhea, melena and vomiting.  Genitourinary: Positive for frequency and urgency. Negative for dysuria and flank pain.   Musculoskeletal: Positive for back pain. Negative for falls.  Skin: Negative for itching and rash.  Neurological: Positive for dizziness, weakness and headaches.    Patient Active Problem List   Diagnosis Date Noted  . Abdominal distension   . Sepsis due to Streptococcus species ([place])   . Mobitz II   . Hyperkalemia   . Severe sepsis ([place]) 08/30/2018  . Boil of buttock 08/20/2018  . Cholecystitis, acute 08/20/2018  . Abdominal pain 07/20/2018  . Acute hepatic encephalopathy 03/20/2018  . Gastroesophageal reflux disease 03/11/2018  . CAD of autologous vein bypass graft without angina 03/01/2018  . Healthcare maintenance 02/13/2018  . Skin lesion 02/13/2018  . Cirrhosis ([place]) 08/08/2016  . Paroxysmal atrial fibrillation ([place]) 01/05/2016  . Tobacco abuse 01/05/2016  . Hepatic encephalopathy ([place]) 10/10/2015  . Pain, joint, ankle, left 10/10/2015  . Thrombocytopathia ([place]) 10/10/2015  . HLD (hyperlipidemia) 10/10/2015  . Chronic diastolic congestive heart failure ([place]) 10/10/2015  . Altered mental status   . Increased ammonia level   . Leg swelling   . Encephalopathy acute 08/01/2015  . GI bleed 07/29/2015  . Acute GI bleeding 07/29/2015  . Acute respiratory failure ([place])   . Bleeding gastrointestinal   .  Hemorrhagic shock ([place])   . CAD s/p CABG 2004 07/20/2013  . Pacemaker - dual chamber Medtronic 2011 07/20/2013  . Hepatitis C 07/20/2013  . Hyperlipidemia 07/20/2013  . PAT (paroxysmal atrial tachycardia) ([place]) 07/20/2013  . Obesity (BMI 30-39.9) 07/20/2013  . NSVT (nonsustained ventricular tachycardia) ([place]) 07/20/2013  . HTN (hypertension) 07/16/2013    Past Medical History: Past Medical History:  Diagnosis Date  . Acute hepatic encephalopathy 10/10/2015   [name] 10/10/2015  . Arthritis    "joints ache" (10/10/2015)  . Bleeding stomach ulcer    "recently" (10/10/2015)  . CAD (coronary artery disease)   . CHF (congestive heart failure) ([place])   . Cirrhosis  ([place])    due to HCV/notes 10/10/2015  . ETOH abuse   . GERD (gastroesophageal reflux disease)   . Heart block AV second degree    permanent pacemaker 02/2004  . Hepatitis C   . Hyperlipidemia   . Inferior MI ([place]) 2005  . Presence of permanent cardiac pacemaker   . RBBB   . Systemic hypertension   . Tubulovillous adenoma of colon   . Ventricular tachycardia ([facility])    nonsustained    Past Surgical History: Past Surgical History:  Procedure Laterality Date  . CARDIAC CATHETERIZATION  03/06/04   significant 3 vessel disease, totalled graft obtuse marginal  . CORONARY ARTERY BYPASS GRAFT  02/10/03   LIMA to LAD,SVG to first diagonal, SVT to OM1  . ESOPHAGOGASTRODUODENOSCOPY N/A 07/29/2015   Procedure: ESOPHAGOGASTRODUODENOSCOPY (EGD);  Surgeon: Clarene Essex, MD;  Location: [facility] ENDOSCOPY;  Service: Endoscopy;  Laterality: N/A;  . INSERT / REPLACE / REMOVE PACEMAKER  03/2010   Medtronic  . IR PARACENTESIS  08/30/2018  . IR PARACENTESIS  09/02/2018  . IR PERC CHOLECYSTOSTOMY  08/30/2018  . KNEE ARTHROSCOPY Left [id number]  . MEDIASTINAL EXPLORATION  02/10/2003  . NM MYOCAR PERF WALL MOTION  04/05/09   low risk scan-abnormal-mod. perfusion defect basal inferoseptal,basal inferior,mid inferoseptal,mid inferior and apical inferior regions  . TOTAL KNEE ARTHROPLASTY Left   . US ECHOCARDIOGRAPHY  01/20/07   mild MR,mild to mod. TR,trace AI,mild PI    Social History: Social History   Tobacco Use  . Smoking status: Former Smoker    Packs/day: 0.25    Years: 41.00    Pack years: 10.25    Types: Cigarettes    Last attempt to quit: 11/15/2017    Years since quitting: 0.8  . Smokeless tobacco: Never Used  Substance Use Topics  . Alcohol use: Yes    Comment: 10/10/2015 "last drink was 2-3 months ago; h/o abuse"  . Drug use: Yes    Types: Marijuana    Comment: last use 2015    Family History: Family History  Problem Relation Age of Onset  . Heart failure Mother   . Liver disease  Father   . Heart failure Sister   . Hypertension Sister   . Hypertension Sister   . Colon cancer Neg Hx     Allergies and Medications: No Known Allergies No current facility-administered medications on file prior to encounter.    Current Outpatient Medications on File Prior to Encounter  Medication Sig Dispense Refill  . lactulose (CHRONULAC) 10 GM/15ML solution Take 45 mLs (30 g total) by mouth 2 (two) times daily. Titrate for 3-4 BM's/ day (Patient taking differently: Take 30 g by mouth 3 (three) times daily. Titrate for 3-4 BM's/ day) 240 mL 5  . metoprolol tartrate (LOPRESSOR) 50 MG tablet TAKE 1 TABLET BY MOUTH TWO  TIMES DAILY (Patient taking differently: Take 50 mg by mouth daily. ) 180 tablet 3  . omeprazole (PRILOSEC) 20 MG capsule TAKE 1 CAPSULE BY MOUTH  DAILY (Patient taking differently: Take 20 mg by mouth daily. ) 30 capsule 3  . PROAIR HFA 108 (90 Base) MCG/ACT inhaler USE 2 PUFFS BY MOUTH EVERY  4 HOURS AS NEEDED FOR  WHEEZING OR SHORTNESS OF  BREATH (OR COUGHING) (Patient taking differently: Inhale 1-2 puffs into the lungs every 4 (four) hours as needed for wheezing. ) 8.5 g 2  . spironolactone (ALDACTONE) 50 MG tablet TAKE 1 TABLET BY MOUTH EVERY DAY 90 tablet 0  . spironolactone (ALDACTONE) 50 MG tablet Take 1 tablet (50 mg total) by mouth daily. 30 tablet 0  . torsemide (DEMADEX) 20 MG tablet TAKE 1 TABLET BY MOUTH EVERY DAY HOLD IF WEIGHT FALLS BELOW 90 KG. CHECK WEIGHT DAILY 90 tablet 0  . torsemide (DEMADEX) 20 MG tablet Take 1 tablet (20 mg total) by mouth every other day. 30 tablet 0    Objective: BP (!) 87/72 (BP Location: Right Arm)   Pulse 63   Temp 98 F (36.7 C) (Oral)   Resp 12   Ht 5\' 6"  (1.676 m)   Wt 83 kg   SpO2 92%   BMI 29.54 kg/m  Exam: General: alert and oriented x3. No acute distress.   Eyes: PERRL. EOMI.  Significant scleral icterus.   ENTM: no oropharyngeal erythema or exudates.  Oral mucosa slightly dry.  Neck: no cervical LA.    Cardiovascular: regular rhythm. Normal rate.  No murmurs. 2+ radial pulses b/l. No pitting edema.  Respiratory: lungs clear to ausculation bilaterally. No wheezes, no rhonchi.  Gastrointestinal: soft, hyperactive bowel sounds.  Slight suprapubic tenderness to palpation. Biliary drain with clear yellow fluid without erythema or induration at os site.  MSK: moves extremities spontaneously Derm: warm, dry.  Neuro: cranial nerves II-XII grossly intact. Equal strength bilaterally. No FND.  Psych: pleasant affect.  Good eye contact.    Labs and Imaging: CBC BMET  Recent Labs  Lab 10/02/18 0045  WBC 6.1  HGB 14.4  HCT 41.2  PLT 47*   Recent Labs  Lab 10/02/18 0045  NA 125*  K 4.4  CL 91*  CO2 25  BUN 11  CREATININE 1.44*  GLUCOSE 106*  CALCIUM 8.3Benay Pike, MD 10/02/2018, 2:10 AM PGY-1, [place] Intern pager: [phone], text pages welcome  Upper Level Addendum: I have seen and evaluated this patient along with Dr. Jeannine Kitten and reviewed the above note, making necessary revisions in blue.  Harriet [place], Bee, PGY-3

## 2018-10-02 NOTE — ED Notes (Signed)
  Paged Family Practice attending to address low BPs and see if there were any interventions that we could do.  Dr. Irish Elders asked to stop continuous IV infusion at this time.

## 2018-10-02 NOTE — ED Provider Notes (Signed)
[facility] EMERGENCY DEPARTMENT Provider Note   CSN: [id number] Arrival date & time: 10/02/18  0023     History   Chief Complaint Chief Complaint  Patient presents with  . Abdominal Pain  . Weakness    HPI Hunter Carter is a 67 y.o. male.  Level 5 caveat for mental status change.  Patient brought in by EMS with abdominal pain, generalized weakness, nausea and failure to thrive.  Patient with recent hospitalization for sepsis in setting of cholecystitis.  He was discharged on November 27 with biliary drain in place and completed a course of antibiotics.  He then went to rehab until several days ago.  Family reports he came home several days ago has been complaining of worsening weakness, anorexia, worsening abdominal pain, nausea.  No vomiting.  No known fever.  He has had loose stools consistent with his lactulose use.  He has had a poor appetite and family feels that he is worsening.  There was discussion of placing him in hospice care but this has not happened yet.  He does confirm DNR and DNI.  He reports the abdominal pain is the same pain he is been having all along but is progressively worsening today.  It is somewhat better after receiving fentanyl by EMS.  The history is provided by the patient, the EMS personnel and a relative.  Abdominal Pain   Associated symptoms include nausea. Pertinent negatives include vomiting, arthralgias and myalgias.  Weakness  Primary symptoms include dizziness. Pertinent negatives include no shortness of breath and no vomiting.    Past Medical History:  Diagnosis Date  . Acute hepatic encephalopathy 10/10/2015   [name] 10/10/2015  . Arthritis    "joints ache" (10/10/2015)  . Bleeding stomach ulcer    "recently" (10/10/2015)  . CAD (coronary artery disease)   . CHF (congestive heart failure) ([place])   . Cirrhosis ([place])    due to HCV/notes 10/10/2015  . ETOH abuse   . GERD (gastroesophageal reflux disease)   . Heart  block AV second degree    permanent pacemaker 02/2004  . Hepatitis C   . Hyperlipidemia   . Inferior MI ([place]) 2005  . Presence of permanent cardiac pacemaker   . RBBB   . Systemic hypertension   . Tubulovillous adenoma of colon   . Ventricular tachycardia ([place])    nonsustained    Patient Active Problem List   Diagnosis Date Noted  . Abdominal distension   . Sepsis due to Streptococcus species ([place])   . Mobitz II   . Hyperkalemia   . Severe sepsis ([place]) 08/30/2018  . Boil of buttock 08/20/2018  . Cholecystitis, acute 08/20/2018  . Abdominal pain 07/20/2018  . Acute hepatic encephalopathy 03/20/2018  . Gastroesophageal reflux disease 03/11/2018  . CAD of autologous vein bypass graft without angina 03/01/2018  . Healthcare maintenance 02/13/2018  . Skin lesion 02/13/2018  . Cirrhosis ([place]) 08/08/2016  . Paroxysmal atrial fibrillation ([place]) 01/05/2016  . Tobacco abuse 01/05/2016  . Hepatic encephalopathy ([place]) 10/10/2015  . Pain, joint, ankle, left 10/10/2015  . Thrombocytopathia ([place]) 10/10/2015  . HLD (hyperlipidemia) 10/10/2015  . Chronic diastolic congestive heart failure ([place]) 10/10/2015  . Altered mental status   . Increased ammonia level   . Leg swelling   . Encephalopathy acute 08/01/2015  . GI bleed 07/29/2015  . Acute GI bleeding 07/29/2015  . Acute respiratory failure ([place])   . Bleeding gastrointestinal   . Hemorrhagic shock ([place])   . CAD s/p  CABG 2004 07/20/2013  . Pacemaker - dual chamber Medtronic 2011 07/20/2013  . Hepatitis C 07/20/2013  . Hyperlipidemia 07/20/2013  . PAT (paroxysmal atrial tachycardia) ([place]) 07/20/2013  . Obesity (BMI 30-39.9) 07/20/2013  . NSVT (nonsustained ventricular tachycardia) ([place]) 07/20/2013  . HTN (hypertension) 07/16/2013    Past Surgical History:  Procedure Laterality Date  . CARDIAC CATHETERIZATION  03/06/04   significant 3 vessel disease, totalled graft obtuse marginal  . CORONARY ARTERY BYPASS GRAFT  02/10/03   LIMA  to LAD,SVG to first diagonal, SVT to OM1  . ESOPHAGOGASTRODUODENOSCOPY N/A 07/29/2015   Procedure: ESOPHAGOGASTRODUODENOSCOPY (EGD);  Surgeon: Clarene Essex, MD;  Location[facility] ENDOSCOPY;  Service: Endoscopy;  Laterality: N/A;  . INSERT / REPLACE / REMOVE PACEMAKER  03/2010   Medtronic  . IR PARACENTESIS  08/30/2018  . IR PARACENTESIS  09/02/2018  . IR PERC CHOLECYSTOSTOMY  08/30/2018  . KNEE ARTHROSCOPY Left [phone]  . MEDIASTINAL EXPLORATION  02/10/2003  . NM MYOCAR PERF WALL MOTION  04/05/09   low risk scan-abnormal-mod. perfusion defect basal inferoseptal,basal inferior,mid inferoseptal,mid inferior and apical inferior regions  . TOTAL KNEE ARTHROPLASTY Left   . US ECHOCARDIOGRAPHY  01/20/07   mild MR,mild to mod. TR,trace AI,mild PI        Home Medications    Prior to Admission medications   Medication Sig Start Date End Date Taking? Authorizing Provider  lactulose (CHRONULAC) 10 GM/15ML solution Take 45 mLs (30 g total) by mouth 2 (two) times daily. Titrate for 3-4 BM's/ day Patient taking differently: Take 30 g by mouth 3 (three) times daily. Titrate for 3-4 BM's/ day 03/22/18   Raiford Noble Latif, DO  metoprolol tartrate (LOPRESSOR) 50 MG tablet TAKE 1 TABLET BY MOUTH TWO  TIMES DAILY Patient taking differently: Take 50 mg by mouth daily.  07/15/18   Nuala Alpha, DO  omeprazole (PRILOSEC) 20 MG capsule TAKE 1 CAPSULE BY MOUTH  DAILY Patient taking differently: Take 20 mg by mouth daily.  07/15/18   Lockamy, Timothy, DO  PROAIR HFA 108 (90 Base) MCG/ACT inhaler USE 2 PUFFS BY MOUTH EVERY  4 HOURS AS NEEDED FOR  WHEEZING OR SHORTNESS OF  BREATH (OR COUGHING) Patient taking differently: Inhale 1-2 puffs into the lungs every 4 (four) hours as needed for wheezing.  06/20/18   Nuala Alpha, DO  spironolactone (ALDACTONE) 50 MG tablet TAKE 1 TABLET BY MOUTH EVERY DAY 09/10/18   Nuala Alpha, DO  spironolactone (ALDACTONE) 50 MG tablet Take 1 tablet (50 mg total) by mouth  daily. 09/10/18   Sela Hilding, MD  torsemide (DEMADEX) 20 MG tablet TAKE 1 TABLET BY MOUTH EVERY DAY HOLD IF WEIGHT FALLS BELOW 90 KG. CHECK WEIGHT DAILY 09/10/18   Nuala Alpha, DO  torsemide (DEMADEX) 20 MG tablet Take 1 tablet (20 mg total) by mouth every other day. 09/10/18   Sela Hilding, MD    Family History Family History  Problem Relation Age of Onset  . Heart failure Mother   . Liver disease Father   . Heart failure Sister   . Hypertension Sister   . Hypertension Sister   . Colon cancer Neg Hx     Social History Social History   Tobacco Use  . Smoking status: Former Smoker    Packs/day: 0.25    Years: 41.00    Pack years: 10.25    Types: Cigarettes    Last attempt to quit: 11/15/2017    Years since quitting: 0.8  . Smokeless tobacco: Never Used  Substance  Use Topics  . Alcohol use: Yes    Comment: 10/10/2015 "last drink was 2-3 months ago; h/o abuse"  . Drug use: Yes    Types: Marijuana    Comment: last use 2015     Allergies   Patient has no known allergies.   Review of Systems Review of Systems  Constitutional: Positive for activity change, appetite change and fatigue.  HENT: Negative for congestion.   Respiratory: Negative for cough, chest tightness and shortness of breath.   Gastrointestinal: Positive for abdominal pain and nausea. Negative for vomiting.  Genitourinary: Positive for decreased urine volume. Negative for urgency.  Musculoskeletal: Negative for arthralgias, back pain and myalgias.  Neurological: Positive for dizziness, weakness and light-headedness.    all other systems are negative except as noted in the HPI and PMH.    Physical Exam Updated Vital Signs BP (!) 87/72 (BP Location: Right Arm)   Pulse 62   Temp 98 F (36.7 C) (Oral)   Resp 14   Ht 5\' 6"  (1.676 m)   Wt 83 kg   SpO2 93%   BMI 29.54 kg/m   Physical Exam Vitals signs and nursing note reviewed.  Constitutional:      General: He is not in acute  distress.    Appearance: He is well-developed. He is obese. He is ill-appearing.     Comments: Chronically ill-appearing, somnolent, arouses to voice  HENT:     Head: Normocephalic and atraumatic.     Comments: Dry mucous membranes    Mouth/Throat:     Mouth: Mucous membranes are dry.     Pharynx: No oropharyngeal exudate.  Eyes:     Conjunctiva/sclera: Conjunctivae normal.     Pupils: Pupils are equal, round, and reactive to light.  Neck:     Musculoskeletal: Normal range of motion and neck supple.     Comments: No meningismus. Cardiovascular:     Rate and Rhythm: Normal rate and regular rhythm.     Heart sounds: Normal heart sounds. No murmur.  Pulmonary:     Effort: Pulmonary effort is normal. No respiratory distress.     Comments: Diminished breath sounds bilaterally Abdominal:     Palpations: Abdomen is soft.     Tenderness: There is abdominal tenderness. There is no guarding or rebound.     Comments: Biliary drain in place draining yellow liquid.  There is no surrounding erythema.  Abdomen is soft, diffusely tender, no guarding or rebound  Musculoskeletal: Normal range of motion.        General: No tenderness.  Skin:    General: Skin is warm.     Capillary Refill: Capillary refill takes less than 2 seconds.     Findings: No rash.  Neurological:     Mental Status: He is alert and oriented to person, place, and time.     Cranial Nerves: No cranial nerve deficit.     Motor: No abnormal muscle tone.     Coordination: Coordination normal.     Comments: Generalized weakness, moves all extremities, no asterixis  Psychiatric:        Behavior: Behavior normal.      ED Treatments / Results  Labs (all labs ordered are listed, but only abnormal results are displayed) Labs Reviewed  CBC WITH DIFFERENTIAL/PLATELET - Abnormal; Notable for the following components:      Result Value   RDW 15.9 (*)    Platelets 47 (*)    Monocytes Absolute 1.6 (*)    All other components  within normal limits  COMPREHENSIVE METABOLIC PANEL - Abnormal; Notable for the following components:   Sodium 125 (*)    Chloride 91 (*)    Glucose, Bld 106 (*)    Creatinine, Ser 1.44 (*)    Calcium 8.3 (*)    Albumin 1.7 (*)    AST 45 (*)    Total Bilirubin 5.5 (*)    GFR calc non Af Amer 51 (*)    GFR calc Af Amer 59 (*)    All other components within normal limits  AMMONIA - Abnormal; Notable for the following components:   Ammonia 63 (*)    All other components within normal limits  PROTIME-INR - Abnormal; Notable for the following components:   Prothrombin Time 28.7 (*)    All other components within normal limits  I-STAT CG4 LACTIC ACID, ED - Abnormal; Notable for the following components:   Lactic Acid, Venous 1.95 (*)    All other components within normal limits  CULTURE, BLOOD (ROUTINE X 2)  CULTURE, BLOOD (ROUTINE X 2)  URINE CULTURE  LIPASE, BLOOD  URINALYSIS, ROUTINE W REFLEX MICROSCOPIC  I-STAT TROPONIN, ED  I-STAT CG4 LACTIC ACID, ED    EKG EKG Interpretation  Date/Time:  Thursday October 02 2018 00:28:11 EST Ventricular Rate:  60 PR Interval:    QRS Duration: 143 QT Interval:  477 QTC Calculation: 477 R Axis:   54 Text Interpretation:  Sinus rhythm Probable left atrial enlargement Right bundle branch block Inferior infarct, old No significant change was found Confirmed by Ezequiel Essex [phone]) on 10/02/2018 12:55:24 AM   Radiology Ct Abdomen Pelvis Wo Contrast  Result Date: 10/02/2018 CLINICAL DATA:  Abdominal pain and weakness. History of cirrhosis and renal failure. EXAM: CT ABDOMEN AND PELVIS WITHOUT CONTRAST TECHNIQUE: Multidetector CT imaging of the abdomen and pelvis was performed following the standard protocol without IV contrast. COMPARISON:  CT abdomen pelvis report dated September 01, 2018. FINDINGS: Lower chest: No acute abnormality. Hepatobiliary: Cirrhotic liver. No focal liver abnormality. Percutaneous cholecystostomy tube in place.  Multiple gallstones. The gallbladder is decompressed with moderate circumferential wall thickening. There is a 7 mm stone in the distal common bile duct. Mild common bile duct dilatation, measuring up to 9 mm. Pancreas: Unremarkable. No pancreatic ductal dilatation or surrounding inflammatory changes. Spleen: Normal in size without focal abnormality. Adrenals/Urinary Tract: Adrenal glands are unremarkable. Kidneys are normal, without renal calculi, focal lesion, or hydronephrosis. Bladder is unremarkable. Stomach/Bowel: Stomach is within normal limits. Appendix appears normal. No evidence of bowel wall thickening, distention, or inflammatory changes. Vascular/Lymphatic: Large esophageal varices and recannulized paraumbilical vein. Aortic atherosclerosis with ectatic bilateral common iliac arteries. Prominent porta hepatis and peripancreatic lymph nodes. Reproductive: Unchanged prostatomegaly. Other: No free fluid or pneumoperitoneum. Musculoskeletal: No acute or significant osseous findings. IMPRESSION: 1. Choledocholithiasis with mild common bile duct dilatation. Based on the prior CT abdomen pelvis report from 09/01/2018, this is a new finding. Images are not currently available due to PACS downtime. Recommend ERCP for further evaluation 2. Percutaneous cholecystostomy tube remains in place with unchanged cholelithiasis. The gallbladder is largely decompressed. Minimal residual pericholecystic inflammatory changes. 3. Cirrhosis with sequelae of portal hypertension including esophageal varices and recannulized paraumbilical vein. 4.  Aortic atherosclerosis (ICD10-I70.0). This exam was interpreted during a PACS downtime with limited availability of comparison cases. It has been flagged for review following the downtime. If clinically indicated after this review, an addendum will be issued providing details about comparison to prior imaging. Electronically Signed   By:  Titus Dubin M.D.   On: 10/02/2018 01:40    Dg Chest 2 View  Result Date: 10/02/2018 CLINICAL DATA:  Abdominal pain and weakness for a few days. Recent discharge from [facility] hospital stay. EXAM: CHEST - 2 VIEW COMPARISON:  09/02/2018 FINDINGS: Postoperative changes in the mediastinum. Cardiac pacemaker. Shallow inspiration. Heart size and pulmonary vascularity are normal. Lungs are clear. No airspace disease or consolidation. No blunting of costophrenic angles. No pneumothorax. Mediastinal contours appear intact. Degenerative changes in the spine and shoulders. IMPRESSION: Shallow inspiration. No evidence of active pulmonary disease. Electronically Signed   By: Lucienne Capers M.D.   On: 10/02/2018 01:04    Procedures Procedures (including critical care time)  Medications Ordered in ED Medications  sodium chloride 0.9 % bolus 1,000 mL (has no administration in time range)     Initial Impression / Assessment and Plan / ED Course  I have reviewed the triage vital signs and the nursing notes.  Pertinent labs & imaging results that were available during my care of the patient were reviewed by me and considered in my medical decision making (see chart for details).    Recent admission for sepsis presenting with failure to thrive, generalized weakness, anorexia.  He is hypotensive on arrival but afebrile.  Cultures of peritoneal fluid and bile fluid during previous hospitalization grew Enterobacter that was resistant to Zosyn but sensitive to cefepime.  Labs today show stable thrombocytopenia.,  Worsening hyponatremia, worsening creatinine.  CT scan concerning for choledocholithiasis and possibly retained stone in the CBD.  IV cefepime started.  Blood pressure improving with hydration.  Patient afebrile.  He is given IV fluids as well as IV antibiotics for new choledocholithiasis.  No fever or leukocytosis to suggest cholangitis.  Will need admission for GI evaluation in the morning with ERCP.  Discussed with family  practice residents.  CRITICAL CARE Performed by: Ezequiel Essex Total critical care time: 35 minutes Critical care time was exclusive of separately billable procedures and treating other patients. Critical care was necessary to treat or prevent imminent or life-threatening deterioration. Critical care was time spent personally by me on the following activities: development of treatment plan with patient and/or surrogate as well as nursing, discussions with consultants, evaluation of patient's response to treatment, examination of patient, obtaining history from patient or surrogate, ordering and performing treatments and interventions, ordering and review of laboratory studies, ordering and review of radiographic studies, pulse oximetry and re-evaluation of patient's condition.  Final Clinical Impressions(s) / ED Diagnoses   Final diagnoses:  Choledocholithiasis    ED Discharge Orders    None       Glenden Rossell, Annie Main, MD 10/02/18 [phone]

## 2018-10-02 NOTE — Evaluation (Signed)
Physical Therapy Evaluation Patient Details Name: Hunter Carter MRN: [id number] DOB: [birth year] Today's Date: 10/02/2018   History of Present Illness  Patient is a 67 y/o male presenting to the ED on 10/01/18 with primary complaints of abdominal pain and weakness. Of note, recent hospital admission acute cholecystitis requiring percutaneous cholecystostomy drain. PMH significant for paroxysmal a-fib, cirrhosis 2/2 alcohol use and hepatitis C, CHF, HTN, hyponatremia, depression.    Clinical Impression  Hunter Carter is a very pleasant 67 y/o male admitted with the above listed diagnosis. Patient reporting need for use of AD for household mobility, and increased physical assist for ADLs at home prior to admission. Patient today requiring up to [place] for transfers and mobility with noted unsteadiness throughout. Patient to benefit from short term rehab at admission to progress safety and independence with functional mobility and ADLs. PT to continue to follow acutely.     Follow Up Recommendations SNF;Supervision/Assistance - 24 hour    Equipment Recommendations  None recommended by PT    Recommendations for Other Services       Precautions / Restrictions Precautions Precautions: Fall Restrictions Weight Bearing Restrictions: No      Mobility  Bed Mobility Overal bed mobility: Needs Assistance Bed Mobility: Supine to Sit;Sit to Supine     Supine to sit: Min assist Sit to supine: Min guard   General bed mobility comments: light Min A to come to edge of stretcher for safety and efficiency  Transfers Overall transfer level: Needs assistance Equipment used: Rolling walker (2 wheeled) Transfers: Sit to/from Stand Sit to Stand: Min guard;From elevated surface         General transfer comment: elevated surface to transfer from; close min guard for immediate standing balance  Ambulation/Gait Ambulation/Gait assistance: Min assist;Min guard Gait Distance (Feet): 80  Feet Assistive device: Rolling walker (2 wheeled) Gait Pattern/deviations: Step-through pattern;Decreased stride length;Drifts right/left Gait velocity: decreased   General Gait Details: light Min A for RW management and stability; mild unsteadiness throughout  Stairs            Wheelchair Mobility    Modified Rankin (Stroke Patients Only)       Balance Overall balance assessment: Needs assistance Sitting-balance support: Single extremity supported;Feet supported Sitting balance-Leahy Scale: Fair     Standing balance support: Bilateral upper extremity supported;During functional activity Standing balance-Leahy Scale: Poor Standing balance comment: reliant on UE support                             Pertinent Vitals/Pain Pain Assessment: 0-10 Pain Score: 6  Pain Location: abdomen Pain Descriptors / Indicators: Aching;Discomfort;Guarding;Grimacing Pain Intervention(s): Limited activity within patient's tolerance;Monitored during session    Home Living Family/patient expects to be discharged to:: Private residence Living Arrangements: Alone Available Help at Discharge: Family;Available PRN/intermittently Type of Home: Apartment Home Access: Level entry     Home Layout: One level Home Equipment: Walker - 2 wheels;Cane - single point Additional Comments: patient lives alone; family stating they are trying to provide 24/hr care, however has not been able to do so yet    Prior Function Level of Independence: Needs assistance   Gait / Transfers Assistance Needed: use of RW for household ambulation  ADL's / Homemaking Assistance Needed: requires asssit from family for all ADLs, cooking, cleaning        Hand Dominance        Extremity/Trunk Assessment   Upper Extremity Assessment Upper Extremity Assessment: Defer  to OT evaluation    Lower Extremity Assessment Lower Extremity Assessment: Generalized weakness    Cervical / Trunk  Assessment Cervical / Trunk Assessment: Normal  Communication   Communication: No difficulties  Cognition Arousal/Alertness: Awake/alert Behavior During Therapy: WFL for tasks assessed/performed Overall Cognitive Status: Within Functional Limits for tasks assessed                                        General Comments General comments (skin integrity, edema, etc.): VSS throughout session; BP 113/78 at rest, 114/73 following mobility; SpO2 >92% on RA with mobility    Exercises     Assessment/Plan    PT Assessment Patient needs continued PT services  PT Problem List Decreased strength;Decreased activity tolerance;Decreased balance;Decreased mobility;Decreased safety awareness       PT Treatment Interventions DME instruction;Gait training;Functional mobility training;Therapeutic activities;Therapeutic exercise;Balance training;Patient/family education    PT Goals (Current goals can be found in the Care Plan section)  Acute Rehab PT Goals Patient Stated Goal: regain strength/mobility PT Goal Formulation: With patient Time For Goal Achievement: 10/16/18 Potential to Achieve Goals: Good    Frequency Min 3X/week   Barriers to discharge Decreased caregiver support lives alone; family unable to provide 24hr care/support    Co-evaluation               AM-PAC PT "6 Clicks" Mobility  Outcome Measure Help needed turning from your back to your side while in a flat bed without using bedrails?: A Little Help needed moving from lying on your back to sitting on the side of a flat bed without using bedrails?: A Little Help needed moving to and from a bed to a chair (including a wheelchair)?: A Little Help needed standing up from a chair using your arms (e.g., wheelchair or bedside chair)?: A Little Help needed to walk in hospital room?: A Little Help needed climbing 3-5 steps with a railing? : A Lot 6 Click Score: 17    End of Session Equipment Utilized During  Treatment: Gait belt Activity Tolerance: Patient tolerated treatment well Patient left: in bed;with call bell/phone within reach;with family/visitor present Nurse Communication: Mobility status PT Visit Diagnosis: Unsteadiness on feet (R26.81);Other abnormalities of gait and mobility (R26.89);Muscle weakness (generalized) (M62.81)    Time: 3845-3646 PT Time Calculation (min) (ACUTE ONLY): 25 min   Charges:   PT Evaluation $PT Eval Moderate Complexity: 1 Mod          Lanney Gins, PT, DPT Supplemental Physical Therapist 10/02/18 10:42 AM Pager: [phone] Office: [phone]

## 2018-10-02 NOTE — Progress Notes (Signed)
Family Medicine Teaching Service Daily Progress Note Intern Pager: [phone]  Patient name: Hunter Carter Medical record number: [id number] Date of birth: [birth year] Age: 67 y.o. Gender: male  Primary Care Provider: Nuala Alpha, DO Consultants: None Code Status: DNR  Pt Overview and Major Events to Date:  12/19 - admit to [facility] for abdominal pain  Assessment and Plan: Hunter Carter is a 67 y.o. male with ESLD presenting with abdominal pain and weakness concerning for choledocholithiasis.  PMH is significant for paroxysmal a-fib, cirrhosis 2/2 alcohol use and hepatitis C, CHF, HTN, hyponatremia, depression   Diarrhea and generalized weakness/fatigue - multifactorial in the setting of ESLD and lactulose use.  Less likely infectious with no leukocytosis, no fevers. Patient endorses 1-2 days of watery diarrhea and he was concerned that he was getting dehydrated so he came in. Ammonia 63 on admit. - restart IVF NS 125 cc/hr - holding lactulose, ammonia 63  - consider restarting lactulose at lower dose - PT/OT  Hypotension - hypertensive at baseline - low BPs likely in setting of sensible fluid losses with diarrhea. Patient hypotensive to 88/60 this AM, MAPs have been >65.  - IVF NS 125cc/hr - holding furosemide, lactulose and spironolactone until BPs improve - holding home losartan due to AKI, hypotension - half-dose home metoprolol in setting of hypotension to keep rate control for paroxysmal afib  Acute on RUQ Abdominal pain, improved this AM - likely due to 7 mm stone in distal CBD on CT abdomen with decompressed GB and cholecystostomy drain in place, no pancreatitis per imaging, no elevated lipase. This stone was previously seen in the cystic duct on imaging 09/01/2018. Less likely cholangitis given no fevers, no leukocytosis.  - cefepime on board (12/19 - ) > continue? - consider consult to GI - zofran PRN nausea  Hyponatremia - chronic in setting of ESLD. Baseline appears to  be 130-133. Na 125 this AM. - IVF with NS 125cc/hr  -recheck PM BMP  ESLD - hx significant alcohol use and hep C infection (treated).  Patient is on furosemide, spironolactone, and lactulose at home.  He states he has a prescription for rifaxamin but has not filled it yet.   - hold all home meds in setting of hypotension - palliative consult   Paroxysmal atrial tachycardia and atrial fibrillation w/ pacemaker - Not anticoagulated due to cirrhosis. On exam he had regular rhythm. . EKG shows sinus rhythm with RBB, consistent with previous EKGs.  - cardiac monitoring - half-dose metoprolol 25 mg tartrate BID in setting of hypotension for continued rate control  AKI - BL Cr <1.0. Cr on admit 1.44. Possible new BL vs prerenal given recent diarrhea. - IVF   - f/u bmp  Dysuria - patient complained of 3-4 days of urinary frequency on admission, also noted dysuria on admission. Abx (ancef) started before UA collected. Trace leuks, no bacteria. - monitor  Depression - patient takes lexapro 10mg  at home.  - continue lexapro  FEN/GI: 100 ml/hr NS. NPO. PPI Prophylaxis: SCD  Subjective:  Patient doing well this AM. Abdominal pain was RUQ and significantly improved this AM. Denies further episodes of diarrhea, though reports this is because he has not gotten out of bed. If he had been moving around he feels he would have had further episodes. No vomiting.   Objective: Temp:  [98 F (36.7 C)-98.8 F (37.1 C)] 98.8 F (37.1 C) (12/19 0218) Pulse Rate:  [59-66] 63 (12/19 0733) Resp:  [12-18] 14 (12/19 0733) BP: (82-101)/(58-72) 101/62 (12/19  7062) SpO2:  [92 %-96 %] 94 % (12/19 0733) Weight:  [83 kg] 83 kg (12/19 0031) Physical Exam: General: NAD, rests comfortably, pleasant and oriented Cardiovascular: RRR, no m/r/g Respiratory: CTA bil, no W/R/R Abdomen: soft, nondistended. Mildly ttp RUQ with deep palpation but negative murphy sign. Positive bowel sounds. No HSM  apprecaited. Extremities: warm and well perfused, no edema  Laboratory: Recent Labs  Lab 10/02/18 0045 10/02/18 0620  WBC 6.1 6.2  HGB 14.4 11.8*  HCT 41.2 35.0*  PLT 47* 34*   Recent Labs  Lab 10/02/18 0045  NA 125*  K 4.4  CL 91*  CO2 25  BUN 11  CREATININE 1.44*  CALCIUM 8.3*  PROT 8.1  BILITOT 5.5*  ALKPHOS 81  ALT 30  AST 45*  GLUCOSE 106*      Imaging/Diagnostic Tests: Ct Abdomen Pelvis Wo Contrast  Addendum Date: 10/02/2018   ADDENDUM REPORT: 10/02/2018 03:20 ADDENDUM: The current study can now be compared to the most recent CT from 09/01/2018. The gallstone now in the distal common bile duct was previously seen in the region of the cystic duct. There was no biliary dilatation at that time. The pericholecystic inflammatory changes have significantly improved. Previously seen small ascites and bilateral pleural effusions have resolved. Electronically Signed   By: Titus Dubin M.D.   On: 10/02/2018 03:20   Result Date: 10/02/2018 CLINICAL DATA:  Abdominal pain and weakness. History of cirrhosis and renal failure. EXAM: CT ABDOMEN AND PELVIS WITHOUT CONTRAST TECHNIQUE: Multidetector CT imaging of the abdomen and pelvis was performed following the standard protocol without IV contrast. COMPARISON:  CT abdomen pelvis report dated September 01, 2018. FINDINGS: Lower chest: No acute abnormality. Hepatobiliary: Cirrhotic liver. No focal liver abnormality. Percutaneous cholecystostomy tube in place. Multiple gallstones. The gallbladder is decompressed with moderate circumferential wall thickening. There is a 7 mm stone in the distal common bile duct. Mild common bile duct dilatation, measuring up to 9 mm. Pancreas: Unremarkable. No pancreatic ductal dilatation or surrounding inflammatory changes. Spleen: Normal in size without focal abnormality. Adrenals/Urinary Tract: Adrenal glands are unremarkable. Kidneys are normal, without renal calculi, focal lesion, or hydronephrosis.  Bladder is unremarkable. Stomach/Bowel: Stomach is within normal limits. Appendix appears normal. No evidence of bowel wall thickening, distention, or inflammatory changes. Vascular/Lymphatic: Large esophageal varices and recannulized paraumbilical vein. Aortic atherosclerosis with ectatic bilateral common iliac arteries. Prominent porta hepatis and peripancreatic lymph nodes. Reproductive: Unchanged prostatomegaly. Other: No free fluid or pneumoperitoneum. Musculoskeletal: No acute or significant osseous findings. IMPRESSION: 1. Choledocholithiasis with mild common bile duct dilatation. Based on the prior CT abdomen pelvis report from 09/01/2018, this is a new finding. Images are not currently available due to PACS downtime. Recommend ERCP for further evaluation 2. Percutaneous cholecystostomy tube remains in place with unchanged cholelithiasis. The gallbladder is largely decompressed. Minimal residual pericholecystic inflammatory changes. 3. Cirrhosis with sequelae of portal hypertension including esophageal varices and recannulized paraumbilical vein. 4.  Aortic atherosclerosis (ICD10-I70.0). This exam was interpreted during a PACS downtime with limited availability of comparison cases. It has been flagged for review following the downtime. If clinically indicated after this review, an addendum will be issued providing details about comparison to prior imaging. Electronically Signed: By: Titus Dubin M.D. On: 10/02/2018 01:40   Dg Chest 2 View  Result Date: 10/02/2018 CLINICAL DATA:  Abdominal pain and weakness for a few days. Recent discharge from [facility] after hospital stay. EXAM: CHEST - 2 VIEW COMPARISON:  09/02/2018 FINDINGS: Postoperative changes in the  mediastinum. Cardiac pacemaker. Shallow inspiration. Heart size and pulmonary vascularity are normal. Lungs are clear. No airspace disease or consolidation. No blunting of costophrenic angles. No pneumothorax. Mediastinal contours appear intact.  Degenerative changes in the spine and shoulders. IMPRESSION: Shallow inspiration. No evidence of active pulmonary disease. Electronically Signed   By: Lucienne Capers M.D.   On: 10/02/2018 01:04     Everrett Coombe, MD 10/02/2018, 7:44 AM PGY-3, [facility] Intern pager: [phone], text pages welcome

## 2018-10-02 NOTE — Social Work (Signed)
CSW recently discharged to a SNF within the last 60 days, if pt is in copay days he may have a copay due to no secondary coverage. ED note states that pt was attempting to connect with palliative and hospice services. Will follow for clarification of disposition.  Westley Hummer, MSW, [place] Work [phone]

## 2018-10-02 NOTE — ED Notes (Signed)
Hospital bed ordered for the pt

## 2018-10-02 NOTE — Progress Notes (Signed)
Pharmacy Antibiotic Note  Hunter Carter is a 67 y.o. male admitted on 10/02/2018 with intra-abdominal infection.  Pharmacy has been consulted for Cefepime dosing. WBC WNL. Mild bump in Scr.   Plan: Cefepime 1g IV q8h Trend WBC, temp, renal function  F/U infectious work-up  Height: 5\' 6"  (167.6 cm) Weight: 183 lb (83 kg) IBW/kg (Calculated) : 63.8  Temp (24hrs), Avg:98.4 F (36.9 C), Min:98 F (36.7 C), Max:98.8 F (37.1 C)  Recent Labs  Lab 10/02/18 0043 10/02/18 0045 10/02/18 0310  WBC  --  6.1  --   CREATININE  --  1.44*  --   LATICACIDVEN 1.95*  --  1.74    Estimated Creatinine Clearance: 51.7 mL/min (A) (by C-G formula based on SCr of 1.44 mg/dL (H)).    No Known Allergies   Narda Bonds 10/02/2018 4:17 AM

## 2018-10-02 NOTE — Progress Notes (Signed)
FPTS Interim Progress Note  Spoke with Interventional Radiology over the phone to discuss possibility of using patient's gallbladder tube from previous cholecystotomy to access and remove stone from common bile duct as patient presents with choledolithasis. Per my conversation with IR, they do not believe they would be able to access the duct and any attempt to do so would not be successful. Appreciate IR's recs and willingness to discuss best plan for patient.  Nuala Alpha, DO 10/02/2018, 4:29 PM PGY-2, [facility] Medicine Service pager [phone]

## 2018-10-03 DIAGNOSIS — Z515 Encounter for palliative care: Secondary | ICD-10-CM

## 2018-10-03 DIAGNOSIS — Z7189 Other specified counseling: Secondary | ICD-10-CM

## 2018-10-03 DIAGNOSIS — K7290 Hepatic failure, unspecified without coma: Secondary | ICD-10-CM

## 2018-10-03 DIAGNOSIS — K7210 Chronic hepatic failure without coma: Secondary | ICD-10-CM

## 2018-10-03 DIAGNOSIS — E440 Moderate protein-calorie malnutrition: Secondary | ICD-10-CM

## 2018-10-03 LAB — BASIC METABOLIC PANEL
Anion gap: 7 (ref 5–15)
BUN: 13 mg/dL (ref 8–23)
CO2: 23 mmol/L (ref 22–32)
Calcium: 7.9 mg/dL — ABNORMAL LOW (ref 8.9–10.3)
Chloride: 98 mmol/L (ref 98–111)
Creatinine, Ser: 1.2 mg/dL (ref 0.61–1.24)
GFR calc Af Amer: >60 mL/min (ref >60)
GFR calc non Af Amer: >60 mL/min (ref >60)
Glucose, Bld: 93 mg/dL (ref 70–99)
Potassium: 4.4 mmol/L (ref 3.5–5.1)
Sodium: 128 mmol/L — ABNORMAL LOW (ref 135–145)

## 2018-10-03 LAB — URINE CULTURE

## 2018-10-03 LAB — HEPATIC FUNCTION PANEL
ALBUMIN: 1.3 g/dL — AB (ref 3.5–5.0)
ALT: 24 U/L (ref 0–44)
AST: 38 U/L (ref 15–41)
Alkaline Phosphatase: 61 U/L (ref 38–126)
Bilirubin, Direct: 1.8 mg/dL — ABNORMAL HIGH (ref 0.0–0.2)
Indirect Bilirubin: 3.3 mg/dL — ABNORMAL HIGH (ref 0.3–0.9)
Total Bilirubin: 5.1 mg/dL — ABNORMAL HIGH (ref 0.3–1.2)
Total Protein: 6.4 g/dL — ABNORMAL LOW (ref 6.5–8.1)

## 2018-10-03 LAB — CBC
HEMATOCRIT: 33.7 % — AB (ref 39.0–52.0)
Hemoglobin: 11.8 g/dL — ABNORMAL LOW (ref 13.0–17.0)
MCH: 34 pg (ref 26.0–34.0)
MCHC: 35 g/dL (ref 30.0–36.0)
MCV: 97.1 fL (ref 80.0–100.0)
Platelets: 43 10*3/uL — ABNORMAL LOW (ref 150–400)
RBC: 3.47 MIL/uL — ABNORMAL LOW (ref 4.22–5.81)
RDW: 15.9 % — ABNORMAL HIGH (ref 11.5–15.5)
WBC: 5.3 10*3/uL (ref 4.0–10.5)
nRBC: 0 % (ref 0.0–0.2)

## 2018-10-03 LAB — C DIFFICILE QUICK SCREEN W PCR REFLEX
C Diff antigen: NEGATIVE
C Diff interpretation: NOT DETECTED
C Diff toxin: NEGATIVE

## 2018-10-03 MED ORDER — RIFAXIMIN 550 MG PO TABS
550.0000 mg | ORAL_TABLET | Freq: Two times a day (BID) | ORAL | Status: DC
  Administered 2018-10-03 – 2018-10-06 (×6): 550 mg via ORAL
  Filled 2018-10-03 (×6): qty 1

## 2018-10-03 NOTE — Progress Notes (Addendum)
Family Medicine Teaching Service Daily Progress Note Intern Pager: [phone]  Patient name: Hunter Carter Medical record number: [id number] Date of birth: 1950-12-07 Age: 67 y.o. Gender: male  Primary Care Provider: Nuala Alpha, DO Consultants: None Code Status: DNR  Pt Overview and Major Events to Date:  12/19 - admit to [facility] for abdominal pain  Assessment and Plan: [name] is a 67 y.o. male with ESLD presenting with abdominal pain and weakness concerning for choledocholithiasis.  PMH is significant for paroxysmal a-fib, cirrhosis 2/2 alcohol use and hepatitis C, CHF, HTN, hyponatremia, depression.   Acute on RUQ Abdominal pain, in settting of choledocholithiasis: Improving.   Minimal pain this am.  Likely due to 7 mm stone in the distal CBD on CT abdomen. Stone previously seen in cystic duct on 08/2018 imaging.  Spoke with IR yesterday, would not be able to retrieve stone via percutaneous tube. - GI consulted, appreciate further recommendations, question any intervention needed (ERCP?)   - Zofran PRN nausea   - Already has IR follow-up scheduled to monitor his percutaneous tube  Diarrhea and generalized weakness/fatigue: Unchanged.  Likely multifactorial in the setting of ESLD and lactulose use.  Less likely infectious with no leukocytosis, no fevers.  - holding lactulose, ammonia 63  - IVF NS 125 mL/hour - Obtain C. difficile PCR - PT/OT> recommending SNF   Hypotension in setting of chronic hypertension: Stable MAPs generally remaining >65.  Suspect lower blood pressures is his new baseline, remained hypotensive for his last admission as well.  Likely continuing in the setting of sensible fluid losses with his diarrhea. - IVF NS 125cc/hr - holding furosemide, lactulose and spironolactone until BPs improve - holding home losartan due to AKI, hypotension - Continue 1/2 half-dose home metoprolol in setting of hypotension to keep rate control for paroxysmal  afib  Hyponatremia: Chronic, stable.  NA 128 this a.m.  Baseline around 130-133.  Chronically in setting of liver cirrhosis.  - IVF with NS 125cc/hr  -Monitor BMP  ESLD: Chronic.  Via previous significant history of alcohol use and hepatitis C infection that was treated.  -Holding all home meds in setting of hypotension-monitor mental status -Consulted palliative, appreciate further recommendations  Paroxysmal atrial tachycardia and atrial fibrillation w/ pacemaker : Stable.  RRR on exam.  Not anticoagulated due to cirrhosis.   - cardiac monitoring - half-dose metoprolol 25 mg tartrate BID in setting of hypotension for continued rate control  AKI: Improving  Cr 1.20, baseline <1.0.  Likely prerenal in the setting of diarrhea, hypovolemia.  - IVF   - f/u bmp  Dysuria: Resolved. Not endorsing any dysuria this morning.  Urine culture with multiple species present.   - monitor for symptomatology  Depression: Stable.  - Continue home Lexapro  FEN/GI: 120 ml/hr NS. Heart healthy diet  Prophylaxis: SCD, pts 43   Subjective:  Doing well this morning, denies any abdominal pain, has some minimal nausea.  No emesis.  Continues to have watery bowel movements, denies any hematochezia or melena.  Has spoken with his family, is ready to make a meeting with palliative to discuss his future goals of care.  Objective: Temp:  [97.4 F (36.3 C)-97.8 F (36.6 C)] 97.8 F (36.6 C) (12/20 0944) Pulse Rate:  [58-68] 59 (12/20 0944) Resp:  [12-17] 16 (12/20 0944) BP: (84-120)/(59-77) 95/60 (12/20 0944) SpO2:  [91 %-100 %] 96 % (12/20 0944) Physical Exam: General: Alert, NAD HEENT: NCAT, MM dry Cardiac: RRR no m/g/r Lungs: Clear bilaterally, no increased WOB  Abdomen: soft, mildly tender to right upper quadrant, non-distended, normoactive BS, cholecystostomy tube in place with serosanguineous drainage. Msk: Moves all extremities spontaneously  Ext: Warm, dry, 2+ distal pulses, no edema     Laboratory: Recent Labs  Lab 10/02/18 0045 10/02/18 0620 10/03/18 0234  WBC 6.1 6.2 5.3  HGB 14.4 11.8* 11.8*  HCT 41.2 35.0* 33.7*  PLT 47* 34* 43*   Recent Labs  Lab 10/02/18 0045 10/02/18 0754 10/03/18 0234  NA 125* 128* 128*  K 4.4 4.7 4.4  CL 91* 94* 98  CO2 25 26 23   BUN 11 11 13   CREATININE 1.44* 1.28* 1.20  CALCIUM 8.3* 7.7* 7.9*  PROT 8.1 6.8 6.4*  BILITOT 5.5* 4.5* 5.1*  ALKPHOS 81 66 61  ALT 30 25 24   AST 45* 37 38  GLUCOSE 106* 95 93      Imaging/Diagnostic Tests: No results found.   Patriciaann Clan, DO 10/03/2018, 9:52 AM PGY-1, [facility] Intern pager: [phone], text pages welcome

## 2018-10-03 NOTE — Consult Note (Signed)
Consultation Note Date: 10/03/2018   Patient Name: Hunter Carter  DOB: [birth year]  MRN: [id number]  Age / Sex: 67 y.o., male  PCP: Nuala Alpha, DO Referring Physician: No att. providers found  Reason for Consultation: Establishing goals of care  HPI/Patient Profile: 67 y.o. male  with past medical history of cirrhosis secondary to ETOH use and hep C, a fib, HTN, diastolic heart failure, and CAD admitted on 10/02/2018 with abdominal pain, weakness, and diarrhea. He had a recent admission for cholelithiasis and placement of cholestotomy drain. He has been admitted 4 times in the past 6 months. He was admitted from home, but had a recent stay at a rehab facility. Diagnosed with choledocholithiasis. Patient with ESLD - holding all home meds d/t hypotension. PMT consulted for [place].   Clinical Assessment and Goals of Care: I have reviewed medical records including EPIC notes, labs and imaging, received report from RN, assessed the patient and then met with patient and daughter, [name], to discuss diagnosis prognosis, GOC, EOL wishes, disposition and options.  I introduced Palliative Medicine as specialized medical care for people living with serious illness. It focuses on providing relief from the symptoms and stress of a serious illness. The goal is to improve quality of life for both the patient and the family.  We discussed a brief life review of the patient. He tells me he played football and tennis at [employer]. His career was in Architect. He has been married twice and lost both wives to cancer. His has 5 children. He tells me his children are all involved and provide a great amount of support to him. He tells me of his faith and the role it plays in dealing with his illness and mortality.   As far as functional and nutritional status, he tells me he can walk short distances with a walker - he is limited by weakness and  fatigue. He is able to complete ADLs independently. Appetite is good.   We discussed his current illness and what it means in the larger context of his on-going co-morbidities.  Natural disease trajectory and expectations at EOL were discussed. He tells me he is aware he is approaching end of life - his GI doctor discussed this with him a few weeks ago.   I attempted to elicit values and goals of care important to the patient.    The difference between aggressive medical intervention and comfort care was considered in light of the patient's goals of care. Patient would like to focus on his comfort. Family agrees.  Advance directives, concepts specific to code status, artifical feeding and hydration, and rehospitalization were considered and discussed. We completed a MOST form reflecting the following: DNR, Comfort measures - do NOT hospitalize, antibiotics/IV fluids if indicated, and no feeding tube. MOST form was signed and placed on chart along with DNR.   Hospice and Palliative Care services outpatient were explained and offered. Patient is interested in hospice care; however, family does not feel they can provide the care he needs at home. Therefore, they would like to pursue rehab placement with palliative to follow and are aware he will need hospice care soon.   Questions and concerns were addressed. The family was encouraged to call with questions or concerns.   Primary Decision Maker PATIENT  SUMMARY OF RECOMMENDATIONS   - patient and family understand severity of liver disease and that he is nearing end of life - all questions addressed - MOST completed to reflect DNR, do NOT  hospitalize, NO feeding tube - signed and placed on chart with DNR form - patient and family would like hospice but they do not feel they can provide the amount of care he needs at home and he is not yet residential hospice appropriate; therefore, the plan is for patient to go to rehab with palliative to follow -  family aware patient will need the support of hospice soon - **Discharging MD please write for outpatient palliative to follow at SNF in discharge summary**  Code Status/Advance Care Planning:  DNR  Symptom Management:   Denies - per primary  Palliative Prophylaxis:   Aspiration, Delirium Protocol and Frequent Pain Assessment  Additional Recommendations (Limitations, Scope, Preferences):  Avoid Hospitalization and No Artificial Feeding  Psycho-social/Spiritual:   Desire for further Chaplaincy support:no  Additional Recommendations: Education on Hospice  Prognosis:   < 6 months  Discharge Planning: [facility] for rehab with Palliative care service follow-up      Primary Diagnoses: Present on Admission: . Choledocholithiasis   I have reviewed the medical record, interviewed the patient and family, and examined the patient. The following aspects are pertinent.  Past Medical History:  Diagnosis Date  . Acute hepatic encephalopathy 10/10/2015   [name] 10/10/2015  . Arthritis    "joints ache" (10/10/2015)  . Bleeding stomach ulcer    "recently" (10/10/2015)  . CAD (coronary artery disease)   . CHF (congestive heart failure) ([place])   . Cirrhosis ([facility])    due to HCV/notes 10/10/2015  . ETOH abuse   . GERD (gastroesophageal reflux disease)   . Heart block AV second degree    permanent pacemaker 02/2004  . Hepatitis C   . Hyperlipidemia   . Inferior MI ([place]) 2005  . Presence of permanent cardiac pacemaker   . RBBB   . Systemic hypertension   . Tubulovillous adenoma of colon   . Ventricular tachycardia ([facility])    nonsustained   Social History   Socioeconomic History  . Marital status: Widowed    Spouse name: Not on file  . Number of children: Not on file  . Years of education: Not on file  . Highest education level: Not on file  Occupational History  . Not on file  Social Needs  . Financial resource strain: Not on file  . Food insecurity:      Worry: Not on file    Inability: Not on file  . Transportation needs:    Medical: Not on file    Non-medical: Not on file  Tobacco Use  . Smoking status: Former Smoker    Packs/day: 0.25    Years: 41.00    Pack years: 10.25    Types: Cigarettes    Last attempt to quit: 11/15/2017    Years since quitting: 0.8  . Smokeless tobacco: Never Used  Substance and Sexual Activity  . Alcohol use: Yes    Comment: 10/10/2015 "last drink was 2-3 months ago; h/o abuse"  . Drug use: Yes    Types: Marijuana    Comment: last use 2015   . Sexual activity: Not Currently  Lifestyle  . Physical activity:    Days per week: Not on file    Minutes per session: Not on file  . Stress: Not on file  Relationships  . Social connections:    Talks on phone: Not on file    Gets together: Not on file    Attends religious service: Not on file    Active member of club or  organization: Not on file    Attends meetings of clubs or organizations: Not on file    Relationship status: Not on file  Other Topics Concern  . Not on file  Social History Narrative  . Not on file   Family History  Problem Relation Age of Onset  . Heart failure Mother   . Liver disease Father   . Heart failure Sister   . Hypertension Sister   . Hypertension Sister   . Colon cancer Neg Hx    Scheduled Meds: . escitalopram  10 mg Oral Daily  . feeding supplement (ENSURE ENLIVE)  237 mL Oral BID BM  . metoprolol tartrate  25 mg Oral BID  . pantoprazole  40 mg Oral Daily  . sodium chloride flush  3 mL Intravenous Q12H   Continuous Infusions: . sodium chloride 125 mL/hr at 10/03/18 0600   PRN Meds:.acetaminophen **OR** acetaminophen, albuterol, ondansetron **OR** ondansetron (ZOFRAN) IV No Known Allergies Review of Systems  Constitutional: Positive for activity change and fatigue.  Cardiovascular: Positive for leg swelling.  Gastrointestinal: Positive for abdominal distention.  Neurological: Positive for weakness.     Physical Exam Constitutional:      General: He is not in acute distress.    Appearance: He is not diaphoretic.  HENT:     Head: Normocephalic and atraumatic.  Cardiovascular:     Rate and Rhythm: Normal rate.  Pulmonary:     Effort: Pulmonary effort is normal. No respiratory distress.     Breath sounds: Normal breath sounds.  Abdominal:     General: There is distension.  Musculoskeletal:     Right lower leg: Edema present.     Left lower leg: Edema present.  Skin:    General: Skin is warm and dry.  Neurological:     Mental Status: He is alert and oriented to person, place, and time.  Psychiatric:        Mood and Affect: Mood normal.     Vital Signs: BP 95/60 (BP Location: Left Arm)   Pulse (!) 59   Temp 97.8 F (36.6 C) (Oral)   Resp 16   Ht '5\' 6"'$  (1.676 m)   Wt 83 kg   SpO2 96%   BMI 29.54 kg/m  Pain Scale: 0-10   Pain Score: 0-No pain   SpO2: SpO2: 96 % O2 Device:SpO2: 96 % O2 Flow Rate: .   IO: Intake/output summary:   Intake/Output Summary (Last 24 hours) at 10/03/2018 1114 Last data filed at 10/03/2018 0911 Gross per 24 hour  Intake 1878 ml  Output 950 ml  Net 928 ml    LBM: Last BM Date: (pta) Baseline Weight: Weight: 83 kg Most recent weight: Weight: 83 kg     Palliative Assessment/Data: PPS 50%    Time Total: 70 minutes Greater than 50%  of this time was spent counseling and coordinating care related to the above assessment and plan.  Juel Burrow, DNP, AGNP-C Palliative Medicine Team [phone] Pager: [phone]

## 2018-10-03 NOTE — Progress Notes (Signed)
Initial Nutrition Assessment  DOCUMENTATION CODES:   Non-severe (moderate) malnutrition in context of acute illness/injury  INTERVENTION:   - Continue Ensure Enlive BID (each provides 350 kcal and 20 g protein) - Pt may benefit from bowel regimen to alleviate diarrhea   NUTRITION DIAGNOSIS:   Moderate Malnutrition related to acute illness, chronic illness as evidenced by mild fat depletion, moderate muscle depletion, percent weight loss.   GOAL:   Patient will meet greater than or equal to 90% of their needs   MONITOR:   PO intake, Supplement acceptance, Weight trends, Labs  REASON FOR ASSESSMENT:   Malnutrition Screening Tool    ASSESSMENT:   67 yo male, admitted with choledocholithiasis. PMH significant for paroxysmal atrial fibrillation, cirrhosis 2/2 alcohol use and hepatitis C, CHF, HTN, hyponatremia, hyperkalemia, former smoker.   Labs: sodium 128, Creatinine 1.28 on 12/19 and WNL today, tProtein 6.4, dBili 1.8 (H) and trending up, iBili 5.1 (H) and trending up, Hgb 11.8, Hct 33.7% Meds: Ensure Enlive BID between meals, Protonix EC 40 mg, NS infusion 125 mL/hr  Pt seated in chair at time of visit.  Pt states his appetite is alright, ate some oatmeal for breakfast. Typically, pt eats 3-4 small meals daily when hungry. Follows a low sodium diet at home. Does not take any vitamin/mineral supplements.   Pt reports UBW 220# before he got sick and endorses wt loss. Per chart, 18.8 kg wt loss since Oct 2019 --> ~19% wt loss in 3 months is very significant.  Reports some nausea, but no vomiting. Pt endorses frequent diarrhea. Takes lactulose at home. Encouraged pt to incorporate some low fiber foods to help slow down gut motility. Pt states he sometimes has trouble chewing, but no difficulty swallowing.  Encouraged pt to include protein-rich foods with all meals and snacks, and to eat those foods first if not feeling hungry. Pt amenable to continuing Ensure Enlive BID as  ordered.   NUTRITION - FOCUSED PHYSICAL EXAM:   Most Recent Value  Orbital Region  Mild depletion  Upper Arm Region  Mild depletion  Thoracic and Lumbar Region  Mild depletion  Buccal Region  Mild depletion  Temple Region  Moderate depletion  Clavicle Bone Region  Mild depletion  Clavicle and Acromion Bone Region  Mild depletion  Scapular Bone Region  Moderate depletion  Dorsal Hand  Moderate depletion  Patellar Region  No depletion  Anterior Thigh Region  No depletion  Posterior Calf Region  No depletion  Edema (RD Assessment)  None  Hair  Reviewed  Eyes  Reviewed  Mouth  Reviewed  Skin  Reviewed  Nails  Reviewed      Diet Order:  50% of breakfast completed today, per nsg documentation Diet Order            Diet Heart Room service appropriate? Yes; Fluid consistency: Thin  Diet effective now              EDUCATION NEEDS:  No education needs have been identified at this time  Skin:  Skin Assessment: Reviewed RN Assessment  Last BM:  PTA  Height:  Ht Readings from Last 1 Encounters:  10/02/18 5\' 6"  (1.676 m)    Weight:  Wt Readings from Last 1 Encounters:  10/02/18 83 kg    Ideal Body Weight:  64.5 kg  BMI:  Body mass index is 29.54 kg/m.  Estimated Nutritional Needs:   Kcal:  7824-2353 calories daily (30-35 kcal/kg IBW)  Protein:  77-97 gm daily (1.2-1.5 g/kg  IBW)  Fluid:  >/= 1.9 L daily or per MD discretion  Althea Grimmer, MS, RDN, LDN Pager: [phone]

## 2018-10-03 NOTE — Evaluation (Signed)
Occupational Therapy Evaluation Patient Details Name: Hunter Carter MRN: [id number] DOB: 17-Nov-1950 Today's Date: 10/03/2018    History of Present Illness Patient is a 67 y/o male presenting to the ED on 10/01/18 with primary complaints of abdominal pain and weakness. Of note, recent hospital admission acute cholecystitis requiring percutaneous cholecystostomy drain. PMH significant for paroxysmal a-fib, cirrhosis 2/2 alcohol use and hepatitis C, CHF, HTN, hyponatremia, depression.   Clinical Impression   Pt is a 67 yo male s/p above diagnosis. Pt PLOF: pt was recently at [facility] for long placement and was home less than 24 hours before being admitted here. Pt performing bed mobility with Min guard; transfers with Min A and performing ADL functional mobility in room and in hallway with min guard. Pt tolerating session well with slight pain reported in abdomen with transitional movement. Pt performing toileting and hygiene in standing with Min guard. Overall ADLs, min A. Pt would benefit from continued OT skilled services in SNF setting for ADLs, mobility, safety and safety. Thank you for this referral.    Follow Up Recommendations  SNF    Equipment Recommendations       Recommendations for Other Services       Precautions / Restrictions Precautions Precautions: Fall Restrictions Weight Bearing Restrictions: No      Mobility Bed Mobility Overal bed mobility: Needs Assistance Bed Mobility: Supine to Sit;Sit to Supine     Supine to sit: Min assist Sit to supine: Min guard      Transfers Overall transfer level: Needs assistance Equipment used: Rolling walker (2 wheeled) Transfers: Sit to/from Stand Sit to Stand: Min guard;From elevated surface         General transfer comment: MinA for initial standing balance    Balance Overall balance assessment: Needs assistance Sitting-balance support: Single extremity supported;Feet supported Sitting balance-Leahy Scale: Fair      Standing balance support: Bilateral upper extremity supported;During functional activity Standing balance-Leahy Scale: Poor Standing balance comment: reliant on UE support                           ADL either performed or assessed with clinical judgement   ADL Overall ADL's : Needs assistance/impaired Eating/Feeding: Set up   Grooming: Wash/dry face;Oral care;Applying deodorant;Supervision/safety   Upper Body Bathing: Min guard;Cueing for safety   Lower Body Bathing: Minimal assistance;Sit to/from stand   Upper Body Dressing : Min guard   Lower Body Dressing: Minimal assistance   Toilet Transfer: Min guard   Toileting- Clothing Manipulation and Hygiene: Min guard       Functional mobility during ADLs: Supervision/safety;Rolling walker General ADL Comments: requires increased time      Vision Patient Visual Report: No change from baseline Vision Assessment?: No apparent visual deficits     Perception     Praxis      Pertinent Vitals/Pain Pain Assessment: Faces Faces Pain Scale: Hurts a little bit Pain Location: abdomen Pain Descriptors / Indicators: Aching;Discomfort;Guarding;Grimacing     Hand Dominance     Extremity/Trunk Assessment Upper Extremity Assessment Upper Extremity Assessment: Overall WFL for tasks assessed   Lower Extremity Assessment Lower Extremity Assessment: Defer to PT evaluation   Cervical / Trunk Assessment Cervical / Trunk Assessment: Normal   Communication Communication Communication: No difficulties   Cognition Arousal/Alertness: Awake/alert Behavior During Therapy: WFL for tasks assessed/performed Overall Cognitive Status: Within Functional Limits for tasks assessed  General Comments  O2 levels >93% on RA and HR remained stable 79-90 BPm with activity     Exercises     Shoulder Instructions      Home Living Family/patient expects to be discharged to::  Private residence Living Arrangements: Alone Available Help at Discharge: Family;Available PRN/intermittently Type of Home: Apartment Home Access: Level entry     Home Layout: One level     Bathroom Shower/Tub: Teacher, early years/pre: Standard     Home Equipment: Environmental consultant - 2 wheels;Cane - single point   Additional Comments: patient lives alone; family stating they are trying to provide 24/hr care, however has not been able to do so yet      Prior Functioning/Environment Level of Independence: Needs assistance  Gait / Transfers Assistance Needed: use of RW for household ambulation ADL's / Homemaking Assistance Needed: requires asssit from family for all ADLs, cooking, cleaning            OT Problem List: Decreased strength;Decreased activity tolerance;Impaired balance (sitting and/or standing);Pain      OT Treatment/Interventions: Self-care/ADL training;Therapeutic exercise;Therapeutic activities;Balance training    OT Goals(Current goals can be found in the care plan section) Acute Rehab OT Goals Patient Stated Goal: regain strength/mobility OT Goal Formulation: With patient Time For Goal Achievement: 10/03/18 Potential to Achieve Goals: Good  OT Frequency: Min 2X/week   Barriers to D/C: Decreased caregiver support  family able to check in, but may not be 24/7       Co-evaluation              AM-PAC OT "6 Clicks" Daily Activity     Outcome Measure Help from another person eating meals?: None Help from another person taking care of personal grooming?: A Little Help from another person toileting, which includes using toliet, bedpan, or urinal?: A Little Help from another person bathing (including washing, rinsing, drying)?: A Little Help from another person to put on and taking off regular upper body clothing?: A Little Help from another person to put on and taking off regular lower body clothing?: A Little 6 Click Score: 19   End of Session  Equipment Utilized During Treatment: Gait belt Nurse Communication: Mobility status  Activity Tolerance: Patient tolerated treatment well Patient left: in chair;with call bell/phone within reach;with chair alarm set  OT Visit Diagnosis: Unsteadiness on feet (R26.81);Muscle weakness (generalized) (M62.81)                Time: 9030-0923 OT Time Calculation (min): 31 min Charges:  OT General Charges $OT Visit: 1 Visit OT Evaluation $OT Eval Moderate Complexity: 1 Mod OT Treatments $Self Care/Home Management : 8-22 mins  Ebony Hail Harold Hedge) Marsa Aris OTR/L Acute Rehabilitation Services Pager: [phone] Office: [phone] Fredda Hammed 10/03/2018, 12:38 PM

## 2018-10-03 NOTE — Consult Note (Signed)
Referring Provider:  Family practice teaching service Primary Care Physician:  Nuala Alpha, DO Primary Gastroenterologist: None (has seen Dr. Paulita Fujita in 1 time in our office, about a year ago, but has not followed regularly)  Reason for Consultation: Choledocholithiasis  HPI: Hunter Carter is a 67 y.o. male with advanced liver disease due to hepatitis C and alcohol, recently status post hospitalization for acute cholecystitis, at which time a cholecystostomy tube was placed, rather than undergoing cholecystectomy, because of the patient's poor medical condition.    Apart from his advanced liver disease, he has coronary disease 15 years status post CABG with pacemaker placement because of second-degree AV block.  Manifestations of his liver disease include coagulopathy with current INR of 2.75 which is higher than it usually runs, severe thrombocytopenia (platelet count in the 30-50,000 range), hyperbilirubinemia (probable component of Gilbert's syndrome because of large unconjugated fraction), hepatic encephalopathy and generalized weakness, but apparently no past issue with ascites or GI bleeding, from my initial chart review.  Endoscopy 3 years ago did not show any esophageal varices.  Current CT is negative for ascites.  The history is obtained primarily from review of the record, conversation with the referring service, and discussion with the patient's daughter, Hunter Carter, at the bedside.   Following his recent hospitalization for the cholecystostomy placement, he was discharged to a skilled nursing facility for a few days for rehabilitation, until his insurance benefits ran out, then returned home to his own apartment where he lives alone, but with close family support.  He was there for 1 day before he developed abdominal pain which prompted admission here the day before yesterday.    CT on admission showed that a stone which had previously been present in his cystic duct had now migrated  into the distal common bile duct where there was mild dilatation to 9 mm.  Lipase normal.  Since admission, his abdominal pain is much improved, essentially resolved.    He has waxing and waning mental status and, at the time of my visit, the patient was very somnolent.  Although he was arousable with verbal stimulation, he would drift immediately back off to sleep so he was not really able to engage in conversation.   His serum ammonia level was elevated at 63 at time of clinical presentation; the patient had been on lactulose but this was stopped upon admission because of a history of diarrhea.  Evaluation for C. difficile is in progress.  He is being seen by palliative care and is felt most likely to be eligible for hospice before too long, primarily on account of his advanced liver disease.     Past Medical History:  Diagnosis Date  . Acute hepatic encephalopathy 10/10/2015   [name] 10/10/2015  . Arthritis    "joints ache" (10/10/2015)  . Bleeding stomach ulcer    "recently" (10/10/2015)  . CAD (coronary artery disease)   . CHF (congestive heart failure) ([place])   . Cirrhosis ([place])    due to HCV/notes 10/10/2015  . ETOH abuse   . GERD (gastroesophageal reflux disease)   . Heart block AV second degree    permanent pacemaker 02/2004  . Hepatitis C   . Hyperlipidemia   . Inferior MI ([place]) 2005  . Presence of permanent cardiac pacemaker   . RBBB   . Systemic hypertension   . Tubulovillous adenoma of colon   . Ventricular tachycardia (HCC)    nonsustained    Past Surgical History:  Procedure Laterality Date  .  CARDIAC CATHETERIZATION  03/06/04   significant 3 vessel disease, totalled graft obtuse marginal  . CORONARY ARTERY BYPASS GRAFT  02/10/03   LIMA to LAD,SVG to first diagonal, SVT to OM1  . ESOPHAGOGASTRODUODENOSCOPY N/A 07/29/2015   Procedure: ESOPHAGOGASTRODUODENOSCOPY (EGD);  Surgeon: Clarene Essex, MD;  Location: [facility] ENDOSCOPY;  Service: Endoscopy;  Laterality: N/A;  .  INSERT / REPLACE / REMOVE PACEMAKER  03/2010   Medtronic  . IR PARACENTESIS  08/30/2018  . IR PARACENTESIS  09/02/2018  . IR PERC CHOLECYSTOSTOMY  08/30/2018  . KNEE ARTHROSCOPY Left [phone]  . MEDIASTINAL EXPLORATION  02/10/2003  . NM MYOCAR PERF WALL MOTION  04/05/09   low risk scan-abnormal-mod. perfusion defect basal inferoseptal,basal inferior,mid inferoseptal,mid inferior and apical inferior regions  . TOTAL KNEE ARTHROPLASTY Left   . US ECHOCARDIOGRAPHY  01/20/07   mild MR,mild to mod. TR,trace AI,mild PI    Prior to Admission medications   Medication Sig Start Date End Date Taking? Authorizing Provider  escitalopram (LEXAPRO) 10 MG tablet Take 10 mg by mouth daily.   Yes [provider]  furosemide (LASIX) 40 MG tablet Take 40 mg by mouth.   Yes [provider]  lactulose (CHRONULAC) 10 GM/15ML solution Take 45 mLs (30 g total) by mouth 2 (two) times daily. Titrate for 3-4 BM's/ day Patient taking differently: Take 30 g by mouth 3 (three) times daily. Titrate for 3-4 BM's/ day 03/22/18  Yes Sheikh, Omair Latif, DO  losartan (COZAAR) 25 MG tablet Take 25 mg by mouth daily.   Yes [provider]  metoprolol tartrate (LOPRESSOR) 50 MG tablet TAKE 1 TABLET BY MOUTH TWO  TIMES DAILY Patient taking differently: Take 50 mg by mouth daily.  07/15/18  Yes Lockamy, Timothy, DO  omeprazole (PRILOSEC) 20 MG capsule TAKE 1 CAPSULE BY MOUTH  DAILY Patient taking differently: Take 20 mg by mouth daily.  07/15/18  Yes Lockamy, Timothy, DO  PROAIR HFA 108 (90 Base) MCG/ACT inhaler USE 2 PUFFS BY MOUTH EVERY  4 HOURS AS NEEDED FOR  WHEEZING OR SHORTNESS OF  BREATH (OR COUGHING) Patient taking differently: Inhale 1-2 puffs into the lungs every 4 (four) hours as needed for wheezing.  06/20/18  Yes Lockamy, Timothy, DO  simvastatin (ZOCOR) 20 MG tablet Take 20 mg by mouth daily.   Yes [provider]  spironolactone (ALDACTONE) 50 MG tablet TAKE 1 TABLET BY MOUTH EVERY  DAY 09/10/18  Yes Nuala Alpha, DO    Current Facility-Administered Medications  Medication Dose Route Frequency Provider Last Rate Last Dose  . 0.9 %  sodium chloride infusion   Intravenous Continuous Everrett Coombe, MD 125 mL/hr at 10/03/18 0600    . acetaminophen (TYLENOL) tablet 650 mg  650 mg Oral Q8H PRN Benay Pike, MD       Or  . acetaminophen (TYLENOL) suppository 650 mg  650 mg Rectal Q8H PRN Benay Pike, MD      . albuterol (PROVENTIL) (2.5 MG/3ML) 0.083% nebulizer solution 2.5 mg  2.5 mg Nebulization Q2H PRN Benay Pike, MD      . escitalopram (LEXAPRO) tablet 10 mg  10 mg Oral Daily Benay Pike, MD   10 mg at 10/03/18 0955  . feeding supplement (ENSURE ENLIVE) (ENSURE ENLIVE) liquid 237 mL  237 mL Oral BID BM Rancour, Stephen, MD   237 mL at 10/03/18 0958  . metoprolol tartrate (LOPRESSOR) tablet 25 mg  25 mg Oral BID Everrett Coombe, MD   25 mg at 10/03/18  [id number]  . ondansetron (ZOFRAN) tablet 4 mg  4 mg Oral Q6H PRN Benay Pike, MD       Or  . ondansetron [facility]) injection 4 mg  4 mg Intravenous Q6H PRN Benay Pike, MD      . pantoprazole (PROTONIX) EC tablet 40 mg  40 mg Oral Daily Benay Pike, MD   40 mg at 10/03/18 0955  . rifaximin (XIFAXAN) tablet 550 mg  550 mg Oral BID Higinio Plan, Samantha N, DO      . sodium chloride flush (NS) 0.9 % injection 3 mL  3 mL Intravenous Q12H Benay Pike, MD        Allergies as of 10/02/2018  . (No Known Allergies)    Family History  Problem Relation Age of Onset  . Heart failure Mother   . Liver disease Father   . Heart failure Sister   . Hypertension Sister   . Hypertension Sister   . Colon cancer Neg Hx     Social History   Socioeconomic History  . Marital status: Widowed    Spouse name: Not on file  . Number of children: Not on file  . Years of education: Not on file  . Highest education level: Not on file  Occupational History  . Not on file  Social Needs  . Financial resource strain: Not  on file  . Food insecurity:    Worry: Not on file    Inability: Not on file  . Transportation needs:    Medical: Not on file    Non-medical: Not on file  Tobacco Use  . Smoking status: Former Smoker    Packs/day: 0.25    Years: 41.00    Pack years: 10.25    Types: Cigarettes    Last attempt to quit: 11/15/2017    Years since quitting: 0.8  . Smokeless tobacco: Never Used  Substance and Sexual Activity  . Alcohol use: Yes    Comment: 10/10/2015 "last drink was 2-3 months ago; h/o abuse"  . Drug use: Yes    Types: Marijuana    Comment: last use 2015   . Sexual activity: Not Currently  Lifestyle  . Physical activity:    Days per week: Not on file    Minutes per session: Not on file  . Stress: Not on file  Relationships  . Social connections:    Talks on phone: Not on file    Gets together: Not on file    Attends religious service: Not on file    Active member of club or organization: Not on file    Attends meetings of clubs or organizations: Not on file    Relationship status: Not on file  . Intimate partner violence:    Fear of current or ex partner: Not on file    Emotionally abused: Not on file    Physically abused: Not on file    Forced sexual activity: Not on file  Other Topics Concern  . Not on file  Social History Narrative  . Not on file    Review of Systems: The patient has been using a walker to get around his apartment.  He gets winded on trips to the bathroom.  He is moving his bowels at least 3 or 4 times a day, sometimes more, in association with the lactulose.  Gets occasional sharp chest pains.  Has not been eating well for the past several weeks.  Physical Exam: Vital signs in last 24 hours:  Temp:  [97.4 F (36.3 C)-97.8 F (36.6 C)] 97.8 F (36.6 C) (12/20 0944) Pulse Rate:  [59-66] 59 (12/20 0944) Resp:  [14-16] 16 (12/20 0944) BP: (84-120)/(59-74) 95/60 (12/20 0944) SpO2:  [94 %-100 %] 96 % (12/20 0944) Last BM Date: (pta) General:    Somnolent, no acute distress, appears somewhat chronically ill but not cachectic Head:  Normocephalic and atraumatic. Eyes:  Sclera clear, no icterus.   Conjunctiva pink. Mouth:   No ulcerations or lesions.  Oropharynx pink & moist. Neck:   No masses or thyromegaly. Lungs:  Clear throughout to auscultation.   No wheezes, crackles, or rhonchi. No evident respiratory distress. Heart:   Regular rate and rhythm; no murmurs, clicks, rubs,  or gallops. Abdomen: Slightly distended, loud gurgling but nonobstructive bowel sounds, no hepatosplenomegaly appreciated, no evidence of ascites.  No mass-effect or significant tenderness except for some slight right upper quadrant tenderness.  Cholecystostomy drain present in right upper quadrant. Msk:   Symmetrical without gross deformities. Extremities:   Without clubbing, cyanosis, or edema. Neurologic: Minimal asterixis present.  Promptly tells me his date of birth, but is quite somnolent. Skin:  Intact without significant lesions or rashes. Cervical Nodes:  No significant cervical adenopathy. Psych:   Does not appear to be anxious or depressed.  Intake/Output from previous day: 12/19 0701 - 12/20 0700 In: 1528 [P.O.:278; I.V.:1250] Out: 830 [Urine:600; Drains:230] Intake/Output this shift: Total I/O In: 590 [P.O.:590] Out: 120 [Urine:120]  Lab Results: Recent Labs    10/02/18 0045 10/02/18 0620 10/03/18 0234  WBC 6.1 6.2 5.3  HGB 14.4 11.8* 11.8*  HCT 41.2 35.0* 33.7*  PLT 47* 34* 43*   BMET Recent Labs    10/02/18 0045 10/02/18 0754 10/03/18 0234  NA 125* 128* 128*  K 4.4 4.7 4.4  CL 91* 94* 98  CO2 25 26 23   GLUCOSE 106* 95 93  BUN 11 11 13   CREATININE 1.44* 1.28* 1.20  CALCIUM 8.3* 7.7* 7.9*   LFT Recent Labs    10/03/18 0234  PROT 6.4*  ALBUMIN 1.3*  AST 38  ALT 24  ALKPHOS 61  BILITOT 5.1*  BILIDIR 1.8*  IBILI 3.3*   PT/INR Recent Labs    10/02/18 0045  LABPROT 28.7*  INR 2.75    Studies/Results: Ct  Abdomen Pelvis Wo Contrast  Addendum Date: 10/02/2018   ADDENDUM REPORT: 10/02/2018 03:20 ADDENDUM: The current study can now be compared to the most recent CT from 09/01/2018. The gallstone now in the distal common bile duct was previously seen in the region of the cystic duct. There was no biliary dilatation at that time. The pericholecystic inflammatory changes have significantly improved. Previously seen small ascites and bilateral pleural effusions have resolved. Electronically Signed   By: Titus Dubin M.D.   On: 10/02/2018 03:20   Result Date: 10/02/2018 CLINICAL DATA:  Abdominal pain and weakness. History of cirrhosis and renal failure. EXAM: CT ABDOMEN AND PELVIS WITHOUT CONTRAST TECHNIQUE: Multidetector CT imaging of the abdomen and pelvis was performed following the standard protocol without IV contrast. COMPARISON:  CT abdomen pelvis report dated September 01, 2018. FINDINGS: Lower chest: No acute abnormality. Hepatobiliary: Cirrhotic liver. No focal liver abnormality. Percutaneous cholecystostomy tube in place. Multiple gallstones. The gallbladder is decompressed with moderate circumferential wall thickening. There is a 7 mm stone in the distal common bile duct. Mild common bile duct dilatation, measuring up to 9 mm. Pancreas: Unremarkable. No pancreatic ductal dilatation or surrounding inflammatory changes. Spleen: Normal in size without  focal abnormality. Adrenals/Urinary Tract: Adrenal glands are unremarkable. Kidneys are normal, without renal calculi, focal lesion, or hydronephrosis. Bladder is unremarkable. Stomach/Bowel: Stomach is within normal limits. Appendix appears normal. No evidence of bowel wall thickening, distention, or inflammatory changes. Vascular/Lymphatic: Large esophageal varices and recannulized paraumbilical vein. Aortic atherosclerosis with ectatic bilateral common iliac arteries. Prominent porta hepatis and peripancreatic lymph nodes. Reproductive: Unchanged  prostatomegaly. Other: No free fluid or pneumoperitoneum. Musculoskeletal: No acute or significant osseous findings. IMPRESSION: 1. Choledocholithiasis with mild common bile duct dilatation. Based on the prior CT abdomen pelvis report from 09/01/2018, this is a new finding. Images are not currently available due to PACS downtime. Recommend ERCP for further evaluation 2. Percutaneous cholecystostomy tube remains in place with unchanged cholelithiasis. The gallbladder is largely decompressed. Minimal residual pericholecystic inflammatory changes. 3. Cirrhosis with sequelae of portal hypertension including esophageal varices and recannulized paraumbilical vein. 4.  Aortic atherosclerosis (ICD10-I70.0). This exam was interpreted during a PACS downtime with limited availability of comparison cases. It has been flagged for review following the downtime. If clinically indicated after this review, an addendum will be issued providing details about comparison to prior imaging. Electronically Signed: By: Titus Dubin M.D. On: 10/02/2018 01:40   Dg Chest 2 View  Result Date: 10/02/2018 CLINICAL DATA:  Abdominal pain and weakness for a few days. [facility] hospital stay. EXAM: CHEST - 2 VIEW COMPARISON:  09/02/2018 FINDINGS: Postoperative changes in the mediastinum. Cardiac pacemaker. Shallow inspiration. Heart size and pulmonary vascularity are normal. Lungs are clear. No airspace disease or consolidation. No blunting of costophrenic angles. No pneumothorax. Mediastinal contours appear intact. Degenerative changes in the spine and shoulders. IMPRESSION: Shallow inspiration. No evidence of active pulmonary disease. Electronically Signed   By: Lucienne Capers M.D.   On: 10/02/2018 01:04    Impression: 1.  Choledocholithiasis 2.  No evidence of cholangitis (LFTs normal, no fever, no ongoing abdominal pain), which is not surprising since he has biliary decompression via his cholecystostomy  tube.  No evidence of gallstone pancreatitis (lipase normal, CT showing no evidence of pancreatitis). 3.  Advanced liver disease (coagulopathy, profound thrombocytopenia, hyperbilirubinemia albeit with element of Gilbert's syndrome, hepatic encephalopathy) 4.  General failure to thrive, probably multifactorial but probably due primarily to his liver disease  Plan: Lengthy conversation with the patient's daughter and son, Hunter Carter, at the bedside.  As noted above, the patient himself is not able to engage in conversation very effectively.  I think an ERCP for common duct stone removal in this patient would be more likely to do harm than good, given his tenuous medical status and limited life expectancy and the fairly high risk of doing the procedure in this patient with coagulopathy and marginal mental status.    The patient is somewhat protected from cholangitis development by virtue of his cholecystostomy, so the main risk to him, which is hypothetical, would be migration of the stone distally, leading to gallstone pancreatitis.   Under different circumstances, it would certainly be appropriate for stone removal but since the stone is not currently causing problems, I think it is best left alone.  We could certainly reconsider this decision if the patient has recurring episodes of pain.  I explained this to the family members and they seem to be in agreement.  I have also discussed this patient's management with Dr. Darrelyn Hillock of the family practice teaching service.  I do recommend initiation of rifaximin for treatment of the patient's presumed hepatic encephalopathy (  somnolence, elevated ammonia, asterixis).   LOS: 1 day   [place]  10/03/2018, 3:15 PM   Pager [phone] If no answer or after 5 PM call [phone]

## 2018-10-03 NOTE — Social Work (Signed)
Started auth with [facility] Medicare for admit to [facility] per family wishes discussed during PMT meeting.   Westley Hummer, MSW, [place] Work [phone]

## 2018-10-03 NOTE — NC FL2 (Signed)
[place] MEDICAID FL2 LEVEL OF CARE SCREENING TOOL     IDENTIFICATION  Patient Name: Hunter Carter Birthdate: 1950/11/01 Sex: male Admission Date (Current Location): 10/02/2018  [facility] and [place] Number:  Herbalist and Address:  [facility], [place] [address], [place], [place] [address]      Provider Number: [id number]  Attending Physician Name and Address:  No att. providers found  Relative Name and Phone Number:  Loraine Freid, daughter, [phone]    Current Level of Care: Hospital Recommended Level of Care: [place] Prior Approval Number:    Date Approved/Denied:   PASRR Number: [id number]  Discharge Plan: SNF    Current Diagnoses: Patient Active Problem List   Diagnosis Date Noted  . Malnutrition of moderate degree 10/03/2018  . Choledocholithiasis 10/02/2018  . Hypotension   . Dehydration   . Diarrhea   . Abdominal distension   . Sepsis due to Streptococcus species ([place])   . [place] II   . Hyperkalemia   . Severe sepsis ([place]) 08/30/2018  . Boil of buttock 08/20/2018  . Cholecystitis, acute 08/20/2018  . Abdominal pain 07/20/2018  . Acute hepatic encephalopathy 03/20/2018  . Gastroesophageal reflux disease 03/11/2018  . CAD of autologous vein bypass graft without angina 03/01/2018  . Healthcare maintenance 02/13/2018  . Skin lesion 02/13/2018  . Cirrhosis ([place]) 08/08/2016  . Paroxysmal atrial fibrillation ([place]) 01/05/2016  . Tobacco abuse 01/05/2016  . Hepatic encephalopathy ([place]) 10/10/2015  . Pain, joint, ankle, left 10/10/2015  . Thrombocytopathia ([place]) 10/10/2015  . HLD (hyperlipidemia) 10/10/2015  . Chronic diastolic congestive heart failure ([place]) 10/10/2015  . Altered mental status   . Increased ammonia level   . Leg swelling   . Encephalopathy acute 08/01/2015  . GI bleed 07/29/2015  . Acute GI bleeding 07/29/2015  . Acute respiratory failure ([place])   . Bleeding gastrointestinal    . Hemorrhagic shock ([place])   . CAD s/p CABG 2004 07/20/2013  . Pacemaker - dual chamber Medtronic 2011 07/20/2013  . Hepatitis C 07/20/2013  . Hyperlipidemia 07/20/2013  . PAT (paroxysmal atrial tachycardia) ([place]) 07/20/2013  . Obesity (BMI 30-39.9) 07/20/2013  . NSVT (nonsustained ventricular tachycardia) ([place]) 07/20/2013  . HTN (hypertension) 07/16/2013    Orientation RESPIRATION BLADDER Height & Weight     Self, Time, Situation, Place  Normal Continent Weight: 183 lb (83 kg) Height:  5\' 6"  (167.6 cm)  BEHAVIORAL SYMPTOMS/MOOD NEUROLOGICAL BOWEL NUTRITION STATUS      Continent Diet(see discharge summary)  AMBULATORY STATUS COMMUNICATION OF NEEDS Skin   Limited Assist Verbally Other (Comment)(biliary drain to gravity)                       Personal Care Assistance Level of Assistance  Bathing, Feeding, Dressing Bathing Assistance: Limited assistance Feeding assistance: Independent Dressing Assistance: Limited assistance     Functional Limitations Info  Sight, Hearing, Speech Sight Info: Adequate Hearing Info: Adequate Speech Info: Adequate    SPECIAL CARE FACTORS FREQUENCY  OT (By licensed OT), PT (By licensed PT)     PT Frequency: 5x week OT Frequency: 5x week            Contractures Contractures Info: Not present    Additional Factors Info  Code Status, Allergies, Isolation Precautions, Psychotropic Code Status Info: DNR Allergies Info: No Know Allergies Psychotropic Info: escitalopram (LEXAPRO) tablet 10 mg daily PO   Isolation Precautions Info: Enteric Precautions     Current Medications (10/03/2018):  This is the current hospital active medication list Current Facility-Administered Medications  Medication Dose Route Frequency Provider Last Rate Last Dose  . 0.9 %  sodium chloride infusion   Intravenous Continuous Everrett Coombe, MD 125 mL/hr at 10/03/18 0600    . acetaminophen (TYLENOL) tablet 650 mg  650 mg Oral Q8H PRN Benay Pike, MD        Or  . acetaminophen (TYLENOL) suppository 650 mg  650 mg Rectal Q8H PRN Benay Pike, MD      . albuterol (PROVENTIL) (2.5 MG/3ML) 0.083% nebulizer solution 2.5 mg  2.5 mg Nebulization Q2H PRN Benay Pike, MD      . escitalopram (LEXAPRO) tablet 10 mg  10 mg Oral Daily Benay Pike, MD   10 mg at 10/03/18 0955  . feeding supplement (ENSURE ENLIVE) (ENSURE ENLIVE) liquid 237 mL  237 mL Oral BID BM Rancour, Stephen, MD   237 mL at 10/03/18 0958  . metoprolol tartrate (LOPRESSOR) tablet 25 mg  25 mg Oral BID Everrett Coombe, MD   25 mg at 10/03/18 0955  . ondansetron (ZOFRAN) tablet 4 mg  4 mg Oral Q6H PRN Benay Pike, MD       Or  . ondansetron [facility]) injection 4 mg  4 mg Intravenous Q6H PRN Benay Pike, MD      . pantoprazole (PROTONIX) EC tablet 40 mg  40 mg Oral Daily Benay Pike, MD   40 mg at 10/03/18 0955  . sodium chloride flush (NS) 0.9 % injection 3 mL  3 mL Intravenous Q12H Benay Pike, MD         Discharge Medications: Please see discharge summary for a list of discharge medications.  Relevant Imaging Results:  Relevant Lab Results:   Additional Information [phone]; pt requesting palliative services at SNF  [place]

## 2018-10-03 NOTE — Progress Notes (Signed)
   10/03/18 1300  Clinical Encounter Type  Visited With Patient and family together  Visit Type Initial;Spiritual support;Psychological support  Referral From Nurse;Patient  Spiritual Encounters  Spiritual Needs Prayer;Emotional  Stress Factors  Patient Stress Factors Exhausted;Health changes  Family Stress Factors Loss of control;Major life changes   Responded to Epic consult for pt requesting prayer.  Met w/ pt, dau, dau in law, and son in room.  Empathetic conversation w/ each, prayed for pt at bedside w/ family gathered around.  Let them all know that spiritual care support is available for add'l support and to request via nursing staff.  Myra Gianotti resident, [phone]

## 2018-10-04 DIAGNOSIS — E871 Hypo-osmolality and hyponatremia: Secondary | ICD-10-CM

## 2018-10-04 LAB — CBC WITH DIFFERENTIAL/PLATELET
Abs Immature Granulocytes: 0.01 10*3/uL (ref 0.00–0.07)
Basophils Absolute: 0.1 10*3/uL (ref 0.0–0.1)
Basophils Relative: 1 %
Eosinophils Absolute: 0.2 10*3/uL (ref 0.0–0.5)
Eosinophils Relative: 4 %
HCT: 35.8 % — ABNORMAL LOW (ref 39.0–52.0)
Hemoglobin: 11.9 g/dL — ABNORMAL LOW (ref 13.0–17.0)
Immature Granulocytes: 0 %
Lymphocytes Relative: 39 %
Lymphs Abs: 2 10*3/uL (ref 0.7–4.0)
MCH: 32.4 pg (ref 26.0–34.0)
MCHC: 33.2 g/dL (ref 30.0–36.0)
MCV: 97.5 fL (ref 80.0–100.0)
Monocytes Absolute: 1.2 10*3/uL — ABNORMAL HIGH (ref 0.1–1.0)
Monocytes Relative: 23 %
NEUTROS PCT: 33 %
Neutro Abs: 1.8 10*3/uL (ref 1.7–7.7)
Platelets: 39 10*3/uL — ABNORMAL LOW (ref 150–400)
RBC: 3.67 MIL/uL — ABNORMAL LOW (ref 4.22–5.81)
RDW: 15.8 % — ABNORMAL HIGH (ref 11.5–15.5)
WBC: 5.3 10*3/uL (ref 4.0–10.5)
nRBC: 0.4 % — ABNORMAL HIGH (ref 0.0–0.2)

## 2018-10-04 LAB — COMPREHENSIVE METABOLIC PANEL
ALBUMIN: 1.4 g/dL — AB (ref 3.5–5.0)
ALT: 25 U/L (ref 0–44)
AST: 39 U/L (ref 15–41)
Alkaline Phosphatase: 74 U/L (ref 38–126)
Anion gap: 5 (ref 5–15)
BUN: 10 mg/dL (ref 8–23)
CALCIUM: 7.9 mg/dL — AB (ref 8.9–10.3)
CO2: 25 mmol/L (ref 22–32)
Chloride: 102 mmol/L (ref 98–111)
Creatinine, Ser: 0.96 mg/dL (ref 0.61–1.24)
GFR calc Af Amer: >60 mL/min (ref >60)
GFR calc non Af Amer: >60 mL/min (ref >60)
Glucose, Bld: 84 mg/dL (ref 70–99)
Potassium: 4.8 mmol/L (ref 3.5–5.1)
Sodium: 132 mmol/L — ABNORMAL LOW (ref 135–145)
Total Bilirubin: 4.2 mg/dL — ABNORMAL HIGH (ref 0.3–1.2)
Total Protein: 6.7 g/dL (ref 6.5–8.1)

## 2018-10-04 LAB — MAGNESIUM: Magnesium: 1.5 mg/dL — ABNORMAL LOW (ref 1.7–2.4)

## 2018-10-04 MED ORDER — MAGNESIUM SULFATE 2 GM/50ML IV SOLN
2.0000 g | Freq: Once | INTRAVENOUS | Status: AC
  Administered 2018-10-04: 2 g via INTRAVENOUS
  Filled 2018-10-04: qty 50

## 2018-10-04 NOTE — Progress Notes (Signed)
Family Medicine Teaching Service Daily Progress Note Intern Pager: [phone]  Patient name: Hunter Carter Medical record number: [id number] Date of birth: 1951-08-17 Age: 67 y.o. Gender: male  Primary Care Provider: Nuala Alpha, DO Consultants: None Code Status: DNR  Pt Overview and Major Events to Date:  12/19 - admit to [facility] for abdominal pain   Assessment and Plan: Hunter Carter is a 67 y.o. male with ESLD presenting with abdominal pain and weakness concerning for choledocholithiasis.  PMH is significant for paroxysmal a-fib, cirrhosis 2/2 alcohol use and hepatitis C, CHF, HTN, hyponatremia, depression.   Acute on RUQ Abdominal pain, in settting of choledocholithiasis Pain well controlled this a.m.  Known common bile duct stone.  Has cholecystostomy tube.  Given on going to also palliative care, and chronic medical conditions ERCP was not felt to be a good option for patient.  Plan is to DC to SNF with outpatient palliative. -Continue cholecystostomy tube -Zofran PRN for nausea -DC to SNF with outpatient palliative follow-up  Diarrhea and generalized weakness/fatigue Broad differential and likely multifactorial.  Use lactulose at home.  Negative C. difficile, very unlikely to be infectious.  No fevers.  Possibly due to rifaximin although symptoms started prior to starting this medication.  Likely secondary to fat malabsorption due to bile tree blockage.  - Continue rifaximin 550 mg twice daily - Holding home lactulose -PT OT-> recommending SNF  Hypotension in setting of chronic hypertension Systolics high 14N, map 65.  Decrease normal saline to 75 mL/h.  Heart rate in low 50s this a.m. -Normal saline at 75 mL/h -Holding furosemide, lactulose, spironolactone, losartan -Holding home metoprolol  Hyponatremia: Chronic, stable.  NA 132 this a.m.  Baseline around 130-133.  Chronically in setting of liver cirrhosis.  - IVF with NS 75cc/hr  -Monitor BMP  ESLD: Chronic.   Via previous significant history of alcohol use and hepatitis C infection that was treated.  -Holding all home meds in setting of hypotension-monitor mental status -Consulted palliative, appreciate further recommendations  Paroxysmal atrial tachycardia and atrial fibrillation w/ pacemaker : Stable.  RRR on exam.  Not anticoagulated due to cirrhosis.  HR in low 50s this a.m. - Cardiac monitoring - Holding metoprolol - DC telemetry  AKI Creatinine 0.96.  AKI resolved  Dysuria: Resolved. Not endorsing any dysuria this morning.  Urine culture with multiple species present.   - monitor for symptomatology  Depression: Stable.  - Continue home Lexapro  Thrombocytopenia Platelet 39.  Also secondary to end-stage liver disease.  FEN/GI: 75 ml/hr NS. Heart healthy diet  Prophylaxis: SCD, pts 43   Subjective: Doing well this a.m.  No acute acute distress.  No complaints. no questions.  Pain doing well, diarrhea resolved.  Objective: Temp:  [97.5 F (36.4 C)-98.2 F (36.8 C)] 97.5 F (36.4 C) (12/21 0533) Pulse Rate:  [59-64] 59 (12/21 0849) Resp:  [15-16] 15 (12/21 0533) BP: (95-99)/(61-65) 96/63 (12/21 0849) SpO2:  [98 %-100 %] 99 % (12/21 0533) Physical Exam: General: 67 year old African-American male, resting comfortably in bed.  No acute distress Cardiac: Regular rate and rhythm, no MRG.  Peripheral pulses Lungs: Clear to auscultation bilaterally, no increased work of breathing, no accessory muscle use Abdomen: soft, mildly tender to right upper quadrant, non-distended, normoactive BS, cholecystostomy tube in place with serosanguineous drainage. Msk: Moves all extremities spontaneously  Ext: Warm, dry, 2+ distal pulses, no edema   Laboratory: Recent Labs  Lab 10/02/18 0620 10/03/18 0234 10/04/18 0419  WBC 6.2 5.3 5.3  HGB 11.8* 11.8* 11.9*  HCT 35.0* 33.7* 35.8*  PLT 34* 43* 39*   Recent Labs  Lab 10/02/18 0754 10/03/18 0234 10/04/18 0419  NA 128* 128* 132*   K 4.7 4.4 4.8  CL 94* 98 102  CO2 26 23 25   BUN 11 13 10   CREATININE 1.28* 1.20 0.96  CALCIUM 7.7* 7.9* 7.9*  PROT 6.8 6.4* 6.7  BILITOT 4.5* 5.1* 4.2*  ALKPHOS 66 61 74  ALT 25 24 25   AST 37 38 39  GLUCOSE 95 93 84    Imaging/Diagnostic Tests: No results found.   Guadalupe Dawn, MD 10/04/2018, 10:40 AM PGY-2, [place] Intern pager: [phone], text pages welcome

## 2018-10-04 NOTE — Progress Notes (Signed)
CCMD called regarding telemetry being discontinued per order after 24 hours however an actual order is needed to remove from their computer as well as the level of care need to change from medical telemetry to med surg.  On call has been notified and will follow up.

## 2018-10-04 NOTE — Progress Notes (Signed)
I had a lengthy conversation yesterday afternoon with Roosevelt Locks, PA-C at the [facility] [phone]).  She has followed the patient closely for a long period of time and knows him well.    The patient has had previous treatment for hepatitis C but it was unsuccessful in eradicating his virus.  Such treatment failures are relatively uncommon, but unfortunately he is not felt to be a candidate for further treatment of his hepatitis C and, realistically, at this point is not felt to be a candidate for liver transplantation.  Today, the patient is quite a bit more alert.  He is oriented to month, date, and year, is much less somnolent, and seems quite coherent.  I discussed with him the same issues that I discussed with his children yesterday, namely, that he has a common bile duct stone that could give him pancreatitis, although I feel that is relatively unlikely in his natural expected remaining lifetime.  He would be at little risk for cholangitis since he has biliary drainage from his cholecystostomy.  The bile duct stone could be removed by ERCP, albeit at some risk of procedural complications, given his tenuous medical status.  Concordant with my own feelings and recommendations, and those of his children, the patient told me that he does not want to have an ERCP for stone extraction.  At this point, the patient's management will probably consist primarily of managing the complications of his liver disease, most notably, hepatic encephalopathy and progressive weakness.  Given his coagulopathy and hyperbilirubinemia, hepatocellular failure is probably going to be an impending issue as well.  He is now on rifaximin, which, given his deconditioned status, is probably a more realistic treatment for him than lactulose, the latter of which gave him significant problems with diarrhea.  Given that he has liver follow-up available at the [facility] liver clinic, and given that he does not need an  intervention such as ERCP during this hospitalization, I will sign off, but would certainly be happy to hear back from you if you have questions about his case, would like to discuss things further, or if there is any way that you would like [place] to assist further in his care.  Cleotis Nipper, M.D. Pager [phone] If no answer or after 5 PM call [phone]

## 2018-10-04 NOTE — Progress Notes (Signed)
Pt. Began having muscle spasms in his right arm; Teach service called and I spoke with Dr. Higinio Plan who said she would review his chart and get back with me.

## 2018-10-05 LAB — CBC
HCT: 32 % — ABNORMAL LOW (ref 39.0–52.0)
Hemoglobin: 11 g/dL — ABNORMAL LOW (ref 13.0–17.0)
MCH: 33.6 pg (ref 26.0–34.0)
MCHC: 34.4 g/dL (ref 30.0–36.0)
MCV: 97.9 fL (ref 80.0–100.0)
Platelets: 36 10*3/uL — ABNORMAL LOW (ref 150–400)
RBC: 3.27 MIL/uL — ABNORMAL LOW (ref 4.22–5.81)
RDW: 15.9 % — ABNORMAL HIGH (ref 11.5–15.5)
WBC: 5.6 10*3/uL (ref 4.0–10.5)
nRBC: 0.4 % — ABNORMAL HIGH (ref 0.0–0.2)

## 2018-10-05 LAB — COMPREHENSIVE METABOLIC PANEL
ALBUMIN: 1.3 g/dL — AB (ref 3.5–5.0)
ALT: 26 U/L (ref 0–44)
AST: 39 U/L (ref 15–41)
Alkaline Phosphatase: 85 U/L (ref 38–126)
Anion gap: 5 (ref 5–15)
BUN: 8 mg/dL (ref 8–23)
CALCIUM: 7.7 mg/dL — AB (ref 8.9–10.3)
CO2: 24 mmol/L (ref 22–32)
Chloride: 103 mmol/L (ref 98–111)
Creatinine, Ser: 0.88 mg/dL (ref 0.61–1.24)
GFR calc Af Amer: >60 mL/min (ref >60)
GFR calc non Af Amer: >60 mL/min (ref >60)
Glucose, Bld: 105 mg/dL — ABNORMAL HIGH (ref 70–99)
Potassium: 4.6 mmol/L (ref 3.5–5.1)
Sodium: 132 mmol/L — ABNORMAL LOW (ref 135–145)
Total Bilirubin: 3.9 mg/dL — ABNORMAL HIGH (ref 0.3–1.2)
Total Protein: 6.3 g/dL — ABNORMAL LOW (ref 6.5–8.1)

## 2018-10-05 LAB — MAGNESIUM: Magnesium: 1.7 mg/dL (ref 1.7–2.4)

## 2018-10-05 NOTE — Progress Notes (Signed)
Palliative:  Check in with RN - no concerns - patient alert, comfortable, sitting on side of bed. Plan remains for patient to go to SNF rehab with palliative and transition to hospice when appropriate. Patient is hospice appropriate now; but cannot receive hospice services and rehab.   Please call if further assistance is needed from PMT.  Juel Burrow, DNP, AGNP-C Palliative Medicine Team Team Phone # [phone]  Pager # [phone]  NO CHARGE

## 2018-10-05 NOTE — Progress Notes (Signed)
Family Medicine Teaching Service Daily Progress Note Intern Pager: [phone]  Patient name: Hunter Carter Medical record number: [id number] Date of birth: 1951-08-02 Age: 67 y.o. Gender: male  Primary Care Provider: Nuala Alpha, DO Consultants: None Code Status: DNR  Pt Overview and Major Events to Date:  12/19 - admit to [place] for abdominal pain  12/21- patient is medically ready for snf placement  Assessment and Plan: Hunter Carter is a 67 y.o. male with ESLD presenting with abdominal pain and weakness concerning for choledocholithiasis.  PMH is significant for paroxysmal a-fib, cirrhosis 2/2 alcohol use and hepatitis C, CHF, HTN, hyponatremia, depression.   Abdominal pain, in settting of choledocholithiasis (resolved) Pain well controlled. No pain this am. Known common bile duct stone. Has cholecystostomy tube. Candidate for snf placement with palliative follow up as outpatient. Not a candidate for ERCP and patient does not want. Patient is medically ready for snf placement. GI has signed off. - Continue cholecystostomy tube - Zofran PRN for nausea - DC to SNF with outpatient palliative follow-up - follow up with [place] [facility] clinic  Diarrhea and generalized weakness/fatigue Broad differential and likely multifactorial.  Use lactulose at home.  Negative C. difficile, very unlikely to be infectious.  No fevers.  Possibly due to rifaximin although symptoms started prior to starting this medication.  Likely secondary to fat malabsorption due to bile tree blockage.  - Continue rifaximin 550 mg twice daily - Holding home lactulose -PT OT-> recommending SNF  Hypotension in setting of chronic hypertension Systolics 810, diastolic 65.  Decrease normal saline to 75 mL/h.  Heart rate in low 60s this a.m. -decrease NS to KVO -Holding furosemide, lactulose, spironolactone, losartan -Holding home metoprolol  Hyponatremia: Chronic, stable.  NA 132 this a.m.  Baseline around  130-133.  Chronically in setting of liver cirrhosis.  Hunter Carter IVF -Monitor BMP  ESLD: Chronic.  Via previous significant history of alcohol use and hepatitis C infection that was treated.  -Holding all home meds in setting of hypotension-monitor mental status -Consulted palliative, appreciate further recommendations  Paroxysmal atrial tachycardia and atrial fibrillation w/ pacemaker : Stable.  RRR on exam.  Not anticoagulated due to cirrhosis.  HR in low 50s this a.m. - Cardiac monitoring - Holding metoprolol - DC telemetry  AKI Creatinine 0.88.  AKI resolved  Dysuria: Resolved. Not endorsing any dysuria this morning.  Urine culture with multiple species present.   - monitor for symptomatology  Depression: Stable.  - Continue home Lexapro  Thrombocytopenia Platelet 36.  Also secondary to end-stage liver disease.  FEN/GI: 75 ml/hr NS. Heart healthy diet  Prophylaxis: SCD, pts 43   Subjective: Doing well this am. No complaints. Ready for snf placement.  Objective: Temp:  [97.7 F (36.5 C)-98.3 F (36.8 C)] 98.3 F (36.8 C) (12/22 0600) Pulse Rate:  [60-61] 61 (12/22 0600) Resp:  [17-18] 18 (12/22 0600) BP: (95-108)/(57-75) 106/65 (12/22 0600) SpO2:  [97 %-100 %] 100 % (12/22 0600) Physical Exam: General: 67 year old AA male resting comfortably in bed. Cardiac: rrr, no murmurs/rubs/gallops.  Palpable peripheral pulses Lungs: CTA bilaterally, no increased WOB, no accessory muscle use Abdomen: soft, mildly tender to right upper quadrant, non-distended, normoactive BS, cholecystostomy tube in place with serosanguineous drainage. Msk: Moves all extremities spontaneously  Ext: Warm, dry, 2+ distal pulses, no edema   Laboratory: Recent Labs  Lab 10/03/18 0234 10/04/18 0419 10/05/18 0233  WBC 5.3 5.3 5.6  HGB 11.8* 11.9* 11.0*  HCT 33.7* 35.8* 32.0*  PLT 43* 39* 36*  Recent Labs  Lab 10/03/18 0234 10/04/18 0419 10/05/18 0233  NA 128* 132* 132*  K 4.4 4.8 4.6   CL 98 102 103  CO2 23 25 24   BUN 13 10 8   CREATININE 1.20 0.96 0.88  CALCIUM 7.9* 7.9* 7.7*  PROT 6.4* 6.7 6.3*  BILITOT 5.1* 4.2* 3.9*  ALKPHOS 61 74 85  ALT 24 25 26   AST 38 39 39  GLUCOSE 93 84 105*    Imaging/Diagnostic Tests: No results found.   Guadalupe Dawn, MD 10/05/2018, 10:57 AM PGY-2, [place] Intern pager: [phone], text pages welcome

## 2018-10-06 DIAGNOSIS — I10 Essential (primary) hypertension: Secondary | ICD-10-CM | POA: Diagnosis not present

## 2018-10-06 DIAGNOSIS — F419 Anxiety disorder, unspecified: Secondary | ICD-10-CM | POA: Diagnosis present

## 2018-10-06 DIAGNOSIS — I110 Hypertensive heart disease with heart failure: Secondary | ICD-10-CM | POA: Diagnosis not present

## 2018-10-06 DIAGNOSIS — I959 Hypotension, unspecified: Secondary | ICD-10-CM | POA: Diagnosis not present

## 2018-10-06 DIAGNOSIS — R4182 Altered mental status, unspecified: Secondary | ICD-10-CM | POA: Diagnosis not present

## 2018-10-06 DIAGNOSIS — R680 Hypothermia, not associated with low environmental temperature: Secondary | ICD-10-CM | POA: Diagnosis not present

## 2018-10-06 DIAGNOSIS — R000 Tachycardia, unspecified: Secondary | ICD-10-CM | POA: Diagnosis not present

## 2018-10-06 DIAGNOSIS — E860 Dehydration: Secondary | ICD-10-CM | POA: Diagnosis not present

## 2018-10-06 DIAGNOSIS — L98491 Non-pressure chronic ulcer of skin of other sites limited to breakdown of skin: Secondary | ICD-10-CM | POA: Diagnosis not present

## 2018-10-06 DIAGNOSIS — N170 Acute kidney failure with tubular necrosis: Secondary | ICD-10-CM | POA: Diagnosis not present

## 2018-10-06 DIAGNOSIS — R031 Nonspecific low blood-pressure reading: Secondary | ICD-10-CM | POA: Diagnosis not present

## 2018-10-06 DIAGNOSIS — R251 Tremor, unspecified: Secondary | ICD-10-CM | POA: Diagnosis not present

## 2018-10-06 DIAGNOSIS — S37009S Unspecified injury of unspecified kidney, sequela: Secondary | ICD-10-CM | POA: Diagnosis not present

## 2018-10-06 DIAGNOSIS — I441 Atrioventricular block, second degree: Secondary | ICD-10-CM | POA: Diagnosis not present

## 2018-10-06 DIAGNOSIS — E871 Hypo-osmolality and hyponatremia: Secondary | ICD-10-CM | POA: Diagnosis not present

## 2018-10-06 DIAGNOSIS — K7040 Alcoholic hepatic failure without coma: Secondary | ICD-10-CM | POA: Diagnosis present

## 2018-10-06 DIAGNOSIS — K8050 Calculus of bile duct without cholangitis or cholecystitis without obstruction: Secondary | ICD-10-CM | POA: Diagnosis not present

## 2018-10-06 DIAGNOSIS — Z66 Do not resuscitate: Secondary | ICD-10-CM | POA: Diagnosis not present

## 2018-10-06 DIAGNOSIS — Z7401 Bed confinement status: Secondary | ICD-10-CM | POA: Diagnosis not present

## 2018-10-06 DIAGNOSIS — R5383 Other fatigue: Secondary | ICD-10-CM | POA: Diagnosis not present

## 2018-10-06 DIAGNOSIS — R1011 Right upper quadrant pain: Secondary | ICD-10-CM | POA: Diagnosis not present

## 2018-10-06 DIAGNOSIS — I2510 Atherosclerotic heart disease of native coronary artery without angina pectoris: Secondary | ICD-10-CM | POA: Diagnosis not present

## 2018-10-06 DIAGNOSIS — K219 Gastro-esophageal reflux disease without esophagitis: Secondary | ICD-10-CM | POA: Diagnosis not present

## 2018-10-06 DIAGNOSIS — B1920 Unspecified viral hepatitis C without hepatic coma: Secondary | ICD-10-CM | POA: Diagnosis present

## 2018-10-06 DIAGNOSIS — Z7951 Long term (current) use of inhaled steroids: Secondary | ICD-10-CM | POA: Diagnosis not present

## 2018-10-06 DIAGNOSIS — I480 Paroxysmal atrial fibrillation: Secondary | ICD-10-CM | POA: Diagnosis not present

## 2018-10-06 DIAGNOSIS — N179 Acute kidney failure, unspecified: Secondary | ICD-10-CM | POA: Diagnosis not present

## 2018-10-06 DIAGNOSIS — R197 Diarrhea, unspecified: Secondary | ICD-10-CM | POA: Diagnosis not present

## 2018-10-06 DIAGNOSIS — K7290 Hepatic failure, unspecified without coma: Secondary | ICD-10-CM | POA: Diagnosis not present

## 2018-10-06 DIAGNOSIS — I509 Heart failure, unspecified: Secondary | ICD-10-CM | POA: Diagnosis not present

## 2018-10-06 DIAGNOSIS — R5381 Other malaise: Secondary | ICD-10-CM | POA: Diagnosis not present

## 2018-10-06 DIAGNOSIS — R404 Transient alteration of awareness: Secondary | ICD-10-CM | POA: Diagnosis not present

## 2018-10-06 DIAGNOSIS — Z87891 Personal history of nicotine dependence: Secondary | ICD-10-CM | POA: Diagnosis not present

## 2018-10-06 DIAGNOSIS — I4891 Unspecified atrial fibrillation: Secondary | ICD-10-CM | POA: Diagnosis not present

## 2018-10-06 DIAGNOSIS — Z950 Presence of cardiac pacemaker: Secondary | ICD-10-CM | POA: Diagnosis not present

## 2018-10-06 DIAGNOSIS — E785 Hyperlipidemia, unspecified: Secondary | ICD-10-CM | POA: Diagnosis not present

## 2018-10-06 DIAGNOSIS — A419 Sepsis, unspecified organism: Secondary | ICD-10-CM | POA: Diagnosis not present

## 2018-10-06 DIAGNOSIS — R571 Hypovolemic shock: Secondary | ICD-10-CM | POA: Diagnosis not present

## 2018-10-06 DIAGNOSIS — E875 Hyperkalemia: Secondary | ICD-10-CM | POA: Diagnosis not present

## 2018-10-06 DIAGNOSIS — M2550 Pain in unspecified joint: Secondary | ICD-10-CM | POA: Diagnosis not present

## 2018-10-06 DIAGNOSIS — D696 Thrombocytopenia, unspecified: Secondary | ICD-10-CM | POA: Diagnosis not present

## 2018-10-06 DIAGNOSIS — E872 Acidosis: Secondary | ICD-10-CM | POA: Diagnosis not present

## 2018-10-06 DIAGNOSIS — Z515 Encounter for palliative care: Secondary | ICD-10-CM | POA: Diagnosis not present

## 2018-10-06 DIAGNOSIS — R112 Nausea with vomiting, unspecified: Secondary | ICD-10-CM | POA: Diagnosis not present

## 2018-10-06 DIAGNOSIS — R52 Pain, unspecified: Secondary | ICD-10-CM | POA: Diagnosis not present

## 2018-10-06 DIAGNOSIS — K567 Ileus, unspecified: Secondary | ICD-10-CM | POA: Diagnosis not present

## 2018-10-06 DIAGNOSIS — I5032 Chronic diastolic (congestive) heart failure: Secondary | ICD-10-CM | POA: Diagnosis not present

## 2018-10-06 DIAGNOSIS — Z79899 Other long term (current) drug therapy: Secondary | ICD-10-CM | POA: Diagnosis not present

## 2018-10-06 DIAGNOSIS — R17 Unspecified jaundice: Secondary | ICD-10-CM | POA: Diagnosis not present

## 2018-10-06 DIAGNOSIS — R6521 Severe sepsis with septic shock: Secondary | ICD-10-CM | POA: Diagnosis not present

## 2018-10-06 DIAGNOSIS — R109 Unspecified abdominal pain: Secondary | ICD-10-CM | POA: Diagnosis not present

## 2018-10-06 DIAGNOSIS — Z951 Presence of aortocoronary bypass graft: Secondary | ICD-10-CM | POA: Diagnosis not present

## 2018-10-06 DIAGNOSIS — K769 Liver disease, unspecified: Secondary | ICD-10-CM | POA: Diagnosis not present

## 2018-10-06 DIAGNOSIS — R0902 Hypoxemia: Secondary | ICD-10-CM | POA: Diagnosis not present

## 2018-10-06 DIAGNOSIS — R300 Dysuria: Secondary | ICD-10-CM | POA: Diagnosis not present

## 2018-10-06 LAB — COMPREHENSIVE METABOLIC PANEL
ALK PHOS: 80 U/L (ref 38–126)
ALT: 24 U/L (ref 0–44)
AST: 38 U/L (ref 15–41)
Albumin: 1.3 g/dL — ABNORMAL LOW (ref 3.5–5.0)
Anion gap: 8 (ref 5–15)
BUN: 9 mg/dL (ref 8–23)
CALCIUM: 7.6 mg/dL — AB (ref 8.9–10.3)
CO2: 24 mmol/L (ref 22–32)
Chloride: 101 mmol/L (ref 98–111)
Creatinine, Ser: 0.92 mg/dL (ref 0.61–1.24)
GFR calc Af Amer: >60 mL/min (ref >60)
Glucose, Bld: 110 mg/dL — ABNORMAL HIGH (ref 70–99)
Potassium: 4.9 mmol/L (ref 3.5–5.1)
Sodium: 133 mmol/L — ABNORMAL LOW (ref 135–145)
Total Bilirubin: 5 mg/dL — ABNORMAL HIGH (ref 0.3–1.2)
Total Protein: 6 g/dL — ABNORMAL LOW (ref 6.5–8.1)

## 2018-10-06 LAB — CBC
HCT: 30.2 % — ABNORMAL LOW (ref 39.0–52.0)
Hemoglobin: 10.3 g/dL — ABNORMAL LOW (ref 13.0–17.0)
MCH: 33.3 pg (ref 26.0–34.0)
MCHC: 34.1 g/dL (ref 30.0–36.0)
MCV: 97.7 fL (ref 80.0–100.0)
Platelets: 31 10*3/uL — ABNORMAL LOW (ref 150–400)
RBC: 3.09 MIL/uL — ABNORMAL LOW (ref 4.22–5.81)
RDW: 15.9 % — ABNORMAL HIGH (ref 11.5–15.5)
WBC: 11.4 10*3/uL — AB (ref 4.0–10.5)
nRBC: 0.2 % (ref 0.0–0.2)

## 2018-10-06 MED ORDER — ADULT MULTIVITAMIN W/MINERALS CH
1.0000 | ORAL_TABLET | Freq: Every day | ORAL | Status: DC
  Administered 2018-10-06: 1 via ORAL
  Filled 2018-10-06: qty 1

## 2018-10-06 MED ORDER — METOPROLOL TARTRATE 25 MG PO TABS: 25.0000 mg | ORAL_TABLET | Freq: Two times a day (BID) | ORAL | 0 refills | Status: DC

## 2018-10-06 MED ORDER — RIFAXIMIN 550 MG PO TABS: 550.0000 mg | ORAL_TABLET | Freq: Two times a day (BID) | ORAL | 0 refills | Status: DC

## 2018-10-06 NOTE — Discharge Summary (Signed)
[facility] Discharge Summary  Patient name: Hunter Carter Medical record number: [id number] Date of birth: 22-Dec-1950 Age: 67 y.o. Gender: male Date of Admission: 10/02/2018  Date of Discharge: 10/06/18 Admitting Physician: Alveda Reasons, MD  Primary Care Provider: Nuala Alpha, DO Consultants: GI  Indication for Hospitalization:  Choledocholithiasis  Discharge Diagnoses/Problem List:  Choledocholithiasis Abdominal pain Diarrhea generalized weakness Hypertension Hyponatremia End-stage liver disease Atrial fibrillation Acute kidney injury Dysuria Depression Thrombocytopenia  Disposition: SNF  Discharge Condition: stable, improved  Discharge Exam:  General: 67 year old African-American male, resting comfortably in bed.  No acute distress.   Cardiac: RRR no MRG.  Peripheral pulses intact.  Lungs: Clear to auscultation bilaterally, no increased work of breathing, no accessory muscle use Abdomen: soft, mildly tender to right upper quadrant, non-distended, normoactive BS, cholecystostomy tube in place with serosanguineous drainage.  Msk: Moves all extremities spontaneously  Ext: Warm, dry, 2+ distal pulses, no edema   Brief Hospital Course:  67 year old male who presented 10/02/2018 due to bloating, nausea, diarrhea, and abdominal pain.  CT scan was performed which showed a choledocholithiasis.  Of note patient was recently admitted in late November and had a cholecystostomy tube placed by IR secondary to acute cholecystitis.    Choledocholithiasis Gastroenterology was consulted.  Due to patient's chronic medical issues he was not felt to be very good candidate for ERCP.  Given that he already had a cholecystostomy tube placed his risk of cholangitis felt to be very low.  Patient was started on rifaximin as he was displaying some evidence of hepatic encephalopathy.  Palliative care was consulted due to patient's chronic medical problems.   Patient has not felt to qualify for hospice care given his poor prognosis, but it was felt this family cannot care for him at home.  PT/OT recommended SNF placement and he was discharged to SNF on 10/06/18.    Abdominal pain Very minimal pain after placement of cholecystostomy tube last admission.  His pain is managed with Tylenol.  Less than 2 g/day given his end-stage liver disease. On day of discharge, denies any discomfort.   End-stage liver disease/hepatic encephalopathy Secondary to chronic alcohol use and hepatitis C infection.  Hepatitis C was treated.  Patient was intermittently confused on admission but symptoms resolved after starting rifaximin.  He does take home lactulose but came in with signs of dehydration secondary to diarrhea.  Lactulose was held during admission.  Paroxysmal A. fib with pacemaker Heart rate in low 50s throughout admission.  Metoprolol was held during admission and dose was restarted at half his home dose.  To be followed up at outpatient appointment and may consider increasing if appropriate.   Issues for Follow Up:  1. Ensure follow up with palliative care as outpatient.  2. Follow up heart rate, have discharged with Metoprolol 25 mg BID due to HR 60.  May consider increasing if able.   Significant Procedures:  None  Significant Labs and Imaging:  Recent Labs  Lab 10/04/18 0419 10/05/18 0233 10/06/18 0201  WBC 5.3 5.6 11.4*  HGB 11.9* 11.0* 10.3*  HCT 35.8* 32.0* 30.2*  PLT 39* 36* 31*   Recent Labs  Lab 10/02/18 0754 10/03/18 0234 10/04/18 0419 10/05/18 0233 10/06/18 0201  NA 128* 128* 132* 132* 133*  K 4.7 4.4 4.8 4.6 4.9  CL 94* 98 102 103 101  CO2 26 23 25 24 24   GLUCOSE 95 93 84 105* 110*  BUN 11 13 10 8 9   CREATININE 1.28* 1.20  0.96 0.88 0.92  CALCIUM 7.7* 7.9* 7.9* 7.7* 7.6*  MG  --   --  1.5* 1.7  --   ALKPHOS 66 61 74 85 80  AST 37 38 39 39 38  ALT 25 24 25 26 24   ALBUMIN 1.4* 1.3* 1.4* 1.3* 1.3*   Results/Tests Pending at  Time of Discharge: None   Discharge Medications:  Allergies as of 10/06/2018   No Known Allergies     Medication List    TAKE these medications   escitalopram 10 MG tablet Commonly known as:  LEXAPRO Take 10 mg by mouth daily.   furosemide 40 MG tablet Commonly known as:  LASIX Take 40 mg by mouth.   lactulose 10 GM/15ML solution Commonly known as:  CHRONULAC Take 45 mLs (30 g total) by mouth 2 (two) times daily. Titrate for 3-4 BM's/ day What changed:  when to take this   losartan 25 MG tablet Commonly known as:  COZAAR Take 25 mg by mouth daily.   metoprolol tartrate 25 MG tablet Commonly known as:  LOPRESSOR Take 1 tablet (25 mg total) by mouth 2 (two) times daily. What changed:    medication strength  how much to take   omeprazole 20 MG capsule Commonly known as:  PRILOSEC TAKE 1 CAPSULE BY MOUTH  DAILY   PROAIR HFA 108 (90 Base) MCG/ACT inhaler Generic drug:  albuterol USE 2 PUFFS BY MOUTH EVERY  4 HOURS AS NEEDED FOR  WHEEZING OR SHORTNESS OF  BREATH (OR COUGHING) What changed:  See the new instructions.   rifaximin 550 MG Tabs tablet Commonly known as:  XIFAXAN Take 1 tablet (550 mg total) by mouth 2 (two) times daily.   simvastatin 20 MG tablet Commonly known as:  ZOCOR Take 20 mg by mouth daily.   spironolactone 50 MG tablet Commonly known as:  ALDACTONE TAKE 1 TABLET BY MOUTH EVERY DAY      Discharge Instructions: Please refer to Patient Instructions section of EMR for full details.  Patient was counseled important signs and symptoms that should prompt return to medical care, changes in medications, dietary instructions, activity restrictions, and follow up appointments.   Follow-Up Appointments: Contact information for after-discharge care    Destination    [facility] Preferred SNF .   Service:  Skilled Nursing Contact information: [address] [place] [phone]             Lovenia Kim, MD 10/06/2018, 3:14 PM PGY-3, [place]

## 2018-10-06 NOTE — Progress Notes (Signed)
Report given to Candace Cruise, LPN at [facility].

## 2018-10-06 NOTE — Clinical Social Work Placement (Signed)
   CLINICAL SOCIAL WORK PLACEMENT  NOTE  Date:  10/06/2018  Patient Details  Name: Hunter Carter MRN: [id number] Date of Birth: 08/03/1951  Clinical Social Work is seeking post-discharge placement for this patient at the [place] level of care (*CSW will initial, date and re-position this form in  chart as items are completed):  Yes   Patient/family provided with [place] Work Department's list of facilities offering this level of care within the geographic area requested by the patient (or if unable, by the patient's family).  Yes   Patient/family informed of their freedom to choose among providers that offer the needed level of care, that participate in Medicare, Medicaid or managed care program needed by the patient, have an available bed and are willing to accept the patient.  Yes   Patient/family informed of [facility]'s ownership interest in [facility] and [facility], as well as of the fact that they are under no obligation to receive care at these facilities.  PASRR submitted to EDS on       PASRR number received on       Existing PASRR number confirmed on 10/03/18     FL2 transmitted to all facilities in geographic area requested by pt/family on 10/03/18     FL2 transmitted to all facilities within larger geographic area on       Patient informed that his/her managed care company has contracts with or will negotiate with certain facilities, including the following:        Yes   Patient/family informed of bed offers received.  Patient chooses bed at [facility]     Physician recommends and patient chooses bed at      Patient to be transferred to [facility] on 10/06/18.  Patient to be transferred to facility by [facility]     Patient family notified on 10/06/18 of transfer.  Name of family member notified:  [name]     PHYSICIAN Please prepare prescriptions, Please prepare priority discharge  summary, including medications     Additional Comment:    _______________________________________________ Alberteen Sam, LCSW 10/06/2018, 3:33 PM

## 2018-10-06 NOTE — Progress Notes (Signed)
Nutrition Follow-up  DOCUMENTATION CODES:   Non-severe (moderate) malnutrition in context of acute illness/injury  INTERVENTION:   -Continue Ensure Enlive po BID, each supplement provides 350 kcal and 20 grams of protein -MVI with minerals daily  NUTRITION DIAGNOSIS:   Moderate Malnutrition related to acute illness, chronic illness as evidenced by mild fat depletion, moderate muscle depletion, percent weight loss.  Ongoing  GOAL:   Patient will meet greater than or equal to 90% of their needs  Progressing  MONITOR:   PO intake, Supplement acceptance, Weight trends, Labs  REASON FOR ASSESSMENT:   Malnutrition Screening Tool    ASSESSMENT:   67 yo male, admitted with choledocholithiasis. PMH significant for paroxysmal atrial fibrillation, cirrhosis 2/2 alcohol use and hepatitis C, CHF, HTN, hyponatremia, hyperkalemia, former smoker.  12/21- refused ERCP per GI notes  Reviewed I/O's: +120 ml x 24 hours and +5.1 L since admission  Drain output: 450 ml x 24 hours  Spoke with pt and two daughters at bedside. All confirm that pt's appetite has improved since admission- noted meal completion 25-100%. Pt estimates that he consumed about 3/4 of his breakfast. Pt endorses consuming his Ensure supplements; like vanilla flavor.   Per CSW notes, plan to d/c to SNF once insurance authorization has been obtained. Discussed importance of continued good meal and supplement intake after discharge.  Labs reviewed: Na: 133.   Diet Order:   Diet Order            Diet Heart Room service appropriate? Yes; Fluid consistency: Thin  Diet effective now              EDUCATION NEEDS:   No education needs have been identified at this time  Skin:  Skin Assessment: Reviewed RN Assessment  Last BM:  10/04/18  Height:   Ht Readings from Last 1 Encounters:  10/02/18 5\' 6"  (1.676 m)    Weight:   Wt Readings from Last 1 Encounters:  10/02/18 83 kg    Ideal Body Weight:  64.5  kg  BMI:  Body mass index is 29.54 kg/m.  Estimated Nutritional Needs:   Kcal:  7544-9201 calories daily (30-35 kcal/kg IBW)  Protein:  77-97 gm daily (1.2-1.5 g/kg IBW)  Fluid:  >/= 1.9 L daily or per MD discretion    Jala Dundon A. Jimmye Norman, RD, LDN, CDE Pager: [phone] After hours Pager: [phone]

## 2018-10-06 NOTE — Progress Notes (Signed)
Patient will DC to: [facility] Anticipated DC date: 10/06/18 Family notified: Sirena Transport by: Corey Harold  Per MD patient ready for DC to [facility] . RN, patient, patient's family, and facility notified of DC. Discharge Summary sent to facility. RN given number for report [phone]. DC packet on chart. Ambulance transport requested for patient.  CSW signing off.  [place], [place]

## 2018-10-06 NOTE — Progress Notes (Signed)
Occupational Therapy Treatment Patient Details Name: Hunter Carter MRN: [id number] DOB: [birth year] Today's Date: 10/06/2018    History of present illness Patient is a 67 y/o male presenting to the ED on 10/01/18 with primary complaints of abdominal pain and weakness. Of note, recent hospital admission acute cholecystitis requiring percutaneous cholecystostomy drain. PMH significant for paroxysmal a-fib, cirrhosis 2/2 alcohol use and hepatitis C, CHF, HTN, hyponatremia, depression.    Pt agreeable to therapy and reporting to feel increased lethargy today. No pain reported. Pt supervisionA for bed mobility and transfers. MinguardA for mobility in room and hallway 100' with RW. Pt transferring to commode with supervisionA and performing light grooming in standing at sink x2 mins with no LOB episodes. Pt continues to be set-upA to minA for ADLs depending on pain in abdomen. Pt improving and progressing with goals. O2 levels remained >94% on RA and no dizziness in standing reported. Pt would benefit from continued OT skilled services for ADLs in SNF setting.    Follow Up Recommendations  SNF    Equipment Recommendations       Recommendations for Other Services      Precautions / Restrictions Precautions Precautions: Fall Restrictions Weight Bearing Restrictions: No       Mobility Bed Mobility Overal bed mobility: Modified Independent Bed Mobility: Supine to Sit;Sit to Supine     Supine to sit: Min guard Sit to supine: Min guard   General bed mobility comments: requires increased time as pt is lethargic today  Transfers Overall transfer level: Needs assistance Equipment used: Rolling walker (2 wheeled) Transfers: Sit to/from Stand Sit to Stand: Supervision         General transfer comment: increased time    Balance Overall balance assessment: Mild deficits observed, not formally tested Sitting-balance support: Feet supported Sitting balance-Leahy Scale: Good      Standing balance support: Bilateral upper extremity supported;During functional activity Standing balance-Leahy Scale: Fair Standing balance comment: reliant on UE support                           ADL either performed or assessed with clinical judgement   ADL Overall ADL's : Needs assistance/impaired                         Toilet Transfer: Min guard   Toileting- Clothing Manipulation and Hygiene: Min guard       Functional mobility during ADLs: Supervision/safety;Rolling walker General ADL Comments: requires increased time      Vision   Vision Assessment?: No apparent visual deficits   Perception     Praxis      Cognition Arousal/Alertness: Awake/alert Behavior During Therapy: WFL for tasks assessed/performed Overall Cognitive Status: Within Functional Limits for tasks assessed                                          Exercises     Shoulder Instructions       General Comments      Pertinent Vitals/ Pain       Pain Assessment: No/denies pain  Home Living                                          Prior Functioning/Environment  Frequency  Min 2X/week        Progress Toward Goals  OT Goals(current goals can now be found in the care plan section)  Progress towards OT goals: Progressing toward goals  Acute Rehab OT Goals Patient Stated Goal: regain strength/mobility OT Goal Formulation: With patient Time For Goal Achievement: 10/10/18 Potential to Achieve Goals: Good ADL Goals Pt Will Perform Lower Body Dressing: with set-up Additional ADL Goal #1: Pt will perform bed mobility and ADL functional transfers with Supervision A  Plan Discharge plan remains appropriate    Co-evaluation                 AM-PAC OT "6 Clicks" Daily Activity     Outcome Measure   Help from another person eating meals?: None Help from another person taking care of personal grooming?: A  Little Help from another person toileting, which includes using toliet, bedpan, or urinal?: A Little Help from another person bathing (including washing, rinsing, drying)?: A Little Help from another person to put on and taking off regular upper body clothing?: A Little Help from another person to put on and taking off regular lower body clothing?: A Little 6 Click Score: 19    End of Session Equipment Utilized During Treatment: Gait belt;Rolling walker  OT Visit Diagnosis: Unsteadiness on feet (R26.81);Muscle weakness (generalized) (M62.81)   Activity Tolerance Patient tolerated treatment well   Patient Left in bed;with call bell/phone within reach;with bed alarm set   Nurse Communication          Time: 3729-0211 OT Time Calculation (min): 12 min  Charges: OT General Charges $OT Visit: 1 Visit OT Treatments $Self Care/Home Management : 8-22 mins  Darryl Nestle) Marsa Aris OTR/L Acute Rehabilitation Services Pager: [phone] Office: [phone]    Fredda Hammed 10/06/2018, 2:36 PM

## 2018-10-06 NOTE — Progress Notes (Signed)
Physical Therapy Treatment Patient Details Name: Hunter Carter MRN: [id number] DOB: July 20, 1951 Today's Date: 10/06/2018    History of Present Illness Patient is a 67 y/o male presenting to the ED on 10/01/18 with primary complaints of abdominal pain and weakness. Of note, recent hospital admission acute cholecystitis requiring percutaneous cholecystostomy drain. PMH significant for paroxysmal a-fib, cirrhosis 2/2 alcohol use and hepatitis C, CHF, HTN, hyponatremia, depression.    PT Comments    Pt continues to make good progress towards his goals, however is limited in safe mobility by decreased strength and endurance. Pt currently is supervision for transfers and min guard for ambulation. D/c plans remain appropriate at this time.  Pt is awaiting d/c this afternoon.    Follow Up Recommendations  SNF;Supervision/Assistance - 24 hour     Equipment Recommendations  None recommended by PT    Recommendations for Other Services       Precautions / Restrictions Precautions Precautions: Fall Restrictions Weight Bearing Restrictions: No    Mobility  Bed Mobility Overal bed mobility: Modified Independent Bed Mobility: Supine to Sit;Sit to Supine     Supine to sit: Min guard Sit to supine: Min guard   General bed mobility comments: met sitting in hall with OT  Transfers Overall transfer level: Needs assistance Equipment used: Rolling walker (2 wheeled) Transfers: Sit to/from Stand Sit to Stand: Supervision         General transfer comment: increased time  Ambulation/Gait Ambulation/Gait assistance: Min guard Gait Distance (Feet): 150 Feet Assistive device: Rolling walker (2 wheeled) Gait Pattern/deviations: Step-through pattern;Decreased stride length;Drifts right/left Gait velocity: min guard for safety, slow steady gait Gait velocity interpretation: <1.31 ft/sec, indicative of household ambulator General Gait Details: light Min A for RW management and stability;  mild unsteadiness throughout         Balance Overall balance assessment: Mild deficits observed, not formally tested Sitting-balance support: Feet supported Sitting balance-Leahy Scale: Good     Standing balance support: Bilateral upper extremity supported;During functional activity Standing balance-Leahy Scale: Fair Standing balance comment: reliant on UE support                            Cognition Arousal/Alertness: Awake/alert Behavior During Therapy: WFL for tasks assessed/performed Overall Cognitive Status: Within Functional Limits for tasks assessed                                           General Comments General comments (skin integrity, edema, etc.): VSS      Pertinent Vitals/Pain Pain Assessment: No/denies pain           PT Goals (current goals can now be found in the care plan section) Acute Rehab PT Goals Patient Stated Goal: regain strength/mobility PT Goal Formulation: With patient Time For Goal Achievement: 10/16/18 Potential to Achieve Goals: Good Progress towards PT goals: Progressing toward goals    Frequency    Min 3X/week      PT Plan Current plan remains appropriate       AM-PAC PT "6 Clicks" Mobility   Outcome Measure  Help needed turning from your back to your side while in a flat bed without using bedrails?: A Little Help needed moving from lying on your back to sitting on the side of a flat bed without using bedrails?: A Little Help needed moving to and from a  bed to a chair (including a wheelchair)?: A Little Help needed standing up from a chair using your arms (e.g., wheelchair or bedside chair)?: A Little Help needed to walk in hospital room?: A Little Help needed climbing 3-5 steps with a railing? : A Lot 6 Click Score: 17    End of Session Equipment Utilized During Treatment: Gait belt Activity Tolerance: Patient tolerated treatment well Patient left: in bed;with call bell/phone within  reach;with family/visitor present Nurse Communication: Mobility status PT Visit Diagnosis: Unsteadiness on feet (R26.81);Other abnormalities of gait and mobility (R26.89);Muscle weakness (generalized) (M62.81)     Time: 3500-9381 PT Time Calculation (min) (ACUTE ONLY): 10 min  Charges:  $Gait Training: 8-22 mins                     Joselinne Lawal B. Migdalia Dk PT, DPT Acute Rehabilitation Services Pager [phone] Office [phone]    [place] 10/06/2018, 3:32 PM

## 2018-10-06 NOTE — Clinical Social Work Note (Signed)
Clinical Social Work Assessment  Patient Details  Name: Hunter Carter MRN: [id number] Date of Birth: 1951/09/11  Date of referral:  10/06/18               Reason for consult:  Discharge Planning                Permission sought to share information with:  Case Manager, Facility Sport and exercise psychologist, Family Supports Permission granted to share information::  Yes, Verbal Permission Granted  Name::     Dealer::  SNFs  Relationship::  daughter  Contact Information:  [phone]  Housing/Transportation Living arrangements for the past 2 months:  [place] Information:  Patient Patient Interpreter Needed:  None Criminal Activity/Legal Involvement Pertinent to Current Situation/Hospitalization:  No - Comment as needed Significant Relationships:  Adult Children Lives with:  Self Do you feel safe going back to the place where you live?  No Need for family participation in patient care:  Yes (Comment)  Care giving concerns:  CSW received referral for possible SNF placement at time of discharge. Spoke with patient regarding possibility of SNF placement . Patient's  family  is currently unable to care for him at their home given patient's current needs and fall risk.  Patient and daughter [name] expressed understanding of PT recommendation and are agreeable to SNF placement at time of discharge. CSW to continue to follow and assist with discharge planning needs.     Social Worker assessment / plan: Spoke with patient  concerning possibility of rehab at SNF before returning home.   Employment status:  Retired Nurse, adult PT Recommendations:  Green / Referral to community resources:  [place]  Patient/Family's Response to care:  Patient recognizes need for rehab before returning home and are agreeable to a SNF in [place]. They report preference for [facility] health care   . CSW  explained insurance authorization process. Patient's family reported that they want patient to get stronger to be able to come back home.    Patient/Family's Understanding of and Emotional Response to Diagnosis, Current Treatment, and Prognosis:  Patient/family is realistic regarding therapy needs and expressed being hopeful for SNF placement. Patient expressed understanding of CSW role and discharge process as well as medical condition. No questions/concerns about plan or treatment.    Emotional Assessment Appearance:  Appears stated age Attitude/Demeanor/Rapport:  Gracious Affect (typically observed):  Accepting, Adaptable Orientation:  Oriented to Self, Oriented to Place, Oriented to  Time, Oriented to Situation Alcohol / Substance use:  Not Applicable Psych involvement (Current and /or in the community):  No (Comment)  Discharge Needs  Concerns to be addressed:  Discharge Planning Concerns Readmission within the last 30 days:  No Current discharge risk:  Dependent with Mobility Barriers to Discharge:  Continued Medical Work up   [employer], LCSW 10/06/2018, 3:49 PM

## 2018-10-06 NOTE — Care Management Important Message (Signed)
Important Message  Patient Details  Name: Hunter Carter MRN: [id number] Date of Birth: 09/02/1951   Medicare Important Message Given:  Yes    Ylonda Storr Montine Circle 10/06/2018, 4:49 PM

## 2018-10-06 NOTE — Progress Notes (Signed)
CSW reached out to [facility] who reports insurance authorization is still pending.   CSW will continue to follow up.   [place], [place]

## 2018-10-06 NOTE — Telephone Encounter (Signed)
Currently admitted. Will close encounter.Danley Danker, RN [facility] RN)

## 2018-10-06 NOTE — Progress Notes (Signed)
[facility]AR is here to transfer pt to [facility]. Notfied daughter [name] and she will meet pt up there at the nsg facility.

## 2018-10-07 DIAGNOSIS — K7290 Hepatic failure, unspecified without coma: Secondary | ICD-10-CM | POA: Diagnosis not present

## 2018-10-07 DIAGNOSIS — K8050 Calculus of bile duct without cholangitis or cholecystitis without obstruction: Secondary | ICD-10-CM | POA: Diagnosis not present

## 2018-10-07 DIAGNOSIS — I10 Essential (primary) hypertension: Secondary | ICD-10-CM | POA: Diagnosis not present

## 2018-10-07 DIAGNOSIS — I480 Paroxysmal atrial fibrillation: Secondary | ICD-10-CM | POA: Diagnosis not present

## 2018-10-07 LAB — CULTURE, BLOOD (ROUTINE X 2)
Culture: NO GROWTH
Culture: NO GROWTH
Special Requests: ADEQUATE

## 2018-10-09 ENCOUNTER — Ambulatory Visit (INDEPENDENT_AMBULATORY_CARE_PROVIDER_SITE_OTHER): Payer: Medicare Other | Admitting: Family Medicine

## 2018-10-09 ENCOUNTER — Non-Acute Institutional Stay: Payer: Medicare Other | Admitting: Primary Care

## 2018-10-09 VITALS — BP 130/70 | HR 63 | Temp 98.3°F | Wt 201.8 lb

## 2018-10-09 DIAGNOSIS — L98491 Non-pressure chronic ulcer of skin of other sites limited to breakdown of skin: Secondary | ICD-10-CM | POA: Diagnosis not present

## 2018-10-09 DIAGNOSIS — Z515 Encounter for palliative care: Secondary | ICD-10-CM

## 2018-10-09 DIAGNOSIS — R251 Tremor, unspecified: Secondary | ICD-10-CM

## 2018-10-09 DIAGNOSIS — E871 Hypo-osmolality and hyponatremia: Secondary | ICD-10-CM | POA: Diagnosis not present

## 2018-10-09 DIAGNOSIS — K8050 Calculus of bile duct without cholangitis or cholecystitis without obstruction: Secondary | ICD-10-CM | POA: Diagnosis not present

## 2018-10-09 MED ORDER — ESCITALOPRAM OXALATE 10 MG PO TABS: 10.0000 mg | ORAL_TABLET | Freq: Every day | ORAL | 0 refills | Status: DC

## 2018-10-09 NOTE — Progress Notes (Signed)
Community  Palliative medicine initial assessment visit attempted to facility pt. Pt had gone out this am at 9 am for a MD appt. Still not back at 3 pm. Notified floor nursing staff will visit next week or tomorrow if urgent (informed to call if needed). F/u 1 week.

## 2018-10-09 NOTE — Progress Notes (Signed)
HPI 67 year-old male who presents for hospital follow-up.  Patient was recently admitted on 10/02/2018 for choledocholithiasis.  Evaluated by GI during that visit and not felt to be a candidate for ERCP secondary to his medical problems.  Patient previously had a cholecystostomy tube placed on 08/30/2018 as he had cholecystitis but was not a candidate for cholecystectomy.  GI did not feel the patient had a realistic risk for cholangitis given the cholecystostomy tube already in place.  He was discharged to SNF as his life expectancy put him outside the care of hospice but his family cannot care for him.  He was to follow-up with outpatient palliative care  Since discharge patient has been doing well.  He states that he intermittently has a crampy abdominal pain but this is much much better than when he went to the hospital.  His daughter who is in the room with him has noticed some bleeding from his GU area while she was cleaning him.  Patient is also had increasing right arm tremor.  This was first noticed in the hospital.  Patient did admit that it was present before during the hospital but has been worse since.  A magnesium level checked in the hospital and was 1.4 and was repleted.  Patient was not discharged on the magnesium.  Patient states he has been having "some depression and trouble sleeping".  He states that he was on some medication prior to switching skilled nursing facilities that helped him sleep.  He is not sure what it was.  He has Lexapro on his medication list but is not sure if that is actually been given to him.  CC: Hospital follow-up   ROS:  Review of Systems See HPI for ROS.   CC, SH/smoking status, and VS noted  Objective: BP 130/70   Pulse 63   Temp 98.3 F (36.8 C) (Oral)   Wt 201 lb 12.8 oz (91.5 kg)   SpO2 96%   BMI 32.57 kg/m  Gen: 67 year old, African-American male, resting comfortably in wheel chair. CV: RRR, no murmur Resp: CTAB, no wheezes,  non-labored Abd: Soft, nontender, distended.  Cholecystostomy tube in place with no surrounding erythema or purulent drainage.  Bag with bile green fluid. Neuro: Alert and oriented, Speech clear, No gross deficits.  Intermittent right-sided low-frequency tremor noted. GU: Patient with 2 cm x 2 cm stage I ulcer in 1 right groin, between thigh and scrotum.   Assessment and plan:  Skin ulcer, limited to breakdown of skin ([place]) Stage I ulcer located between right scrotum and right thigh.  Minimal oozing bleeding.  Can place barrier cream to protect this area.  Choledocholithiasis Patient with no choledocholithiasis.  Pain well controlled since discharge.  Not a candidate for ERCP given his chronic medical problems.  Palliative care involved and seen patient at nursing facility.  Tremor Unclear etiology of tremor.  Magnesium level was low in hospital and repletion did improve.  Will check magnesium level.  Hyponatremia Stable at 133 throughout hospitalization.  Likely secondary to chronic cirrhosis.  Getting CMP to evaluate biliary tree pathology, will see if changed.   Orders Placed This Encounter  Procedures  . Magnesium  . Comprehensive metabolic panel    Order Specific Question:   Has the patient fasted?    Answer:   No    Meds ordered this encounter  Medications  . escitalopram (LEXAPRO) 10 MG tablet    Sig: Take 1 tablet (10 mg total) by mouth daily.  Dispense:  30 tablet    Refill:  0     Guadalupe Dawn MD PGY-2 Family Medicine Resident  10/10/2018 11:08 AM

## 2018-10-09 NOTE — Patient Instructions (Addendum)
It was great seeing you again today! We went over the anatomy of where your stone is at and why that is difficult to treat given your medical problems. Regarding your twitching, we will check another magnesium level.  I will call your skilled nursing facility if the results are abnormal. I will also check your electrolytes, kidney, and liver function as well.  I will resend in a medication called lexapro which should help with your sleeping and mood. I am glad that palliative care made contact, they should be able to help with your living situation.  You can take up to 2g of tylenol per day given your liver function. I would take 1 500mg  tablet 4 times per day or do 325mg  table 6 times per day.

## 2018-10-10 ENCOUNTER — Non-Acute Institutional Stay: Payer: Medicare Other | Admitting: Primary Care

## 2018-10-10 DIAGNOSIS — K7290 Hepatic failure, unspecified without coma: Secondary | ICD-10-CM | POA: Diagnosis not present

## 2018-10-10 DIAGNOSIS — Z515 Encounter for palliative care: Secondary | ICD-10-CM | POA: Diagnosis not present

## 2018-10-10 DIAGNOSIS — K7210 Chronic hepatic failure without coma: Secondary | ICD-10-CM

## 2018-10-10 DIAGNOSIS — L98491 Non-pressure chronic ulcer of skin of other sites limited to breakdown of skin: Secondary | ICD-10-CM | POA: Insufficient documentation

## 2018-10-10 DIAGNOSIS — E871 Hypo-osmolality and hyponatremia: Secondary | ICD-10-CM | POA: Diagnosis not present

## 2018-10-10 LAB — COMPREHENSIVE METABOLIC PANEL
A/G RATIO: 0.4 — AB (ref 1.2–2.2)
ALT: 33 IU/L (ref 0–44)
AST: 54 IU/L — ABNORMAL HIGH (ref 0–40)
Albumin: 1.8 g/dL — CL (ref 3.6–4.8)
Alkaline Phosphatase: 98 IU/L (ref 39–117)
BUN/Creatinine Ratio: 12 (ref 10–24)
BUN: 12 mg/dL (ref 8–27)
Bilirubin Total: 3.6 mg/dL — ABNORMAL HIGH (ref 0.0–1.2)
CO2: 25 mmol/L (ref 20–29)
Calcium: 8.1 mg/dL — ABNORMAL LOW (ref 8.6–10.2)
Chloride: 99 mmol/L (ref 96–106)
Creatinine, Ser: 1.04 mg/dL (ref 0.76–1.27)
GFR calc non Af Amer: 75 mL/min/{1.73_m2} (ref >59)
GFR, EST AFRICAN AMERICAN: 87 mL/min/{1.73_m2} (ref >59)
GLOBULIN, TOTAL: 4.7 g/dL — AB (ref 1.5–4.5)
Glucose: 89 mg/dL (ref 65–99)
Potassium: 4.5 mmol/L (ref 3.5–5.2)
Sodium: 132 mmol/L — ABNORMAL LOW (ref 134–144)
Total Protein: 6.5 g/dL (ref 6.0–8.5)

## 2018-10-10 LAB — MAGNESIUM: Magnesium: 1.6 mg/dL (ref 1.6–2.3)

## 2018-10-10 NOTE — Progress Notes (Signed)
Community Palliative Care Telephone: [phone] Fax: [phone]  PATIENT NAME: Hunter Carter DOB: [birth year] MRN: [id number]  PRIMARY CARE PROVIDER:   Garwin Brothers, MD  REFERRING PROVIDER:  Garwin Brothers, MD [place], [place] [address]  RESPONSIBLE PARTY:   Extended Emergency Contact Information Primary Emergency Contact: Eino Farber, [place] [address] [place] [employer] Phone: [phone] Relation: Daughter Secondary Emergency Contact: Cipriano Mile [place] Mobile Phone: [phone] Relation: Niece   ASSESSMENT and RECOMMENDATIONS:   1. Goals of Care: Patient has DNR on chart, will t/c poa to discuss MOST and any concerns.  Will continue to assess for appropriate services.  2. Hypotension: Recommend review of meds for possible decreases. On 4 bp meds. Sodium  at yesterday's labs= 132, continue to monitor.  3. Depression: Not interactive today. Has Lexapro on med list at [facility] and is receiving. Medication reconciliation of SNF medications and Epic list done at visit.  Will visit again soon in hopes of finding patient more interactive. Lethargy reported to SNF staff. Palliative to follow for symptom management, goals of care clarification. Visit 2-4 weeks.  I spent 25 minutes providing this consultation,  from 1500 to 1525. More than 50% of the time in this consultation was spent coordinating communication.   HISTORY OF PRESENT ILLNESS:  Hunter Carter is a 67 y.o. year old male with multiple medical problems including CHF, CAD, PAT, hypotension, hepatitis C, choledocholithiasis, end stage liver disease. Palliative Care was asked to help address goals of care.   CODE STATUS: DNR  PPS: 30% HOSPICE ELIGIBILITY/DIAGNOSIS: TBD  PAST MEDICAL HISTORY:  Past Medical History:  Diagnosis Date  . Acute hepatic encephalopathy 10/10/2015   [name] 10/10/2015  . Arthritis    "joints ache" (10/10/2015)  . Bleeding  stomach ulcer    "recently" (10/10/2015)  . CAD (coronary artery disease)   . CHF (congestive heart failure) ([place])   . Cirrhosis ([place])    due to HCV/notes 10/10/2015  . ETOH abuse   . GERD (gastroesophageal reflux disease)   . Heart block AV second degree    permanent pacemaker 02/2004  . Hepatitis C   . Hyperlipidemia   . Inferior MI ([place]) 2005  . Presence of permanent cardiac pacemaker   . RBBB   . Systemic hypertension   . Tubulovillous adenoma of colon   . Ventricular tachycardia (HCC)    nonsustained    SOCIAL HX:  Social History   Tobacco Use  . Smoking status: Former Smoker    Packs/day: 0.25    Years: 41.00    Pack years: 10.25    Types: Cigarettes    Last attempt to quit: 11/15/2017    Years since quitting: 0.9  . Smokeless tobacco: Never Used  Substance Use Topics  . Alcohol use: Yes    Comment: 10/10/2015 "last drink was 2-3 months ago; h/o abuse"    ALLERGIES: No Known Allergies   PERTINENT MEDICATIONS:  Outpatient Encounter Medications as of 10/10/2018  Medication Sig  . escitalopram (LEXAPRO) 10 MG tablet Take 1 tablet (10 mg total) by mouth daily.  . furosemide (LASIX) 40 MG tablet Take 40 mg by mouth.  . lactulose (CHRONULAC) 10 GM/15ML solution Take 45 mLs (30 g total) by mouth 2 (two) times daily. Titrate for 3-4 BM's/ day (Patient taking differently: Take 30 g by mouth 3 (three) times daily. Titrate for 3-4 BM's/ day)  . losartan (COZAAR) 25  MG tablet Take 25 mg by mouth daily.  . metoprolol tartrate (LOPRESSOR) 25 MG tablet Take 1 tablet (25 mg total) by mouth 2 (two) times daily.  Marland Kitchen omeprazole (PRILOSEC) 20 MG capsule TAKE 1 CAPSULE BY MOUTH  DAILY (Patient taking differently: Take 20 mg by mouth daily. )  . PROAIR HFA 108 (90 Base) MCG/ACT inhaler USE 2 PUFFS BY MOUTH EVERY  4 HOURS AS NEEDED FOR  WHEEZING OR SHORTNESS OF  BREATH (OR COUGHING) (Patient taking differently: Inhale 1-2 puffs into the lungs every 4 (four) hours as needed for wheezing. )   . rifaximin (XIFAXAN) 550 MG TABS tablet Take 1 tablet (550 mg total) by mouth 2 (two) times daily.  . simvastatin (ZOCOR) 20 MG tablet Take 20 mg by mouth daily.  Marland Kitchen spironolactone (ALDACTONE) 50 MG tablet TAKE 1 TABLET BY MOUTH EVERY DAY   No facility-administered encounter medications on file as of 10/10/2018.     PHYSICAL EXAM:  Vs 95/52 98.3-60-18  94% ra Wt 185.5 lbs General: NAD, frail appearing,  WNWD, lethargic, not able to stay awake for interview. Cardiovascular: regular rate and rhythm, BP low, reported to staff Pulmonary: clear all fields, no cough Abdomen: soft, nontender, + bowel sounds Extremities: no edema, no joint deformities Skin: no rashes, chole tube drain in tact and patent. Neurological: Weakness , lethargy, awakens but cannot stay awake.  Estevan Oaks, NP

## 2018-10-10 NOTE — Assessment & Plan Note (Signed)
Stage I ulcer located between right scrotum and right thigh.  Minimal oozing bleeding.  Can place barrier cream to protect this area.

## 2018-10-10 NOTE — Assessment & Plan Note (Signed)
Unclear etiology of tremor.  Magnesium level was low in hospital and repletion did improve.  Will check magnesium level.

## 2018-10-10 NOTE — Assessment & Plan Note (Addendum)
Stable at 133 throughout hospitalization.  Likely secondary to chronic cirrhosis.  Getting CMP to evaluate biliary tree pathology, will see if changed.

## 2018-10-10 NOTE — Assessment & Plan Note (Signed)
Patient with no choledocholithiasis.  Pain well controlled since discharge.  Not a candidate for ERCP given his chronic medical problems.  Palliative care involved and seen patient at nursing facility.

## 2018-10-13 ENCOUNTER — Other Ambulatory Visit: Payer: Self-pay

## 2018-10-13 NOTE — Patient Outreach (Signed)
[facility]) Care Management  10/13/2018  Hunter Carter May 29, 1951 [id number]   Medication Adherence call to Hunter Carter patient did not answer patient is due on Losartan 25 mg and simvastatin  20 mg. Hunter Carter is showing past due under [place].   [place] Management Direct Dial [phone]  Fax [phone] [email or web address]

## 2018-10-16 ENCOUNTER — Telehealth: Payer: Self-pay

## 2018-10-16 DIAGNOSIS — R5383 Other fatigue: Secondary | ICD-10-CM | POA: Diagnosis not present

## 2018-10-16 DIAGNOSIS — R031 Nonspecific low blood-pressure reading: Secondary | ICD-10-CM | POA: Diagnosis not present

## 2018-10-16 DIAGNOSIS — K769 Liver disease, unspecified: Secondary | ICD-10-CM | POA: Diagnosis not present

## 2018-10-16 DIAGNOSIS — R1011 Right upper quadrant pain: Secondary | ICD-10-CM | POA: Diagnosis not present

## 2018-10-16 DIAGNOSIS — I5032 Chronic diastolic (congestive) heart failure: Secondary | ICD-10-CM | POA: Diagnosis not present

## 2018-10-16 NOTE — Telephone Encounter (Signed)
Calling for lab results from 12/26  Call back is [phone]  Danley Danker, RN [facility] RN)

## 2018-10-17 DIAGNOSIS — R112 Nausea with vomiting, unspecified: Secondary | ICD-10-CM | POA: Diagnosis not present

## 2018-10-17 DIAGNOSIS — K567 Ileus, unspecified: Secondary | ICD-10-CM | POA: Diagnosis not present

## 2018-10-17 DIAGNOSIS — R031 Nonspecific low blood-pressure reading: Secondary | ICD-10-CM | POA: Diagnosis not present

## 2018-10-17 DIAGNOSIS — R5383 Other fatigue: Secondary | ICD-10-CM | POA: Diagnosis not present

## 2018-10-17 DIAGNOSIS — R1011 Right upper quadrant pain: Secondary | ICD-10-CM | POA: Diagnosis not present

## 2018-10-20 ENCOUNTER — Emergency Department (HOSPITAL_COMMUNITY): Payer: Medicare Other

## 2018-10-20 ENCOUNTER — Encounter (HOSPITAL_COMMUNITY): Payer: Self-pay | Admitting: Emergency Medicine

## 2018-10-20 ENCOUNTER — Other Ambulatory Visit: Payer: Self-pay

## 2018-10-20 ENCOUNTER — Inpatient Hospital Stay (HOSPITAL_COMMUNITY)
Admission: EM | Admit: 2018-10-20 | Discharge: 2018-10-21 | DRG: 871 | Disposition: A | Discharge status: Hospice | Payer: Medicare Other | Attending: Family Medicine | Admitting: Family Medicine

## 2018-10-20 DIAGNOSIS — I2510 Atherosclerotic heart disease of native coronary artery without angina pectoris: Secondary | ICD-10-CM | POA: Diagnosis not present

## 2018-10-20 DIAGNOSIS — K7040 Alcoholic hepatic failure without coma: Secondary | ICD-10-CM | POA: Diagnosis present

## 2018-10-20 DIAGNOSIS — I441 Atrioventricular block, second degree: Secondary | ICD-10-CM | POA: Diagnosis present

## 2018-10-20 DIAGNOSIS — D696 Thrombocytopenia, unspecified: Secondary | ICD-10-CM | POA: Diagnosis not present

## 2018-10-20 DIAGNOSIS — E872 Acidosis, unspecified: Secondary | ICD-10-CM

## 2018-10-20 DIAGNOSIS — I480 Paroxysmal atrial fibrillation: Secondary | ICD-10-CM | POA: Diagnosis present

## 2018-10-20 DIAGNOSIS — R402431 Glasgow coma scale score 3-8, in the field [EMT or ambulance]: Secondary | ICD-10-CM | POA: Diagnosis not present

## 2018-10-20 DIAGNOSIS — I959 Hypotension, unspecified: Secondary | ICD-10-CM

## 2018-10-20 DIAGNOSIS — Z950 Presence of cardiac pacemaker: Secondary | ICD-10-CM | POA: Diagnosis not present

## 2018-10-20 DIAGNOSIS — I110 Hypertensive heart disease with heart failure: Secondary | ICD-10-CM | POA: Diagnosis not present

## 2018-10-20 DIAGNOSIS — R0902 Hypoxemia: Secondary | ICD-10-CM | POA: Diagnosis not present

## 2018-10-20 DIAGNOSIS — R680 Hypothermia, not associated with low environmental temperature: Secondary | ICD-10-CM | POA: Diagnosis present

## 2018-10-20 DIAGNOSIS — Z66 Do not resuscitate: Secondary | ICD-10-CM | POA: Diagnosis not present

## 2018-10-20 DIAGNOSIS — E785 Hyperlipidemia, unspecified: Secondary | ICD-10-CM | POA: Diagnosis present

## 2018-10-20 DIAGNOSIS — R571 Hypovolemic shock: Secondary | ICD-10-CM | POA: Diagnosis present

## 2018-10-20 DIAGNOSIS — K7200 Acute and subacute hepatic failure without coma: Secondary | ICD-10-CM | POA: Diagnosis present

## 2018-10-20 DIAGNOSIS — R5381 Other malaise: Secondary | ICD-10-CM | POA: Diagnosis not present

## 2018-10-20 DIAGNOSIS — I10 Essential (primary) hypertension: Secondary | ICD-10-CM | POA: Diagnosis not present

## 2018-10-20 DIAGNOSIS — Z951 Presence of aortocoronary bypass graft: Secondary | ICD-10-CM

## 2018-10-20 DIAGNOSIS — E871 Hypo-osmolality and hyponatremia: Secondary | ICD-10-CM | POA: Diagnosis present

## 2018-10-20 DIAGNOSIS — K219 Gastro-esophageal reflux disease without esophagitis: Secondary | ICD-10-CM | POA: Diagnosis not present

## 2018-10-20 DIAGNOSIS — K7290 Hepatic failure, unspecified without coma: Secondary | ICD-10-CM | POA: Diagnosis present

## 2018-10-20 DIAGNOSIS — Z87891 Personal history of nicotine dependence: Secondary | ICD-10-CM | POA: Diagnosis not present

## 2018-10-20 DIAGNOSIS — R6521 Severe sepsis with septic shock: Secondary | ICD-10-CM | POA: Diagnosis not present

## 2018-10-20 DIAGNOSIS — R404 Transient alteration of awareness: Secondary | ICD-10-CM | POA: Diagnosis not present

## 2018-10-20 DIAGNOSIS — N170 Acute kidney failure with tubular necrosis: Secondary | ICD-10-CM | POA: Diagnosis not present

## 2018-10-20 DIAGNOSIS — A419 Sepsis, unspecified organism: Secondary | ICD-10-CM | POA: Diagnosis not present

## 2018-10-20 DIAGNOSIS — R52 Pain, unspecified: Secondary | ICD-10-CM | POA: Diagnosis not present

## 2018-10-20 DIAGNOSIS — Z79899 Other long term (current) drug therapy: Secondary | ICD-10-CM

## 2018-10-20 DIAGNOSIS — K769 Liver disease, unspecified: Secondary | ICD-10-CM | POA: Diagnosis not present

## 2018-10-20 DIAGNOSIS — F419 Anxiety disorder, unspecified: Secondary | ICD-10-CM | POA: Diagnosis present

## 2018-10-20 DIAGNOSIS — N179 Acute kidney failure, unspecified: Secondary | ICD-10-CM | POA: Diagnosis not present

## 2018-10-20 DIAGNOSIS — I5032 Chronic diastolic (congestive) heart failure: Secondary | ICD-10-CM | POA: Diagnosis present

## 2018-10-20 DIAGNOSIS — R7989 Other specified abnormal findings of blood chemistry: Secondary | ICD-10-CM

## 2018-10-20 DIAGNOSIS — D6959 Other secondary thrombocytopenia: Secondary | ICD-10-CM | POA: Diagnosis present

## 2018-10-20 DIAGNOSIS — B1920 Unspecified viral hepatitis C without hepatic coma: Secondary | ICD-10-CM | POA: Diagnosis present

## 2018-10-20 DIAGNOSIS — R778 Other specified abnormalities of plasma proteins: Secondary | ICD-10-CM

## 2018-10-20 DIAGNOSIS — K7210 Chronic hepatic failure without coma: Secondary | ICD-10-CM | POA: Diagnosis present

## 2018-10-20 DIAGNOSIS — R000 Tachycardia, unspecified: Secondary | ICD-10-CM | POA: Diagnosis not present

## 2018-10-20 DIAGNOSIS — R5383 Other fatigue: Secondary | ICD-10-CM | POA: Diagnosis not present

## 2018-10-20 DIAGNOSIS — E875 Hyperkalemia: Secondary | ICD-10-CM | POA: Diagnosis present

## 2018-10-20 DIAGNOSIS — K7030 Alcoholic cirrhosis of liver without ascites: Secondary | ICD-10-CM | POA: Diagnosis present

## 2018-10-20 DIAGNOSIS — Z515 Encounter for palliative care: Secondary | ICD-10-CM | POA: Diagnosis not present

## 2018-10-20 DIAGNOSIS — R4182 Altered mental status, unspecified: Secondary | ICD-10-CM | POA: Diagnosis present

## 2018-10-20 DIAGNOSIS — Z7951 Long term (current) use of inhaled steroids: Secondary | ICD-10-CM

## 2018-10-20 DIAGNOSIS — K7682 Hepatic encephalopathy: Secondary | ICD-10-CM | POA: Diagnosis present

## 2018-10-20 DIAGNOSIS — R17 Unspecified jaundice: Secondary | ICD-10-CM | POA: Diagnosis not present

## 2018-10-20 LAB — COMPREHENSIVE METABOLIC PANEL
ALT: 82 U/L — ABNORMAL HIGH (ref 0–44)
AST: 145 U/L — ABNORMAL HIGH (ref 15–41)
Albumin: 1.3 g/dL — ABNORMAL LOW (ref 3.5–5.0)
Alkaline Phosphatase: 84 U/L (ref 38–126)
Anion gap: 13 (ref 5–15)
BUN: 89 mg/dL — ABNORMAL HIGH (ref 8–23)
CO2: 16 mmol/L — ABNORMAL LOW (ref 22–32)
Calcium: 7.5 mg/dL — ABNORMAL LOW (ref 8.9–10.3)
Chloride: 93 mmol/L — ABNORMAL LOW (ref 98–111)
Creatinine, Ser: 7.19 mg/dL — ABNORMAL HIGH (ref 0.61–1.24)
GFR calc Af Amer: 8 mL/min — ABNORMAL LOW (ref >60)
GFR calc non Af Amer: 7 mL/min — ABNORMAL LOW (ref >60)
Glucose, Bld: 107 mg/dL — ABNORMAL HIGH (ref 70–99)
Potassium: 5.6 mmol/L — ABNORMAL HIGH (ref 3.5–5.1)
SODIUM: 122 mmol/L — AB (ref 135–145)
Total Bilirubin: 11.8 mg/dL — ABNORMAL HIGH (ref 0.3–1.2)
Total Protein: 7.2 g/dL (ref 6.5–8.1)

## 2018-10-20 LAB — I-STAT ARTERIAL BLOOD GAS, ED
Acid-base deficit: 8 mmol/L — ABNORMAL HIGH (ref 0.0–2.0)
Bicarbonate: 15.6 mmol/L — ABNORMAL LOW (ref 20.0–28.0)
O2 Saturation: 99 %
Patient temperature: 95.3
TCO2: 17 mmol/L — ABNORMAL LOW (ref 22–32)
pCO2 arterial: 26.3 mmHg — ABNORMAL LOW (ref 32.0–48.0)
pH, Arterial: 7.374 (ref 7.350–7.450)
pO2, Arterial: 124 mmHg — ABNORMAL HIGH (ref 83.0–108.0)

## 2018-10-20 LAB — T4, FREE: Free T4: 0.78 ng/dL — ABNORMAL LOW (ref 0.82–1.77)

## 2018-10-20 LAB — I-STAT CG4 LACTIC ACID, ED: Lactic Acid, Venous: 5.52 mmol/L (ref 0.5–1.9)

## 2018-10-20 LAB — CBC WITH DIFFERENTIAL/PLATELET
Abs Immature Granulocytes: 0.26 10*3/uL — ABNORMAL HIGH (ref 0.00–0.07)
Basophils Absolute: 0 10*3/uL (ref 0.0–0.1)
Basophils Relative: 0 %
EOS ABS: 0 10*3/uL (ref 0.0–0.5)
Eosinophils Relative: 0 %
HCT: 39.6 % (ref 39.0–52.0)
Hemoglobin: 13.7 g/dL (ref 13.0–17.0)
Immature Granulocytes: 2 %
Lymphocytes Relative: 11 %
Lymphs Abs: 1.4 10*3/uL (ref 0.7–4.0)
MCH: 32.9 pg (ref 26.0–34.0)
MCHC: 34.6 g/dL (ref 30.0–36.0)
MCV: 95.2 fL (ref 80.0–100.0)
Monocytes Absolute: 2.3 10*3/uL — ABNORMAL HIGH (ref 0.1–1.0)
Monocytes Relative: 18 %
Neutro Abs: 8.7 10*3/uL — ABNORMAL HIGH (ref 1.7–7.7)
Neutrophils Relative %: 69 %
Platelets: UNDETERMINED 10*3/uL (ref 150–400)
RBC: 4.16 MIL/uL — ABNORMAL LOW (ref 4.22–5.81)
RDW: 15.8 % — AB (ref 11.5–15.5)
WBC: 12.7 10*3/uL — ABNORMAL HIGH (ref 4.0–10.5)
nRBC: 0 % (ref 0.0–0.2)

## 2018-10-20 LAB — I-STAT CHEM 8, ED
BUN: 79 mg/dL — AB (ref 8–23)
Calcium, Ion: 0.95 mmol/L — ABNORMAL LOW (ref 1.15–1.40)
Chloride: 93 mmol/L — ABNORMAL LOW (ref 98–111)
Creatinine, Ser: 7.2 mg/dL — ABNORMAL HIGH (ref 0.61–1.24)
Glucose, Bld: 100 mg/dL — ABNORMAL HIGH (ref 70–99)
HCT: 44 % (ref 39.0–52.0)
Hemoglobin: 15 g/dL (ref 13.0–17.0)
Potassium: 5.6 mmol/L — ABNORMAL HIGH (ref 3.5–5.1)
Sodium: 122 mmol/L — ABNORMAL LOW (ref 135–145)
TCO2: 18 mmol/L — AB (ref 22–32)

## 2018-10-20 LAB — TROPONIN I: Troponin I: 0.03 ng/mL (ref <0.03)

## 2018-10-20 LAB — TSH: TSH: 2.183 u[IU]/mL (ref 0.350–4.500)

## 2018-10-20 LAB — AMMONIA: AMMONIA: 62 umol/L — AB (ref 9–35)

## 2018-10-20 LAB — CBG MONITORING, ED: Glucose-Capillary: 70 mg/dL (ref 70–99)

## 2018-10-20 LAB — MAGNESIUM: Magnesium: 2.1 mg/dL (ref 1.7–2.4)

## 2018-10-20 LAB — PROCALCITONIN: Procalcitonin: 3.04 ng/mL

## 2018-10-20 MED ORDER — PIPERACILLIN-TAZOBACTAM 3.375 G IVPB 30 MIN
3.3750 g | Freq: Once | INTRAVENOUS | Status: AC
  Administered 2018-10-20: 3.375 g via INTRAVENOUS
  Filled 2018-10-20: qty 50

## 2018-10-20 MED ORDER — LORAZEPAM 1 MG PO TABS: 1.0000 mg | ORAL_TABLET | ORAL | Status: DC | PRN

## 2018-10-20 MED ORDER — LACTATED RINGERS IV BOLUS (SEPSIS)
1000.0000 mL | Freq: Once | INTRAVENOUS | Status: AC
  Administered 2018-10-20: 1000 mL via INTRAVENOUS

## 2018-10-20 MED ORDER — LACTATED RINGERS IV BOLUS (SEPSIS): 1000.0000 mL | Freq: Once | INTRAVENOUS | Status: DC

## 2018-10-20 MED ORDER — LACTATED RINGERS IV BOLUS (SEPSIS): 500.0000 mL | Freq: Once | INTRAVENOUS | Status: DC

## 2018-10-20 MED ORDER — MORPHINE SULFATE (PF) 2 MG/ML IV SOLN
1.0000 mg | INTRAVENOUS | Status: DC | PRN
  Administered 2018-10-21 (×2): 1 mg via INTRAVENOUS
  Filled 2018-10-20 (×2): qty 1

## 2018-10-20 MED ORDER — LORAZEPAM 2 MG/ML IJ SOLN
1.0000 mg | INTRAMUSCULAR | Status: DC | PRN
  Administered 2018-10-20: 1 mg via INTRAVENOUS
  Filled 2018-10-20: qty 1

## 2018-10-20 MED ORDER — GLYCOPYRROLATE 0.2 MG/ML IJ SOLN: 0.2000 mg | INTRAMUSCULAR | Status: DC | PRN

## 2018-10-20 MED ORDER — NOREPINEPHRINE BITARTRATE 1 MG/ML IV SOLN
0.0000 ug/min | INTRAVENOUS | Status: DC
  Administered 2018-10-20: 2 ug/min via INTRAVENOUS
  Filled 2018-10-20: qty 4

## 2018-10-20 MED ORDER — LORAZEPAM 2 MG/ML PO CONC: 1.0000 mg | ORAL | Status: DC | PRN

## 2018-10-20 MED ORDER — VANCOMYCIN HCL 10 G IV SOLR
1750.0000 mg | Freq: Once | INTRAVENOUS | Status: AC
  Administered 2018-10-20: 1750 mg via INTRAVENOUS
  Filled 2018-10-20: qty 1750

## 2018-10-20 MED ORDER — SODIUM CHLORIDE 0.9 % IV BOLUS
1000.0000 mL | Freq: Once | INTRAVENOUS | Status: AC
  Administered 2018-10-20: 1000 mL via INTRAVENOUS

## 2018-10-20 MED ORDER — LACTATED RINGERS IV BOLUS
1000.0000 mL | Freq: Once | INTRAVENOUS | Status: DC
  Administered 2018-10-20: 1000 mL via INTRAVENOUS

## 2018-10-20 MED ORDER — GLYCOPYRROLATE 1 MG PO TABS
1.0000 mg | ORAL_TABLET | ORAL | Status: DC | PRN
  Filled 2018-10-20: qty 1

## 2018-10-20 NOTE — Consult Note (Signed)
[facility] PCCM PROGRESS NOTE  S: 68 year old male with past medical history significant for end-stage liver disease secondary to hepatitis and alcoholism complicated by hepatic encephalopathy, congestive heart failure, and chronic kidney disease.  His recent course has been complicated by admission for cholelithiasis requiring percutaneous drain and antibiotics.  Most recently he was admitted in early December for dehydration and encephalopathy.  During the hospitalization he was evaluated by palliative care and discussions were had with the patient and his family which resulted in a status change for Mr. Woodrome to DNR and do not hospitalize.  He was a candidate for hospice, but could not be taken care of at home so he went to SNF until the time where he became a candidate for inpatient hospice.  Then on 1/6 he presented again to [facility] emergency department with complaints of intermittent hypotension and altered mental status.  The symptoms were present in the emergency department and his blood pressure did not improve despite 3 L of IV fluid.  He was started on levophed for blood pressure support.  Labs showed acute kidney injury and there was some discussion of hemodialysis.  PCCM and nephrology were consulted.  O: BP 101/64   Pulse 85   Temp (!) 96.4 F (35.8 C)   Resp 19   SpO2 97%   General:  Frail elderly male Neuro:  Oriented to self and year, but lethargic and weak. Does not follow commands.  HEENT:  [place]/AT, PERRL, scleral icterus present.  Cardiovascular:  Tachy, regular, no MRG Lungs:  Diminished Abdomen:  Soft, non-tender Musculoskeletal:  No acute deformity Skin:  Grossly intact.    A/P: Suspect he his acute issue is hypovolemic shock due to being on lactulose and diuretics as an outpatient. He has multiple medical comorbidites including end-stage liver disease. Family is aware this is not a fixable problem. Extended family discussion was had and it was determined that he would not  want to be in the hospital and would certainly want to be kept alive artificially. The decision has been made to discontinue all forms of life support including vasoactive medications. Plan is to stop levophed and pursue comfort measures. I have indicated this plan to the ED care team who will contact the internal medicine team to pursue admission. I will place consult for palliative medicine team at the families request. They are hopeful he will survive long enough to make it to [facility], where his wife was treated. Prognosis is hours to days.    My critical care time excluding procedures: 90 minutes   Georgann Housekeeper, AGACNP-BC [place] Pager [phone] or [phone]  10/20/2018 9:31 PM

## 2018-10-20 NOTE — ED Notes (Signed)
Bair Hugger applied

## 2018-10-20 NOTE — ED Triage Notes (Signed)
Pt arrives to ED from [facility] with complaints of Altered Mental Status for the last three days. EMS reports pt is usually able to walk with a walker and os alert and oriented x4. Pt is currently unresponsive and hypotensive upon arrival. Facility stated that pt has been hypotensive for the last three days. Pt placed in position of comfort with bed locked and lowered, call bell in reach.

## 2018-10-20 NOTE — ED Provider Notes (Signed)
[facility] EMERGENCY DEPARTMENT Provider Note   CSN: [id number] Arrival date & time: 10/20/18  1725     History   Chief Complaint Chief Complaint  Patient presents with  . Altered Mental Status    HPI Makar Slatter is a 68 y.o. male.  HPI  Patient is a 68 year old male with a past medical history of cirrhosis secondary to EtOH abuse, CAD, CHF, hep C, HDL, and recent cholecystostomy PERC drain  placed in November 2019 who presents via EMS from his living facility for further evaluation and management of 3 days of hypotension and altered mental status.  Patient is unable to find any history only stating his name and that he hurts.  Additional history was obtained from the facility who states the patient has been altered since 4 days ago and intermittently hypotensive receiving intermittent IV fluid boluses.  Facility states that at baseline patient is alert and oriented x4 and is able to ambulate without assistance.  Past Medical History:  Diagnosis Date  . Acute hepatic encephalopathy 10/10/2015   [name] 10/10/2015  . Arthritis    "joints ache" (10/10/2015)  . Bleeding stomach ulcer    "recently" (10/10/2015)  . CAD (coronary artery disease)   . CHF (congestive heart failure) ([place])   . Cirrhosis ([place])    due to HCV/notes 10/10/2015  . ETOH abuse   . GERD (gastroesophageal reflux disease)   . Heart block AV second degree    permanent pacemaker 02/2004  . Hepatitis C   . Hyperlipidemia   . Inferior MI ([place]) 2005  . Presence of permanent cardiac pacemaker   . RBBB   . Systemic hypertension   . Tubulovillous adenoma of colon   . Ventricular tachycardia ([place])    nonsustained    Patient Active Problem List   Diagnosis Date Noted  . Sepsis ([place]) 10/20/2018  . Skin ulcer, limited to breakdown of skin ([place]) 10/10/2018  . Tremor 10/09/2018  . Hyponatremia   . Malnutrition of moderate degree 10/03/2018  . End stage liver disease ([place])   . Goals of  care, counseling/discussion   . Palliative care by specialist   . Choledocholithiasis 10/02/2018  . Hypotension   . Dehydration   . Diarrhea   . Abdominal distension   . Sepsis due to Streptococcus species ([place])   . Mobitz II   . Hyperkalemia   . Severe sepsis ([place]) 08/30/2018  . Cholecystitis, acute 08/20/2018  . Abdominal pain 07/20/2018  . Acute hepatic encephalopathy 03/20/2018  . Gastroesophageal reflux disease 03/11/2018  . CAD of autologous vein bypass graft without angina 03/01/2018  . Healthcare maintenance 02/13/2018  . Cirrhosis ([place]) 08/08/2016  . Paroxysmal atrial fibrillation ([place]) 01/05/2016  . Tobacco abuse 01/05/2016  . Hepatic encephalopathy ([place]) 10/10/2015  . Pain, joint, ankle, left 10/10/2015  . Thrombocytopathia ([place]) 10/10/2015  . HLD (hyperlipidemia) 10/10/2015  . Chronic diastolic congestive heart failure ([place]) 10/10/2015  . Altered mental status   . Increased ammonia level   . Leg swelling   . Encephalopathy acute 08/01/2015  . GI bleed 07/29/2015  . Acute GI bleeding 07/29/2015  . Acute respiratory failure ([place])   . Bleeding gastrointestinal   . Hemorrhagic shock ([place])   . CAD s/p CABG 2004 07/20/2013  . Pacemaker - dual chamber Medtronic 2011 07/20/2013  . Hepatitis C 07/20/2013  . Hyperlipidemia 07/20/2013  . PAT (paroxysmal atrial tachycardia) ([place]) 07/20/2013  . Obesity (BMI 30-39.9) 07/20/2013  . NSVT (nonsustained ventricular tachycardia) ([place]) 07/20/2013  .  HTN (hypertension) 07/16/2013    Past Surgical History:  Procedure Laterality Date  . CARDIAC CATHETERIZATION  03/06/04   significant 3 vessel disease, totalled graft obtuse marginal  . CORONARY ARTERY BYPASS GRAFT  02/10/03   LIMA to LAD,SVG to first diagonal, SVT to OM1  . ESOPHAGOGASTRODUODENOSCOPY N/A 07/29/2015   Procedure: ESOPHAGOGASTRODUODENOSCOPY (EGD);  Surgeon: Clarene Essex, MD;  Location: [facility] ENDOSCOPY;  Service: Endoscopy;  Laterality: N/A;  . INSERT / REPLACE / REMOVE  PACEMAKER  03/2010   Medtronic  . IR PARACENTESIS  08/30/2018  . IR PARACENTESIS  09/02/2018  . IR PERC CHOLECYSTOSTOMY  08/30/2018  . KNEE ARTHROSCOPY Left [id number]  . MEDIASTINAL EXPLORATION  02/10/2003  . NM MYOCAR PERF WALL MOTION  04/05/09   low risk scan-abnormal-mod. perfusion defect basal inferoseptal,basal inferior,mid inferoseptal,mid inferior and apical inferior regions  . TOTAL KNEE ARTHROPLASTY Left   . US ECHOCARDIOGRAPHY  01/20/07   mild MR,mild to mod. TR,trace AI,mild PI        Home Medications    Prior to Admission medications   Medication Sig Start Date End Date Taking? Authorizing Provider  atorvastatin (LIPITOR) 10 MG tablet Take 10 mg by mouth daily.   Yes Garwin Brothers, MD  escitalopram (LEXAPRO) 10 MG tablet Take 1 tablet (10 mg total) by mouth daily. 10/09/18  Yes Guadalupe Dawn, MD  furosemide (LASIX) 40 MG tablet Take 40 mg by mouth daily.    Yes [provider]  lactulose (CHRONULAC) 10 GM/15ML solution Take 45 mLs (30 g total) by mouth 2 (two) times daily. Titrate for 3-4 BM's/ day Patient taking differently: Take 30 g by mouth 2 (two) times daily. 9am, 6pm 03/22/18  Yes Sheikh, Omair Latif, DO  losartan (COZAAR) 25 MG tablet Take 25 mg by mouth daily.   Yes [provider]  metoprolol tartrate (LOPRESSOR) 25 MG tablet Take 1 tablet (25 mg total) by mouth 2 (two) times daily. Patient taking differently: Take 25 mg by mouth 2 (two) times daily. 9am, 6pm 10/06/18  Maylon Peppers, MD  Nutritional Supplements (ENSURE PLUS PO) Take by mouth 2 (two) times daily. 9am and 5pm   Yes [provider]  omeprazole (PRILOSEC) 20 MG capsule TAKE 1 CAPSULE BY MOUTH  DAILY Patient taking differently: Take 20 mg by mouth daily.  07/15/18  Yes Lockamy, Timothy, DO  ondansetron (ZOFRAN) 4 MG tablet Take 4 mg by mouth every 6 (six) hours as needed for nausea or vomiting.   Yes [provider]  PROAIR HFA 108 (90 Base) MCG/ACT inhaler USE 2  PUFFS BY MOUTH EVERY  4 HOURS AS NEEDED FOR  WHEEZING OR SHORTNESS OF  BREATH (OR COUGHING) Patient taking differently: Inhale 1-2 puffs into the lungs every 4 (four) hours as needed for wheezing or shortness of breath (cough).  06/20/18  Yes Lockamy, Timothy, DO  promethazine (PHENERGAN) 25 MG tablet Take 25 mg by mouth every 6 (six) hours as needed for nausea or vomiting.   Yes [provider]  rifaximin (XIFAXAN) 550 MG TABS tablet Take 1 tablet (550 mg total) by mouth 2 (two) times daily. Patient taking differently: Take 550 mg by mouth 2 (two) times daily. 9am, 6pm 10/06/18  Maylon Peppers, MD  simethicone (MYLICON) 355 MG chewable tablet Chew 125 mg by mouth 3 (three) times daily before meals. 9am, 12p, 5p   Yes [provider]  spironolactone (ALDACTONE) 25 MG tablet Take 25 mg by mouth daily.   Yes [provider]  promethazine (PHENERGAN) 25 MG/ML injection Inject 25 mg into the muscle once.    [provider]  spironolactone (ALDACTONE) 50 MG tablet TAKE 1 TABLET BY MOUTH EVERY DAY Patient not taking: Reported on 10/20/2018 09/10/18   Nuala Alpha, DO    Family History Family History  Problem Relation Age of Onset  . Heart failure Mother   . Liver disease Father   . Heart failure Sister   . Hypertension Sister   . Hypertension Sister   . Colon cancer Neg Hx     Social History Social History   Tobacco Use  . Smoking status: Former Smoker    Packs/day: 0.25    Years: 41.00    Pack years: 10.25    Types: Cigarettes    Last attempt to quit: 11/15/2017    Years since quitting: 0.9  . Smokeless tobacco: Never Used  Substance Use Topics  . Alcohol use: Yes    Comment: 10/10/2015 "last drink was 2-3 months ago; h/o abuse"  . Drug use: Yes    Types: Marijuana    Comment: last use 2015      Allergies   Patient has no known allergies.   Review of Systems Review of Systems  Unable to perform ROS: Unstable vital signs    Physical  Exam Updated Vital Signs BP (!) 87/46   Pulse 99   Temp 97.9 F (36.6 C)   Resp (!) 21   SpO2 98%   Physical Exam Vitals signs and nursing note reviewed.  Constitutional:      Appearance: He is ill-appearing.  HENT:     Head: Normocephalic and atraumatic.     Right Ear: External ear normal.     Left Ear: External ear normal.     Nose: Nose normal.     Mouth/Throat:     Mouth: Mucous membranes are dry.  Eyes:     General: Scleral icterus present.  Cardiovascular:     Rate and Rhythm: Normal rate.     Pulses: Normal pulses.  Pulmonary:     Breath sounds: Normal breath sounds.  Abdominal:     General: There is distension.  Musculoskeletal:     Right lower leg: Edema present.     Left lower leg: Edema present.  Skin:    General: Skin is warm.     Capillary Refill: Capillary refill takes 2 to 3 seconds.     Findings: No bruising.  Neurological:     Mental Status: He is disoriented and confused.     GCS: GCS eye subscore is 4. GCS verbal subscore is 2. GCS motor subscore is 5.    Patient follows commands by giving a thumbs up in his right upper extremity and wiggles his toes in both lower extremities.  He does not follow any other commands or move his left upper extremity.  PERC drain is noted on the right upper quadrant of the abdomen with purulent drainage surrounding the insertion site.  ED Treatments / Results  Labs (all labs ordered are listed, but only abnormal results are displayed) Labs Reviewed  COMPREHENSIVE METABOLIC PANEL - Abnormal; Notable for the following components:      Result Value   Sodium 122 (*)    Potassium 5.6 (*)    Chloride 93 (*)    CO2 16 (*)    Glucose, Bld 107 (*)    BUN 89 (*)    Creatinine, Ser 7.19 (*)    Calcium 7.5 (*)    Albumin  1.3 (*)    AST 145 (*)    ALT 82 (*)    Total Bilirubin 11.8 (*)    GFR calc non Af Amer 7 (*)    GFR calc Af Amer 8 (*)    All other components within normal limits  CBC WITH DIFFERENTIAL/PLATELET  - Abnormal; Notable for the following components:   WBC 12.7 (*)    RBC 4.16 (*)    RDW 15.8 (*)    Neutro Abs 8.7 (*)    Monocytes Absolute 2.3 (*)    Abs Immature Granulocytes 0.26 (*)    All other components within normal limits  TROPONIN I - Abnormal; Notable for the following components:   Troponin I 0.03 (*)    All other components within normal limits  T4, FREE - Abnormal; Notable for the following components:   Free T4 0.78 (*)    All other components within normal limits  AMMONIA - Abnormal; Notable for the following components:   Ammonia 62 (*)    All other components within normal limits  I-STAT CG4 LACTIC ACID, ED - Abnormal; Notable for the following components:   Lactic Acid, Venous 5.52 (*)    All other components within normal limits  I-STAT CHEM 8, ED - Abnormal; Notable for the following components:   Sodium 122 (*)    Potassium 5.6 (*)    Chloride 93 (*)    BUN 79 (*)    Creatinine, Ser 7.20 (*)    Glucose, Bld 100 (*)    Calcium, Ion 0.95 (*)    TCO2 18 (*)    All other components within normal limits  I-STAT ARTERIAL BLOOD GAS, ED - Abnormal; Notable for the following components:   pCO2 arterial 26.3 (*)    pO2, Arterial 124.0 (*)    Bicarbonate 15.6 (*)    TCO2 17 (*)    Acid-base deficit 8.0 (*)    All other components within normal limits  CULTURE, BLOOD (ROUTINE X 2)  CULTURE, BLOOD (ROUTINE X 2)  URINE CULTURE  PROCALCITONIN  TSH  MAGNESIUM  CBG MONITORING, ED    EKG EKG Interpretation  Date/Time:  Monday October 20 2018 17:55:32 EST Ventricular Rate:  70 PR Interval:    QRS Duration: 167 QT Interval:  469 QTC Calculation: 507 R Axis:   40 Text Interpretation:  Sinus rhythm Right bundle branch block No significant change since last tracing Confirmed by Quintella Reichert [phone]) on 10/20/2018 6:03:19 PM   Radiology Dg Chest Port 1 View  Result Date: 10/20/2018 CLINICAL DATA:  Altered mental status. EXAM: PORTABLE CHEST 1 VIEW  COMPARISON:  Radiographs of October 02, 2018. FINDINGS: Stable cardiomediastinal silhouette. Status post coronary bypass graft. Left-sided pacemaker is unchanged in position. No pneumothorax or pleural effusion is noted. No acute pulmonary disease is noted. Bony thorax is unremarkable. IMPRESSION: No acute cardiopulmonary abnormality seen. Electronically Signed   By: Marijo Conception, M.D.   On: 10/20/2018 19:11    Procedures Procedures (including critical care time)  Medications Ordered in ED Medications  morphine 2 MG/ML injection 1 mg (has no administration in time range)  LORazepam (ATIVAN) tablet 1 mg ( Oral See Alternative 10/20/18 2155)    Or  LORazepam (ATIVAN) 2 MG/ML concentrated solution 1 mg ( Sublingual See Alternative 10/20/18 2155)    Or  LORazepam (ATIVAN) injection 1 mg (1 mg Intravenous Given 10/20/18 2155)  glycopyrrolate (ROBINUL) tablet 1 mg (has no administration in time range)    Or  glycopyrrolate (ROBINUL) injection 0.2 mg (has no administration in time range)    Or  glycopyrrolate (ROBINUL) injection 0.2 mg (has no administration in time range)  lactated ringers bolus 1,000 mL (0 mLs Intravenous Stopped 10/20/18 1843)  vancomycin (VANCOCIN) 1,750 mg in sodium chloride 0.9 % 500 mL IVPB (0 mg Intravenous Stopped 10/20/18 2046)  piperacillin-tazobactam (ZOSYN) IVPB 3.375 g (0 g Intravenous Stopped 10/20/18 1843)  sodium chloride 0.9 % bolus 1,000 mL (0 mLs Intravenous Stopped 10/20/18 2023)     Initial Impression / Assessment and Plan / ED Course  I have reviewed the triage vital signs and the nursing notes.  Pertinent labs & imaging results that were available during my care of the patient were reviewed by me and considered in my medical decision making (see chart for details).     Patient is a 68 year old male who presents with above-stated history exam.  On presentation patient is noted to be hypothermic at 95.3, with a heart rate of 69, respiratory rate of 19, blood  pressure of 74/45, 98% on room air, and exam as above.  CMP shows an NA of 122, K5.6, CO2 16, glucose 107, BUN of 89, creatinine of 7.19, AST of 145, ALT of 82, AG 13.  ABG shows a pH of 7.374, PCO2 of 26.3, bicarb of 15.6.  Ammonia of 62.  TSH of 2.183.  Free T4 of 0.78.  Procalcitonin of 3.04.  Troponin of 0.03.  CBC shows a WBC count of 12.7 and hemoglobin of 13.7.  Lactic acid 5.52.  Patient was given IV fluids and broad-spectrum IV antibiotics on arrival.  Prior to admission of antibiotics blood and urine cultures were obtained.  Given acute renal failure nephrology was consulted.  Please see consultants notes for details.  In addition intensivist service was consulted.  Please see consultants notes for details regarding evaluation and management recommendations.  In brief after long discussion with family by the intensivists decision was made by family to pursue comfort care measures.  Patient admitted to family medicine service for comfort care in guarded condition.  Final Clinical Impressions(s) / ED Diagnoses   Final diagnoses:  Hypotension, unspecified hypotension type  Lactic acid acidosis  Altered mental status, unspecified altered mental status type  Acute renal failure, unspecified acute renal failure type [facility])  Elevated troponin    ED Discharge Orders    None       Hulan Saas, MD 10/20/18 Minus Breeding    Quintella Reichert, MD 10/22/18 [phone]

## 2018-10-20 NOTE — ED Notes (Signed)
I-Stat Lactic Acid results of 5.52 reported to Dr. Ralene Bathe.

## 2018-10-20 NOTE — Consult Note (Addendum)
Reason for Consult: AKI Referring Physician: Tamala Julian, MD  Hunter Carter is an 68 y.o. male.  HPI: [name] has an extensive PMH most significant for cirrhosis due to Etoh and hepatitis C, choledocolithiasis s/p cholecystostomy tube placement during hospitalization in November, paroxysmal A fib with pacemaker, HTN, CHF, hyponatremia, hepatic encephalopathy who was recently discharged 10/06/18 to SNF due to deconditioning and was noted to have an altered mental status for the past 3 days with decreased po intake and hypotension.  He was brought to [facility] for further evaluation.  Labs obtained in the ED were notable for Na 122, K 5.6, BUN/Cr 79/7.2, lactate 5.52, Hgb 15.  We were consulted to further evaluate and manage his AKI and electrolyte abnormalities.  He remains hypotensive despite IVF's with lactated ringers and his mentation has continued to wax and wane.  His family and PCCM were at bedside discussing goals of care.  During his last admission, Palliative Care was consulted and they were discussing hospice if his condition worsened.  The family was initially interested in dialysis if needed, however his daughter is not sure that is in his best interest.    [name] is lethargic and not able to contribute to the history.  The history was obtained from his family and review of the medical records.  Of note, he had been taking losartan at the SNF prior to his admission.  The trend in Scr is seen below.     Trend in Creatinine: Creatinine, Ser  Date/Time Value Ref Range Status  10/20/2018 06:08 PM 7.20 (H) 0.61 - 1.24 mg/dL Final  10/20/2018 05:52 PM 7.19 (H) 0.61 - 1.24 mg/dL Final  10/09/2018 11:15 AM 1.04 0.76 - 1.27 mg/dL Final  10/06/2018 02:01 AM 0.92 0.61 - 1.24 mg/dL Final  10/05/2018 02:33 AM 0.88 0.61 - 1.24 mg/dL Final  10/04/2018 04:19 AM 0.96 0.61 - 1.24 mg/dL Final  10/03/2018 02:34 AM 1.20 0.61 - 1.24 mg/dL Final  10/02/2018 07:54 AM 1.28 (H) 0.61 - 1.24 mg/dL Final   10/02/2018 12:45 AM 1.44 (H) 0.61 - 1.24 mg/dL Final  09/10/2018 03:19 AM 1.38 (H) 0.61 - 1.24 mg/dL Final  09/09/2018 05:19 AM 1.35 (H) 0.61 - 1.24 mg/dL Final  09/08/2018 09:50 AM 1.51 (H) 0.61 - 1.24 mg/dL Final  09/07/2018 04:10 AM 1.37 (H) 0.61 - 1.24 mg/dL Final  09/06/2018 06:02 AM 1.26 (H) 0.61 - 1.24 mg/dL Final  09/05/2018 04:14 AM 1.32 (H) 0.61 - 1.24 mg/dL Final  09/04/2018 03:34 AM 1.30 (H) 0.61 - 1.24 mg/dL Final  09/03/2018 06:08 AM 1.15 0.61 - 1.24 mg/dL Final  09/02/2018 09:46 AM 0.91 0.61 - 1.24 mg/dL Final  09/01/2018 09:28 AM 1.08 0.61 - 1.24 mg/dL Final  09/01/2018 06:02 AM 1.03 0.61 - 1.24 mg/dL Final  08/31/2018 11:41 AM 1.26 (H) 0.61 - 1.24 mg/dL Final  08/31/2018 08:11 AM 1.23 0.61 - 1.24 mg/dL Final  08/31/2018 04:29 AM 1.31 (H) 0.61 - 1.24 mg/dL Final  08/30/2018 07:05 AM 1.47 (H) 0.61 - 1.24 mg/dL Final  08/15/2018 11:12 AM 0.85 0.76 - 1.27 mg/dL Final  07/24/2018 04:58 AM 0.80 0.61 - 1.24 mg/dL Final  07/22/2018 04:47 AM 0.96 0.61 - 1.24 mg/dL Final  07/21/2018 03:52 AM 0.98 0.61 - 1.24 mg/dL Final  07/20/2018 03:21 PM 0.85 0.61 - 1.24 mg/dL Final  03/22/2018 08:58 AM 0.98 0.61 - 1.24 mg/dL Final  03/21/2018 04:34 PM 0.77 0.61 - 1.24 mg/dL Final  03/21/2018 04:27 AM 0.83 0.61 - 1.24 mg/dL Final  03/20/2018 04:20  PM 0.93 0.61 - 1.24 mg/dL Final  09/17/2017 01:13 PM 0.88 0.61 - 1.24 mg/dL Final  09/09/2017 02:46 AM 0.78 0.61 - 1.24 mg/dL Final  09/08/2017 05:00 AM 0.73 0.61 - 1.24 mg/dL Final  09/07/2017 06:34 PM 0.98 0.61 - 1.24 mg/dL Final  04/04/2017 07:52 AM 0.85 0.61 - 1.24 mg/dL Final  02/15/2017 11:27 AM 0.80 0.61 - 1.24 mg/dL Final  09/18/2016 09:06 PM 0.83 0.61 - 1.24 mg/dL Final  10/13/2015 04:33 AM 0.77 0.61 - 1.24 mg/dL Final  10/12/2015 05:31 AM 0.84 0.61 - 1.24 mg/dL Final  10/11/2015 01:38 AM 0.77 0.61 - 1.24 mg/dL Final  10/10/2015 03:35 PM 0.78 0.61 - 1.24 mg/dL Final  08/04/2015 05:12 AM 0.81 0.61 - 1.24 mg/dL Final  08/03/2015 05:35  AM 0.73 0.61 - 1.24 mg/dL Final  08/02/2015 02:20 AM 0.77 0.61 - 1.24 mg/dL Final  08/01/2015 02:52 AM 0.86 0.61 - 1.24 mg/dL Final  07/31/2015 02:25 AM 0.98 0.61 - 1.24 mg/dL Final  07/30/2015 02:43 AM 1.01 0.61 - 1.24 mg/dL Final  07/29/2015 07:13 AM 1.15 0.61 - 1.24 mg/dL Final  07/29/2015 12:51 AM 0.85 0.61 - 1.24 mg/dL Final    PMH:   Past Medical History:  Diagnosis Date  . Acute hepatic encephalopathy 10/10/2015   [name] 10/10/2015  . Arthritis    "joints ache" (10/10/2015)  . Bleeding stomach ulcer    "recently" (10/10/2015)  . CAD (coronary artery disease)   . CHF (congestive heart failure) ([place])   . Cirrhosis ([place])    due to HCV/notes 10/10/2015  . ETOH abuse   . GERD (gastroesophageal reflux disease)   . Heart block AV second degree    permanent pacemaker 02/2004  . Hepatitis C   . Hyperlipidemia   . Inferior MI ([place]) 2005  . Presence of permanent cardiac pacemaker   . RBBB   . Systemic hypertension   . Tubulovillous adenoma of colon   . Ventricular tachycardia ([facility])    nonsustained    PSH:   Past Surgical History:  Procedure Laterality Date  . CARDIAC CATHETERIZATION  03/06/04   significant 3 vessel disease, totalled graft obtuse marginal  . CORONARY ARTERY BYPASS GRAFT  02/10/03   LIMA to LAD,SVG to first diagonal, SVT to OM1  . ESOPHAGOGASTRODUODENOSCOPY N/A 07/29/2015   Procedure: ESOPHAGOGASTRODUODENOSCOPY (EGD);  Surgeon: Clarene Essex, MD;  Location: [facility] ENDOSCOPY;  Service: Endoscopy;  Laterality: N/A;  . INSERT / REPLACE / REMOVE PACEMAKER  03/2010   [employer]  . IR PARACENTESIS  08/30/2018  . IR PARACENTESIS  09/02/2018  . IR PERC CHOLECYSTOSTOMY  08/30/2018  . KNEE ARTHROSCOPY Left [phone]  . MEDIASTINAL EXPLORATION  02/10/2003  . NM MYOCAR PERF WALL MOTION  04/05/09   low risk scan-abnormal-mod. perfusion defect basal inferoseptal,basal inferior,mid inferoseptal,mid inferior and apical inferior regions  . TOTAL KNEE ARTHROPLASTY Left   . US  ECHOCARDIOGRAPHY  01/20/07   mild MR,mild to mod. TR,trace AI,mild PI    Allergies: No Known Allergies  Medications:   Prior to Admission medications   Medication Sig Start Date End Date Taking? Authorizing Provider  atorvastatin (LIPITOR) 10 MG tablet Take 10 mg by mouth daily.   Yes Garwin Brothers, MD  escitalopram (LEXAPRO) 10 MG tablet Take 1 tablet (10 mg total) by mouth daily. 10/09/18  Yes Guadalupe Dawn, MD  furosemide (LASIX) 40 MG tablet Take 40 mg by mouth daily.    Yes [provider]  losartan (COZAAR) 25 MG tablet Take 25 mg by mouth daily.  Yes [provider]  lactulose (CHRONULAC) 10 GM/15ML solution Take 45 mLs (30 g total) by mouth 2 (two) times daily. Titrate for 3-4 BM's/ day Patient taking differently: Take 30 g by mouth 3 (three) times daily. Titrate for 3-4 BM's/ day 03/22/18   Raiford Noble Latif, DO  metoprolol tartrate (LOPRESSOR) 25 MG tablet Take 1 tablet (25 mg total) by mouth 2 (two) times daily. 10/06/18   Lovenia Kim, MD  omeprazole (PRILOSEC) 20 MG capsule TAKE 1 CAPSULE BY MOUTH  DAILY Patient taking differently: Take 20 mg by mouth daily.  07/15/18   Lockamy, Timothy, DO  PROAIR HFA 108 (90 Base) MCG/ACT inhaler USE 2 PUFFS BY MOUTH EVERY  4 HOURS AS NEEDED FOR  WHEEZING OR SHORTNESS OF  BREATH (OR COUGHING) Patient taking differently: Inhale 1-2 puffs into the lungs every 4 (four) hours as needed for wheezing.  06/20/18   Nuala Alpha, DO  rifaximin (XIFAXAN) 550 MG TABS tablet Take 1 tablet (550 mg total) by mouth 2 (two) times daily. 10/06/18   Lovenia Kim, MD  spironolactone (ALDACTONE) 50 MG tablet TAKE 1 TABLET BY MOUTH EVERY DAY 09/10/18   Nuala Alpha, DO    Inpatient medications:   Discontinued Meds:   Medications Discontinued During This Encounter  Medication Reason  . lactated ringers bolus 1,000 mL   . lactated ringers bolus 1,000 mL   . lactated ringers bolus 500 mL     Social History:  reports that he quit  smoking about 11 months ago. His smoking use included cigarettes. He has a 10.25 pack-year smoking history. He has never used smokeless tobacco. He reports current alcohol use. He reports current drug use. Drug: Marijuana.  Family History:   Family History  Problem Relation Age of Onset  . Heart failure Mother   . Liver disease Father   . Heart failure Sister   . Hypertension Sister   . Hypertension Sister   . Colon cancer Neg Hx     Review of systems not obtained due to patient factors. Weight change:   Intake/Output Summary (Last 24 hours) at 10/20/2018 2056 Last data filed at 10/20/2018 1844 Gross per 24 hour  Intake 1050 ml  Output 50 ml  Net 1000 ml   BP (!) 93/49   Pulse 79   Temp (!) 96.4 F (35.8 C)   Resp 14   SpO2 99%  Vitals:   10/20/18 2008 10/20/18 2040 10/20/18 2046 10/20/18 2050  BP: (!) 90/50 (!) 88/46 (!) 75/42 (!) 93/49  Pulse: 77 77 74 79  Resp: (!) 21 18 16 14   Temp:  (!) 96.3 F (35.7 C) (!) 96.3 F (35.7 C) (!) 96.4 F (35.8 C)  TempSrc:      SpO2: 98% 100% 98% 99%     General appearance: slowed mentation Head: Normocephalic, without obvious abnormality, atraumatic Eyes: +icterus Resp: clear to auscultation bilaterally Cardio: no rub GI: mildly distended, cholecystostomy tube in place with serosanguinous drainage, +BS, soft, nontender Extremities: edema trace pretibial L>R Neuro: somnolent but arousable, oriented to person and place  Labs: Basic Metabolic Panel: Recent Labs  Lab 10/20/18 1752 10/20/18 1808  NA 122* 122*  K 5.6* 5.6*  CL 93* 93*  CO2 16*  --   GLUCOSE 107* 100*  BUN 89* 79*  CREATININE 7.19* 7.20*  ALBUMIN 1.3*  --   CALCIUM 7.5*  --    Liver Function Tests: Recent Labs  Lab 10/20/18 1752  AST 145*  ALT 82*  ALKPHOS  84  BILITOT 11.8*  PROT 7.2  ALBUMIN 1.3*   No results for input(s): LIPASE, AMYLASE in the last 168 hours. Recent Labs  Lab 10/20/18 1752  AMMONIA 62*   CBC: Recent Labs  Lab  10/20/18 1752 10/20/18 1808  WBC 12.7*  --   NEUTROABS 8.7*  --   HGB 13.7 15.0  HCT 39.6 44.0  MCV 95.2  --   PLT PLATELET CLUMPS NOTED ON SMEAR, UNABLE TO ESTIMATE  --    PT/INR: @LABRCNTIP (inr:5) Cardiac Enzymes: ) Recent Labs  Lab 10/20/18 1752  TROPONINI 0.03*   CBG: Recent Labs  Lab 10/20/18 1735  GLUCAP 70    Iron Studies: No results for input(s): IRON, TIBC, TRANSFERRIN, FERRITIN in the last 168 hours.  Xrays/Other Studies: Dg Chest Port 1 View  Result Date: 10/20/2018 CLINICAL DATA:  Altered mental status. EXAM: PORTABLE CHEST 1 VIEW COMPARISON:  Radiographs of October 02, 2018. FINDINGS: Stable cardiomediastinal silhouette. Status post coronary bypass graft. Left-sided pacemaker is unchanged in position. No pneumothorax or pleural effusion is noted. No acute pulmonary disease is noted. Bony thorax is unremarkable. IMPRESSION: No acute cardiopulmonary abnormality seen. Electronically Signed   By: Marijo Conception, M.D.   On: 10/20/2018 19:11     Assessment/Plan: 1.  AKI- presumably ischemic ATN in setting of hypotension, hypovolemia, and concomitant ARB therapy.  Agree with IVF's and manage conservatively as he is a poor dialysis candidate given his irreversible disease processes. 1. Continue to hold ARB and would not resume as risks outweigh benefits.  2. Hyperkalemia- due to #1 and metabolic acidosis.  Recommend initiating isotonic bicarb and follow 3. Metabolic acidosis- due to #1.  Recommend isotonic bicarb 4. Altered mental status- likely due to hepatic encephalopathy (ammonia level 62) and hypotension.  He was on rifaximin at the SNF but question whether he has been able to take the medications over the past few days due to his decline in mental status. 5. Hypotension- possibly infectious but also partly related to hypovolemia.  IVF's for now and abx per PCCM. 6. Hyponatremia- multifactorial in patient with cirrhosis, AKI, and hypovolemia.  Follow after  IVF's. 7. Abnormal LFT's worrisome with markedly elevated t bili despite cholecystostomy tube. 8. choledocolithiasis s/p cholecystostomy tube- has bloody drainage from around the tube. 9. Cirrhosis- due to Etoh and hep C (he has been treated for hep C). 10. Disposition- poor overall prognosis.  The family is discussing goals of care with PCCM staff now and sounds like they are leaning toward comfort care and hospice.  Will await their decision but he is a poor dialysis candidate given his multiple end stage disease processes and poor functional status and quality of life.  Would treat conservatively for now and would not recommend initiating CVVHD due to his overall prognosis.    Governor Rooks Shiven Junious 10/20/2018, 8:56 PM

## 2018-10-20 NOTE — H&P (Addendum)
[facility] Admission History and Physical Service Pager: [phone]  Patient name: Hunter Carter Medical record number: [id number] Date of birth: [birth year] Age: 68 y.o. Gender: male  Primary Care Provider: Garwin Brothers, MD Consultants: nephrology, CCM Code Status: DNR, comfort care  Chief Complaint: sepsis  Assessment and Plan: Hunter Carter is a 68 y.o. male presenting with hypotension and AMS x3 days . PMH is significant for cirrhosis, CAD, CHF, hep C, HDL, recent cholecystostomy PERC drain  Sepsis- with AMS. Infection source unknown but possibly cholangitis given recent admission for choledocholithiasis. H/o end-stage liver disease with hepatic encephalopathy. No further investigation of source at this time. On admission, patient was hypotensive, hypothermic, and altered. Labs found acute renal failure, hyponatremia, transaminitis, hyperammonemia, hyperbilirubinemia to 11.8. Imaging: cxray showed no acute cardiopulmonary abnormalities. on exam: patient does not respond to verbal stimuli, occasional incoherent speech, does not appear to be in acute distress, vascular congestion noted in superficial veins, heart rate regular, lungs clear. In ED, nephrology and CCM were consulted and spoke with family who decided on comfort care measures only and would like to pursue hospice care transfer. Patient initially received bear hugger for hypothermia, bolus of LR, zosyn, vancomycin, ativan, and levophed drip to increase BP. Offered chaplain services to family who stated they have a personal pastor coming to hospital. Family does not have POA documentation but have been in conversation about wishes since last admission in November with palliative medicine team and all 5 adult children have been in communication over the phone (2 here in person) and are in unanimous agreement with comfort care measures only. Family members present verified understanding of what that plan will  entail. This patient is well known to [employer] service, including this Probation officer. This Probation officer had discussed his wishes with him at previous admission and comfort care is entirely in line with his verbalized wishes at previous admission should his health decline further. -Admit to palliative for comfort care, attending Dr. Owens Shark - d/c cardiac monitoring - d/c levophed - d/c antibiotics - d/c pacemaker - d/c oxygen supplementation - d/c home meds - f/u palliative - morphine 1mg  q2hrs for pain or dyspnea PRN - ativan PRN for agitation/anxiety - robinul PRN for secretions - anti-emesis PRN - tylenol PRN - bear hugger PRN for comfort - daily vitals - no daily labs - consult case management/CSW for possible hospice care facility transfer - if abdomen continued to distend and is uncomfortable for patient, could consider therapeutic paracentesis  Patient's other chronic problems that were addressed and will not be actively managed while on comfort care measures are: Cirrhosis Acute kidney failure CAD s/p CABG CHF Hep C HLD HTN Electrolyte abnormalities Paroxysmal Afib  FEN/GI: regular diet as tolerated Prophylaxis: none  Disposition: inpatient palliative care with transfer to hospice facility per family request as tolerated  History of Present Illness:  Hunter Carter is a 68 y.o. male presenting with sepsis.  Patient unable to provide history. Patient's adult daughter, son, daughter-in-law, and sister-in-law in room to provide history. Patient residing at rehab facility after recent discharge from hospital. He has had rapidly decreasing function for ~3 days. He has not been verbal or responsive to family when he previously was able to carry on a conversation. He has not been eating for this period. He has not been active. They state that he has had a low blood pressure.   Review Of Systems: Per HPI with the following additions per family members present:   Review of Systems  Constitutional: Positive for weight loss. Negative for fever.  Neurological: Positive for speech change and weakness.  Psychiatric/Behavioral: Positive for hallucinations and memory loss. The patient has insomnia.     Patient Active Problem List   Diagnosis Date Noted  . Skin ulcer, limited to breakdown of skin ([place]) 10/10/2018  . Tremor 10/09/2018  . Hyponatremia   . Malnutrition of moderate degree 10/03/2018  . End stage liver disease ([place])   . Goals of care, counseling/discussion   . Palliative care by specialist   . Choledocholithiasis 10/02/2018  . Hypotension   . Dehydration   . Diarrhea   . Abdominal distension   . Sepsis due to Streptococcus species ([place])   . Mobitz II   . Hyperkalemia   . Severe sepsis ([place]) 08/30/2018  . Cholecystitis, acute 08/20/2018  . Abdominal pain 07/20/2018  . Acute hepatic encephalopathy 03/20/2018  . Gastroesophageal reflux disease 03/11/2018  . CAD of autologous vein bypass graft without angina 03/01/2018  . Healthcare maintenance 02/13/2018  . Cirrhosis ([place]) 08/08/2016  . Paroxysmal atrial fibrillation ([place]) 01/05/2016  . Tobacco abuse 01/05/2016  . Hepatic encephalopathy ([place]) 10/10/2015  . Pain, joint, ankle, left 10/10/2015  . Thrombocytopathia ([place]) 10/10/2015  . HLD (hyperlipidemia) 10/10/2015  . Chronic diastolic congestive heart failure ([place]) 10/10/2015  . Altered mental status   . Increased ammonia level   . Leg swelling   . Encephalopathy acute 08/01/2015  . GI bleed 07/29/2015  . Acute GI bleeding 07/29/2015  . Acute respiratory failure ([place])   . Bleeding gastrointestinal   . Hemorrhagic shock ([place])   . CAD s/p CABG 2004 07/20/2013  . Pacemaker - dual chamber Medtronic 2011 07/20/2013  . Hepatitis C 07/20/2013  . Hyperlipidemia 07/20/2013  . PAT (paroxysmal atrial tachycardia) ([place]) 07/20/2013  . Obesity (BMI 30-39.9) 07/20/2013  . NSVT (nonsustained ventricular tachycardia) ([place]) 07/20/2013  . HTN (hypertension)  07/16/2013    Past Medical History: Past Medical History:  Diagnosis Date  . Acute hepatic encephalopathy 10/10/2015   [name] 10/10/2015  . Arthritis    "joints ache" (10/10/2015)  . Bleeding stomach ulcer    "recently" (10/10/2015)  . CAD (coronary artery disease)   . CHF (congestive heart failure) ([place])   . Cirrhosis ([place])    due to HCV/notes 10/10/2015  . ETOH abuse   . GERD (gastroesophageal reflux disease)   . Heart block AV second degree    permanent pacemaker 02/2004  . Hepatitis C   . Hyperlipidemia   . Inferior MI ([place]) 2005  . Presence of permanent cardiac pacemaker   . RBBB   . Systemic hypertension   . Tubulovillous adenoma of colon   . Ventricular tachycardia (HCC)    nonsustained    Past Surgical History: Past Surgical History:  Procedure Laterality Date  . CARDIAC CATHETERIZATION  03/06/04   significant 3 vessel disease, totalled graft obtuse marginal  . CORONARY ARTERY BYPASS GRAFT  02/10/03   LIMA to LAD,SVG to first diagonal, SVT to OM1  . ESOPHAGOGASTRODUODENOSCOPY N/A 07/29/2015   Procedure: ESOPHAGOGASTRODUODENOSCOPY (EGD);  Surgeon: Clarene Essex, MD;  Location: [facility] ENDOSCOPY;  Service: Endoscopy;  Laterality: N/A;  . INSERT / REPLACE / REMOVE PACEMAKER  03/2010   Medtronic  . IR PARACENTESIS  08/30/2018  . IR PARACENTESIS  09/02/2018  . IR PERC CHOLECYSTOSTOMY  08/30/2018  . KNEE ARTHROSCOPY Left [phone]  . MEDIASTINAL EXPLORATION  02/10/2003  . NM MYOCAR PERF WALL MOTION  04/05/09   low risk scan-abnormal-mod. perfusion defect basal inferoseptal,basal  inferior,mid inferoseptal,mid inferior and apical inferior regions  . TOTAL KNEE ARTHROPLASTY Left   . US ECHOCARDIOGRAPHY  01/20/07   mild MR,mild to mod. TR,trace AI,mild PI    Social History: Social History   Tobacco Use  . Smoking status: Former Smoker    Packs/day: 0.25    Years: 41.00    Pack years: 10.25    Types: Cigarettes    Last attempt to quit: 11/15/2017    Years since  quitting: 0.9  . Smokeless tobacco: Never Used  Substance Use Topics  . Alcohol use: Yes    Comment: 10/10/2015 "last drink was 2-3 months ago; h/o abuse"  . Drug use: Yes    Types: Marijuana    Comment: last use 2015    Additional social history: arrived from rehab facility Please also refer to relevant sections of EMR.  Family History: Family History  Problem Relation Age of Onset  . Heart failure Mother   . Liver disease Father   . Heart failure Sister   . Hypertension Sister   . Hypertension Sister   . Colon cancer Neg Hx     Allergies and Medications: No Known Allergies No current facility-administered medications on file prior to encounter.    Current Outpatient Medications on File Prior to Encounter  Medication Sig Dispense Refill  . atorvastatin (LIPITOR) 10 MG tablet Take 10 mg by mouth daily.    Marland Kitchen escitalopram (LEXAPRO) 10 MG tablet Take 1 tablet (10 mg total) by mouth daily. 30 tablet 0  . furosemide (LASIX) 40 MG tablet Take 40 mg by mouth daily.     Marland Kitchen lactulose (CHRONULAC) 10 GM/15ML solution Take 45 mLs (30 g total) by mouth 2 (two) times daily. Titrate for 3-4 BM's/ day (Patient taking differently: Take 30 g by mouth 2 (two) times daily. 9am, 6pm) 240 mL 5  . losartan (COZAAR) 25 MG tablet Take 25 mg by mouth daily.    . metoprolol tartrate (LOPRESSOR) 25 MG tablet Take 1 tablet (25 mg total) by mouth 2 (two) times daily. (Patient taking differently: Take 25 mg by mouth 2 (two) times daily. 9am, 6pm) 60 tablet 0  . Nutritional Supplements (ENSURE PLUS PO) Take by mouth 2 (two) times daily. 9am and 5pm    . omeprazole (PRILOSEC) 20 MG capsule TAKE 1 CAPSULE BY MOUTH  DAILY (Patient taking differently: Take 20 mg by mouth daily. ) 30 capsule 3  . ondansetron (ZOFRAN) 4 MG tablet Take 4 mg by mouth every 6 (six) hours as needed for nausea or vomiting.    Marland Kitchen PROAIR HFA 108 (90 Base) MCG/ACT inhaler USE 2 PUFFS BY MOUTH EVERY  4 HOURS AS NEEDED FOR  WHEEZING OR  SHORTNESS OF  BREATH (OR COUGHING) (Patient taking differently: Inhale 1-2 puffs into the lungs every 4 (four) hours as needed for wheezing or shortness of breath (cough). ) 8.5 g 2  . promethazine (PHENERGAN) 25 MG tablet Take 25 mg by mouth every 6 (six) hours as needed for nausea or vomiting.    . rifaximin (XIFAXAN) 550 MG TABS tablet Take 1 tablet (550 mg total) by mouth 2 (two) times daily. (Patient taking differently: Take 550 mg by mouth 2 (two) times daily. 9am, 6pm) 42 tablet 0  . simethicone (MYLICON) 976 MG chewable tablet Chew 125 mg by mouth 3 (three) times daily before meals. 9am, 12p, 5p    . spironolactone (ALDACTONE) 25 MG tablet Take 25 mg by mouth daily.    . promethazine (PHENERGAN)  25 MG/ML injection Inject 25 mg into the muscle once.    Marland Kitchen spironolactone (ALDACTONE) 50 MG tablet TAKE 1 TABLET BY MOUTH EVERY DAY (Patient not taking: Reported on 10/20/2018) 90 tablet 0    Objective: BP 101/64   Pulse 85   Temp (!) 96.4 F (35.8 C)   Resp 19   SpO2 97%  Exam: General: NAD, non-communicative but alert Eyes: scleral icterus present, does not track people in room ENTM: dry mucous membranes Cardiovascular: RRR, no murmur appreciated Respiratory: CTAB on anterior chest Gastrointestinal: abdomen distended, portal vascular congestion apparent on chest/neck vasculature MSK: muscle wasting on limbs Derm: dry skin, no rashes noted Neuro: alert, moving limbs freely and equally, unable to follow commands Psych: flat affect  Labs and Imaging: CBC BMET  Recent Labs  Lab 10/20/18 1752 10/20/18 1808  WBC 12.7*  --   HGB 13.7 15.0  HCT 39.6 44.0  PLT PLATELET CLUMPS NOTED ON SMEAR, UNABLE TO ESTIMATE  --    Recent Labs  Lab 10/20/18 1752 10/20/18 1808  NA 122* 122*  K 5.6* 5.6*  CL 93* 93*  CO2 16*  --   BUN 89* 79*  CREATININE 7.19* 7.20*  GLUCOSE 107* 100*  CALCIUM 7.5*  --      Other labs noted in AP  [place], [place] L, DO 10/20/2018, 9:19 PM PGY-1, [place] Intern pager: [phone], text pages welcome  FPTS Upper-Level Resident Addendum   I have independently interviewed and examined the patient. I have discussed the above with the original author and agree with their documentation. My edits for correction/addition/clarification are in blue. Please see also any attending notes.    Ralene Ok, MD PGY-3, [place] Service pager: [phone] (text pages welcome through [facility])

## 2018-10-21 DIAGNOSIS — Z515 Encounter for palliative care: Secondary | ICD-10-CM

## 2018-10-21 DIAGNOSIS — E872 Acidosis, unspecified: Secondary | ICD-10-CM | POA: Diagnosis present

## 2018-10-21 DIAGNOSIS — A419 Sepsis, unspecified organism: Principal | ICD-10-CM

## 2018-10-21 DIAGNOSIS — I959 Hypotension, unspecified: Secondary | ICD-10-CM

## 2018-10-21 DIAGNOSIS — R4182 Altered mental status, unspecified: Secondary | ICD-10-CM

## 2018-10-21 DIAGNOSIS — R6521 Severe sepsis with septic shock: Secondary | ICD-10-CM

## 2018-10-21 DIAGNOSIS — R7989 Other specified abnormal findings of blood chemistry: Secondary | ICD-10-CM

## 2018-10-21 DIAGNOSIS — N179 Acute kidney failure, unspecified: Secondary | ICD-10-CM

## 2018-10-21 DIAGNOSIS — K7290 Hepatic failure, unspecified without coma: Secondary | ICD-10-CM

## 2018-10-21 DIAGNOSIS — D696 Thrombocytopenia, unspecified: Secondary | ICD-10-CM

## 2018-10-21 DIAGNOSIS — K7200 Acute and subacute hepatic failure without coma: Secondary | ICD-10-CM

## 2018-10-21 LAB — BLOOD CULTURE ID PANEL (REFLEXED)
ACINETOBACTER BAUMANNII: NOT DETECTED
Candida albicans: NOT DETECTED
Candida glabrata: NOT DETECTED
Candida krusei: NOT DETECTED
Candida parapsilosis: NOT DETECTED
Candida tropicalis: NOT DETECTED
Enterobacter cloacae complex: NOT DETECTED
Enterobacteriaceae species: NOT DETECTED
Enterococcus species: NOT DETECTED
Escherichia coli: NOT DETECTED
Haemophilus influenzae: NOT DETECTED
Klebsiella oxytoca: NOT DETECTED
Klebsiella pneumoniae: NOT DETECTED
Listeria monocytogenes: NOT DETECTED
Methicillin resistance: NOT DETECTED
Neisseria meningitidis: NOT DETECTED
PSEUDOMONAS AERUGINOSA: NOT DETECTED
Proteus species: NOT DETECTED
SERRATIA MARCESCENS: NOT DETECTED
Staphylococcus aureus (BCID): DETECTED — AB
Staphylococcus species: DETECTED — AB
Streptococcus agalactiae: NOT DETECTED
Streptococcus pneumoniae: NOT DETECTED
Streptococcus pyogenes: NOT DETECTED
Streptococcus species: NOT DETECTED

## 2018-10-21 MED ORDER — HALOPERIDOL LACTATE 5 MG/ML IJ SOLN
5.0000 mg | Freq: Once | INTRAMUSCULAR | Status: DC
  Filled 2018-10-21: qty 1

## 2018-10-21 NOTE — Discharge Summary (Signed)
[facility] Discharge Summary  Patient name: Hunter Carter Medical record number: [id number] Date of birth: 18-Nov-1950 Age: 68 y.o. Gender: male Date of Admission: 10/20/2018  Date of Discharge: 10/21/18 Admitting Physician: No admitting provider for patient encounter.  Primary Care Provider: Garwin Brothers, MD Consultants: Palliative Care, CCM, Nephrology   Indication for Hospitalization: hypotension and AMS   Discharge Diagnoses/Problem List:  Sepsis Acute kidney failure CAD status post CABG CHF Hep C Hyperlipidemia HTN Electrolyte abnormalities Paroxysmal A. fib  Disposition: to hospice - [facility]   Discharge Condition: stable  Discharge Exam:  Gen: resting comfortably, NAD Cardio: RRR, no MRG Resp: CTAB GI: non distended Ext: non tender, no edema   Brief Hospital Course:  Hunter Carter is a 68 y.o. male who presented in sepsis with altered mental status.  Site of infection was unknown at time of admission.  Has a past medical history significant for hepatitis C and alcohol, CAD, CKD, CHF, dyslipidemia, second-degree AV block status post pacemaker.  At time of admission patient was hypotensive, hypothermic, and altered.  In the ED nephrology and CCM were consulted and spoke to family who decided patient would be transitioned to comfort care measures only and would like to pursue hospice care transfer.  Family does not have POA documentation but has been in conversation about wishes since last admission in November with palliative medicine and all 5 adult children were in agreement at time of admission.  At time of admission it was decided by family to place patient is comfort care measures.  Patient was in setting of multiorgan dysfunction and recent functional decline with previously stated wishes to continue palliative care only.  Cardiology was consulted and informed us that pacemaker would not need to be turned off.  Palliative care was  consulted but was unable to see patient before discharge.  Social work was consulted and was able to have placement at [facility], this was patient's family's request as they have a good history with this hospice center.  On date of admission primary team was notified that [facility] had a bed available.  It was determined that patient could be discharged with admission to [facility]. Discussed with day team attending, Dr. McDiarmid prior to discharge.   Issues for Follow Up:  1. Continue comfort care  Significant Procedures: none  Significant Labs and Imaging:  Recent Labs  Lab 10/20/18 1752 10/20/18 1808  WBC 12.7*  --   HGB 13.7 15.0  HCT 39.6 44.0  PLT PLATELET CLUMPS NOTED ON SMEAR, UNABLE TO ESTIMATE  --    Recent Labs  Lab 10/20/18 1752 10/20/18 1808 10/20/18 1840  NA 122* 122*  --   K 5.6* 5.6*  --   CL 93* 93*  --   CO2 16*  --   --   GLUCOSE 107* 100*  --   BUN 89* 79*  --   CREATININE 7.19* 7.20*  --   CALCIUM 7.5*  --   --   MG  --   --  2.1  ALKPHOS 84  --   --   AST 145*  --   --   ALT 82*  --   --   ALBUMIN 1.3*  --   --     Results/Tests Pending at Time of Discharge:  Unresulted Labs (From admission, onward)    Start     Ordered   10/20/18 1736  Urine culture  ONCE - STAT,   STAT  10/20/18 1736           Discharge Medications:  Allergies as of 10/21/2018   No Known Allergies     Medication List    STOP taking these medications   atorvastatin 10 MG tablet Commonly known as:  LIPITOR   ENSURE PLUS PO   escitalopram 10 MG tablet Commonly known as:  LEXAPRO   lactulose 10 GM/15ML solution Commonly known as:  CHRONULAC   losartan 25 MG tablet Commonly known as:  COZAAR   metoprolol tartrate 25 MG tablet Commonly known as:  LOPRESSOR   omeprazole 20 MG capsule Commonly known as:  PRILOSEC   PROAIR HFA 108 (90 Base) MCG/ACT inhaler Generic drug:  albuterol   promethazine 25 MG tablet Commonly known as:   PHENERGAN   promethazine 25 MG/ML injection Commonly known as:  PHENERGAN   rifaximin 550 MG Tabs tablet Commonly known as:  XIFAXAN   simethicone 125 MG chewable tablet Commonly known as:  MYLICON   spironolactone 25 MG tablet Commonly known as:  ALDACTONE   spironolactone 50 MG tablet Commonly known as:  ALDACTONE     TAKE these medications   furosemide 40 MG tablet Commonly known as:  LASIX Take 40 mg by mouth daily.   ondansetron 4 MG tablet Commonly known as:  ZOFRAN Take 4 mg by mouth every 6 (six) hours as needed for nausea or vomiting.       Discharge Instructions: Please refer to Patient Instructions section of EMR for full details.  Patient was counseled important signs and symptoms that should prompt return to medical care, changes in medications, dietary instructions, activity restrictions, and follow up appointments.   Follow-Up Appointments:   Caroline More, DO 10/21/2018, 9:29 AM PGY-2, [place] [facility]

## 2018-10-21 NOTE — Progress Notes (Signed)
CSW acknowledges consultfor residential hospice placement. CSW spoke with pt's daughter at bedside and  was informed that they are interested in [facility]. CSW has reached out to [place] with [facility] to see if beds are available at this time.    CSW, MD, and RN all met in the room with pt's family to discuss plan of care. CSW made aware that pt's blood pressure is low at this time which may impact the ability to transport pt to the facility if accepted. MD did express that if pt is unable to be transported then pt would be placed on palliative unit (6N). CSW has already spoken with Harmon Pier from [place] and provided her with pt's daughter Sirena's number at this time. Harmon Pier to follow back up with CSW as needed.       Virgie Dad Eliseo Withers, MSW, [place] [facility] Clinical Social Worker [phone]

## 2018-10-21 NOTE — Progress Notes (Addendum)
9:35am-CSW spoke with MD and was informed that pt is safe to transport to [facility] today. CSW has arranged transport for 10 am. Harmon Pier at bedside doing paperwork with family.   There are no further CSW needs, CSW will sign off.   CSW spoke with Harmon Pier from [place] and was informed that they can take pt today. CSW spoke with MD and was informed that she would call CSW back to give update on pt being stable enough for transport. CSW to arrange transport once MD has given update.    Virgie Dad Hunter Carter, MSW, [place] [facility] Clinical Social Worker [phone]

## 2018-10-21 NOTE — ED Notes (Signed)
Admitting MD aware of drain not being present from right abd.

## 2018-10-21 NOTE — ED Notes (Signed)
bfast tray ordered 

## 2018-10-21 NOTE — ED Notes (Signed)
Admitting MD in w/pt and family. Advised will contact cardiology to turn off pacemaker as pt is comfort care if not already done.

## 2018-10-21 NOTE — ED Notes (Addendum)
Morphine given as per family request for transport to [facility] and pt noted to be restless. Family leaving at this time. [facility] arrived and will transport pt to [facility]. Yellow DNR given to [facility]. Pt unable to sign for d/c paperwork.

## 2018-10-21 NOTE — ED Notes (Addendum)
Pt arrived to Rm 48 - pt will open eyes - no verbal response. Pt moved to bed by staff x 3. Pt noted w/foley catheter draining dark colored urine. Pt's drain from right abd noted to be lying on bed. Paging Admitting MD to advise. Family members x 3 at bedside. DNR paperwork at bedside.

## 2018-10-21 NOTE — Progress Notes (Signed)
[facility]) Received request from [place] for family interest in [facility]. Chart reviewed and met with daughter, sister-in-law and daughter-in-law at bedside to complete paperwork for transfer today. CSW [place] aware. Appreciate help from bedside RN!  Discharge summary routed.   RN given number to call report [phone].  Thank you,  Erling Conte, LCSW [phone]

## 2018-10-23 LAB — CULTURE, BLOOD (ROUTINE X 2)

## 2018-10-28 ENCOUNTER — Inpatient Hospital Stay: Admission: RE | Admit: 2018-10-28 | Payer: Medicare Other | Source: Ambulatory Visit

## 2018-10-28 ENCOUNTER — Other Ambulatory Visit: Payer: Medicare Other

## 2018-10-31 ENCOUNTER — Encounter: Payer: Self-pay | Admitting: Cardiology

## 2018-11-15 DEATH — deceased

## 2019-11-13 NOTE — Telephone Encounter (Signed)
Close this encounter, pt passed away

## 2020-06-09 IMAGING — DX DG CHEST 2V
2 series · 2 of 2 positions shown · non-contrast
Comparison: 09/02/2018

CLINICAL DATA: Abdominal pain and weakness for a few days. Recent
discharge from [HOSPITAL] after hospital stay.

EXAM:
CHEST - 2 VIEW

[chest lat]
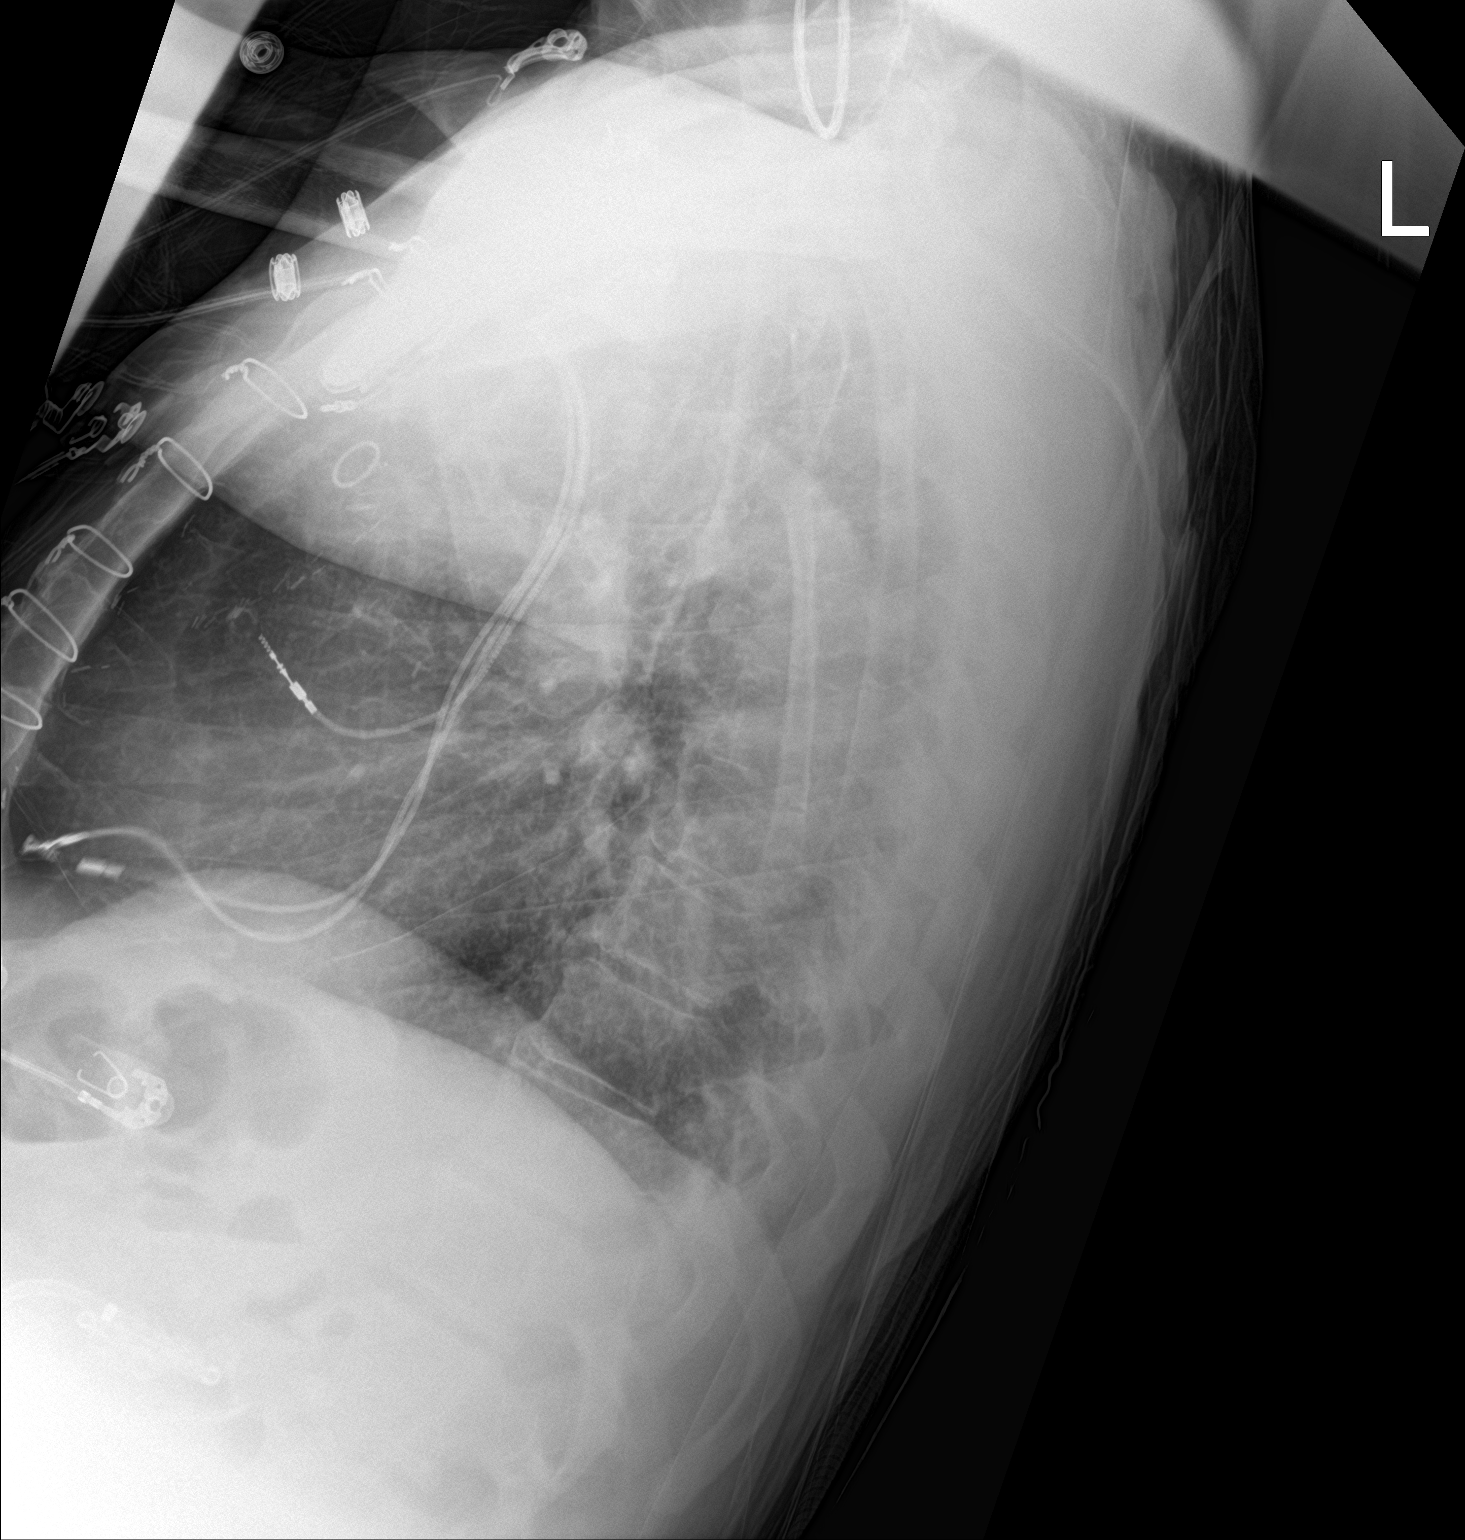

[chest ap]
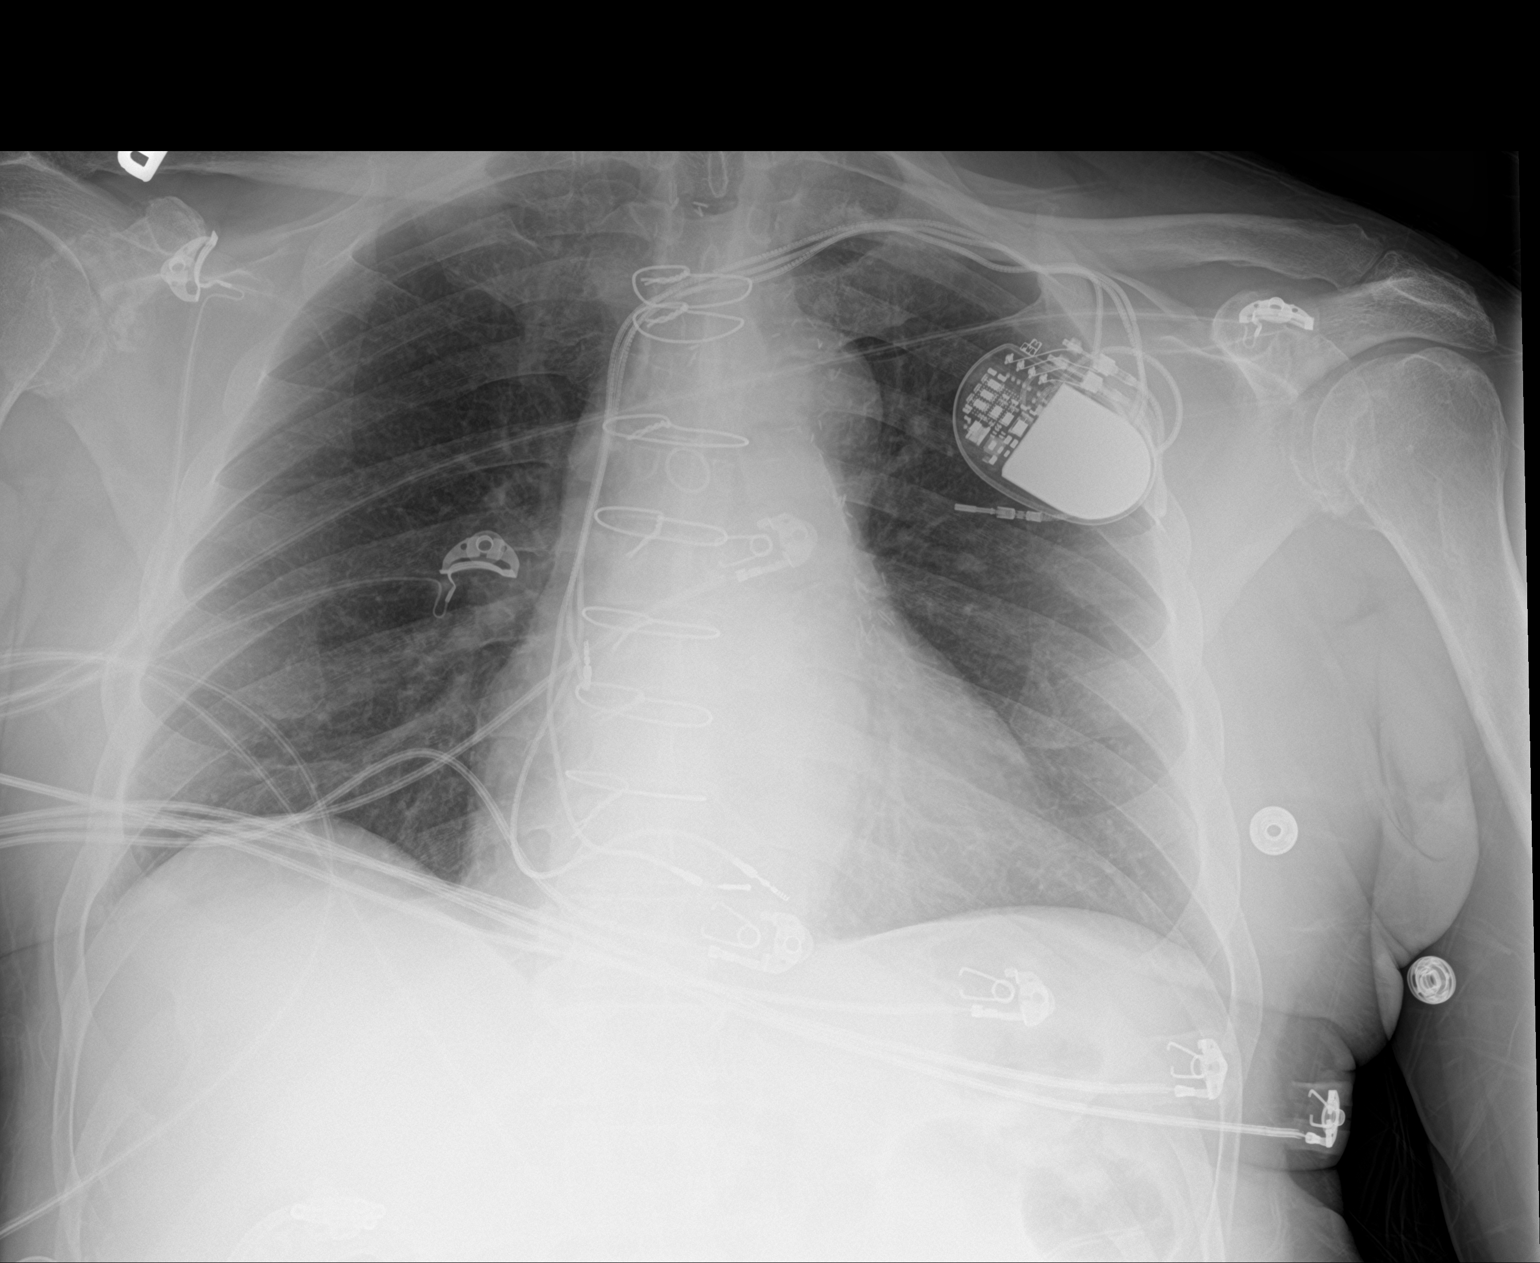

[2 of 2 positions shown; findings below may reference images not displayed]

FINDINGS: Postoperative changes in the mediastinum. Cardiac pacemaker. Shallow
inspiration. Heart size and pulmonary vascularity are normal. Lungs
are clear. No airspace disease or consolidation. No blunting of
costophrenic angles. No pneumothorax. Mediastinal contours appear
intact. Degenerative changes in the spine and shoulders.
IMPRESSION: Shallow inspiration. No evidence of active pulmonary disease.

## 2020-06-09 IMAGING — CT CT ABD-PELV W/O CM
2 of 4 series · 15 of 46 positions shown, 17 images · non-contrast
Comparison: CT abdomen pelvis report dated September 01, 2018.

Addendum:
CLINICAL DATA: Abdominal pain and weakness. History of cirrhosis
and renal failure.

EXAM:
CT ABDOMEN AND PELVIS WITHOUT CONTRAST
TECHNIQUE: Multidetector CT imaging of the abdomen and pelvis was performed
following the standard protocol without IV contrast.

[Series 3: a/p w/o 5mm · axial · non-contrast · 0.89mm/px · z∈[+916,+1301]mm · 12 of 91 slices shown, 14 images]
[im 7/91  soft-tissue]
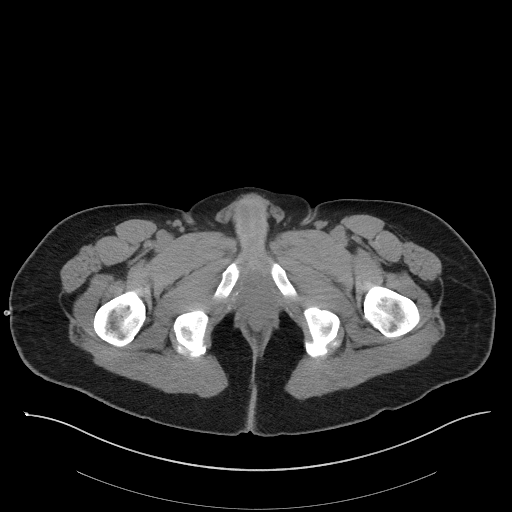
[im 7/91  bone]
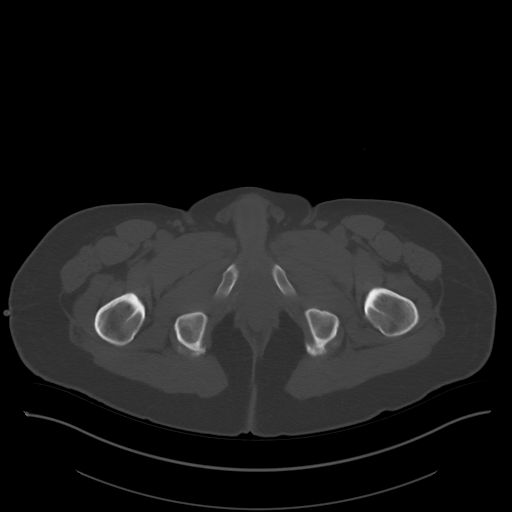
[im 14/91  soft-tissue]
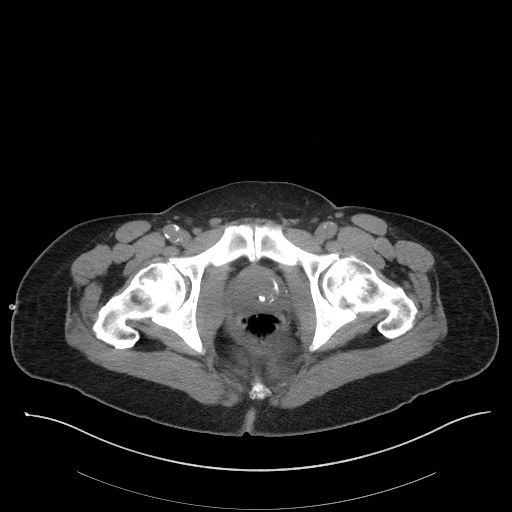
[im 21/91  soft-tissue]
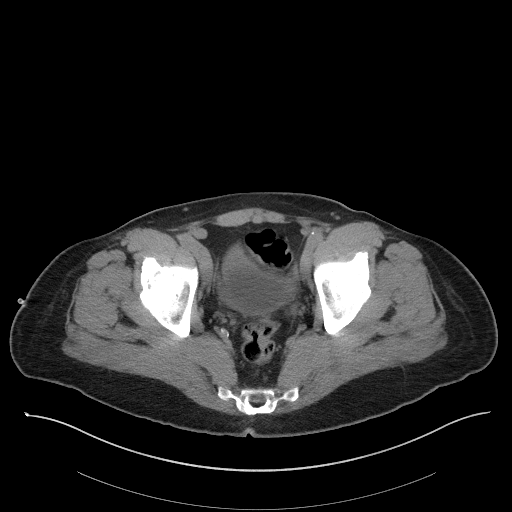
[im 28/91  soft-tissue]
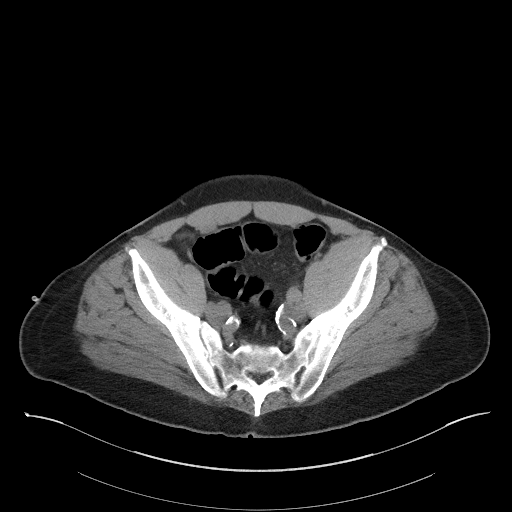
[im 35/91  soft-tissue]
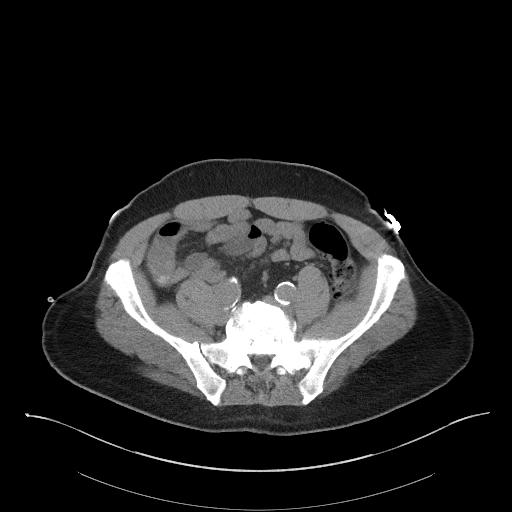
[im 42/91  soft-tissue]
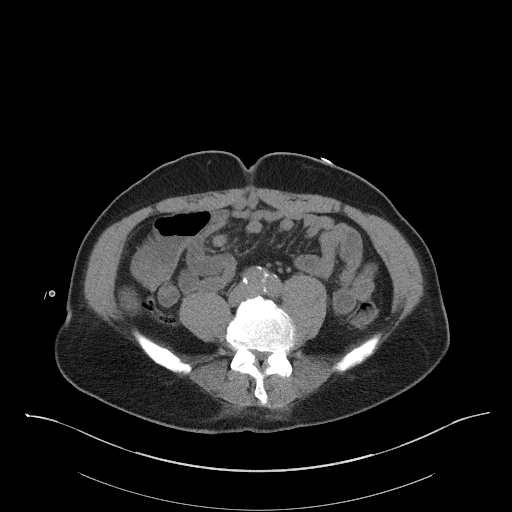
[im 49/91  soft-tissue]
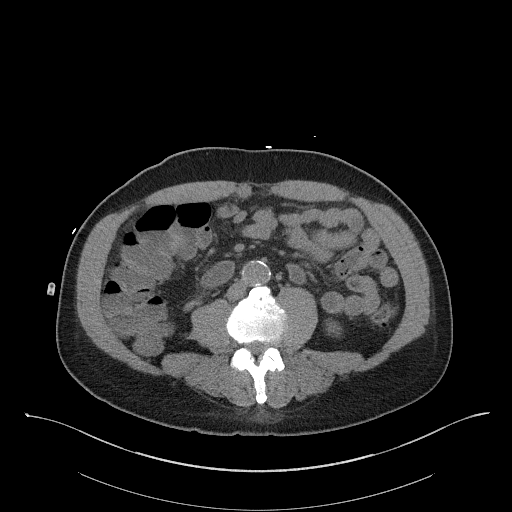
[im 56/91  soft-tissue]
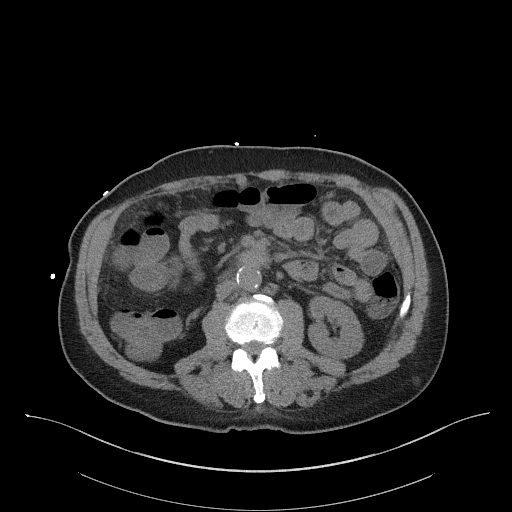
[im 63/91  soft-tissue]
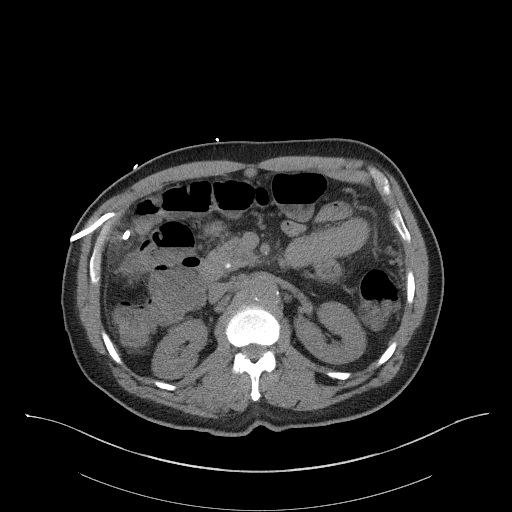
[im 63/91  bone]
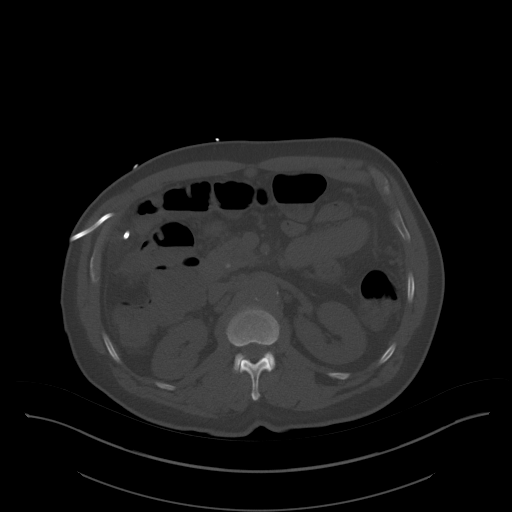
[im 70/91  soft-tissue]
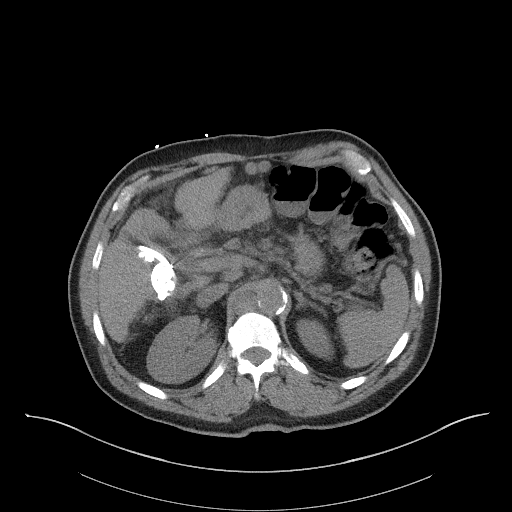
[im 77/91  soft-tissue]
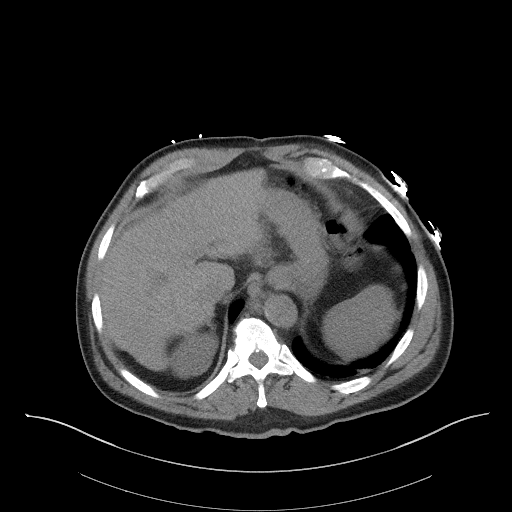
[im 84/91  soft-tissue]
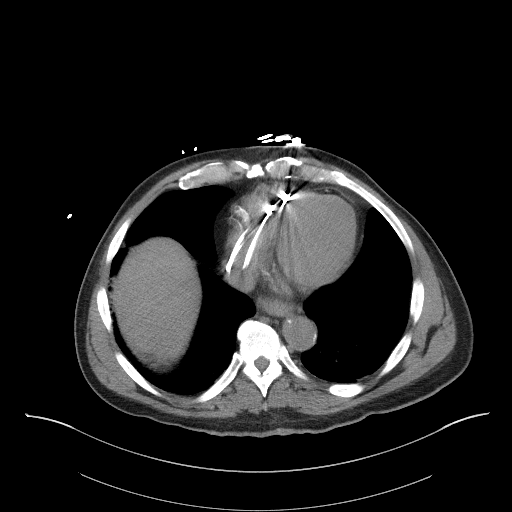

[Series 6: a/p w/o cor · coronal · non-contrast · 0.80mm/px · 3 of 131 slices shown]
[im 44/131  soft-tissue]
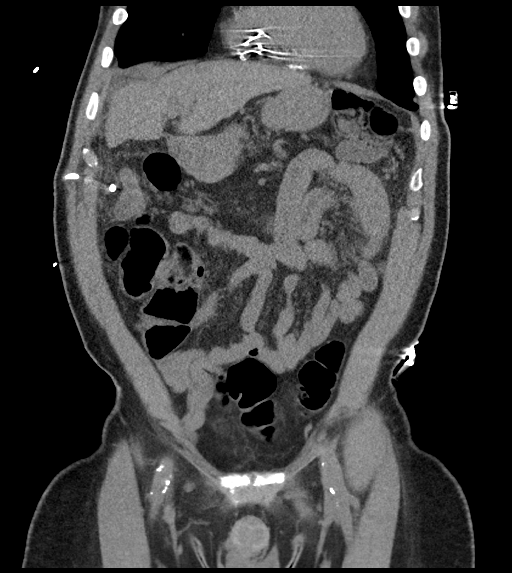
[im 58/131  soft-tissue]
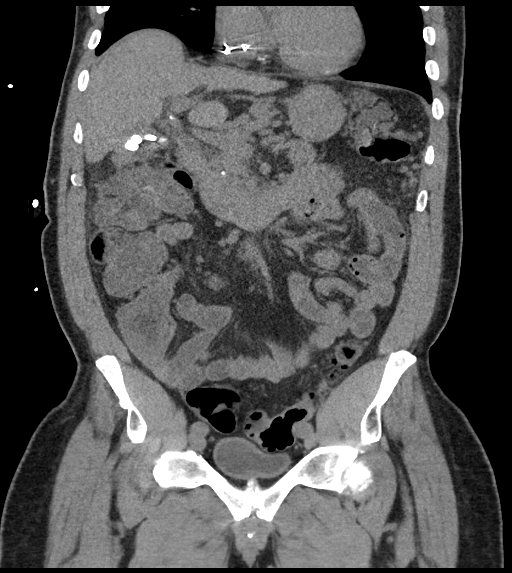
[im 73/131  soft-tissue]
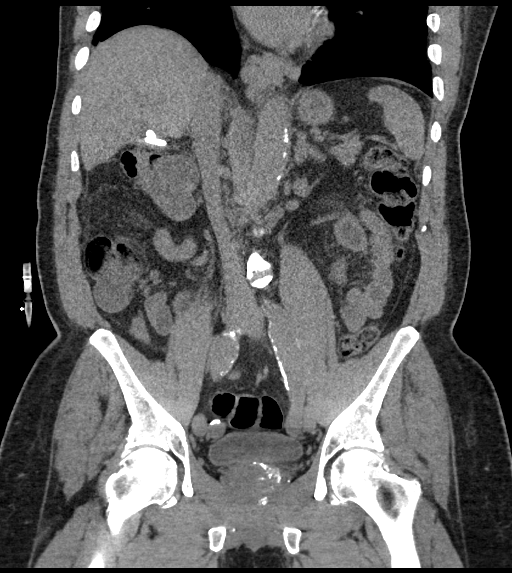

[15 of 46 positions shown; findings below may reference images not displayed]

FINDINGS: Lower chest: No acute abnormality.

Hepatobiliary: Cirrhotic liver. No focal liver abnormality.
Percutaneous cholecystostomy tube in place. Multiple gallstones. The
gallbladder is decompressed with moderate circumferential wall
thickening. There is a 7 mm stone in the distal common bile duct.
Mild common bile duct dilatation, measuring up to 9 mm.

Pancreas: Unremarkable. No pancreatic ductal dilatation or
surrounding inflammatory changes.

Spleen: Normal in size without focal abnormality.

Adrenals/Urinary Tract: Adrenal glands are unremarkable. Kidneys are
normal, without renal calculi, focal lesion, or hydronephrosis.
Bladder is unremarkable.

Stomach/Bowel: Stomach is within normal limits. Appendix appears
normal. No evidence of bowel wall thickening, distention, or
inflammatory changes.

Vascular/Lymphatic: Large esophageal varices and recannulized
paraumbilical vein. Aortic atherosclerosis with ectatic bilateral
common iliac arteries. Prominent porta hepatis and peripancreatic
lymph nodes.

Reproductive: Unchanged prostatomegaly.

Other: No free fluid or pneumoperitoneum.

Musculoskeletal: No acute or significant osseous findings.
IMPRESSION: 1. Choledocholithiasis with mild common bile duct dilatation. Based
on the prior CT abdomen pelvis report from 09/01/2018, this is a new
finding. Images are not currently available due to PACS downtime.
Recommend ERCP for further evaluation
2. Percutaneous cholecystostomy tube remains in place with unchanged
cholelithiasis. The gallbladder is largely decompressed. Minimal
residual pericholecystic inflammatory changes.
3. Cirrhosis with sequelae of portal hypertension including
esophageal varices and recannulized paraumbilical vein.
4.  Aortic atherosclerosis ([id number]).

This exam was interpreted during a PACS downtime with limited
availability of comparison cases. It has been flagged for review
following the downtime. If clinically indicated after this review,
an addendum will be issued providing details about comparison to
prior imaging.

ADDENDUM:
The current study can now be compared to the most recent CT from
09/01/2018.

The gallstone now in the distal common bile duct was previously seen
in the region of the cystic duct. There was no biliary dilatation at
that time.

The pericholecystic inflammatory changes have significantly
improved.

Previously seen small ascites and bilateral pleural effusions have
resolved.

*** End of Addendum ***

## 2020-06-16 NOTE — Telephone Encounter (Signed)
Close this encounter
